# Patient Record
Sex: Female | Born: 1970
Health system: Southern US, Community
[De-identification: ages and names within clinical notes are randomized; demographics above are authoritative.]

## PROBLEM LIST (undated history)

## (undated) DIAGNOSIS — D649 Anemia, unspecified: Secondary | ICD-10-CM

## (undated) DIAGNOSIS — IMO0001 Reserved for inherently not codable concepts without codable children: Secondary | ICD-10-CM

## (undated) DIAGNOSIS — F419 Anxiety disorder, unspecified: Secondary | ICD-10-CM

## (undated) DIAGNOSIS — I10 Essential (primary) hypertension: Secondary | ICD-10-CM

## (undated) DIAGNOSIS — R011 Cardiac murmur, unspecified: Secondary | ICD-10-CM

## (undated) DIAGNOSIS — T7840XA Allergy, unspecified, initial encounter: Secondary | ICD-10-CM

## (undated) DIAGNOSIS — N809 Endometriosis, unspecified: Secondary | ICD-10-CM

## (undated) DIAGNOSIS — G709 Myoneural disorder, unspecified: Secondary | ICD-10-CM

## (undated) DIAGNOSIS — H9190 Unspecified hearing loss, unspecified ear: Secondary | ICD-10-CM

## (undated) DIAGNOSIS — R609 Edema, unspecified: Secondary | ICD-10-CM

## (undated) DIAGNOSIS — G4730 Sleep apnea, unspecified: Secondary | ICD-10-CM

## (undated) DIAGNOSIS — E785 Hyperlipidemia, unspecified: Secondary | ICD-10-CM

## (undated) DIAGNOSIS — E139 Other specified diabetes mellitus without complications: Secondary | ICD-10-CM

## (undated) DIAGNOSIS — G809 Cerebral palsy, unspecified: Secondary | ICD-10-CM

## (undated) DIAGNOSIS — F329 Major depressive disorder, single episode, unspecified: Secondary | ICD-10-CM

## (undated) DIAGNOSIS — C183 Malignant neoplasm of hepatic flexure: Secondary | ICD-10-CM

## (undated) DIAGNOSIS — F32A Depression, unspecified: Secondary | ICD-10-CM

## (undated) DIAGNOSIS — F259 Schizoaffective disorder, unspecified: Secondary | ICD-10-CM

## (undated) DIAGNOSIS — R0600 Dyspnea, unspecified: Secondary | ICD-10-CM

## (undated) DIAGNOSIS — E041 Nontoxic single thyroid nodule: Secondary | ICD-10-CM

## (undated) HISTORY — DX: Cerebral palsy, unspecified: G80.9

## (undated) HISTORY — DX: Allergy, unspecified, initial encounter: T78.40XA

## (undated) HISTORY — DX: Other specified diabetes mellitus without complications: E13.9

## (undated) HISTORY — DX: Unspecified hearing loss, unspecified ear: H91.90

## (undated) HISTORY — DX: Hyperlipidemia, unspecified: E78.5

## (undated) HISTORY — DX: Reserved for inherently not codable concepts without codable children: IMO0001

## (undated) HISTORY — DX: Endometriosis, unspecified: N80.9

## (undated) HISTORY — PX: EXPLORATORY LAPAROTOMY WITH ABDOMINAL MASS EXCISION: SHX5169

## (undated) HISTORY — DX: Essential (primary) hypertension: I10

---
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
## 1998-01-14 ENCOUNTER — Inpatient Hospital Stay (HOSPITAL_COMMUNITY): Admission: AD | Admit: 1998-01-14 | Discharge: 1998-01-14 | Payer: Self-pay | Admitting: *Deleted

## 1998-02-19 ENCOUNTER — Ambulatory Visit (HOSPITAL_COMMUNITY): Admission: RE | Admit: 1998-02-19 | Discharge: 1998-02-19 | Payer: Self-pay | Admitting: *Deleted

## 1998-03-19 ENCOUNTER — Ambulatory Visit (HOSPITAL_COMMUNITY): Admission: RE | Admit: 1998-03-19 | Discharge: 1998-03-19 | Payer: Self-pay | Admitting: *Deleted

## 1998-04-08 ENCOUNTER — Inpatient Hospital Stay (HOSPITAL_COMMUNITY): Admission: AD | Admit: 1998-04-08 | Discharge: 1998-04-08 | Payer: Self-pay | Admitting: *Deleted

## 1998-04-16 ENCOUNTER — Inpatient Hospital Stay (HOSPITAL_COMMUNITY): Admission: AD | Admit: 1998-04-16 | Discharge: 1998-04-16 | Payer: Self-pay | Admitting: *Deleted

## 1998-04-20 ENCOUNTER — Observation Stay (HOSPITAL_COMMUNITY): Admission: AD | Admit: 1998-04-20 | Discharge: 1998-04-20 | Payer: Self-pay | Admitting: *Deleted

## 1998-05-31 ENCOUNTER — Inpatient Hospital Stay (HOSPITAL_COMMUNITY): Admission: AD | Admit: 1998-05-31 | Discharge: 1998-05-31 | Payer: Self-pay | Admitting: *Deleted

## 1998-06-11 ENCOUNTER — Ambulatory Visit (HOSPITAL_COMMUNITY): Admission: RE | Admit: 1998-06-11 | Discharge: 1998-06-11 | Payer: Self-pay | Admitting: *Deleted

## 1998-08-01 ENCOUNTER — Inpatient Hospital Stay (HOSPITAL_COMMUNITY): Admission: AD | Admit: 1998-08-01 | Discharge: 1998-08-01 | Payer: Self-pay | Admitting: *Deleted

## 1998-09-15 ENCOUNTER — Inpatient Hospital Stay (HOSPITAL_COMMUNITY): Admission: AD | Admit: 1998-09-15 | Discharge: 1998-09-19 | Payer: Self-pay | Admitting: *Deleted

## 1998-10-27 ENCOUNTER — Inpatient Hospital Stay (HOSPITAL_COMMUNITY): Admission: AD | Admit: 1998-10-27 | Discharge: 1998-10-27 | Payer: Self-pay | Admitting: *Deleted

## 1998-12-16 ENCOUNTER — Emergency Department (HOSPITAL_COMMUNITY): Admission: EM | Admit: 1998-12-16 | Discharge: 1998-12-16 | Payer: Self-pay | Admitting: Emergency Medicine

## 1999-01-02 ENCOUNTER — Encounter: Admission: RE | Admit: 1999-01-02 | Discharge: 1999-02-04 | Payer: Self-pay | Admitting: Orthopedic Surgery

## 2001-02-07 ENCOUNTER — Ambulatory Visit (HOSPITAL_COMMUNITY): Admission: RE | Admit: 2001-02-07 | Discharge: 2001-02-07 | Payer: Self-pay | Admitting: Cardiology

## 2001-02-07 ENCOUNTER — Encounter: Payer: Self-pay | Admitting: Cardiology

## 2001-02-09 ENCOUNTER — Encounter: Payer: Self-pay | Admitting: Cardiology

## 2001-02-09 ENCOUNTER — Ambulatory Visit (HOSPITAL_COMMUNITY): Admission: RE | Admit: 2001-02-09 | Discharge: 2001-02-09 | Payer: Self-pay | Admitting: Cardiology

## 2001-11-29 ENCOUNTER — Ambulatory Visit (HOSPITAL_COMMUNITY): Admission: RE | Admit: 2001-11-29 | Discharge: 2001-11-29 | Payer: Self-pay | Admitting: Cardiology

## 2001-11-29 ENCOUNTER — Encounter: Payer: Self-pay | Admitting: Cardiology

## 2002-10-23 ENCOUNTER — Inpatient Hospital Stay (HOSPITAL_COMMUNITY): Admission: AD | Admit: 2002-10-23 | Discharge: 2002-10-23 | Payer: Self-pay | Admitting: *Deleted

## 2003-09-23 ENCOUNTER — Emergency Department (HOSPITAL_COMMUNITY): Admission: EM | Admit: 2003-09-23 | Discharge: 2003-09-23 | Payer: Self-pay | Admitting: *Deleted

## 2003-10-28 ENCOUNTER — Emergency Department (HOSPITAL_COMMUNITY): Admission: AD | Admit: 2003-10-28 | Discharge: 2003-10-28 | Payer: Self-pay | Admitting: Family Medicine

## 2004-11-17 ENCOUNTER — Ambulatory Visit: Payer: Self-pay | Admitting: Family Medicine

## 2004-11-24 ENCOUNTER — Ambulatory Visit: Payer: Self-pay | Admitting: Family Medicine

## 2004-12-01 ENCOUNTER — Ambulatory Visit: Payer: Self-pay | Admitting: Family Medicine

## 2005-01-12 ENCOUNTER — Encounter: Admission: RE | Admit: 2005-01-12 | Discharge: 2005-01-12 | Payer: Self-pay | Admitting: Otolaryngology

## 2005-02-04 ENCOUNTER — Ambulatory Visit: Payer: Self-pay | Admitting: Family Medicine

## 2005-02-08 ENCOUNTER — Ambulatory Visit: Payer: Self-pay | Admitting: Family Medicine

## 2005-12-07 ENCOUNTER — Ambulatory Visit: Payer: Self-pay | Admitting: Family Medicine

## 2006-01-17 ENCOUNTER — Ambulatory Visit: Payer: Self-pay | Admitting: Family Medicine

## 2006-01-19 ENCOUNTER — Ambulatory Visit (HOSPITAL_COMMUNITY): Admission: RE | Admit: 2006-01-19 | Discharge: 2006-01-19 | Payer: Self-pay | Admitting: Internal Medicine

## 2007-04-06 ENCOUNTER — Ambulatory Visit: Payer: Self-pay | Admitting: Internal Medicine

## 2007-04-18 ENCOUNTER — Encounter (INDEPENDENT_AMBULATORY_CARE_PROVIDER_SITE_OTHER): Payer: Self-pay | Admitting: Family Medicine

## 2007-04-18 ENCOUNTER — Ambulatory Visit: Payer: Self-pay | Admitting: Internal Medicine

## 2007-09-26 ENCOUNTER — Encounter (INDEPENDENT_AMBULATORY_CARE_PROVIDER_SITE_OTHER): Payer: Self-pay | Admitting: Family Medicine

## 2007-09-26 DIAGNOSIS — N809 Endometriosis, unspecified: Secondary | ICD-10-CM | POA: Insufficient documentation

## 2007-09-26 DIAGNOSIS — R011 Cardiac murmur, unspecified: Secondary | ICD-10-CM

## 2007-09-26 DIAGNOSIS — F8189 Other developmental disorders of scholastic skills: Secondary | ICD-10-CM

## 2007-09-26 DIAGNOSIS — N6019 Diffuse cystic mastopathy of unspecified breast: Secondary | ICD-10-CM

## 2008-07-15 ENCOUNTER — Other Ambulatory Visit: Admission: RE | Admit: 2008-07-15 | Discharge: 2008-07-15 | Payer: Self-pay | Admitting: Family Medicine

## 2008-07-15 ENCOUNTER — Encounter (INDEPENDENT_AMBULATORY_CARE_PROVIDER_SITE_OTHER): Payer: Self-pay | Admitting: Family Medicine

## 2008-07-15 ENCOUNTER — Ambulatory Visit: Payer: Self-pay | Admitting: Internal Medicine

## 2008-07-16 ENCOUNTER — Encounter (INDEPENDENT_AMBULATORY_CARE_PROVIDER_SITE_OTHER): Payer: Self-pay | Admitting: Internal Medicine

## 2009-09-02 ENCOUNTER — Telehealth (INDEPENDENT_AMBULATORY_CARE_PROVIDER_SITE_OTHER): Payer: Self-pay | Admitting: *Deleted

## 2009-09-16 ENCOUNTER — Encounter (INDEPENDENT_AMBULATORY_CARE_PROVIDER_SITE_OTHER): Payer: Self-pay | Admitting: Adult Health

## 2009-09-16 ENCOUNTER — Ambulatory Visit: Payer: Self-pay | Admitting: Internal Medicine

## 2009-09-16 LAB — CONVERTED CEMR LAB
ALT: 12 units/L (ref 0–35)
CO2: 25 meq/L (ref 19–32)
Calcium: 9.2 mg/dL (ref 8.4–10.5)
Chloride: 103 meq/L (ref 96–112)
Creatinine, Ser: 0.71 mg/dL (ref 0.40–1.20)
Eosinophils Absolute: 0.1 10*3/uL (ref 0.0–0.7)
Eosinophils Relative: 2 % (ref 0–5)
Glucose, Bld: 94 mg/dL (ref 70–99)
HCT: 41.7 % (ref 36.0–46.0)
Hemoglobin: 13.5 g/dL (ref 12.0–15.0)
Lymphs Abs: 2.1 10*3/uL (ref 0.7–4.0)
MCHC: 32.4 g/dL (ref 30.0–36.0)
MCV: 85.6 fL (ref 78.0–100.0)
Monocytes Absolute: 0.6 10*3/uL (ref 0.1–1.0)
Monocytes Relative: 10 % (ref 3–12)
Neutrophils Relative %: 55 % (ref 43–77)
RBC: 4.87 M/uL (ref 3.87–5.11)
TSH: 0.464 microintl units/mL (ref 0.350–4.500)
Total Protein: 7 g/dL (ref 6.0–8.3)
WBC: 6.3 10*3/uL (ref 4.0–10.5)

## 2009-10-14 ENCOUNTER — Encounter (INDEPENDENT_AMBULATORY_CARE_PROVIDER_SITE_OTHER): Payer: Self-pay | Admitting: Adult Health

## 2009-10-14 ENCOUNTER — Ambulatory Visit: Payer: Self-pay | Admitting: Internal Medicine

## 2009-10-14 ENCOUNTER — Other Ambulatory Visit: Admission: RE | Admit: 2009-10-14 | Discharge: 2009-10-14 | Payer: Self-pay | Admitting: Internal Medicine

## 2009-10-14 LAB — CONVERTED CEMR LAB
Albumin: 4.3 g/dL (ref 3.5–5.2)
BUN: 7 mg/dL (ref 6–23)
Calcium: 9.3 mg/dL (ref 8.4–10.5)
Chlamydia, DNA Probe: NEGATIVE
Chloride: 101 meq/L (ref 96–112)
Creatinine, Ser: 0.91 mg/dL (ref 0.40–1.20)
Glucose, Bld: 104 mg/dL — ABNORMAL HIGH (ref 70–99)
Potassium: 4.4 meq/L (ref 3.5–5.3)

## 2009-10-21 ENCOUNTER — Ambulatory Visit: Payer: Self-pay | Admitting: Family Medicine

## 2009-11-05 ENCOUNTER — Encounter (INDEPENDENT_AMBULATORY_CARE_PROVIDER_SITE_OTHER): Payer: Self-pay | Admitting: *Deleted

## 2009-11-21 ENCOUNTER — Encounter (INDEPENDENT_AMBULATORY_CARE_PROVIDER_SITE_OTHER): Payer: Self-pay | Admitting: Adult Health

## 2009-11-21 ENCOUNTER — Ambulatory Visit: Payer: Self-pay | Admitting: Internal Medicine

## 2009-11-21 LAB — CONVERTED CEMR LAB
ALT: 8 units/L (ref 0–35)
AST: 11 units/L (ref 0–37)
Albumin: 4 g/dL (ref 3.5–5.2)
Alkaline Phosphatase: 54 units/L (ref 39–117)
Hgb A1c MFr Bld: 6.3 % — ABNORMAL HIGH (ref 4.6–6.1)
LDL Cholesterol: 88 mg/dL (ref 0–99)
Potassium: 3.9 meq/L (ref 3.5–5.3)
Sodium: 139 meq/L (ref 135–145)
Total Protein: 6.6 g/dL (ref 6.0–8.3)

## 2009-12-03 ENCOUNTER — Ambulatory Visit (HOSPITAL_COMMUNITY): Admission: RE | Admit: 2009-12-03 | Discharge: 2009-12-03 | Payer: Self-pay | Admitting: Internal Medicine

## 2010-05-22 ENCOUNTER — Ambulatory Visit: Payer: Self-pay | Admitting: Internal Medicine

## 2010-05-22 ENCOUNTER — Encounter (INDEPENDENT_AMBULATORY_CARE_PROVIDER_SITE_OTHER): Payer: Self-pay | Admitting: Adult Health

## 2010-05-22 LAB — CONVERTED CEMR LAB
AST: 15 units/L (ref 0–37)
Albumin: 4 g/dL (ref 3.5–5.2)
Alkaline Phosphatase: 56 units/L (ref 39–117)
BUN: 8 mg/dL (ref 6–23)
HDL: 56 mg/dL (ref >39)
LDL Cholesterol: 95 mg/dL (ref 0–99)
Potassium: 4.3 meq/L (ref 3.5–5.3)
Sodium: 137 meq/L (ref 135–145)
Total Protein: 6.9 g/dL (ref 6.0–8.3)
Vit D, 25-Hydroxy: 31 ng/mL (ref 30–89)

## 2010-08-27 ENCOUNTER — Emergency Department (HOSPITAL_COMMUNITY): Admission: EM | Admit: 2010-08-27 | Discharge: 2010-08-27 | Payer: Self-pay | Admitting: Emergency Medicine

## 2010-08-29 ENCOUNTER — Other Ambulatory Visit: Payer: Self-pay | Admitting: Emergency Medicine

## 2010-08-30 ENCOUNTER — Other Ambulatory Visit: Payer: Self-pay | Admitting: Emergency Medicine

## 2010-08-30 ENCOUNTER — Inpatient Hospital Stay (HOSPITAL_COMMUNITY): Admission: RE | Admit: 2010-08-30 | Discharge: 2010-09-02 | Payer: Self-pay | Admitting: Psychiatry

## 2010-08-30 ENCOUNTER — Ambulatory Visit: Payer: Self-pay | Admitting: Psychiatry

## 2010-09-08 ENCOUNTER — Encounter (INDEPENDENT_AMBULATORY_CARE_PROVIDER_SITE_OTHER): Payer: Self-pay | Admitting: Internal Medicine

## 2010-09-08 LAB — CONVERTED CEMR LAB
Albumin: 4.3 g/dL (ref 3.5–5.2)
CO2: 24 meq/L (ref 19–32)
Calcium: 9.3 mg/dL (ref 8.4–10.5)
Chloride: 101 meq/L (ref 96–112)
Eosinophils Relative: 1 % (ref 0–5)
Glucose, Bld: 111 mg/dL — ABNORMAL HIGH (ref 70–99)
HCT: 43.1 % (ref 36.0–46.0)
Helicobacter Pylori Antibody-IgG: 0.5
Lipase: 10 units/L (ref 0–75)
Lymphocytes Relative: 37 % (ref 12–46)
Lymphs Abs: 0.9 10*3/uL (ref 0.7–4.0)
Platelets: 232 10*3/uL (ref 150–400)
Potassium: 4.3 meq/L (ref 3.5–5.3)
Sodium: 139 meq/L (ref 135–145)
Total Protein: 7.2 g/dL (ref 6.0–8.3)
WBC: 2.5 10*3/uL — ABNORMAL LOW (ref 4.0–10.5)

## 2010-09-09 ENCOUNTER — Encounter (INDEPENDENT_AMBULATORY_CARE_PROVIDER_SITE_OTHER): Payer: Self-pay | Admitting: Internal Medicine

## 2010-09-14 ENCOUNTER — Encounter (INDEPENDENT_AMBULATORY_CARE_PROVIDER_SITE_OTHER): Payer: Self-pay | Admitting: *Deleted

## 2010-09-14 LAB — CONVERTED CEMR LAB
Basophils Absolute: 0 10*3/uL (ref 0.0–0.1)
Basophils Relative: 1 % (ref 0–1)
Lymphocytes Relative: 38 % (ref 12–46)
Neutro Abs: 2.1 10*3/uL (ref 1.7–7.7)
Platelets: 191 10*3/uL (ref 150–400)
RDW: 13.2 % (ref 11.5–15.5)
Valproic Acid Lvl: 5.9 ug/mL — ABNORMAL LOW (ref 50.0–100.0)

## 2010-09-15 ENCOUNTER — Ambulatory Visit (HOSPITAL_COMMUNITY): Payer: Self-pay | Admitting: Psychiatry

## 2010-09-22 ENCOUNTER — Emergency Department (HOSPITAL_COMMUNITY): Admission: EM | Admit: 2010-09-22 | Discharge: 2010-09-22 | Payer: Self-pay | Admitting: Emergency Medicine

## 2010-09-23 ENCOUNTER — Other Ambulatory Visit: Payer: Self-pay | Admitting: Emergency Medicine

## 2010-09-23 ENCOUNTER — Inpatient Hospital Stay (HOSPITAL_COMMUNITY): Admission: EM | Admit: 2010-09-23 | Discharge: 2010-09-27 | Payer: Self-pay | Admitting: Psychiatry

## 2010-10-27 ENCOUNTER — Emergency Department (HOSPITAL_COMMUNITY)
Admission: EM | Admit: 2010-10-27 | Discharge: 2010-10-28 | Disposition: A | Discharge status: Other Acute Inpatient Hospital | Payer: Self-pay | Source: Home / Self Care | Admitting: Emergency Medicine

## 2010-10-28 ENCOUNTER — Inpatient Hospital Stay (HOSPITAL_COMMUNITY)
Admission: EM | Admit: 2010-10-28 | Discharge: 2010-11-02 | Payer: Self-pay | Source: Home / Self Care | Attending: Psychiatry | Admitting: Psychiatry

## 2010-11-02 ENCOUNTER — Ambulatory Visit (HOSPITAL_COMMUNITY): Payer: Self-pay | Admitting: Psychiatry

## 2010-11-19 ENCOUNTER — Ambulatory Visit (HOSPITAL_COMMUNITY)
Admission: RE | Admit: 2010-11-19 | Discharge: 2010-11-19 | Payer: Self-pay | Source: Home / Self Care | Attending: Psychiatry | Admitting: Psychiatry

## 2010-12-03 ENCOUNTER — Ambulatory Visit (HOSPITAL_COMMUNITY): Admit: 2010-12-03 | Payer: Self-pay | Admitting: Psychiatry

## 2010-12-04 ENCOUNTER — Emergency Department (HOSPITAL_COMMUNITY)
Admission: EM | Admit: 2010-12-04 | Discharge: 2010-12-07 | Disposition: A | Discharge status: Other Acute Inpatient Hospital | Payer: Self-pay | Source: Home / Self Care | Admitting: Emergency Medicine

## 2010-12-05 DIAGNOSIS — F259 Schizoaffective disorder, unspecified: Secondary | ICD-10-CM

## 2010-12-06 ENCOUNTER — Encounter: Payer: Self-pay | Admitting: Internal Medicine

## 2010-12-06 DIAGNOSIS — F259 Schizoaffective disorder, unspecified: Secondary | ICD-10-CM

## 2010-12-07 ENCOUNTER — Inpatient Hospital Stay (HOSPITAL_COMMUNITY)
Admission: AD | Admit: 2010-12-07 | Discharge: 2010-12-14 | Payer: Self-pay | Source: Home / Self Care | Attending: Psychiatry | Admitting: Psychiatry

## 2010-12-07 LAB — BASIC METABOLIC PANEL
CO2: 28 mEq/L (ref 19–32)
GFR calc Af Amer: >60 mL/min (ref >60)
GFR calc non Af Amer: >60 mL/min (ref >60)
Glucose, Bld: 111 mg/dL — ABNORMAL HIGH (ref 70–99)
Potassium: 3.3 mEq/L — ABNORMAL LOW (ref 3.5–5.1)
Sodium: 140 mEq/L (ref 135–145)

## 2010-12-07 LAB — CBC
MCHC: 35.8 g/dL (ref 30.0–36.0)
Platelets: 188 10*3/uL (ref 150–400)
RDW: 13.5 % (ref 11.5–15.5)
WBC: 4.1 10*3/uL (ref 4.0–10.5)

## 2010-12-07 LAB — HEPATIC FUNCTION PANEL
ALT: 10 U/L (ref 0–35)
Bilirubin, Direct: 0.1 mg/dL (ref 0.0–0.3)
Indirect Bilirubin: 0.5 mg/dL (ref 0.3–0.9)
Total Protein: 6.9 g/dL (ref 6.0–8.3)

## 2010-12-07 LAB — RAPID URINE DRUG SCREEN, HOSP PERFORMED
Cocaine: NOT DETECTED
Opiates: NOT DETECTED

## 2010-12-07 LAB — PREGNANCY, URINE: Preg Test, Ur: NEGATIVE

## 2010-12-07 LAB — CK TOTAL AND CKMB (NOT AT ARMC)
CK, MB: 1.1 ng/mL (ref 0.3–4.0)
Total CK: 86 U/L (ref 7–177)

## 2010-12-07 LAB — TROPONIN I: Troponin I: 0.01 ng/mL (ref 0.00–0.06)

## 2010-12-08 LAB — GLUCOSE, CAPILLARY
Glucose-Capillary: 144 mg/dL — ABNORMAL HIGH (ref 70–99)
Glucose-Capillary: 104 mg/dL — ABNORMAL HIGH (ref 70–99)
Glucose-Capillary: 139 mg/dL — ABNORMAL HIGH (ref 70–99)
Glucose-Capillary: 96 mg/dL (ref 70–99)
Glucose-Capillary: 101 mg/dL — ABNORMAL HIGH (ref 70–99)

## 2010-12-09 LAB — GLUCOSE, CAPILLARY
Glucose-Capillary: 113 mg/dL — ABNORMAL HIGH (ref 70–99)
Glucose-Capillary: 107 mg/dL — ABNORMAL HIGH (ref 70–99)
Glucose-Capillary: 123 mg/dL — ABNORMAL HIGH (ref 70–99)
Glucose-Capillary: 101 mg/dL — ABNORMAL HIGH (ref 70–99)
Glucose-Capillary: 119 mg/dL — ABNORMAL HIGH (ref 70–99)

## 2010-12-09 LAB — TSH: TSH: 0.763 u[IU]/mL (ref 0.350–4.500)
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
## 2010-12-10 ENCOUNTER — Ambulatory Visit (HOSPITAL_COMMUNITY): Admit: 2010-12-10 | Payer: Self-pay | Admitting: Psychiatry

## 2010-12-11 NOTE — H&P (Signed)
NAMEMarland Kitchen  [name], [name] NO.:  [id number]  MEDICAL RECORD NO.:  [id number]          PATIENT TYPE:  IPS  LOCATION:  0400                          [facility]ACILITY:  [facility]  PHYSICIAN:  Anselm Jungling, MD  DATE OF BIRTH:  13-Nov-1971  DATE OF ADMISSION:  12/07/2010 DATE OF DISCHARGE:                      PSYCHIATRIC ADMISSION ASSESSMENT   This is a 40 year old female voluntarily admitted on December 07, 2010.  HISTORY OF PRESENT ILLNESS:  The patient presents to the emergency department with auditory hallucinations telling her to hurt herself, having suicidal thoughts.  She states that her family is tormenting her. She mentions her uncle.  She states that they want to take her child away from her, that she is not fit.  The patient reports scratching herself in the emergency department using a fingernail.  She has not been sleeping well, has been off her medications.  States that she was unable to follow through the appointment and did not have the money to buy her medications.  PAST PSYCHIATRIC HISTORY:  The patient was here in December of 2011, discharged on December 19 with a diagnosis of schizoaffective disorder. She was to see Carollee Herter and go to the [facility] but did not realize she was to follow up for further medications.  SOCIAL HISTORY:  The patient is separated.  She has an 79 year old child and the child is currently residing with the patient's mother.  FAMILY HISTORY:  None.  ALCOHOL AND DRUG HISTORY:  Denies any alcohol or substance use.  PRIMARY CARE PROVIDER:  Dr. Clelia Croft at [facility].  MEDICAL PROBLEMS:  Listed are hypertension and diabetes.  MEDICATIONS: 1. The patient lists Zyrtec 10 mg daily. 2. Ambien 10 mg q.h.s. 3. Risperdal 2 mg q.h.s. 4. Metformin 500 mg b.i.d. 5. Lisinopril 5 mg daily. 6. Aspirin 81 mg daily. 7. Tylenol as needed. 8. Multivitamins daily. 9. Omeprazole 20 mg daily. 10.Vitamin B complex. 11.Vitamin B6 daily.  DRUG  ALLERGIES:  No known drug allergies.  Physical examination was done in the emergency department.  The physical exam was reviewed.  Of note, the patient presented agitated, anxious, hostile, psychotic and tearful.  Her physical exam, however, was within normal limits.  She does have a Telfa to her left arm from where she states she scratched herself.  No obvious bleeding or any erythema noted around the wound.  Laboratory data shows a CBC within normal limits.  Urine drug screen is negative.  Potassium 3.3.  Alcohol level less than 5.  Urine pregnancy test is negative.  MENTAL STATUS EXAM:  The patient is resting in bed.  She is disheveled. She is currently wearing hospital scrubs.  Her speech is clear.  She has good eye contact.  Her mood is anxious and depressed.  The patient does appear to be having some mild anxiety.  Does not appear to be overtly guarded or suspicious.  Thought processes endorsing auditory hallucinations.  Does not appear to be actively responding.  She does seem well aware of her self, place and situation.  DIAGNOSES:  AXIS I:  Schizoaffective disorder. AXIS II:  Deferred. AXIS III:  History of hypertension, diabetes. AXIS IV:  Other  psychosocial problems related to burden of illness, possible separation from her child. AXIS V:  Current is 30.  PLAN:  Our plan is to continue with her Risperdal.  We will initially order 1 mg q.h.s. and have 0.5 available on a p.r.n. basis as well as Ambien for sleep.  Will continue with her antihypertensive, check her blood sugar twice a day, continue to assess her stressors and her support and returning to prior living situation.  Her tentative length of stay at this time is 3 to 5 days.     Landry Corporal, N.P.   ______________________________ Anselm Jungling, MD    JO/MEDQ  D:  12/07/2010  T:  12/07/2010  Job:  [id number]  Electronically Signed by Limmie PatriciaP. on 12/08/2010 02:12:32 PM Electronically Signed  by Geralyn Flash MD on 12/09/2010 08:46:31 AM

## 2010-12-13 LAB — GLUCOSE, CAPILLARY: Glucose-Capillary: 111 mg/dL — ABNORMAL HIGH (ref 70–99)

## 2010-12-14 ENCOUNTER — Emergency Department (HOSPITAL_COMMUNITY)
Admission: EM | Admit: 2010-12-14 | Discharge: 2010-12-14 | Disposition: A | Discharge status: Other Acute Inpatient Hospital | Payer: Self-pay | Source: Home / Self Care | Admitting: Emergency Medicine

## 2010-12-14 ENCOUNTER — Inpatient Hospital Stay (HOSPITAL_COMMUNITY)
Admission: EM | Admit: 2010-12-14 | Discharge: 2010-12-18 | DRG: 885 | Disposition: A | Discharge status: Home / Self Care | Payer: Medicare Other | Attending: Psychiatry | Admitting: Psychiatry

## 2010-12-14 DIAGNOSIS — I10 Essential (primary) hypertension: Secondary | ICD-10-CM

## 2010-12-14 DIAGNOSIS — K219 Gastro-esophageal reflux disease without esophagitis: Secondary | ICD-10-CM

## 2010-12-14 DIAGNOSIS — Z598 Other problems related to housing and economic circumstances: Secondary | ICD-10-CM

## 2010-12-14 DIAGNOSIS — R7309 Other abnormal glucose: Secondary | ICD-10-CM

## 2010-12-14 DIAGNOSIS — Z638 Other specified problems related to primary support group: Secondary | ICD-10-CM

## 2010-12-14 DIAGNOSIS — F259 Schizoaffective disorder, unspecified: Principal | ICD-10-CM

## 2010-12-14 DIAGNOSIS — E669 Obesity, unspecified: Secondary | ICD-10-CM

## 2010-12-14 LAB — RAPID URINE DRUG SCREEN, HOSP PERFORMED
Amphetamines: NOT DETECTED
Barbiturates: NOT DETECTED
Benzodiazepines: NOT DETECTED
Cocaine: NOT DETECTED

## 2010-12-15 LAB — GLUCOSE, CAPILLARY
Glucose-Capillary: 105 mg/dL — ABNORMAL HIGH (ref 70–99)
Glucose-Capillary: 96 mg/dL (ref 70–99)
Glucose-Capillary: 100 mg/dL — ABNORMAL HIGH (ref 70–99)
Glucose-Capillary: 135 mg/dL — ABNORMAL HIGH (ref 70–99)
Glucose-Capillary: 125 mg/dL — ABNORMAL HIGH (ref 70–99)

## 2010-12-16 LAB — GLUCOSE, CAPILLARY: Glucose-Capillary: 102 mg/dL — ABNORMAL HIGH (ref 70–99)

## 2010-12-17 DIAGNOSIS — F29 Unspecified psychosis not due to a substance or known physiological condition: Secondary | ICD-10-CM

## 2010-12-21 NOTE — H&P (Addendum)
NAMEMarland Kitchen  SECRET, KRISTENSEN NO.:  [id number]  MEDICAL RECORD NO.:  [id number]          PATIENT TYPE:  IPS  LOCATION:  0403                          [facility]ACILITY:  [facility]  PHYSICIAN:  Anselm Jungling, MD  DATE OF BIRTH:  10-24-1971  DATE OF ADMISSION:  12/14/2010 DATE OF DISCHARGE:                      PSYCHIATRIC ADMISSION ASSESSMENT   IDENTIFYING INFORMATION:  The patient is a 40 year old African American female, readmitted to the hospital for onset of psychosis with hallucinations.  The patient was discharged on the morning December 14, 2010, left the hospital with her mother, returned to her apartment, and then voiced having auditory hallucinations telling her to harm herself. On the way to the hospital, Khristin attempted to jump out of the car and while in the emergency room [facility] also attempted to leave, but was easily redirected.  PAST HISTORY:  The patient has had a number of admissions.  This is her fourth in the last 6 months.  SOCIAL HISTORY:  She lives in [address] housing, unemployed, has a 50- year-old son.  Nonsmoker, nondrinker.  FAMILY HISTORY:  Significant for no substance abuse.  She is supported by her mother and her aunt.  No history of substance abuse, physical, or emotional abuse.  ALCOHOL AND DRUG HISTORY:  Negative.  MEDICAL HISTORY:  The patient has a history of obesity, type 2 diabetes, gastroesophageal reflux disease, and insomnia.  PRIMARY CARE PHYSICIAN:  [facility].  CURRENT PSYCHIATRIST:  [facility].  CURRENT MEDICATIONS: 1. Lisinopril 5 mg p.o. q.a.m. 2. Iron 27 mg multivitamin p.o. daily. 3. Loratadine 10 mg p.o. daily. 4. Lorazepam 1 mg p.o. daily. 5. Metformin 500 mg p.o. daily--that has been held. 6. Omeprazole 20 mg p.o. daily. 7. Risperidone 2 mg. 8. Zolpidem 10 mg for sleep.  ALLERGIES:  None.  POSITIVE PHYSICAL FINDINGS:  Please refer to the physical exam provided in the emergency  room.  SIGNIFICANT LABORATORIES:  Include a negative pregnancy test.  Negative urine drug screen done in the emergency room.  No other labs.  MENTAL STATUS EXAMINATION:  The patient is alert, oriented, and tearful, clearly responding to internal stimuli.  She is disheveled, wearing hospital scrubs.  She is slightly disheveled.  Speech is normal rate and rhythm, normal volume.  Again, the patient is disorganized in her thought process.  Mood is depressed and tearful.  Cognition:  The patient denies visual hallucinations, but does hear voices, somewhat agitated and easily labile.  ASSESSMENT:  Axis I:  Psychosis, not otherwise specified. Axis II:  Deferred. Axis III:  Hypertension, obesity, gastroesophageal reflux disease, and insomnia. Axis IV:  Burden of chronic mental illness, housing problems, and relational problems with psychosocial family issues. Axis V:  Current global assessment of functioning 40, last year difficult to assess.  PLAN:  Admit for stabilization and treatment.  Estimated length of stay 3 to 5 days.    ______________________________ Verne Spurr, PA   ______________________________ Anselm Jungling, MD    NM/MEDQ  D:  12/15/2010  T:  12/15/2010  Job:  [id number]  Electronically Signed by Geralyn Flash MD on 12/21/2010 10:51:06 AM Electronically Signed by Verne Spurr  on 12/24/2010  10:33:44 AM
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
## 2010-12-25 ENCOUNTER — Emergency Department (HOSPITAL_COMMUNITY)
Admission: EM | Admit: 2010-12-25 | Discharge: 2010-12-28 | Disposition: A | Discharge status: Other Acute Inpatient Hospital | Payer: Medicare Other | Attending: Emergency Medicine | Admitting: Emergency Medicine

## 2010-12-25 ENCOUNTER — Ambulatory Visit (HOSPITAL_COMMUNITY)
Admission: RE | Admit: 2010-12-25 | Discharge: 2010-12-25 | Disposition: A | Discharge status: Home / Self Care | Payer: Medicare Other | Source: Other Acute Inpatient Hospital | Attending: Psychiatry | Admitting: Psychiatry

## 2010-12-25 DIAGNOSIS — R45851 Suicidal ideations: Secondary | ICD-10-CM | POA: Insufficient documentation

## 2010-12-25 DIAGNOSIS — R079 Chest pain, unspecified: Secondary | ICD-10-CM | POA: Insufficient documentation

## 2010-12-25 DIAGNOSIS — F259 Schizoaffective disorder, unspecified: Secondary | ICD-10-CM | POA: Insufficient documentation

## 2010-12-25 DIAGNOSIS — E119 Type 2 diabetes mellitus without complications: Secondary | ICD-10-CM | POA: Insufficient documentation

## 2010-12-25 DIAGNOSIS — J9819 Other pulmonary collapse: Secondary | ICD-10-CM | POA: Insufficient documentation

## 2010-12-25 DIAGNOSIS — I10 Essential (primary) hypertension: Secondary | ICD-10-CM | POA: Insufficient documentation

## 2010-12-25 DIAGNOSIS — R51 Headache: Secondary | ICD-10-CM | POA: Insufficient documentation

## 2010-12-25 LAB — COMPREHENSIVE METABOLIC PANEL
Albumin: 3.8 g/dL (ref 3.5–5.2)
Alkaline Phosphatase: 63 U/L (ref 39–117)
BUN: 5 mg/dL — ABNORMAL LOW (ref 6–23)
Creatinine, Ser: 0.69 mg/dL (ref 0.4–1.2)
Potassium: 3.5 mEq/L (ref 3.5–5.1)
Total Protein: 7.2 g/dL (ref 6.0–8.3)

## 2010-12-25 LAB — DIFFERENTIAL
Basophils Absolute: 0 10*3/uL (ref 0.0–0.1)
Eosinophils Absolute: 0.1 10*3/uL (ref 0.0–0.7)
Eosinophils Relative: 1 % (ref 0–5)
Lymphs Abs: 1.5 10*3/uL (ref 0.7–4.0)

## 2010-12-25 LAB — CBC
MCV: 84.2 fL (ref 78.0–100.0)
Platelets: 226 10*3/uL (ref 150–400)
RDW: 13.2 % (ref 11.5–15.5)
WBC: 5.5 10*3/uL (ref 4.0–10.5)

## 2010-12-25 LAB — PREGNANCY, URINE: Preg Test, Ur: NEGATIVE

## 2010-12-25 LAB — RAPID URINE DRUG SCREEN, HOSP PERFORMED
Amphetamines: NOT DETECTED
Benzodiazepines: NOT DETECTED
Cocaine: NOT DETECTED
Opiates: NOT DETECTED
Tetrahydrocannabinol: NOT DETECTED

## 2010-12-25 LAB — ACETAMINOPHEN LEVEL: Acetaminophen (Tylenol), Serum: <10 ug/mL — ABNORMAL LOW (ref 10–30)

## 2010-12-25 LAB — SALICYLATE LEVEL: Salicylate Lvl: <4 mg/dL (ref 2.8–20.0)

## 2010-12-25 NOTE — Discharge Summary (Signed)
NAMEMarland Kitchen  [name], [name] NO.:  [id number]  MEDICAL RECORD NO.:  [id number]          PATIENT TYPE:  IPS  LOCATION:  0404                          FACILITY:  [facility]  PHYSICIAN:  Anselm Jungling, MD  DATE OF BIRTH:  1971-08-10  DATE OF ADMISSION:  12/07/2010 DATE OF DISCHARGE:  12/14/2010                              DISCHARGE SUMMARY   IDENTIFYING DATA AND REASON FOR ADMISSION:  This was one of many [facility] admissions for [name], a 40 year old, unmarried, African American female, who was admitted again because of increasing depression and suicide risk.  Please refer to the admission note for further details pertaining to the symptoms, circumstances, and history that led to her hospitalization.  She was given an initial Axis I diagnosis of major depressive disorder, recurrent, rule out schizoaffective disorder.  MEDICAL AND LABORATORY:  The patient was medically and physically assessed by the psychiatric nurse practitioner.  She came to [place] with a history of seasonal allergies, GERD, and hypertension.  She was continued on her usual Prilosec, Zyrtec, lisinopril, and vitamins, as well as aspirin 81 mg daily.  There were no significant medical issues during her stay.  HOSPITAL COURSE:  The patient was admitted to the adult inpatient service.  She presented as a mildly obese but normally-developed African American female who initially was quite sad, depressed, indicating that she had not been feeling safe at home.  She talked of being tired of being harassed by her family.  Although she admitted to having had suicidal thoughts, she expressed a strong desire for help.  The patient was continued on a regimen of risperidone which was titrated upward to a final dose of 0.5 mg b.i.d. and 2 mg q.h.s.  Ambien was utilized on a p.r.n. basis for sleep.  The patient's suicide risk factors were considered carefully by the treatment team and the team formulated a plan for the  patient to be required to be in the public areas of the inpatient unit at all times other than for sleeping, toileting, and bathing.  The patient was cooperative with this.  The patient's mood brightened rather quickly.  By the 3rd hospital day, she was indicating that she was feeling significantly better and she in fact did appear much less anxious and in much better spirits.  She indicated that she felt very confident about her ability to remain stable with respect to mood if discharged home.  Patient worked with case management and the undersigned towards an aftercare plan that was shared with the patient's family, specifically her mother.  Although the patient initially appeared appropriate for discharge on December 11, 2010, her mother, unfortunately, was unable to come retrieve the patient and take her home due to her own illness.  This led to a delay in the patient's discharge until December 14, 2010, which was unavoidable because of the importance of mother as the primary support for this individual.  The patient was discharged on December 14, 2010.  Prior to her discharge, case management spoke with the patient's mother on the phone and discussed the fact of the patient's ongoing potential level for repeat suicide gestures given her  history of having done so, so frequently in the past.  Warning signs of increased suicide risk were discussed with the mother.  Mother reported that there were no guns or firearms in the home.  Mother was apprised as to steps to take should the patient again appear to be in crisis.  Patient and her mother agreed to the following aftercare plan.  AFTERCARE:  The patient was to follow up at the [facility] with an appointment on December 15, 2010, at 10:30 a.m.  Also, she was referred to the [facility] Team owing to the frequency of her hospital stays.  DISCHARGE MEDICATIONS: 1.  Risperdal 0.5 mg b.i.d. and 2 mg q.h.s. 2. Ambien 10 mg h.s. p.r.n. insomnia 3. Aspirin 81 mg daily. 4. Multivitamin daily. 5. Prilosec 20 mg daily. 6. Vitamin B plus C complex daily. 7. Vitamin B6 daily. 8. Zyrtec 10 mg daily. 9. Lisinopril 5 mg daily.  DISCHARGE DIAGNOSES:  AXIS I:  Major depressive disorder, recurrent, possible schizoaffective disorder. AXIS II:  Deferred. AXIS III:  History of hypertension, gastroesophageal reflux disease, seasonal allergies. AXIS IV:  Stressors, severe. AXIS V:  Global Assessment of Functioning on discharge 50.     Anselm Jungling, MD     SPB/MEDQ  D:  12/22/2010  T:  12/22/2010  Job:  [id number]  Electronically Signed by Geralyn Flash MD on 12/25/2010 08:53:20 AM

## 2010-12-26 ENCOUNTER — Emergency Department (HOSPITAL_COMMUNITY): Payer: Medicare Other

## 2010-12-26 DIAGNOSIS — F259 Schizoaffective disorder, unspecified: Secondary | ICD-10-CM

## 2010-12-26 LAB — GLUCOSE, CAPILLARY: Glucose-Capillary: 97 mg/dL (ref 70–99)

## 2010-12-27 DIAGNOSIS — F259 Schizoaffective disorder, unspecified: Secondary | ICD-10-CM

## 2010-12-27 LAB — GLUCOSE, CAPILLARY: Glucose-Capillary: 98 mg/dL (ref 70–99)

## 2010-12-28 DIAGNOSIS — F259 Schizoaffective disorder, unspecified: Secondary | ICD-10-CM

## 2011-01-25 LAB — GLUCOSE, CAPILLARY
Glucose-Capillary: 115 mg/dL — ABNORMAL HIGH (ref 70–99)
Glucose-Capillary: 138 mg/dL — ABNORMAL HIGH (ref 70–99)
Glucose-Capillary: 85 mg/dL (ref 70–99)
Glucose-Capillary: 94 mg/dL (ref 70–99)
Glucose-Capillary: 98 mg/dL (ref 70–99)

## 2011-01-26 LAB — RAPID URINE DRUG SCREEN, HOSP PERFORMED
Amphetamines: NOT DETECTED
Barbiturates: NOT DETECTED
Barbiturates: NOT DETECTED
Benzodiazepines: POSITIVE — AB
Benzodiazepines: NOT DETECTED
Cocaine: NOT DETECTED
Cocaine: NOT DETECTED
Opiates: NOT DETECTED

## 2011-01-26 LAB — DIFFERENTIAL
Basophils Absolute: 0 10*3/uL (ref 0.0–0.1)
Basophils Relative: 0 % (ref 0–1)
Basophils Relative: 1 % (ref 0–1)
Eosinophils Absolute: 0 10*3/uL (ref 0.0–0.7)
Eosinophils Absolute: 0 10*3/uL (ref 0.0–0.7)
Eosinophils Relative: 0 % (ref 0–5)
Eosinophils Relative: 0 % (ref 0–5)
Lymphs Abs: 1.3 10*3/uL (ref 0.7–4.0)
Lymphs Abs: 1.5 10*3/uL (ref 0.7–4.0)
Neutrophils Relative %: 67 % (ref 43–77)
Neutrophils Relative %: 64 % (ref 43–77)

## 2011-01-26 LAB — POCT CARDIAC MARKERS: Troponin i, poc: <0.05 ng/mL (ref 0.00–0.09)

## 2011-01-26 LAB — POCT PREGNANCY, URINE: Preg Test, Ur: NEGATIVE

## 2011-01-26 LAB — GLUCOSE, CAPILLARY
Glucose-Capillary: 104 mg/dL — ABNORMAL HIGH (ref 70–99)
Glucose-Capillary: 101 mg/dL — ABNORMAL HIGH (ref 70–99)
Glucose-Capillary: 104 mg/dL — ABNORMAL HIGH (ref 70–99)
Glucose-Capillary: 108 mg/dL — ABNORMAL HIGH (ref 70–99)
Glucose-Capillary: 108 mg/dL — ABNORMAL HIGH (ref 70–99)
Glucose-Capillary: 98 mg/dL (ref 70–99)
Glucose-Capillary: 98 mg/dL (ref 70–99)

## 2011-01-26 LAB — COMPREHENSIVE METABOLIC PANEL
ALT: 16 U/L (ref 0–35)
CO2: 27 mEq/L (ref 19–32)
Calcium: 9.6 mg/dL (ref 8.4–10.5)
Chloride: 103 mEq/L (ref 96–112)
Creatinine, Ser: 0.71 mg/dL (ref 0.4–1.2)
GFR calc non Af Amer: >60 mL/min (ref >60)
Glucose, Bld: 96 mg/dL (ref 70–99)
Sodium: 140 mEq/L (ref 135–145)
Total Bilirubin: 0.5 mg/dL (ref 0.3–1.2)

## 2011-01-26 LAB — CBC
HCT: 37 % (ref 36.0–46.0)
Hemoglobin: 13 g/dL (ref 12.0–15.0)
MCH: 29.6 pg (ref 26.0–34.0)
MCHC: 35.1 g/dL (ref 30.0–36.0)
MCHC: 33.8 g/dL (ref 30.0–36.0)
MCV: 83.7 fL (ref 78.0–100.0)
MCV: 87.6 fL (ref 78.0–100.0)
Platelets: 240 10*3/uL (ref 150–400)
RBC: 4.41 MIL/uL (ref 3.87–5.11)
RDW: 13.8 % (ref 11.5–15.5)

## 2011-01-26 LAB — BASIC METABOLIC PANEL
BUN: 3 mg/dL — ABNORMAL LOW (ref 6–23)
CO2: 28 mEq/L (ref 19–32)
Calcium: 9.2 mg/dL (ref 8.4–10.5)
Chloride: 104 mEq/L (ref 96–112)
Creatinine, Ser: 0.72 mg/dL (ref 0.4–1.2)
Glucose, Bld: 115 mg/dL — ABNORMAL HIGH (ref 70–99)

## 2011-01-27 LAB — CBC
HCT: 39.3 % (ref 36.0–46.0)
Hemoglobin: 13.2 g/dL (ref 12.0–15.0)
MCH: 29.6 pg (ref 26.0–34.0)
MCV: 88.4 fL (ref 78.0–100.0)
Platelets: 208 10*3/uL (ref 150–400)
RBC: 4.45 MIL/uL (ref 3.87–5.11)
WBC: 6.5 10*3/uL (ref 4.0–10.5)

## 2011-01-27 LAB — DIFFERENTIAL
Basophils Absolute: 0.1 10*3/uL (ref 0.0–0.1)
Eosinophils Absolute: 0 10*3/uL (ref 0.0–0.7)
Lymphocytes Relative: 10 % — ABNORMAL LOW (ref 12–46)
Neutro Abs: 5.2 10*3/uL (ref 1.7–7.7)

## 2011-01-27 LAB — GLUCOSE, CAPILLARY
Glucose-Capillary: 103 mg/dL — ABNORMAL HIGH (ref 70–99)
Glucose-Capillary: 103 mg/dL — ABNORMAL HIGH (ref 70–99)
Glucose-Capillary: 103 mg/dL — ABNORMAL HIGH (ref 70–99)
Glucose-Capillary: 95 mg/dL (ref 70–99)

## 2011-01-27 LAB — URINE CULTURE: Colony Count: >=100000

## 2011-01-27 LAB — COMPREHENSIVE METABOLIC PANEL
Albumin: 3.4 g/dL — ABNORMAL LOW (ref 3.5–5.2)
Alkaline Phosphatase: 55 U/L (ref 39–117)
BUN: 5 mg/dL — ABNORMAL LOW (ref 6–23)
CO2: 28 mEq/L (ref 19–32)
Chloride: 107 mEq/L (ref 96–112)
GFR calc non Af Amer: >60 mL/min (ref >60)
Glucose, Bld: 127 mg/dL — ABNORMAL HIGH (ref 70–99)
Potassium: 2.7 mEq/L — CL (ref 3.5–5.1)
Total Bilirubin: 0.3 mg/dL (ref 0.3–1.2)

## 2011-01-27 LAB — URINE MICROSCOPIC-ADD ON

## 2011-01-27 LAB — RAPID URINE DRUG SCREEN, HOSP PERFORMED
Barbiturates: NOT DETECTED
Opiates: NOT DETECTED

## 2011-01-27 LAB — URINALYSIS, ROUTINE W REFLEX MICROSCOPIC
Bilirubin Urine: NEGATIVE
Ketones, ur: 15 mg/dL — AB
Specific Gravity, Urine: 1.013 (ref 1.005–1.030)
pH: 7 (ref 5.0–8.0)

## 2011-01-27 LAB — POCT PREGNANCY, URINE: Preg Test, Ur: NEGATIVE

## 2011-01-28 LAB — POCT I-STAT, CHEM 8
BUN: <3 mg/dL — ABNORMAL LOW (ref 6–23)
Chloride: 101 mEq/L (ref 96–112)
Potassium: 2.8 mEq/L — ABNORMAL LOW (ref 3.5–5.1)
Sodium: 140 mEq/L (ref 135–145)
TCO2: 25 mmol/L (ref 0–100)

## 2011-01-28 LAB — POCT PREGNANCY, URINE: Preg Test, Ur: NEGATIVE

## 2011-02-25 ENCOUNTER — Emergency Department (HOSPITAL_COMMUNITY)
Admission: EM | Admit: 2011-02-25 | Discharge: 2011-02-26 | Disposition: A | Discharge status: Home / Self Care | Payer: Medicare Other | Attending: Emergency Medicine | Admitting: Emergency Medicine

## 2011-02-25 DIAGNOSIS — F329 Major depressive disorder, single episode, unspecified: Secondary | ICD-10-CM | POA: Insufficient documentation

## 2011-02-25 DIAGNOSIS — F3289 Other specified depressive episodes: Secondary | ICD-10-CM | POA: Insufficient documentation

## 2011-02-25 DIAGNOSIS — E119 Type 2 diabetes mellitus without complications: Secondary | ICD-10-CM | POA: Insufficient documentation

## 2011-02-25 DIAGNOSIS — I10 Essential (primary) hypertension: Secondary | ICD-10-CM | POA: Insufficient documentation

## 2011-02-25 DIAGNOSIS — F209 Schizophrenia, unspecified: Secondary | ICD-10-CM | POA: Insufficient documentation

## 2011-02-25 LAB — POCT PREGNANCY, URINE: Preg Test, Ur: NEGATIVE

## 2011-02-25 LAB — CBC
MCV: 84.6 fL (ref 78.0–100.0)
Platelets: 195 10*3/uL (ref 150–400)
RBC: 4.36 MIL/uL (ref 3.87–5.11)
WBC: 4.6 10*3/uL (ref 4.0–10.5)

## 2011-02-25 LAB — COMPREHENSIVE METABOLIC PANEL
BUN: 7 mg/dL (ref 6–23)
CO2: 30 mEq/L (ref 19–32)
Chloride: 104 mEq/L (ref 96–112)
Creatinine, Ser: 0.69 mg/dL (ref 0.4–1.2)
GFR calc non Af Amer: >60 mL/min (ref >60)
Glucose, Bld: 112 mg/dL — ABNORMAL HIGH (ref 70–99)
Total Bilirubin: 0.4 mg/dL (ref 0.3–1.2)

## 2011-02-25 LAB — GLUCOSE, CAPILLARY: Glucose-Capillary: 115 mg/dL — ABNORMAL HIGH (ref 70–99)

## 2011-02-25 LAB — URINALYSIS, ROUTINE W REFLEX MICROSCOPIC
Bilirubin Urine: NEGATIVE
Glucose, UA: NEGATIVE mg/dL
Hgb urine dipstick: NEGATIVE
Ketones, ur: NEGATIVE mg/dL
Protein, ur: NEGATIVE mg/dL

## 2011-02-25 LAB — DIFFERENTIAL
Eosinophils Absolute: 0.1 10*3/uL (ref 0.0–0.7)
Lymphocytes Relative: 38 % (ref 12–46)
Lymphs Abs: 1.8 10*3/uL (ref 0.7–4.0)
Neutrophils Relative %: 48 % (ref 43–77)

## 2011-02-25 LAB — RAPID URINE DRUG SCREEN, HOSP PERFORMED
Benzodiazepines: NOT DETECTED
Cocaine: NOT DETECTED

## 2011-02-25 LAB — VALPROIC ACID LEVEL: Valproic Acid Lvl: 39.7 ug/mL — ABNORMAL LOW (ref 50.0–100.0)

## 2011-03-17 ENCOUNTER — Ambulatory Visit (HOSPITAL_COMMUNITY): Payer: Medicare Other | Admitting: Physician Assistant

## 2011-03-17 ENCOUNTER — Encounter (HOSPITAL_COMMUNITY): Payer: Medicare Other | Admitting: Physician Assistant

## 2011-04-14 ENCOUNTER — Encounter (HOSPITAL_COMMUNITY): Payer: Medicare Other | Admitting: Physician Assistant

## 2011-04-14 DIAGNOSIS — F259 Schizoaffective disorder, unspecified: Secondary | ICD-10-CM

## 2011-06-09 ENCOUNTER — Encounter (INDEPENDENT_AMBULATORY_CARE_PROVIDER_SITE_OTHER): Payer: Medicare Other | Admitting: Psychiatry

## 2011-06-09 DIAGNOSIS — F259 Schizoaffective disorder, unspecified: Secondary | ICD-10-CM

## 2011-06-10 NOTE — Progress Notes (Signed)
[name], [name]            ACCOUNT NO.:  [id number]  MEDICAL RECORD NO.:  [id number]  LOCATION:  [facility]                           FACILITY[facility]  [facility]  PHYSICIAN:  Jaysin Gayler T. Keaghan Staton, M.D.   DATE OF BIRTH:  1971-09-16                                PROGRESS NOTE  Date; 06/09/11 The patient came in today for her followup appointment.  She was last seen by our PA on May 31.  She has been compliant with her medications which are venlafaxine, Depakote and Risperdal.  The patient has a history of schizoaffective disorder.  She has at least four psychiatric admissions due to decompensation of her illness.  Her chronic stressor is financial reasons, limited support from her ex-husband since the marriage ended last October and living situation as the patient's mother is also living with her.  The patient reported that she has been taking her medication on a regular basis and reported no side effects. Recently she is complaining of some foot pain and leg swelling which she is unsure what is causing it.  Patient told since she was released from the hospital she has been not hearing any voices or feeling paranoid, feels her medicine is making her calm, relaxed and she had been sleeping good.  SOCIAL HISTORY: The patient lives with her 56 year old son and her mother also lives with them too.  MEDICAL HISTORY: The patient has history of the diabetes mellitus, cerebral palsy by history causing hearing loss, dyslipidemia.  The patient sees Dr. Daphine Deutscher at [facility].  CURRENT MEDICATIONS: 1. Metformin 500 mg twice a day. 2. Lisinopril 5 mg in the morning. 3. Risperdal 3 mg at bedtime. 4. Loratadine 10 mg at bedtime. 5. Depakote ER 500 mg the morning and 1000 at bedtime. 6. Effexor extended release 75 mg one in the morning and two at     bedtime.  VITALS: Blood pressure 112/68, heart rate 89, weight 205.6 pounds, height 5 feet 2 inches.  MENTAL STATUS EXAM: The  patient is a moderately obese [place] female who is somewhat anxious and at times difficult to hear but overall engaged in conversation.  She maintained a good eye contact.  His speech is slow but soft, clear and coherent.  Her thought process was logical, linear, goal-directed.  There were no paranoia delusions or obsessions noted. Her attention and concentration were distracted at times but overall she was alert and oriented x3.  She denies any auditory hallucinations, suicidal thoughts or homicidal thoughts.  Her insight, judgment, and impulse control were okay.  There were no extrapyramidal side-effects noted and her fund of knowledge was okay.  DIAGNOSIS: Axis I:  Schizoaffective disorder. Axis II:  Deferred. Axis III:  See medical history. Axis IV:  Moderate. Axis V:  60-65  PLAN: We will continue her current medications which is Risperdal 3 mg at bedtime, Depakote 1500 mg daily and venlafaxine 225 mg daily.  I have explained the risks and benefits of medication in detail.  She is scheduled to see her primary care doctor for her bilateral leg swelling and pain.  I have recommended to have lab tests including Depakote level, hemoglobin A1c, CBC and CMP.  I explained the risks and benefits of medication in detail.  The patient is excited as she is getting a hearing aid in the next few weeks.  She is also seeing and attending the program at [facility] on a regular basis.  Time spent 30 minutes.  I will see her again in 4-6 weeks.     [name] T. Lolly Mustache, M.D.     STA/MEDQ  D:  06/09/2011  T:  06/10/2011  Job:  [id number]  Electronically Signed by Kathryne Sharper M.D. on 06/10/2011 04:08:50 PM

## 2011-06-28 ENCOUNTER — Emergency Department (HOSPITAL_COMMUNITY)
Admission: EM | Admit: 2011-06-28 | Discharge: 2011-06-28 | Disposition: A | Discharge status: Home / Self Care | Payer: Medicare Other | Attending: Emergency Medicine | Admitting: Emergency Medicine

## 2011-06-28 ENCOUNTER — Emergency Department (HOSPITAL_COMMUNITY): Payer: Medicare Other

## 2011-06-28 DIAGNOSIS — E119 Type 2 diabetes mellitus without complications: Secondary | ICD-10-CM | POA: Insufficient documentation

## 2011-06-28 DIAGNOSIS — I517 Cardiomegaly: Secondary | ICD-10-CM | POA: Insufficient documentation

## 2011-06-28 DIAGNOSIS — R0602 Shortness of breath: Secondary | ICD-10-CM | POA: Insufficient documentation

## 2011-06-28 DIAGNOSIS — I10 Essential (primary) hypertension: Secondary | ICD-10-CM | POA: Insufficient documentation

## 2011-06-28 DIAGNOSIS — M7989 Other specified soft tissue disorders: Secondary | ICD-10-CM | POA: Insufficient documentation

## 2011-06-28 DIAGNOSIS — F319 Bipolar disorder, unspecified: Secondary | ICD-10-CM | POA: Insufficient documentation

## 2011-06-28 LAB — URINALYSIS, ROUTINE W REFLEX MICROSCOPIC
Glucose, UA: NEGATIVE mg/dL
Leukocytes, UA: NEGATIVE
Protein, ur: NEGATIVE mg/dL
Specific Gravity, Urine: 1.014 (ref 1.005–1.030)
Urobilinogen, UA: 0.2 mg/dL (ref 0.0–1.0)

## 2011-06-28 LAB — CBC
HCT: 35.5 % — ABNORMAL LOW (ref 36.0–46.0)
MCH: 30.4 pg (ref 26.0–34.0)
MCV: 86.4 fL (ref 78.0–100.0)
Platelets: 168 10*3/uL (ref 150–400)
RDW: 14.5 % (ref 11.5–15.5)

## 2011-06-28 LAB — BASIC METABOLIC PANEL
GFR calc non Af Amer: >60 mL/min (ref >60)
Glucose, Bld: 95 mg/dL (ref 70–99)

## 2011-06-29 ENCOUNTER — Ambulatory Visit (HOSPITAL_COMMUNITY)
Admission: RE | Admit: 2011-06-29 | Discharge: 2011-06-29 | Disposition: A | Discharge status: Home / Self Care | Payer: Medicare Other | Source: Ambulatory Visit | Attending: Emergency Medicine | Admitting: Emergency Medicine

## 2011-06-29 DIAGNOSIS — F3289 Other specified depressive episodes: Secondary | ICD-10-CM | POA: Insufficient documentation

## 2011-06-29 DIAGNOSIS — M7989 Other specified soft tissue disorders: Secondary | ICD-10-CM

## 2011-06-29 DIAGNOSIS — E119 Type 2 diabetes mellitus without complications: Secondary | ICD-10-CM | POA: Insufficient documentation

## 2011-06-29 DIAGNOSIS — I10 Essential (primary) hypertension: Secondary | ICD-10-CM | POA: Insufficient documentation

## 2011-06-29 DIAGNOSIS — F329 Major depressive disorder, single episode, unspecified: Secondary | ICD-10-CM | POA: Insufficient documentation

## 2011-07-20 ENCOUNTER — Other Ambulatory Visit (HOSPITAL_COMMUNITY): Payer: Self-pay | Admitting: Family Medicine

## 2011-07-20 DIAGNOSIS — Z1231 Encounter for screening mammogram for malignant neoplasm of breast: Secondary | ICD-10-CM

## 2011-07-21 ENCOUNTER — Encounter (INDEPENDENT_AMBULATORY_CARE_PROVIDER_SITE_OTHER): Payer: Medicare Other | Admitting: Psychiatry

## 2011-07-21 DIAGNOSIS — F259 Schizoaffective disorder, unspecified: Secondary | ICD-10-CM

## 2011-07-23 NOTE — Progress Notes (Signed)
  [name], [name]            ACCOUNT NO.:  [id number]  MEDICAL RECORD NO.:  [id number]  LOCATION:  [facility]                           FACILITY[facility]  [facility]  PHYSICIAN:  Malic Rosten T. Abhiraj Dozal, M.D.   DATE OF BIRTH:  [birth year]                                PROGRESS NOTE   The patient came in today for her followup appointment.  She came today with her mother for her appointment.  She was last seen on June 10, 2011.  She had drawn blood work, but the results are still pending. Recently she is saw the medical doctor, Dr. Daphine Deutscher, for her leg swelling and foot pain.  She was prescribed Lasix.  However, she does not feel her swelling is reduced.  Mother is concerned about her weight and leg swelling, though she is also scheduled to see her medical doctor for an appointment.  She also had an ultrasound of her leg to rule out any blood clot.  The patient also endorsed sleep issue, and Mother admitted that sometimes she snores in the night and has difficulty breathing. However, her psychiatric illness is under control.  There have been no recent episodes of anxiety, paranoia, delusions or voices.  CURRENT MEDICATIONS: 1. Metformin 500 mg twice a day. 2. Lisinopril 5 mg in the morning. 3. Risperdal 3 mg at bedtime. 4. Loratadine 10 mg at bedtime. 5. Depakote ER 500 mg in the morning and 1000 at bedtime. 6. Effexor 75 mg in the morning and 150 at bedtime.  VITALS: Blood pressure 115/70, heart rate 90, weight 210 pounds.  MENTAL STATUS EXAMINATION: The patient is a moderately obese [place] female who is somewhat anxious but overall engaged in conversation.  She denies any auditory hallucinations, suicidal thoughts or homicidal thoughts.  Her speech is soft but clear and coherent.  Her thought process is also logical and linear.  There were no delusions or paranoia noticed.  There were no extrapyramidal side effects, though patient mentioned that sometimes, she does have tremors.  Her  insight, judgment and impulse control were okay.  DIAGNOSES: Axis I:  Schizoaffective disorder. Axis II:  Deferred. Axis III:  See medical history. Axis IV:  Moderate.  PLAN: I talked to the patient and her mother to have a sleep study done, as it appears that she has snore in the nighttime and is having breathing issues.  We will also follow up on the Depakote level and all the blood work which was done on her last visit, though I have recommended to reduce the Depakote to 1000 mg if that helps her leg swelling and weight reduction.  We will also start low-dose Cogentin 0.5 mg at bedtime to target those tremors which the patient complained about.  I explained the risks and benefits of the medication in detail.  We will follow up on all the labs.  Patient is scheduled to see Verne Spurr in 3-4 weeks.     Marlise Fahr T. Lolly Mustache, M.D.     STA/MEDQ  D:  07/21/2011  T:  07/21/2011  Job:  [id number]  Electronically Signed by Kathryne Sharper M.D. on 07/23/2011 09:11:18 AM

## 2011-07-26 ENCOUNTER — Ambulatory Visit (HOSPITAL_COMMUNITY): Payer: Medicare Other

## 2011-07-28 ENCOUNTER — Ambulatory Visit (HOSPITAL_COMMUNITY): Admission: RE | Admit: 2011-07-28 | Payer: Medicare Other | Source: Ambulatory Visit

## 2011-08-19 ENCOUNTER — Ambulatory Visit (HOSPITAL_COMMUNITY)
Admission: RE | Admit: 2011-08-19 | Discharge: 2011-08-19 | Disposition: A | Discharge status: Home / Self Care | Payer: Medicare Other | Source: Ambulatory Visit | Attending: Family Medicine | Admitting: Family Medicine

## 2011-08-19 ENCOUNTER — Encounter (INDEPENDENT_AMBULATORY_CARE_PROVIDER_SITE_OTHER): Payer: Medicare Other | Admitting: Physician Assistant

## 2011-08-19 DIAGNOSIS — F329 Major depressive disorder, single episode, unspecified: Secondary | ICD-10-CM

## 2011-08-19 DIAGNOSIS — Z803 Family history of malignant neoplasm of breast: Secondary | ICD-10-CM

## 2011-08-19 DIAGNOSIS — Z1231 Encounter for screening mammogram for malignant neoplasm of breast: Secondary | ICD-10-CM | POA: Insufficient documentation

## 2011-08-25 ENCOUNTER — Other Ambulatory Visit: Payer: Self-pay | Admitting: Family Medicine

## 2011-08-25 DIAGNOSIS — R928 Other abnormal and inconclusive findings on diagnostic imaging of breast: Secondary | ICD-10-CM

## 2011-09-16 ENCOUNTER — Encounter (HOSPITAL_COMMUNITY): Payer: Self-pay | Admitting: Physician Assistant

## 2011-09-16 ENCOUNTER — Ambulatory Visit (INDEPENDENT_AMBULATORY_CARE_PROVIDER_SITE_OTHER): Payer: Medicare Other | Admitting: Physician Assistant

## 2011-09-16 DIAGNOSIS — F259 Schizoaffective disorder, unspecified: Secondary | ICD-10-CM

## 2011-09-16 NOTE — Progress Notes (Signed)
[name] comes in today to follow up on her medication management for schizoaffective disorder.  She is accompanied by her mother.  Chayce states that she is doing a little better since the Depakote was recently reduced and her mother states that she would like for [name] to be completely off due to the increase in her weight.  The patient has recently seen her PCP Dr. Viann Shove who feels that the Depakote is the reason for her peripheral edema as well as the cause of the proptosis seen on the last office visit.  She has also seen her eye doctor and was told she did need glasses.   She is still attending [facility] each day and now has a personal therapist who comes to the home for individual sessions quite often.  Berenize notes that this has helped a great deal. She and her mother both state that there are a few problems at home with her 38 year old son who will be 13 tomorrow.  He is acting out in what sounds like very typical adolescent behaviors for his age.  The mental status exam is normal today.  [name] is alert and oriented x3.  She is casually dressed and very cooperative.  She makes good eye contact and her speech is clear. She denies SI/HI. There is no evidence of psychosis, no AH/VH.  She is neither delusional or paranoid.  Her thought process is normal and linear.  Her thought content is normal.  Her judgement and insight are intact.

## 2011-09-16 NOTE — Patient Instructions (Signed)
[name] will continue the depakote ER 500mg  two tablets at HS for the next month.  Then I will reduce it to 1 tablet at hs. If she is stable. Rx is written for Depakote ER 500mg  2 tabs at hs. #60/1RF. She will also continue the Effexor XR 75mg . 3 caps in the morning. #90/1RF. She will also continue the Risperdal 3mg , 1 cap at hs. #30/1RF. The patient is also encouraged to walk daily and get some aerobic exercise as often as possible. She will follow up in 6-8 weeks.

## 2011-09-20 ENCOUNTER — Ambulatory Visit
Admission: RE | Admit: 2011-09-20 | Discharge: 2011-09-20 | Disposition: A | Discharge status: Home / Self Care | Payer: Medicare Other | Source: Ambulatory Visit | Attending: Family Medicine | Admitting: Family Medicine

## 2011-09-20 DIAGNOSIS — R928 Other abnormal and inconclusive findings on diagnostic imaging of breast: Secondary | ICD-10-CM

## 2011-10-18 ENCOUNTER — Ambulatory Visit (INDEPENDENT_AMBULATORY_CARE_PROVIDER_SITE_OTHER): Payer: Medicare Other | Admitting: Physician Assistant

## 2011-10-18 DIAGNOSIS — F259 Schizoaffective disorder, unspecified: Secondary | ICD-10-CM

## 2011-10-18 MED ORDER — VENLAFAXINE HCL ER 75 MG PO CP24: ORAL_CAPSULE | ORAL | Status: DC

## 2011-10-18 MED ORDER — DIVALPROEX SODIUM ER 500 MG PO TB24: 500.0000 mg | ORAL_TABLET | Freq: Every day | ORAL | Status: DC

## 2011-10-18 MED ORDER — VENLAFAXINE HCL 75 MG PO TABS: 75.0000 mg | ORAL_TABLET | Freq: Two times a day (BID) | ORAL | Status: DC

## 2011-10-18 MED ORDER — RISPERIDONE 2 MG PO TABS: 2.0000 mg | ORAL_TABLET | Freq: Two times a day (BID) | ORAL | Status: DC

## 2011-10-18 MED ORDER — BENZTROPINE MESYLATE 0.5 MG PO TABS: 0.5000 mg | ORAL_TABLET | Freq: Two times a day (BID) | ORAL | Status: DC

## 2011-10-18 NOTE — Progress Notes (Signed)
   [facility] Follow-up Outpatient Visit  [name] 06-30-1971  Date: 10/18/11   Subjective: [name] is complaining that her ankles and legs are swollen. She states that the Depakote and the Risperdal are responsible for this. She reports that her Depakote has been decreased from 1500 mg daily to 1000 mg daily, and she would like it further decreased. She reports that she is having no psychotic symptoms, and denies any suicidal or homicidal ideation. Her sleep and appetite are good.  There were no vitals filed for this visit.  Mental Status Examination  Appearance: Well groomed and casually dressed. She has 2+ pitting edema to mid shin. Alert: Yes Attention: good  Cooperative: Yes Eye Contact: Good Speech: Clear and even Psychomotor Activity: Normal Memory/Concentration: Memory is mildly impaired. Her concentration is intact. Oriented: person, place, time/date and situation Mood: Euthymic Affect: Congruent Thought Processes and Associations: Logical Fund of Knowledge: Poor Thought Content:  Insight: Poor Judgement: Fair  Diagnosis: Schizoaffective disorder  Treatment Plan: We will decrease both her Depakote and Risperdal and followup in approximately one month.  Braelynn Lupton, PA

## 2011-11-11 ENCOUNTER — Encounter (HOSPITAL_COMMUNITY): Payer: Medicare Other | Admitting: Physician Assistant

## 2011-11-22 ENCOUNTER — Ambulatory Visit (HOSPITAL_COMMUNITY): Payer: Medicare Other | Admitting: Physician Assistant

## 2011-11-24 ENCOUNTER — Other Ambulatory Visit: Payer: Self-pay | Admitting: Family Medicine

## 2011-11-24 DIAGNOSIS — N912 Amenorrhea, unspecified: Secondary | ICD-10-CM

## 2011-11-26 ENCOUNTER — Ambulatory Visit
Admission: RE | Admit: 2011-11-26 | Discharge: 2011-11-26 | Disposition: A | Discharge status: Home / Self Care | Payer: Medicare Other | Source: Ambulatory Visit | Attending: Family Medicine | Admitting: Family Medicine

## 2011-11-26 DIAGNOSIS — N912 Amenorrhea, unspecified: Secondary | ICD-10-CM

## 2011-12-23 ENCOUNTER — Ambulatory Visit (HOSPITAL_COMMUNITY): Payer: Medicare Other | Admitting: Physician Assistant

## 2012-01-03 ENCOUNTER — Other Ambulatory Visit (HOSPITAL_COMMUNITY): Payer: Self-pay | Admitting: Psychiatry

## 2012-01-03 ENCOUNTER — Other Ambulatory Visit (HOSPITAL_COMMUNITY): Payer: Self-pay | Admitting: *Deleted

## 2012-01-03 MED ORDER — BENZTROPINE MESYLATE 0.5 MG PO TABS: 0.5000 mg | ORAL_TABLET | Freq: Two times a day (BID) | ORAL | Status: DC

## 2012-01-03 MED ORDER — DIVALPROEX SODIUM ER 500 MG PO TB24: 500.0000 mg | ORAL_TABLET | Freq: Every day | ORAL | Status: DC

## 2012-01-06 ENCOUNTER — Ambulatory Visit (INDEPENDENT_AMBULATORY_CARE_PROVIDER_SITE_OTHER): Payer: Medicare Other | Admitting: Physician Assistant

## 2012-01-06 DIAGNOSIS — F259 Schizoaffective disorder, unspecified: Secondary | ICD-10-CM

## 2012-01-06 MED ORDER — RISPERIDONE 2 MG PO TABS: 2.0000 mg | ORAL_TABLET | Freq: Every day | ORAL | Status: DC

## 2012-01-06 NOTE — Progress Notes (Signed)
   [facility] Follow-up Outpatient Visit  [name] [birth year]  Date: 01/06/12   Subjective: [name] presents today to followup on her medications for her schizoaffective disorder. At her last appointment we decreased her Depakote to 500 mg at bedtime and Risperdal to 2 mg at bedtime. She reports that that has gone well, and that her mood has been stable. She denies any depression, suicidal or homicidal ideation, or auditory or visual hallucinations. She reports that at times she does not sleep as well as at other times, but typically it only takes her about 15 minutes to fall asleep and she sleeps about 8-9 hours per night. She reports that her blood pressure has been elevated and her primary care doctor has started her on HCTZ.  There were no vitals filed for this visit.  Mental Status Examination  Appearance: Well groomed and dressed Alert: Yes Attention: good  Cooperative: Yes Eye Contact: Good Speech: Clear and even except for a mild speech impediment Psychomotor Activity: Normal Memory/Concentration: Intact Oriented: person, place, time/date and situation Mood: Anxious and Euthymic Affect: Appropriate Thought Processes and Associations: Coherent and Logical Fund of Knowledge: Good Thought Content:  Insight: Fair Judgement: Fair  Diagnosis: Schizoaffective disorder  Treatment Plan: We will continue her medications as follows: Risperdal 2 mg at bedtime, Effexor XR 75 mg daily, Depakote ER 500 mg at bedtime, and Cogentin 0.5 mg at bedtime. She will followup in one month at which time we will reassess for hypertension and consider changing the Effexor to Cymbalta or another antidepressant medication.  Daysean Tinkham, PA

## 2012-02-03 ENCOUNTER — Ambulatory Visit (INDEPENDENT_AMBULATORY_CARE_PROVIDER_SITE_OTHER): Payer: Medicare Other | Admitting: Physician Assistant

## 2012-02-03 DIAGNOSIS — F259 Schizoaffective disorder, unspecified: Secondary | ICD-10-CM

## 2012-02-03 MED ORDER — VENLAFAXINE HCL ER 75 MG PO CP24: ORAL_CAPSULE | ORAL | Status: DC

## 2012-02-03 MED ORDER — DIVALPROEX SODIUM ER 500 MG PO TB24: 500.0000 mg | ORAL_TABLET | Freq: Every day | ORAL | Status: DC

## 2012-02-03 MED ORDER — RISPERIDONE 2 MG PO TABS: 2.0000 mg | ORAL_TABLET | Freq: Every day | ORAL | Status: DC

## 2012-02-04 NOTE — Progress Notes (Signed)
   [facility] Behavioral Health Follow-up Outpatient Visit  [name] 02-Dec-1970  Date: 02/03/12   Subjective: Number leak presents today to follow up on her medications prescribed for schizoaffective disorder.at her last visit we decreased her dose of Depakote and Risperdal do to her developing lower extremity edema. She reports that her mood has been stable, and she has had no psychotic symptoms such as auditory or visual hallucinations. She reports that her edema has resolved. She does express some difficulty in losing weight, and reports that she is walking approximately one hour per day. She brought in to the office with her a 20 ounce nondiet soda, which this Clinical research associate confronted her about. She denies any suicidal or homicidal ideation, nor any auditory or visual hallucinations.  There were no vitals filed for this visit.  Mental Status Examination  Appearance: Well groomed and casually dressed Alert: Yes Attention: good  Cooperative: Yes Eye Contact: Good Speech: Clear and even Psychomotor Activity: Normal Memory/Concentration: Intact Oriented: person, place, time/date and situation Mood: Euthymic Affect: Congruent Thought Processes and Associations: Goal Directed and Linear Fund of Knowledge: Good Thought Content: Normal Insight: Fair Judgement: Fair  Diagnosis: Schizoaffective disorder  Treatment Plan: We will continue her Risperdal at 2 mg at bedtime, as well as Depakote ER 500 mg at bedtime.she has been counseled on reducing her calorie intake by choosing lower calorie foods such as fish and chicken rather than beef and pork, as well as making complex carbohydrates such as fruits and vegetables the mainstay of her diet. She has been counseled to avoid simple carbohydrates such as breads pastas and sweets. She will followup in one month.  Tykeem Lanzer, PA-C

## 2012-03-24 ENCOUNTER — Emergency Department (HOSPITAL_COMMUNITY)
Admission: EM | Admit: 2012-03-24 | Discharge: 2012-03-24 | Disposition: A | Discharge status: Home / Self Care | Payer: Medicare Other | Attending: Emergency Medicine | Admitting: Emergency Medicine

## 2012-03-24 ENCOUNTER — Encounter (HOSPITAL_COMMUNITY): Payer: Self-pay

## 2012-03-24 DIAGNOSIS — E119 Type 2 diabetes mellitus without complications: Secondary | ICD-10-CM | POA: Insufficient documentation

## 2012-03-24 DIAGNOSIS — Z79899 Other long term (current) drug therapy: Secondary | ICD-10-CM | POA: Insufficient documentation

## 2012-03-24 DIAGNOSIS — I10 Essential (primary) hypertension: Secondary | ICD-10-CM | POA: Insufficient documentation

## 2012-03-24 DIAGNOSIS — G809 Cerebral palsy, unspecified: Secondary | ICD-10-CM | POA: Insufficient documentation

## 2012-03-24 DIAGNOSIS — F259 Schizoaffective disorder, unspecified: Secondary | ICD-10-CM | POA: Insufficient documentation

## 2012-03-24 HISTORY — DX: Depression, unspecified: F32.A

## 2012-03-24 HISTORY — DX: Major depressive disorder, single episode, unspecified: F32.9

## 2012-03-24 LAB — CBC
MCH: 28.9 pg (ref 26.0–34.0)
MCHC: 34.2 g/dL (ref 30.0–36.0)
MCV: 84.6 fL (ref 78.0–100.0)
Platelets: 211 10*3/uL (ref 150–400)

## 2012-03-24 LAB — COMPREHENSIVE METABOLIC PANEL
ALT: 48 U/L — ABNORMAL HIGH (ref 0–35)
AST: 45 U/L — ABNORMAL HIGH (ref 0–37)
Calcium: 9.7 mg/dL (ref 8.4–10.5)
Creatinine, Ser: 0.65 mg/dL (ref 0.50–1.10)
GFR calc Af Amer: >90 mL/min (ref >90)
GFR calc non Af Amer: >90 mL/min (ref >90)
Glucose, Bld: 158 mg/dL — ABNORMAL HIGH (ref 70–99)
Sodium: 136 mEq/L (ref 135–145)
Total Protein: 7.2 g/dL (ref 6.0–8.3)

## 2012-03-24 LAB — RAPID URINE DRUG SCREEN, HOSP PERFORMED
Barbiturates: NOT DETECTED
Benzodiazepines: NOT DETECTED
Cocaine: NOT DETECTED
Opiates: NOT DETECTED

## 2012-03-24 LAB — GLUCOSE, CAPILLARY: Glucose-Capillary: 168 mg/dL — ABNORMAL HIGH (ref 70–99)

## 2012-03-24 MED ORDER — DIVALPROEX SODIUM ER 500 MG PO TB24
500.0000 mg | ORAL_TABLET | Freq: Every day | ORAL | Status: DC
  Filled 2012-03-24: qty 1

## 2012-03-24 MED ORDER — ACETAMINOPHEN 325 MG PO TABS: 650.0000 mg | ORAL_TABLET | ORAL | Status: DC | PRN

## 2012-03-24 MED ORDER — BENZTROPINE MESYLATE 1 MG PO TABS: 0.5000 mg | ORAL_TABLET | Freq: Two times a day (BID) | ORAL | Status: DC

## 2012-03-24 MED ORDER — ONDANSETRON HCL 4 MG PO TABS: 4.0000 mg | ORAL_TABLET | Freq: Three times a day (TID) | ORAL | Status: DC | PRN

## 2012-03-24 MED ORDER — METFORMIN HCL 500 MG PO TABS
500.0000 mg | ORAL_TABLET | Freq: Two times a day (BID) | ORAL | Status: DC
  Administered 2012-03-24: 500 mg via ORAL
  Filled 2012-03-24 (×3): qty 1

## 2012-03-24 MED ORDER — LISINOPRIL 10 MG PO TABS
10.0000 mg | ORAL_TABLET | Freq: Every day | ORAL | Status: DC
  Filled 2012-03-24: qty 1

## 2012-03-24 MED ORDER — RISPERIDONE 2 MG PO TABS: 2.0000 mg | ORAL_TABLET | Freq: Every day | ORAL | Status: DC

## 2012-03-24 MED ORDER — VENLAFAXINE HCL ER 75 MG PO CP24
75.0000 mg | ORAL_CAPSULE | Freq: Every day | ORAL | Status: DC
  Administered 2012-03-24: 75 mg via ORAL
  Filled 2012-03-24 (×2): qty 1

## 2012-03-24 MED ORDER — IBUPROFEN 600 MG PO TABS: 600.0000 mg | ORAL_TABLET | Freq: Three times a day (TID) | ORAL | Status: DC | PRN

## 2012-03-24 NOTE — ED Notes (Signed)
Pt here with [employer] IVC, family states that she has threatened to kill her mother and her son, pt is crying in triage and states that these accusations aren't true.

## 2012-03-24 NOTE — Discharge Planning (Signed)
Telepsych physician called back advising that patient does not appear to be in a psychotic state and he is recommending discharge.  Advised telepsych physican to fax report so it can be presented to the Emergency Department Physician.  Pending recpt of consult.  Marlaine Hind ANN S , MSW, LCSWA 03/24/2012 8:04 AM [phone]

## 2012-03-24 NOTE — ED Notes (Signed)
Pt talking to psychiatrist via telepsych.  Lillia Abed NT at bedside.

## 2012-03-24 NOTE — Discharge Instructions (Signed)
Follow up with your doctor/therapist in the next few days. Return to ER if worse, new symptoms, severe anger or anxiety, depression, thoughts of harm to self or others, other concern. Your blood pressure is high today - follow up with primary care doctor in coming week.      Schizoaffective Disorder Schizoaffective disorder is a condition with a combination of features of schizophrenia and a mood disorder. There are symptoms of a psychotic disorder such as distorted thinking, hallucinations or delusions (schizophrenia feature) and depression or mania (mood disorder feature). There are 2 subtypes based on the mood component:   Bipolar Type.   Depressive Type.  CAUSES  Schizoaffective disorder, like schizophrenia, appears to have distinct genetic links. It is unknown as to what exactly causes the disorder. Some experts believe it involves brain chemistry such as an imbalance of serotonin and dopamine in the brain. These naturally occurring chemicals help relay electronic signals in the brain and help regulate mood. Other experts have speculated whether fetal exposure to toxins or viral illness, or even birth complications may play a role. However, there is no proof of this relationship. SYMPTOMS  Symptoms are those of both schizophrenia and mood disorders. These may include:  Hearing, seeing, or feeling things that are not there (hallucinations).   False beliefs (delusions).   Not taking care of oneself (for example, not bathing or grooming).   Speaking in a way that makes no sense to others.   Withdrawing or feeling isolated from other people.   Thoughts that race from one idea to the next.   Feelings of sadness, guilt, hopelessness, and anxiety.   Feelings of being excessively happy, powerful, or energetic.   Feeling drained of energy.   Losing or gaining weight.   Being unable to concentrate.   Sleeping more or less than normal.  DIAGNOSIS  The diagnosis is made when the  patient has features of both illnesses. The patient does not strictly have schizophrenia or a mood disorder alone. Unfortunately, determining if a patient has 2 separate illnesses (schizophrenia or a mood disorder), a combination of illnesses (schizophrenia and a mood disorder), or perhaps even a separate illness apart from schizophrenia or a mood disorder is difficult. An accurate diagnosis has 3 key parts:  The patient clearly has a major depressive disorder or mania.   They meet the criteria for schizophrenia (distorted thinking, hallucinations or delusions).   The patient must have psychosis for at least 2 weeks without a mood disorder.  The diagnosis of this disorder frequently requires several evaluations. Caregivers will evaluate the patient's symptoms over a length of time. The diagnosis can be made when the following are observed:  An uninterrupted period of mental illness (possibly lasting weeks or months) occurs during which a major depressive episode, a manic episode (both are mood disorder components), or a mixed episode occurs with symptoms of schizophrenia. A major depressive episode must include a depressed mood. Also:   This uninterrupted period of illness includes a minimum 2 week episode of active distorted thinking, hallucinations or delusions (schizophrenia feature).   During this same 2 week (or greater) period, the patient has no mood changes of depression or mania.   Outside of the specific 2 week period noted above, mood episodes are present for a big part of the total active and lingering periods of illness.   The disturbance is not due to the direct effects of a substance (such as street drugs or prescription medications) or a general medical condition.  The bipolar type is diagnosed if the disturbance includes a period of mania, a major depressive episode or a mixed episode.   The depressive type is diagnosed if the problem includes only major depressive episodes.    TREATMENT  Treatment usually includes a combination of medications and counseling. The exact regimen varies depending on the type and severity of symptoms. It also depends on whether the disorder is a depressive-type or bipolar-type. Medications are prescribed to help with psychotic symptoms, stabilize mood and treat depression. Psychotherapy can help curb distorted thoughts, teach appropriate social skills and decrease social isolation. Medications may include:  Antipsychotics. These are also called neuroleptics. These are prescribed to help with psychotic symptoms such as delusions, paranoia and hallucinations. Common medications in this category include clozapine (Clozaril), risperidone (Risperdal) and olanzapine (Zyprexa).   Mood-stabilizing medications. When the schizoaffective disorder is bipolar-type, mood stabilizers can level out the highs and lows of bipolar disorder, also known as manic depression. People with bipolar disorder have episodes of mania and depressed mood. Examples of mood stabilizers include lithium (Eskalith, Lithobid) and divalproex (Depakote).   Antidepressants. When depression is the underlying mood disorder, antidepressants can help with feelings of sadness, hopelessness, or difficulty with sleep and concentration. Common medications include citalopram (Celexa), fluoxetine and escitalopram (Lexapro).  Non-medication treatment may include:  Psychotherapy and counseling. Building a trusting relationship in therapy can help people with schizoaffective disorder better understand their condition and feel hopeful about their future. Effective sessions focus on real-life plans, problems, and relationships. Skills and behaviors specific to the home or workplace may be discussed.   Family or group therapy. Treatment can be more effective when people with schizoaffective disorder can discuss their problems with others. Supportive group settings can help decrease social isolation  and provide a reality check during periods of psychosis.  In general, people with schizoaffective disorder have a better prognosis than people with schizophrenia, but not as good as people with mood disorders only. Long-term treatment is necessary The prognosis varies from person to person. HOME CARE INSTRUCTIONS   Take your medicine as prescribed, even when you are feeling and thinking well.   Participate in individual and group counseling as recommended by your caregiver.  SEEK MEDICAL CARE IF:   You feel that you are experiencing side effects from prescription medications.   Contact your caregiver if you feel that symptoms are worsening despite using medication as prescribed and participating in counseling/group therapy sessions.  SEEK IMMEDIATE MEDICAL CARE IF:  You feel an urge to hurt yourself or are thinking about committing suicide. This is an emergency and you should be evaluated immediately. MAKE SURE YOU:   Understand these instructions.   Will watch your condition.   Will get help right away if you are not doing well or get worse.  Document Released: 03/14/2007 Document Revised: 10/21/2011 Document Reviewed: 05/15/2007 [facility] Patient Information 2012 [place], [place].      Hypertension As your heart beats, it forces blood through your arteries. This force is your blood pressure. If the pressure is too high, it is called hypertension (HTN) or high blood pressure. HTN is dangerous because you may have it and not know it. High blood pressure may mean that your heart has to work harder to pump blood. Your arteries may be narrow or stiff. The extra work puts you at risk for heart disease, stroke, and other problems.  Blood pressure consists of two numbers, a higher number over a lower, 110/72, for example. It is stated  as "110 over 72." The ideal is below 120 for the top number (systolic) and under 80 for the bottom (diastolic). Write down your blood pressure today. You should  pay close attention to your blood pressure if you have certain conditions such as:  Heart failure.   Prior heart attack.   Diabetes   Chronic kidney disease.   Prior stroke.   Multiple risk factors for heart disease.  To see if you have HTN, your blood pressure should be measured while you are seated with your arm held at the level of the heart. It should be measured at least twice. A one-time elevated blood pressure reading (especially in the Emergency Department) does not mean that you need treatment. There may be conditions in which the blood pressure is different between your right and left arms. It is important to see your caregiver soon for a recheck. Most people have essential hypertension which means that there is not a specific cause. This type of high blood pressure may be lowered by changing lifestyle factors such as:  Stress.   Smoking.   Lack of exercise.   Excessive weight.   Drug/tobacco/alcohol use.   Eating less salt.  Most people do not have symptoms from high blood pressure until it has caused damage to the body. Effective treatment can often prevent, delay or reduce that damage. TREATMENT  When a cause has been identified, treatment for high blood pressure is directed at the cause. There are a large number of medications to treat HTN. These fall into several categories, and your caregiver will help you select the medicines that are best for you. Medications may have side effects. You should review side effects with your caregiver. If your blood pressure stays high after you have made lifestyle changes or started on medicines,   Your medication(s) may need to be changed.   Other problems may need to be addressed.   Be certain you understand your prescriptions, and know how and when to take your medicine.   Be sure to follow up with your caregiver within the time frame advised (usually within two weeks) to have your blood pressure rechecked and to review your  medications.   If you are taking more than one medicine to lower your blood pressure, make sure you know how and at what times they should be taken. Taking two medicines at the same time can result in blood pressure that is too low.  SEEK IMMEDIATE MEDICAL CARE IF:  You develop a severe headache, blurred or changing vision, or confusion.   You have unusual weakness or numbness, or a faint feeling.   You have severe chest or abdominal pain, vomiting, or breathing problems.  MAKE SURE YOU:   Understand these instructions.   Will watch your condition.   Will get help right away if you are not doing well or get worse.  Document Released: 11/01/2005 Document Revised: 10/21/2011 Document Reviewed: 06/21/2008 [facility] Patient Information 2012 [place], [place].

## 2012-03-24 NOTE — ED Provider Notes (Signed)
Psychiatrist/telepsych md has completed evaluation. States pt not acutely psychotic or delusional, has rescinded ivc, and states pt has no thoughts of harm to self or others, and recommends d/c home. On recheck pt, pt alert, content, eating breakfast. Pt cooperative, responds appropriately. Is oriented. Has no delusional thoughts processes, hallucinations or acute psychosis. States did get angry/frustrated last, expresses regret for becoming angry, but states has no thoughts of harming others, family, or self. Pt appears happy, content, and optimistic about d/c to home this morning. States does have doctor/therapist with whom she will follow up closely.   Suzi Roots, MD 03/24/12 [id number]
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
## 2012-03-24 NOTE — ED Notes (Signed)
Terri ACT team at bedside.

## 2012-03-24 NOTE — ED Notes (Signed)
MD at bedside. Steinl EDP 

## 2012-04-04 ENCOUNTER — Ambulatory Visit (HOSPITAL_COMMUNITY): Payer: Self-pay | Admitting: Physician Assistant

## 2012-08-25 ENCOUNTER — Emergency Department (HOSPITAL_COMMUNITY)
Admission: EM | Admit: 2012-08-25 | Discharge: 2012-08-25 | Disposition: A | Discharge status: Home / Self Care | Payer: No Typology Code available for payment source | Attending: Emergency Medicine | Admitting: Emergency Medicine

## 2012-08-25 ENCOUNTER — Emergency Department (HOSPITAL_COMMUNITY): Payer: No Typology Code available for payment source

## 2012-08-25 ENCOUNTER — Encounter (HOSPITAL_COMMUNITY): Payer: Self-pay | Admitting: *Deleted

## 2012-08-25 DIAGNOSIS — M25519 Pain in unspecified shoulder: Secondary | ICD-10-CM | POA: Diagnosis not present

## 2012-08-25 DIAGNOSIS — F329 Major depressive disorder, single episode, unspecified: Secondary | ICD-10-CM | POA: Diagnosis not present

## 2012-08-25 DIAGNOSIS — M545 Low back pain, unspecified: Secondary | ICD-10-CM | POA: Insufficient documentation

## 2012-08-25 DIAGNOSIS — Z79899 Other long term (current) drug therapy: Secondary | ICD-10-CM | POA: Insufficient documentation

## 2012-08-25 DIAGNOSIS — M79609 Pain in unspecified limb: Secondary | ICD-10-CM | POA: Diagnosis not present

## 2012-08-25 DIAGNOSIS — G809 Cerebral palsy, unspecified: Secondary | ICD-10-CM | POA: Insufficient documentation

## 2012-08-25 DIAGNOSIS — F259 Schizoaffective disorder, unspecified: Secondary | ICD-10-CM | POA: Diagnosis not present

## 2012-08-25 DIAGNOSIS — F3289 Other specified depressive episodes: Secondary | ICD-10-CM | POA: Insufficient documentation

## 2012-08-25 DIAGNOSIS — I10 Essential (primary) hypertension: Secondary | ICD-10-CM | POA: Insufficient documentation

## 2012-08-25 DIAGNOSIS — M542 Cervicalgia: Secondary | ICD-10-CM | POA: Insufficient documentation

## 2012-08-25 DIAGNOSIS — E119 Type 2 diabetes mellitus without complications: Secondary | ICD-10-CM | POA: Diagnosis not present

## 2012-08-25 DIAGNOSIS — T148XXA Other injury of unspecified body region, initial encounter: Secondary | ICD-10-CM | POA: Insufficient documentation

## 2012-08-25 MED ORDER — HYDROCODONE-ACETAMINOPHEN 5-325 MG PO TABS: 1.0000 | ORAL_TABLET | ORAL | Status: DC | PRN

## 2012-08-25 MED ORDER — OXYCODONE-ACETAMINOPHEN 5-325 MG PO TABS
1.0000 | ORAL_TABLET | Freq: Once | ORAL | Status: AC
  Administered 2012-08-25: 1 via ORAL
  Filled 2012-08-25: qty 1

## 2012-08-25 MED ORDER — CYCLOBENZAPRINE HCL 10 MG PO TABS: 10.0000 mg | ORAL_TABLET | Freq: Three times a day (TID) | ORAL | Status: DC | PRN
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
## 2012-08-25 NOTE — ED Notes (Signed)
Rx given x2 D/c instructions reviewed w/ pt and family - pt and family deny any further questions or concerns at present.  
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
## 2012-08-26 NOTE — ED Provider Notes (Signed)
Medical screening examination/treatment/procedure(s) were performed by non-physician practitioner and as supervising physician I was immediately available for consultation/collaboration.   Gwyneth Sprout, MD 08/26/12 0001

## 2012-09-29 ENCOUNTER — Institutional Professional Consult (permissible substitution): Payer: Medicare Other | Admitting: Internal Medicine

## 2012-11-07 ENCOUNTER — Emergency Department (HOSPITAL_COMMUNITY): Payer: Medicare Other

## 2012-11-07 ENCOUNTER — Encounter (HOSPITAL_COMMUNITY): Payer: Self-pay | Admitting: Internal Medicine

## 2012-11-07 ENCOUNTER — Observation Stay (HOSPITAL_COMMUNITY)
Admission: EM | Admit: 2012-11-07 | Discharge: 2012-11-08 | Disposition: A | Discharge status: Home / Self Care | Payer: Medicare Other | Attending: Family Medicine | Admitting: Family Medicine

## 2012-11-07 DIAGNOSIS — K219 Gastro-esophageal reflux disease without esophagitis: Secondary | ICD-10-CM | POA: Insufficient documentation

## 2012-11-07 DIAGNOSIS — R000 Tachycardia, unspecified: Secondary | ICD-10-CM

## 2012-11-07 DIAGNOSIS — R011 Cardiac murmur, unspecified: Secondary | ICD-10-CM

## 2012-11-07 DIAGNOSIS — R079 Chest pain, unspecified: Secondary | ICD-10-CM

## 2012-11-07 DIAGNOSIS — N6019 Diffuse cystic mastopathy of unspecified breast: Secondary | ICD-10-CM

## 2012-11-07 DIAGNOSIS — Z8679 Personal history of other diseases of the circulatory system: Secondary | ICD-10-CM | POA: Diagnosis present

## 2012-11-07 DIAGNOSIS — N809 Endometriosis, unspecified: Secondary | ICD-10-CM

## 2012-11-07 DIAGNOSIS — F329 Major depressive disorder, single episode, unspecified: Secondary | ICD-10-CM | POA: Insufficient documentation

## 2012-11-07 DIAGNOSIS — F8189 Other developmental disorders of scholastic skills: Secondary | ICD-10-CM

## 2012-11-07 DIAGNOSIS — R0789 Other chest pain: Secondary | ICD-10-CM | POA: Diagnosis present

## 2012-11-07 DIAGNOSIS — E119 Type 2 diabetes mellitus without complications: Secondary | ICD-10-CM | POA: Diagnosis present

## 2012-11-07 DIAGNOSIS — R509 Fever, unspecified: Principal | ICD-10-CM | POA: Diagnosis present

## 2012-11-07 DIAGNOSIS — I10 Essential (primary) hypertension: Secondary | ICD-10-CM

## 2012-11-07 DIAGNOSIS — G809 Cerebral palsy, unspecified: Secondary | ICD-10-CM | POA: Insufficient documentation

## 2012-11-07 DIAGNOSIS — R5381 Other malaise: Secondary | ICD-10-CM | POA: Diagnosis present

## 2012-11-07 DIAGNOSIS — Z79899 Other long term (current) drug therapy: Secondary | ICD-10-CM | POA: Insufficient documentation

## 2012-11-07 DIAGNOSIS — F3289 Other specified depressive episodes: Secondary | ICD-10-CM | POA: Insufficient documentation

## 2012-11-07 DIAGNOSIS — M25519 Pain in unspecified shoulder: Secondary | ICD-10-CM | POA: Insufficient documentation

## 2012-11-07 DIAGNOSIS — I959 Hypotension, unspecified: Secondary | ICD-10-CM

## 2012-11-07 DIAGNOSIS — H9190 Unspecified hearing loss, unspecified ear: Secondary | ICD-10-CM | POA: Insufficient documentation

## 2012-11-07 DIAGNOSIS — F259 Schizoaffective disorder, unspecified: Secondary | ICD-10-CM | POA: Insufficient documentation

## 2012-11-07 LAB — COMPREHENSIVE METABOLIC PANEL
ALT: 14 U/L (ref 0–35)
AST: 14 U/L (ref 0–37)
Alkaline Phosphatase: 69 U/L (ref 39–117)
CO2: 26 mEq/L (ref 19–32)
GFR calc Af Amer: 85 mL/min — ABNORMAL LOW (ref >90)
GFR calc non Af Amer: 74 mL/min — ABNORMAL LOW (ref >90)
Glucose, Bld: 157 mg/dL — ABNORMAL HIGH (ref 70–99)
Potassium: 3.1 mEq/L — ABNORMAL LOW (ref 3.5–5.1)
Sodium: 140 mEq/L (ref 135–145)

## 2012-11-07 LAB — CBC
Hemoglobin: 11 g/dL — ABNORMAL LOW (ref 12.0–15.0)
Platelets: 221 10*3/uL (ref 150–400)
Platelets: 240 10*3/uL (ref 150–400)
RBC: 3.86 MIL/uL — ABNORMAL LOW (ref 3.87–5.11)
RBC: 3.68 MIL/uL — ABNORMAL LOW (ref 3.87–5.11)
RDW: 14.1 % (ref 11.5–15.5)
WBC: 5.3 10*3/uL (ref 4.0–10.5)
WBC: 11.4 10*3/uL — ABNORMAL HIGH (ref 4.0–10.5)

## 2012-11-07 LAB — CREATININE, SERUM
Creatinine, Ser: 0.91 mg/dL (ref 0.50–1.10)
GFR calc Af Amer: >90 mL/min (ref >90)
GFR calc non Af Amer: 78 mL/min — ABNORMAL LOW (ref >90)

## 2012-11-07 LAB — TROPONIN I
Troponin I: <0.3 ng/mL (ref <0.30)
Troponin I: <0.3 ng/mL (ref <0.30)
Troponin I: <0.3 ng/mL (ref <0.30)

## 2012-11-07 LAB — URINALYSIS, ROUTINE W REFLEX MICROSCOPIC
Bilirubin Urine: NEGATIVE
Hgb urine dipstick: NEGATIVE
Nitrite: NEGATIVE
Specific Gravity, Urine: 1.015 (ref 1.005–1.030)
Urobilinogen, UA: 0.2 mg/dL (ref 0.0–1.0)
pH: 5.5 (ref 5.0–8.0)

## 2012-11-07 LAB — POCT I-STAT TROPONIN I

## 2012-11-07 LAB — INFLUENZA PANEL BY PCR (TYPE A & B): Influenza B By PCR: NEGATIVE

## 2012-11-07 LAB — TSH: TSH: 2.788 u[IU]/mL (ref 0.350–4.500)

## 2012-11-07 LAB — GLUCOSE, CAPILLARY
Glucose-Capillary: 144 mg/dL — ABNORMAL HIGH (ref 70–99)
Glucose-Capillary: 118 mg/dL — ABNORMAL HIGH (ref 70–99)

## 2012-11-07 LAB — CREATININE, URINE, RANDOM: Creatinine, Urine: 63.68 mg/dL

## 2012-11-07 MED ORDER — ONDANSETRON HCL 4 MG/2ML IJ SOLN
4.0000 mg | Freq: Once | INTRAMUSCULAR | Status: AC
  Administered 2012-11-07: 4 mg via INTRAVENOUS
  Filled 2012-11-07: qty 2

## 2012-11-07 MED ORDER — POTASSIUM CHLORIDE CRYS ER 20 MEQ PO TBCR
40.0000 meq | EXTENDED_RELEASE_TABLET | Freq: Once | ORAL | Status: AC
Start: 2012-11-07 — End: 2012-11-07
  Administered 2012-11-07: 40 meq via ORAL
  Filled 2012-11-07: qty 2

## 2012-11-07 MED ORDER — LORAZEPAM 2 MG/ML IJ SOLN
1.0000 mg | Freq: Once | INTRAMUSCULAR | Status: AC
  Administered 2012-11-07: 1 mg via INTRAVENOUS
  Filled 2012-11-07: qty 1

## 2012-11-07 MED ORDER — ONDANSETRON HCL 4 MG PO TABS: 4.0000 mg | ORAL_TABLET | Freq: Four times a day (QID) | ORAL | Status: DC | PRN

## 2012-11-07 MED ORDER — ACETAMINOPHEN 650 MG RE SUPP: 650.0000 mg | Freq: Four times a day (QID) | RECTAL | Status: DC | PRN

## 2012-11-07 MED ORDER — SODIUM CHLORIDE 0.9 % IJ SOLN
3.0000 mL | Freq: Two times a day (BID) | INTRAMUSCULAR | Status: DC
  Administered 2012-11-08: 3 mL via INTRAVENOUS

## 2012-11-07 MED ORDER — TECHNETIUM TO 99M ALBUMIN AGGREGATED
6.0000 | Freq: Once | INTRAVENOUS | Status: AC | PRN
  Administered 2012-11-07: 6 via INTRAVENOUS

## 2012-11-07 MED ORDER — TECHNETIUM TC 99M DIETHYLENETRIAME-PENTAACETIC ACID: 40.0000 | Freq: Once | INTRAVENOUS | Status: AC | PRN

## 2012-11-07 MED ORDER — VENLAFAXINE HCL ER 150 MG PO CP24
150.0000 mg | ORAL_CAPSULE | Freq: Every day | ORAL | Status: DC
  Administered 2012-11-07: 150 mg via ORAL
  Filled 2012-11-07 (×2): qty 1

## 2012-11-07 MED ORDER — ASPIRIN EC 325 MG PO TBEC
325.0000 mg | DELAYED_RELEASE_TABLET | Freq: Every day | ORAL | Status: DC
  Administered 2012-11-07 – 2012-11-08 (×2): 325 mg via ORAL
  Filled 2012-11-07 (×2): qty 1

## 2012-11-07 MED ORDER — INSULIN GLARGINE 100 UNIT/ML ~~LOC~~ SOLN
10.0000 [IU] | Freq: Every day | SUBCUTANEOUS | Status: DC
  Administered 2012-11-07: 10 [IU] via SUBCUTANEOUS

## 2012-11-07 MED ORDER — ASPIRIN 81 MG PO CHEW
81.0000 mg | CHEWABLE_TABLET | Freq: Once | ORAL | Status: AC
  Administered 2012-11-07: 81 mg via ORAL
  Filled 2012-11-07: qty 1

## 2012-11-07 MED ORDER — SODIUM CHLORIDE 0.9 % IV BOLUS (SEPSIS): 500.0000 mL | Freq: Once | INTRAVENOUS | Status: DC

## 2012-11-07 MED ORDER — VENLAFAXINE HCL ER 75 MG PO CP24
75.0000 mg | ORAL_CAPSULE | Freq: Two times a day (BID) | ORAL | Status: DC
  Filled 2012-11-07: qty 2

## 2012-11-07 MED ORDER — HEPARIN SODIUM (PORCINE) 5000 UNIT/ML IJ SOLN
5000.0000 [IU] | Freq: Three times a day (TID) | INTRAMUSCULAR | Status: DC
  Administered 2012-11-07 (×2): 5000 [IU] via SUBCUTANEOUS
  Filled 2012-11-07 (×6): qty 1

## 2012-11-07 MED ORDER — ONDANSETRON HCL 4 MG/2ML IJ SOLN: 4.0000 mg | Freq: Four times a day (QID) | INTRAMUSCULAR | Status: DC | PRN

## 2012-11-07 MED ORDER — SODIUM CHLORIDE 0.9 % IV BOLUS (SEPSIS)
250.0000 mL | Freq: Once | INTRAVENOUS | Status: AC
  Administered 2012-11-07: 250 mL via INTRAVENOUS

## 2012-11-07 MED ORDER — MORPHINE SULFATE 4 MG/ML IJ SOLN
4.0000 mg | Freq: Once | INTRAMUSCULAR | Status: AC
  Administered 2012-11-07: 4 mg via INTRAVENOUS
  Filled 2012-11-07: qty 1

## 2012-11-07 MED ORDER — VENLAFAXINE HCL ER 75 MG PO CP24
75.0000 mg | ORAL_CAPSULE | Freq: Every day | ORAL | Status: DC
  Administered 2012-11-08: 75 mg via ORAL
  Filled 2012-11-07 (×2): qty 1

## 2012-11-07 MED ORDER — ACETAMINOPHEN 325 MG PO TABS
650.0000 mg | ORAL_TABLET | Freq: Once | ORAL | Status: AC
  Administered 2012-11-07: 650 mg via ORAL
  Filled 2012-11-07: qty 2

## 2012-11-07 MED ORDER — ACETAMINOPHEN 325 MG PO TABS
650.0000 mg | ORAL_TABLET | Freq: Four times a day (QID) | ORAL | Status: DC | PRN
  Administered 2012-11-07: 650 mg via ORAL
  Filled 2012-11-07: qty 2

## 2012-11-07 MED ORDER — INSULIN ASPART 100 UNIT/ML ~~LOC~~ SOLN: 0.0000 [IU] | Freq: Every day | SUBCUTANEOUS | Status: DC

## 2012-11-07 MED ORDER — IBUPROFEN 400 MG PO TABS
400.0000 mg | ORAL_TABLET | Freq: Four times a day (QID) | ORAL | Status: DC | PRN
  Administered 2012-11-08: 400 mg via ORAL
  Filled 2012-11-07: qty 1

## 2012-11-07 MED ORDER — SODIUM CHLORIDE 0.9 % IV SOLN
INTRAVENOUS | Status: AC
  Administered 2012-11-07: 14:00:00 via INTRAVENOUS

## 2012-11-07 MED ORDER — INSULIN ASPART 100 UNIT/ML ~~LOC~~ SOLN
0.0000 [IU] | Freq: Three times a day (TID) | SUBCUTANEOUS | Status: DC
  Administered 2012-11-07 – 2012-11-08 (×2): 2 [IU] via SUBCUTANEOUS

## 2012-11-07 MED ORDER — SODIUM CHLORIDE 0.9 % IV BOLUS (SEPSIS)
1000.0000 mL | Freq: Once | INTRAVENOUS | Status: AC
  Administered 2012-11-07: 1000 mL via INTRAVENOUS

## 2012-11-07 MED ORDER — POLYETHYLENE GLYCOL 3350 17 G PO PACK
17.0000 g | PACK | Freq: Every day | ORAL | Status: DC | PRN
  Filled 2012-11-07: qty 1

## 2012-11-07 MED ORDER — RISPERIDONE 1 MG PO TABS
1.0000 mg | ORAL_TABLET | Freq: Every day | ORAL | Status: DC
  Administered 2012-11-07 – 2012-11-08 (×2): 1 mg via ORAL
  Filled 2012-11-07 (×2): qty 1

## 2012-11-07 NOTE — ED Notes (Signed)
Patient transported to X-ray 

## 2012-11-07 NOTE — Evaluation (Signed)
Physical Therapy Evaluation Patient Details Name: Kevona Lupinacci MRN: [id number] DOB: 29-Aug-1971 Today's Date: 11/07/2012 Time: 4098-1191 PT Time Calculation (min): 14 min  PT Assessment / Plan / Recommendation Clinical Impression  Pt admitted with back and chest pain and reports vague diffuse pain of bil UE and bil LE with mobility and ROM. Pt reports pain grossly 5/10 keeps her from mobilizing further. Pt with HR 112-115 throughout activity and pt encouraged to mobilize throughout the day to maintain strength and function and prevent stiffness from impacting function. Pt will benefit from acute therapy to maximize mobility and gait for return  home with family. Anticipate quick progression.     PT Assessment  Patient needs continued PT services    Follow Up Recommendations  Supervision for mobility/OOB;No PT follow up    Does the patient have the potential to tolerate intense rehabilitation      Barriers to Discharge None      Equipment Recommendations  None recommended by PT    Recommendations for Other Services     Frequency Min 3X/week    Precautions / Restrictions Precautions Precautions: Fall   Pertinent Vitals/Pain Grossly 5/10 diffuse pain, upper and lower body      Mobility  Bed Mobility Bed Mobility: Supine to Sit;Sitting - Scoot to Edge of Bed Supine to Sit: 5: Supervision;HOB flat Sitting - Scoot to Edge of Bed: 6: Modified independent (Device/Increase time) Details for Bed Mobility Assistance: increased time with cueing to initiate Transfers Transfers: Sit to Stand;Stand to Sit Sit to Stand: 4: Min guard;From bed Stand to Sit: 5: Supervision;To chair/3-in-1 Details for Transfer Assistance: cueing for sequence with increased time to complete Ambulation/Gait Ambulation/Gait Assistance: 4: Min guard Ambulation Distance (Feet): 15 Feet Assistive device: 1 person hand held assist Ambulation/Gait Assistance Details: increased BOS with short shuffling  steps which pt and family report as deviation from baseline, pt denied further mobility Gait Pattern: Shuffle;Wide base of support Gait velocity: decreased Stairs: No    Shoulder Instructions     Exercises     PT Diagnosis: Difficulty walking;Acute pain  PT Problem List: Decreased range of motion;Decreased strength;Decreased activity tolerance;Decreased mobility;Pain PT Treatment Interventions: Gait training;Stair training;Therapeutic activities;Patient/family education   PT Goals Acute Rehab PT Goals PT Goal Formulation: With patient/family Time For Goal Achievement: 11/14/12 Potential to Achieve Goals: Good Pt will go Sit to Stand: with modified independence PT Goal: Sit to Stand - Progress: Goal set today Pt will go Stand to Sit: with modified independence PT Goal: Stand to Sit - Progress: Goal set today Pt will Ambulate: >150 feet;with modified independence PT Goal: Ambulate - Progress: Goal set today Pt will Go Up / Down Stairs: Flight;with rail(s);with supervision PT Goal: Up/Down Stairs - Progress: Goal set today  Visit Information  Last PT Received On: 11/07/12 Assistance Needed: +1    Subjective Data  Subjective: I just can't do much Patient Stated Goal: know why my arm feels broken   Prior Functioning  Home Living Lives With: Family (mother) Available Help at Discharge: Family;Available 24 hours/day Type of Home: Apartment Home Access: Stairs to enter Entrance Stairs-Number of Steps: flight Home Layout: One level Bathroom Shower/Tub: Forensic psychologist: None Prior Function Level of Independence: Independent Able to Take Stairs?: Yes Driving: Yes Vocation: On disability Comments: pt goes to workshops during the day. Pt normally performs own ADLs, gait and drives  Communication Communication: No difficulties    Cognition  Overall Cognitive Status: Impaired Arousal/Alertness:  Awake/alert Orientation Level: Appears intact for tasks assessed Behavior During Session: [facility] for tasks performed Cognition - Other Comments: slow to process, mother tends to answer for pt    Extremity/Trunk Assessment Right Upper Extremity Assessment RUE ROM/Strength/Tone: Deficits;Due to pain RUE ROM/Strength/Tone Deficits: pt able to lift bil UE to grossly 110 shoulder flexion but would not go beyond or perform further ROM due to pain, did not attempt resistance Left Upper Extremity Assessment LUE ROM/Strength/Tone: Deficits;Due to pain LUE ROM/Strength/Tone Deficits: pt able to lift bil UE to grossly 110 shoulder flexion but would not go beyond or perform further ROM due to pain, did not attempt resistance Right Lower Extremity Assessment RLE ROM/Strength/Tone: Deficits;Due to pain;WFL for tasks assessed RLE ROM/Strength/Tone Deficits: Pt would not demonstrate full ROM of hips or knees when asked with myotome testing, barely lifting foot from floor and would only extend knee grossly 10degrees. However with mobility demonstrates at least 2+/5 strength and functional ROM Left Lower Extremity Assessment LLE ROM/Strength/Tone: Deficits;Due to pain;WFL for tasks assessed LLE ROM/Strength/Tone Deficits: Pt would not demonstrate full ROM of hips or knees when asked with myotome testing, barely lifting foot from floor and would only extend knee grossly 10degrees. However with mobility demonstrates at least 2+/5 strength and functional ROM Trunk Assessment Trunk Assessment: Normal   Balance    End of Session PT - End of Session Activity Tolerance: Patient limited by fatigue;Patient limited by pain Patient left: in chair;with call bell/phone within reach;with family/visitor present  GP     Toney Sang Beth 11/07/2012, 4:01 PM  Delaney Meigs, PT [phone]

## 2012-11-07 NOTE — ED Provider Notes (Addendum)
History     CSN: [id number]  Arrival date & time 11/07/12  4696   First MD Initiated Contact with Patient 11/07/12 0309      Chief Complaint  Patient presents with  . Chest Pain  . Back Pain    (Consider location/radiation/quality/duration/timing/severity/associated sxs/prior treatment) Patient is a 41 y.o. female presenting with chest pain and back pain. The history is provided by the patient.  Chest Pain The chest pain began yesterday. Chest pain occurs intermittently. The chest pain is unchanged. At its most intense, the pain is at 4/10. The pain is currently at 4/10. The severity of the pain is moderate. The quality of the pain is described as aching. The pain radiates to the left arm. She tried NSAIDs for the symptoms.  Her past medical history is significant for diabetes.    Back Pain  Associated symptoms include chest pain.    Past Medical History  Diagnosis Date  . Diabetes mellitus due to abnormal insulin   . Hypertension   . Congenital cerebral palsy   . Hearing impairment   . Schizoaffective disorder   . Depression     Past Surgical History  Procedure Date  . Cesarean section   . Exploratory laparotomy with abdominal mass excision     No family history on file.  History  Substance Use Topics  . Smoking status: Never Smoker   . Smokeless tobacco: Not on file  . Alcohol Use: No    OB History    Grav Para Term Preterm Abortions TAB SAB Ect Mult Living                  Review of Systems  Cardiovascular: Positive for chest pain.  Musculoskeletal: Positive for back pain.  All other systems reviewed and are negative.    Allergies  Review of patient's allergies indicates no known allergies.  Home Medications   Current Outpatient Rx  Name  Route  Sig  Dispense  Refill  . CYCLOBENZAPRINE HCL 10 MG PO TABS   Oral   Take 1 tablet (10 mg total) by mouth 3 (three) times daily as needed for muscle spasms.   30 tablet   0   .  HYDROCODONE-ACETAMINOPHEN 5-325 MG PO TABS   Oral   Take 1-2 tablets by mouth every 4 (four) hours as needed for pain.   10 tablet   0   . LISINOPRIL-HYDROCHLOROTHIAZIDE 20-25 MG PO TABS   Oral   Take 1 tablet by mouth daily.         [name] METFORMIN HCL 500 MG PO TABS   Oral   Take 500 mg by mouth 2 (two) times daily with a meal.         . RISPERIDONE 1 MG PO TABS   Oral   Take 2 mg by mouth at bedtime.         . VENLAFAXINE HCL ER 75 MG PO CP24   Oral   Take 75 mg by mouth 3 (three) times daily. TAKE ONE CAPSULE BY MOUTH IN THE MORNING AND TWO AT BEDTIME per patient           BP 95/53  Temp 98.8 F (37.1 C) (Oral)  Resp 24  SpO2 98%  LMP 10/30/2012  Physical Exam  Constitutional: She is oriented to person, place, and time. She appears well-developed and well-nourished.  HENT:  Head: Normocephalic and atraumatic.  Eyes: Conjunctivae normal and EOM are normal. Pupils are equal, round, and reactive to light.  Neck: Normal range of motion.  Cardiovascular: Normal rate and normal heart sounds.        tachycardia  Pulmonary/Chest: Effort normal and breath sounds normal.  Abdominal: Soft. Bowel sounds are normal.  Musculoskeletal: Normal range of motion.  Neurological: She is alert and oriented to person, place, and time.  Skin: Skin is warm and dry.  Psychiatric: She has a normal mood and affect. Her behavior is normal.    ED Course  Procedures (including critical care time)  Labs Reviewed  CBC - Abnormal; Notable for the following:    RBC 3.86 (*)     Hemoglobin 11.0 (*)     HCT 33.1 (*)     All other components within normal limits  COMPREHENSIVE METABOLIC PANEL  D-DIMER, QUANTITATIVE   No results found.   No diagnosis found.    Date: 11/07/2012  Rate: 121  Rhythm: sinus tachycardia  QRS Axis: left  Intervals: normal  ST/T Wave abnormalities: nonspecific ST changes  Conduction Disutrbances:none  Narrative Interpretation:   Old EKG Reviewed:  unchanged and changes noted   MDM  + chest pain,  Will labs,  Analgesia,  reassess  + cont tachycardia and pain.  No previous r.o.  Discussed with hospitalist,  Will admit.  Hypotension improved.        Shamon Lobo Lytle Michaels, MD 11/07/12 2130  Rosanne Ashing, MD 11/07/12 8657  Rosanne Ashing, MD 11/07/12 [phone]

## 2012-11-07 NOTE — ED Notes (Signed)
Pt returned from Nuc Med.  IV R ac noted to be swollen and painful. IV restarted.  No distress noted.

## 2012-11-07 NOTE — H&P (Addendum)
[facility] History and Physical  [name] ZOX:[id number] DOB: 05-Dec-1970 DOA: 11/07/2012  Referring physician:  Dr. PCP: Burtis Junes, MD  Specialists: none  Chief Complaint: chest pain  HPI: [name] is a 41 y.o. female  With past medical history of diabetes, unknown hemoglobin A1c also hypertension that comes in for chest pain and shoulder pain. Patient is a poor historian as she cannot give a clear history of what is going on, she just relates that she is hurting in her shoulders and her chest and probably her back. She relates it has been going on 2 days prior to admission nothing makes it better or worse is unchanged. Is not made worse by exertion or better with rest. Is not positional. Not associated with foods. She relates only mild nausea no vomiting. She doesn't relate any shortness of breath with these episodes. It is reproducible by palpation. During our conversation her mother seems very pushy, and saying that the patient does have to be admitted. And she cannot bring the patient back to the emergency room if she is discharged. Here in the ED she was found to have a temperature of 102.7. She relates no dysuria some body aches. No sore throat.  Review of Systems: The patient denies anorexia, fever, weight loss,, vision loss,  hoarseness,  syncope, dyspnea on exertion, peripheral edema, balance deficits, hemoptysis, abdominal pain, melena, hematochezia, severe indigestion/heartburn, hematuria, incontinence, genital sores, muscle weakness, suspicious skin lesions, transient blindness, difficulty walking, depression, unusual weight change, abnormal bleeding, enlarged lymph nodes, angioedema, and breast masses.    Past Medical History  Diagnosis Date  . Diabetes mellitus due to abnormal insulin   . Hypertension   . Congenital cerebral palsy   . Hearing impairment   . Schizoaffective disorder   . Depression    Past Surgical History  Procedure  Date  . Cesarean section   . Exploratory laparotomy with abdominal mass excision    Social History:  reports that she has never smoked. She does not have any smokeless tobacco history on file. She reports that she does not drink alcohol or use illicit drugs. Is at home with mother can perform all her ADLs  Allergies  Allergen Reactions  . Ivp Dye (Iodinated Diagnostic Agents)     Family History  Problem Relation Age of Onset  . Heart failure Mother   . Hypertension Mother   . Diabetes Mellitus II Mother   . Cancer Mother   . Diabetes Mellitus II Father   . Hypertension Father     Prior to Admission medications   Medication Sig Start Date End Date Taking? Authorizing Provider  lisinopril-hydrochlorothiazide (PRINZIDE,ZESTORETIC) 20-25 MG per tablet Take 1 tablet by mouth daily.   Yes Historical Provider, MD  metFORMIN (GLUCOPHAGE) 500 MG tablet Take 500 mg by mouth 2 (two) times daily with a meal.   Yes Historical Provider, MD  risperiDONE (RISPERDAL) 1 MG tablet Take 1 mg by mouth daily.    Yes Historical Provider, MD  venlafaxine XR (EFFEXOR-XR) 75 MG 24 hr capsule Take 75-150 mg by mouth 2 (two) times daily. TAKE ONE CAPSULE BY MOUTH IN THE MORNING AND TWO AT BEDTIME per patient 02/03/12  Yes Jorje Guild, PA-C   Physical Exam: Filed Vitals:   11/07/12 1115 11/07/12 1200 11/07/12 1300 11/07/12 1312  BP: 96/58 105/59 103/57   Pulse: 123 127 115 117  Temp:    102.7 F (39.3 C)  TempSrc:    Oral  Resp: 23 27  22   SpO2: 100% 100% 99%      General:  Obese female awake alert and oriented x3  Eyes: Bilateral red eyes  ENT:  Dry mucous membrane  Neck: No JVD  Cardiovascular: Tachycardic and regular rhythm with positive S1 and S2  Respiratory: Good air movement clear to auscultation chest pain reproducible by palpation  Abdomen: Positive bowel sounds nontender nondistended and soft  Skin: No rashes or ulcerations  Musculoskeletal: Intact  Psychiatric: Depressed  mood  Neurologic: Awake alert and oriented x4 coherent for language  Labs on Admission:  Basic Metabolic Panel:  Lab 11/07/12 0454  NA 140  K 3.1*  CL 100  CO2 26  GLUCOSE 157*  BUN 10  CREATININE 0.96  CALCIUM 9.1  MG --  PHOS --   Liver Function Tests:  Lab 11/07/12 0329  AST 14  ALT 14  ALKPHOS 69  BILITOT 0.2*  PROT 6.5  ALBUMIN 3.4*   No results found for this basename: LIPASE:5,AMYLASE:5 in the last 168 hours No results found for this basename: AMMONIA:5 in the last 168 hours CBC:  Lab 11/07/12 0329  WBC 5.3  NEUTROABS --  HGB 11.0*  HCT 33.1*  MCV 85.8  PLT 221   Cardiac Enzymes: No results found for this basename: CKTOTAL:5,CKMB:5,CKMBINDEX:5,TROPONINI:5 in the last 168 hours  BNP (last 3 results) No results found for this basename: PROBNP:3 in the last 8760 hours CBG: No results found for this basename: GLUCAP:5 in the last 168 hours  Radiological Exams on Admission: Dg Chest 2 View  11/07/2012  *RADIOLOGY REPORT*  Clinical Data: Chest pain and back pain; shortness of breath.  CHEST - 2 VIEW  Comparison: Chest radiograph performed 06/28/2011  Findings: The lungs are well-aerated.  Pulmonary vascularity is at the upper limits of normal.  There is no evidence of focal opacification, pleural effusion or pneumothorax.  The heart is normal in size; the mediastinal contour is within normal limits.  No acute osseous abnormalities are seen.  IMPRESSION: No acute cardiopulmonary process seen.   Original Report Authenticated By: Tonia Ghent, M.D.    Nm Pulmonary Perf And Vent  11/07/2012  *RADIOLOGY REPORT*  Clinical Data: Chest pain and shortness of breath.  Diabetic hypertensive.  Dye allergy.  NM PULMONARY VENTILATION AND PERFUSION SCAN  Radiopharmaceutical: CURIE MAA TECHNETIUM TO 21M ALBUMIN AGGREGATED 40 mCi technetium 30m DTPA.  Comparison: 11/07/2012 chest x-ray.  Findings: Elevated right hemidiaphragm.  Corresponding ventilation and perfusion  imaging. Minimal heterogeneous distribution and anterior inferior aspect right lateral view.  IMPRESSION: Very low probability for pulmonary embolus.   Original Report Authenticated By: Lacy Duverney, M.D.     EKG: Sinus tachycardia with a normal axis nonspecific T-wave changes  Assessment/Plan: Fever: - Unclear etiology of the source of her fever, her chest x-ray does not show any infiltrates, she is not complaining of any shortness of breath or cough. Her UA does appear to be clean. She has no dysuria and on physical exam she has no tenderness in the suprapubic area or CVA tenderness. Go ahead and repeat her chest x-ray after hydration to make sure she doesn't have a new infiltrate. Which seems unlikely.  -Her Eulah Pont sign is negative her LFTs are within normal limits, her bilirubin is low so I doubt this is a gallbladder disease. She is not complaining of abdominal pain in her abdomen radiating to the back which makes pain but that is unlikely. She doesn't have an infiltrate on chest x-ray which ruled out  pneumonia. Her EKG does not show diffuse ST segment elevation which makes pericarditis unlikely it is unlikely. She doesn't have a white count and her McBurney sign is negative which makes appendicitis less likely.D-dimer positive,  V/q scan negative for PE as patient relates she get's nauseated with IV contrast.  - This most likely a viral infection. - We will go ahead and admit her to the hospital cycle her cardiac enzymes x3, check a 2-D echo in the morning, give her aggressive IV fluid hydration and check an influenza PCR. I will not start her on an. Tamiflu at this time as the patient is a poor historian and on not sure how reliable her history is.  - Check orthostatic and a urinary sodium.  Atypical chest pain: - Admit to the hospital cycle cardiac enzymes x3 get, get a 2-D echo. Aspirin 325. - V/q scan negative.  Malaise: - Ibuprofen.  Hypertension/hypotension: - Check a lactic acid.  Emergency room this or any given her 500 cc of normal saline. There was one blood pressure measurment of a systolic BP of 80's whic repeated was >90's. I will go ahead and give her an additional 3 L of normal saline and continue her 125 cc an hour.  - Blood pressure has improved with IV hydration. Her heart rate has improved. I will continue to hold her blood pressure medication and continue aggressive hydration. She could be over medicated from a HTN standpoint. Which can lower her Blood pressure. But no changes were made to her medication recently. Also she could of taken  accidentaly extra doses of her medication.  Diabetes mellitus: - We'll go ahead and hold her metformin start on Lantus plus sliding scale insulin, check a TSH check a hemoglobin A1c.   Code Status: full Family Communication: mother Disposition Plan: home  Time spent: 60 min  Marinda Elk [facility] Pager [phone]  If 7PM-7AM, please contact night-coverage [email or web address] Password [email or web address] 11/07/2012, 1:25 PM

## 2012-11-07 NOTE — ED Notes (Signed)
Pt reports pain in right chest going around back to left chest that is worse with breathing and movement.  Pt anxious on arrival to ED

## 2012-11-08 ENCOUNTER — Observation Stay (HOSPITAL_COMMUNITY): Payer: Medicare Other

## 2012-11-08 DIAGNOSIS — R079 Chest pain, unspecified: Secondary | ICD-10-CM

## 2012-11-08 DIAGNOSIS — R072 Precordial pain: Secondary | ICD-10-CM

## 2012-11-08 LAB — COMPREHENSIVE METABOLIC PANEL
Albumin: 2.7 g/dL — ABNORMAL LOW (ref 3.5–5.2)
BUN: 8 mg/dL (ref 6–23)
CO2: 25 mEq/L (ref 19–32)
Calcium: 8.2 mg/dL — ABNORMAL LOW (ref 8.4–10.5)
Chloride: 105 mEq/L (ref 96–112)
Creatinine, Ser: 0.83 mg/dL (ref 0.50–1.10)
GFR calc non Af Amer: 88 mL/min — ABNORMAL LOW (ref >90)
Total Bilirubin: 0.2 mg/dL — ABNORMAL LOW (ref 0.3–1.2)

## 2012-11-08 LAB — CBC
Hemoglobin: 9.8 g/dL — ABNORMAL LOW (ref 12.0–15.0)
Platelets: 221 10*3/uL (ref 150–400)
RBC: 3.37 MIL/uL — ABNORMAL LOW (ref 3.87–5.11)
WBC: 7.8 10*3/uL (ref 4.0–10.5)

## 2012-11-08 LAB — GLUCOSE, CAPILLARY: Glucose-Capillary: 129 mg/dL — ABNORMAL HIGH (ref 70–99)

## 2012-11-08 MED ORDER — OXYCODONE-ACETAMINOPHEN 5-325 MG PO TABS: 1.0000 | ORAL_TABLET | Freq: Once | ORAL | Status: DC

## 2012-11-08 MED ORDER — POTASSIUM CHLORIDE CRYS ER 20 MEQ PO TBCR
40.0000 meq | EXTENDED_RELEASE_TABLET | Freq: Once | ORAL | Status: AC
  Administered 2012-11-08: 40 meq via ORAL
  Filled 2012-11-08: qty 2

## 2012-11-08 MED ORDER — PANTOPRAZOLE SODIUM 20 MG PO TBEC: 20.0000 mg | DELAYED_RELEASE_TABLET | Freq: Every day | ORAL | Status: DC

## 2012-11-08 NOTE — Discharge Summary (Signed)
Physician Discharge Summary  [name] ZOX:[id number] DOB: Nov 25, 1970 DOA: 11/07/2012  PCP: Burtis Junes, MD  Admit date: 11/07/2012 Discharge date: 11/08/2012  Time spent: > 35 minutes  Recommendations for Outpatient Follow-up:  1. Please be sure to follow up with patient's potassium levels 2. Also follow up with further reflux evaluation and recommendations. I have started patient on trial of PPI.  Discharge Diagnoses:  Principal Problem:  *Fever Active Problems:  Atypical chest pain  Malaise  Hypertension  Diabetes mellitus   Discharge Condition: Stable  Diet recommendation:   Filed Weights   11/07/12 1453  Weight: 107.5 kg (236 lb 15.9 oz)    History of present illness:  From original HPI: [name] is a 41 y.o. female  With past medical history of diabetes, unknown hemoglobin A1c also hypertension that comes in for chest pain and shoulder pain. Patient is a poor historian as she cannot give a clear history of what is going on, she just relates that she is hurting in her shoulders and her chest and probably her back. She relates it has been going on 2 days prior to admission nothing makes it better or worse is unchanged. Is not made worse by exertion or better with rest. Is not positional. Not associated with foods. She relates only mild nausea no vomiting. She doesn't relate any shortness of breath with these episodes. It is reproducible by palpation. During our conversation her mother seems very pushy, and saying that the patient does have to be admitted. And she cannot bring the patient back to the emergency room if she is discharged.  Here in the ED she was found to have a temperature of 102.7. She relates no dysuria some body aches. No sore throat.  Hospital Course:  1) Atypical chest pain - In context of diabetic patient with history of Reflux and chest pain reproducible on palpation.  At this juncture completely resolved as patient is  not complaining of chest pain.  Has been reproducible on palpation and work up has been negative for cardiac in origin given 3 sets of cardiac enzymes being negative as well as a V/Q scan with low probability for PE.  This has been discussed with patient and mother at bed side who verbalize their understanding. EKG reviewed and most recent on 12/25 shows much artifact with no ST elevation or depressions.  12/24 EKG also reviewed which did not show any ST elevation or depressions. - Will prescribe protonix and try trial of PPI given her history of reflux of which patient reports not taking any medication for at home. - Otherwise patient may try tylenol or NSAIDs for discomfort.  2) DM - Patient's last creatinine 0.83 and at this juncture given that she has not had IV contrast will go ahead and plan on discharging back on her home regimen.  Patient is to continue monitoring her blood sugars regularly.  3) Fever - Most likely viral etiology given that patient's WBC has improved off of antibiotics.  No source identified and chest x ray negative and U/A within norma limits.  4) HTN - At this point suspect patient's lower blood pressures were 2ary to dehydration 2ary to poor oral intake from presumed viral infection.  As such will recommend that patient discontinue her antihypertensive medication until cleared by her PCP.  Procedures:  V/Q scan  Consultations:  none  Discharge Exam: Filed Vitals:   11/07/12 2006 11/07/12 2247 11/08/12 0034 11/08/12 0452  BP: 95/48  100/51 106/63  Pulse: 107  112 94  Temp: 98.3 F (36.8 C) 99.6 F (37.6 C) 99.3 F (37.4 C) 98.2 F (36.8 C)  TempSrc: Oral Oral Oral Oral  Resp: 20  18 16   Height:      Weight:      SpO2: 99%  100% 99%    General: Pt in NAD, looks comfortable, Alert and Awake Cardiovascular: RRR, No MRG Respiratory: CTA BL, no wheezes Abdomen: obese, NT  Discharge Instructions  Discharge Orders    Future Orders Please Complete By  Expires   Diet - low sodium heart healthy      Increase activity slowly      Discharge instructions      Comments:   Please be sure to follow up with your primary care physician in 1-2 weeks or sooner should any new concerns arise.    Also be sure to stop taking your antihypertensive medication until cleared by your pcp.   Call MD for:  temperature >100.4      Call MD for:  severe uncontrolled pain      Call MD for:  extreme fatigue          Medication List     As of 11/08/2012 10:24 AM    STOP taking these medications         lisinopril-hydrochlorothiazide 20-25 MG per tablet   Commonly known as: PRINZIDE,ZESTORETIC      TAKE these medications         metFORMIN 500 MG tablet   Commonly known as: GLUCOPHAGE   Take 500 mg by mouth 2 (two) times daily with a meal.      pantoprazole 20 MG tablet   Commonly known as: PROTONIX   Take 1 tablet (20 mg total) by mouth daily.      risperiDONE 1 MG tablet   Commonly known as: RISPERDAL   Take 1 mg by mouth daily.      venlafaxine XR 75 MG 24 hr capsule   Commonly known as: EFFEXOR-XR   Take 75-150 mg by mouth 2 (two) times daily. TAKE ONE CAPSULE BY MOUTH IN THE MORNING AND TWO AT BEDTIME per patient          The results of significant diagnostics from this hospitalization (including imaging, microbiology, ancillary and laboratory) are listed below for reference.    Significant Diagnostic Studies: X-ray Chest Pa And Lateral   11/08/2012  *RADIOLOGY REPORT*  Clinical Data: Chest pain, shortness of breath  CHEST - 2 VIEW  Comparison: 11/07/2012  Findings: Cardiac leads overlie the chest.  Heart size is normal. Lung volumes are low but clear.  No pleural effusion.  No acute osseous finding.  IMPRESSION: Low volumes without focal acute abnormality.   Original Report Authenticated By: Christiana Pellant, M.D.    Dg Chest 2 View  11/07/2012  *RADIOLOGY REPORT*  Clinical Data: Chest pain and back pain; shortness of breath.  CHEST -  2 VIEW  Comparison: Chest radiograph performed 06/28/2011  Findings: The lungs are well-aerated.  Pulmonary vascularity is at the upper limits of normal.  There is no evidence of focal opacification, pleural effusion or pneumothorax.  The heart is normal in size; the mediastinal contour is within normal limits.  No acute osseous abnormalities are seen.  IMPRESSION: No acute cardiopulmonary process seen.   Original Report Authenticated By: Tonia Ghent, M.D.    Nm Pulmonary Perf And Vent  11/07/2012  *RADIOLOGY REPORT*  Clinical Data: Chest pain and shortness of breath.  Diabetic  hypertensive.  Dye allergy.  NM PULMONARY VENTILATION AND PERFUSION SCAN  Radiopharmaceutical: CURIE MAA TECHNETIUM TO 4M ALBUMIN AGGREGATED 40 mCi technetium 66m DTPA.  Comparison: 11/07/2012 chest x-ray.  Findings: Elevated right hemidiaphragm.  Corresponding ventilation and perfusion imaging. Minimal heterogeneous distribution and anterior inferior aspect right lateral view.  IMPRESSION: Very low probability for pulmonary embolus.   Original Report Authenticated By: Lacy Duverney, M.D.     Microbiology: Recent Results (from the past 240 hour(s))  CULTURE, BLOOD (ROUTINE X 2)     Status: Normal (Preliminary result)   Collection Time   11/07/12  3:56 PM      Component Value Range Status Comment   Specimen Description BLOOD LEFT ARM   Final    Special Requests BOTTLES DRAWN AEROBIC AND ANAEROBIC 10CC   Final    Culture  Setup Time 11/07/2012 23:29   Final    Culture     Final    Value:        BLOOD CULTURE RECEIVED NO GROWTH TO DATE CULTURE WILL BE HELD FOR 5 DAYS BEFORE ISSUING A FINAL NEGATIVE REPORT   Report Status PENDING   Incomplete   CULTURE, BLOOD (ROUTINE X 2)     Status: Normal (Preliminary result)   Collection Time   11/07/12  4:15 PM      Component Value Range Status Comment   Specimen Description BLOOD LEFT HAND   Final    Special Requests BOTTLES DRAWN AEROBIC AND ANAEROBIC 10CC   Final     Culture  Setup Time 11/07/2012 23:29   Final    Culture     Final    Value:        BLOOD CULTURE RECEIVED NO GROWTH TO DATE CULTURE WILL BE HELD FOR 5 DAYS BEFORE ISSUING A FINAL NEGATIVE REPORT   Report Status PENDING   Incomplete      Labs: Basic Metabolic Panel:  Lab 11/08/12 1478 11/07/12 1637 11/07/12 0329  NA 139 -- 140  K 3.2* -- 3.1*  CL 105 -- 100  CO2 25 -- 26  GLUCOSE 140* -- 157*  BUN 8 -- 10  CREATININE 0.83 0.91 0.96  CALCIUM 8.2* -- 9.1  MG -- -- --  PHOS -- -- --   Liver Function Tests:  Lab 11/08/12 0233 11/07/12 0329  AST 16 14  ALT 15 14  ALKPHOS 51 69  BILITOT 0.2* 0.2*  PROT 5.4* 6.5  ALBUMIN 2.7* 3.4*   No results found for this basename: LIPASE:5,AMYLASE:5 in the last 168 hours No results found for this basename: AMMONIA:5 in the last 168 hours CBC:  Lab 11/08/12 0233 11/07/12 1637 11/07/12 0329  WBC 7.8 11.4* 5.3  NEUTROABS -- -- --  HGB 9.8* 10.8* 11.0*  HCT 28.3* 30.9* 33.1*  MCV 84.0 84.0 85.8  PLT 221 240 221   Cardiac Enzymes:  Lab 11/08/12 0233 11/07/12 2026 11/07/12 1637 11/07/12 1225  CKTOTAL -- -- -- --  CKMB -- -- -- --  CKMBINDEX -- -- -- --  TROPONINI <0.30 <0.30 <0.30 <0.30   BNP: BNP (last 3 results) No results found for this basename: PROBNP:3 in the last 8760 hours CBG:  Lab 11/08/12 0624 11/07/12 2216 11/07/12 1627  GLUCAP 128* 118* 144*       Signed:  Penny Pia  [facility] 11/08/2012, 10:24 AM

## 2012-11-08 NOTE — Progress Notes (Signed)
  Echocardiogram 2D Echocardiogram has been performed.  [name] 11/08/2012, 12:22 PM

## 2012-11-08 NOTE — Progress Notes (Addendum)
Patient had complaints of chest pain and also pain in her right arm (stated it felt like it was broken).  Vitals obtained, BP improving at 100/51, HR still tachy at 112.  EKG showed Sinus Tach with PVCs.  Upon further assessment, the patient stated that it is sore when touching her chest; thus, this seems to be musculoskeletal.  Administered an ibuprofen and notified MD on-call.  Received order for one Percocet.  Will continue to monitor.  Arva Chafe

## 2012-11-09 NOTE — Progress Notes (Signed)
Physical Therapy Note Late note for 11-08-2012  Addendum to note by Prudencio Pair, PT on 09-Nov-2023 to add G-codes.  November 08, 2012 1601  PT G-Codes **NOT FOR INPATIENT CLASS**  Functional Assessment Tool Used clinical judgement  Functional Limitation Mobility: Walking and moving around  Mobility: Walking and Moving Around Current Status (Z6109) CI  Mobility: Walking and Moving Around Goal Status (U0454) CI  PT General Charges  $$ ACUTE PT VISIT 1 Procedure  PT Evaluation  $Initial PT Evaluation Tier I 1 Procedure    11/09/2012 Corlis Hove, PT [phone]

## 2012-11-13 LAB — CULTURE, BLOOD (ROUTINE X 2)
Culture: NO GROWTH
Culture: NO GROWTH

## 2013-03-20 ENCOUNTER — Emergency Department (HOSPITAL_COMMUNITY)
Admission: EM | Admit: 2013-03-20 | Discharge: 2013-03-22 | Disposition: A | Discharge status: Other Acute Inpatient Hospital | Payer: Medicare Other | Attending: Emergency Medicine | Admitting: Emergency Medicine

## 2013-03-20 ENCOUNTER — Encounter (HOSPITAL_COMMUNITY): Payer: Self-pay

## 2013-03-20 DIAGNOSIS — F3289 Other specified depressive episodes: Secondary | ICD-10-CM | POA: Insufficient documentation

## 2013-03-20 DIAGNOSIS — Z8669 Personal history of other diseases of the nervous system and sense organs: Secondary | ICD-10-CM | POA: Insufficient documentation

## 2013-03-20 DIAGNOSIS — F329 Major depressive disorder, single episode, unspecified: Secondary | ICD-10-CM | POA: Insufficient documentation

## 2013-03-20 DIAGNOSIS — E119 Type 2 diabetes mellitus without complications: Secondary | ICD-10-CM | POA: Insufficient documentation

## 2013-03-20 DIAGNOSIS — Z3202 Encounter for pregnancy test, result negative: Secondary | ICD-10-CM | POA: Insufficient documentation

## 2013-03-20 DIAGNOSIS — Z79899 Other long term (current) drug therapy: Secondary | ICD-10-CM | POA: Insufficient documentation

## 2013-03-20 DIAGNOSIS — I10 Essential (primary) hypertension: Secondary | ICD-10-CM | POA: Insufficient documentation

## 2013-03-20 DIAGNOSIS — F259 Schizoaffective disorder, unspecified: Secondary | ICD-10-CM

## 2013-03-20 LAB — GLUCOSE, CAPILLARY: Glucose-Capillary: 161 mg/dL — ABNORMAL HIGH (ref 70–99)

## 2013-03-20 MED ORDER — ZIPRASIDONE MESYLATE 20 MG IM SOLR
20.0000 mg | Freq: Once | INTRAMUSCULAR | Status: AC
  Administered 2013-03-20: 20 mg via INTRAMUSCULAR

## 2013-03-20 MED ORDER — LORAZEPAM 2 MG/ML IJ SOLN: 2.0000 mg | Freq: Once | INTRAMUSCULAR | Status: DC

## 2013-03-20 NOTE — ED Notes (Signed)
ZOX:[id number]<AV> Expected date:<BR> Expected time:<BR> Means of arrival:<BR> Comments:<BR> Hold for [employer] with psych pt
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
## 2013-03-21 LAB — RAPID URINE DRUG SCREEN, HOSP PERFORMED
Barbiturates: NOT DETECTED
Benzodiazepines: NOT DETECTED
Cocaine: NOT DETECTED
Tetrahydrocannabinol: NOT DETECTED

## 2013-03-21 LAB — GLUCOSE, CAPILLARY: Glucose-Capillary: 126 mg/dL — ABNORMAL HIGH (ref 70–99)

## 2013-03-21 LAB — CBC
Hemoglobin: 11.9 g/dL — ABNORMAL LOW (ref 12.0–15.0)
MCHC: 33.3 g/dL (ref 30.0–36.0)
WBC: 5.7 10*3/uL (ref 4.0–10.5)

## 2013-03-21 LAB — COMPREHENSIVE METABOLIC PANEL
ALT: 11 U/L (ref 0–35)
Albumin: 3.5 g/dL (ref 3.5–5.2)
Alkaline Phosphatase: 70 U/L (ref 39–117)
GFR calc Af Amer: >90 mL/min (ref >90)
Glucose, Bld: 150 mg/dL — ABNORMAL HIGH (ref 70–99)
Potassium: 3 mEq/L — ABNORMAL LOW (ref 3.5–5.1)
Sodium: 137 mEq/L (ref 135–145)
Total Protein: 7 g/dL (ref 6.0–8.3)

## 2013-03-21 LAB — ACETAMINOPHEN LEVEL: Acetaminophen (Tylenol), Serum: <15 ug/mL (ref 10–30)

## 2013-03-21 MED ORDER — ALUM & MAG HYDROXIDE-SIMETH 200-200-20 MG/5ML PO SUSP: 30.0000 mL | ORAL | Status: DC | PRN

## 2013-03-21 MED ORDER — IBUPROFEN 600 MG PO TABS: 600.0000 mg | ORAL_TABLET | Freq: Three times a day (TID) | ORAL | Status: DC | PRN

## 2013-03-21 MED ORDER — RISPERIDONE 1 MG PO TABS
1.0000 mg | ORAL_TABLET | Freq: Every day | ORAL | Status: DC
  Administered 2013-03-21: 1 mg via ORAL
  Filled 2013-03-21: qty 1

## 2013-03-21 MED ORDER — ONDANSETRON HCL 4 MG PO TABS: 4.0000 mg | ORAL_TABLET | Freq: Three times a day (TID) | ORAL | Status: DC | PRN

## 2013-03-21 MED ORDER — VENLAFAXINE HCL ER 150 MG PO CP24
150.0000 mg | ORAL_CAPSULE | Freq: Every day | ORAL | Status: DC
  Administered 2013-03-21: 150 mg via ORAL
  Filled 2013-03-21 (×3): qty 1

## 2013-03-21 MED ORDER — LORAZEPAM 1 MG PO TABS: 1.0000 mg | ORAL_TABLET | Freq: Three times a day (TID) | ORAL | Status: DC | PRN

## 2013-03-21 MED ORDER — METFORMIN HCL 500 MG PO TABS
500.0000 mg | ORAL_TABLET | Freq: Two times a day (BID) | ORAL | Status: DC
  Administered 2013-03-21 (×2): 500 mg via ORAL
  Filled 2013-03-21 (×5): qty 1

## 2013-03-21 MED ORDER — ACETAMINOPHEN 325 MG PO TABS: 650.0000 mg | ORAL_TABLET | ORAL | Status: DC | PRN

## 2013-03-21 MED ORDER — INSULIN ASPART 100 UNIT/ML ~~LOC~~ SOLN
0.0000 [IU] | Freq: Three times a day (TID) | SUBCUTANEOUS | Status: DC
  Administered 2013-03-21 (×2): 2 [IU] via SUBCUTANEOUS
  Filled 2013-03-21: qty 1
  Filled 2013-03-21: qty 2

## 2013-03-21 MED ORDER — NICOTINE 21 MG/24HR TD PT24: 21.0000 mg | MEDICATED_PATCH | Freq: Every day | TRANSDERMAL | Status: DC

## 2013-03-21 MED ORDER — POTASSIUM CHLORIDE CRYS ER 20 MEQ PO TBCR
40.0000 meq | EXTENDED_RELEASE_TABLET | Freq: Once | ORAL | Status: AC
  Administered 2013-03-21: 40 meq via ORAL
  Filled 2013-03-21: qty 2

## 2013-03-21 MED ORDER — VENLAFAXINE HCL ER 75 MG PO CP24
75.0000 mg | ORAL_CAPSULE | Freq: Every day | ORAL | Status: DC
  Administered 2013-03-21: 75 mg via ORAL
  Filled 2013-03-21 (×2): qty 1

## 2013-03-21 MED ORDER — PANTOPRAZOLE SODIUM 20 MG PO TBEC
20.0000 mg | DELAYED_RELEASE_TABLET | Freq: Every day | ORAL | Status: DC
  Administered 2013-03-21: 20 mg via ORAL
  Filled 2013-03-21 (×2): qty 1

## 2013-03-21 MED ORDER — ZOLPIDEM TARTRATE 5 MG PO TABS
5.0000 mg | ORAL_TABLET | Freq: Every evening | ORAL | Status: DC | PRN
  Administered 2013-03-21: 5 mg via ORAL
  Filled 2013-03-21: qty 1

## 2013-03-21 NOTE — ED Provider Notes (Signed)
Patient IVC. Telemetry psych is recommended inpatient treatment. She was sleeping this morning.  [name]. Rubin Payor, MD 03/21/13 [phone]

## 2013-03-21 NOTE — ED Notes (Signed)
D: patient in bed during this assessment, Her mood and affect flat and depressed. Patient reported that she would like to talk to Child psychotherapist and that she planned to follow up with [facility]. She denied SI/HI and denied Hallucinations. Mood and affect flat and sad. She requested for something to drink. A: Writer encouraged patient to drink diet coke as oppose to the regular coke which she requested because she is diabetic. R: Patient received the diet coke and appreciative of care. Q 15 minute check continues as ordered to maintain safety.

## 2013-03-21 NOTE — ED Notes (Signed)
Patient denies pain and is resting comfortably.  
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
## 2013-03-21 NOTE — ED Notes (Signed)
Telepsych faxed and called in.

## 2013-03-21 NOTE — ED Notes (Signed)
Pt. Belongings were take home by her mother. Pt. Went over with her hearing aids in ears.

## 2013-03-21 NOTE — ED Notes (Signed)
Patient is resting comfortably. 

## 2013-03-22 NOTE — ED Notes (Signed)
Patient resting quietly with eyes closed. Respirations even and unlabored. No distress noted . Q 15 minute check continues as ordered to maintain safety. 

## 2013-03-22 NOTE — ED Notes (Signed)
Writer called in report to Cyndi,RN at [facility] and also called Sheriff for transportation.

## 2013-03-22 NOTE — ED Notes (Signed)
Patient denies pain and is resting comfortably. No distress noted 
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
## 2013-04-25 ENCOUNTER — Other Ambulatory Visit (HOSPITAL_COMMUNITY): Payer: Self-pay | Admitting: Family Medicine

## 2013-04-25 DIAGNOSIS — Z1231 Encounter for screening mammogram for malignant neoplasm of breast: Secondary | ICD-10-CM

## 2013-04-30 ENCOUNTER — Ambulatory Visit (HOSPITAL_COMMUNITY): Payer: Medicare Other | Attending: Family Medicine

## 2013-08-09 ENCOUNTER — Other Ambulatory Visit (HOSPITAL_COMMUNITY): Payer: Self-pay | Admitting: Internal Medicine

## 2013-08-09 DIAGNOSIS — Z1231 Encounter for screening mammogram for malignant neoplasm of breast: Secondary | ICD-10-CM

## 2013-08-14 ENCOUNTER — Ambulatory Visit (HOSPITAL_COMMUNITY)
Admission: RE | Admit: 2013-08-14 | Discharge: 2013-08-14 | Disposition: A | Discharge status: Home / Self Care | Payer: Medicare Other | Source: Ambulatory Visit | Attending: Internal Medicine | Admitting: Internal Medicine

## 2013-08-14 DIAGNOSIS — Z1231 Encounter for screening mammogram for malignant neoplasm of breast: Secondary | ICD-10-CM | POA: Insufficient documentation

## 2013-08-20 ENCOUNTER — Other Ambulatory Visit: Payer: Self-pay | Admitting: Internal Medicine

## 2013-08-20 DIAGNOSIS — R928 Other abnormal and inconclusive findings on diagnostic imaging of breast: Secondary | ICD-10-CM

## 2013-09-07 ENCOUNTER — Other Ambulatory Visit: Payer: Self-pay | Admitting: Internal Medicine

## 2013-09-07 ENCOUNTER — Ambulatory Visit
Admission: RE | Admit: 2013-09-07 | Discharge: 2013-09-07 | Disposition: A | Discharge status: Home / Self Care | Payer: Medicare Other | Source: Ambulatory Visit | Attending: Internal Medicine | Admitting: Internal Medicine

## 2013-09-07 DIAGNOSIS — R928 Other abnormal and inconclusive findings on diagnostic imaging of breast: Secondary | ICD-10-CM

## 2013-09-07 DIAGNOSIS — R921 Mammographic calcification found on diagnostic imaging of breast: Secondary | ICD-10-CM

## 2013-09-13 ENCOUNTER — Ambulatory Visit
Admission: RE | Admit: 2013-09-13 | Discharge: 2013-09-13 | Disposition: A | Discharge status: Home / Self Care | Payer: Medicare Other | Source: Ambulatory Visit | Attending: Internal Medicine | Admitting: Internal Medicine

## 2013-09-13 DIAGNOSIS — R921 Mammographic calcification found on diagnostic imaging of breast: Secondary | ICD-10-CM

## 2013-10-26 ENCOUNTER — Encounter (HOSPITAL_COMMUNITY): Payer: Self-pay | Admitting: Emergency Medicine

## 2013-10-26 ENCOUNTER — Emergency Department (INDEPENDENT_AMBULATORY_CARE_PROVIDER_SITE_OTHER): Payer: Self-pay

## 2013-10-26 ENCOUNTER — Emergency Department (INDEPENDENT_AMBULATORY_CARE_PROVIDER_SITE_OTHER)
Admission: EM | Admit: 2013-10-26 | Discharge: 2013-10-26 | Disposition: A | Discharge status: Home / Self Care | Payer: Medicare Other | Source: Home / Self Care | Attending: Family Medicine | Admitting: Family Medicine

## 2013-10-26 DIAGNOSIS — B349 Viral infection, unspecified: Secondary | ICD-10-CM

## 2013-10-26 DIAGNOSIS — B9789 Other viral agents as the cause of diseases classified elsewhere: Secondary | ICD-10-CM

## 2013-10-26 MED ORDER — ONDANSETRON 4 MG PO TBDP
ORAL_TABLET | ORAL | Status: AC
  Filled 2013-10-26: qty 1

## 2013-10-26 MED ORDER — ONDANSETRON 4 MG PO TBDP: 4.0000 mg | ORAL_TABLET | Freq: Three times a day (TID) | ORAL | Status: DC | PRN

## 2013-10-26 MED ORDER — BENZONATATE 100 MG PO CAPS: 100.0000 mg | ORAL_CAPSULE | Freq: Three times a day (TID) | ORAL | Status: DC

## 2013-10-26 MED ORDER — ONDANSETRON 4 MG PO TBDP
4.0000 mg | ORAL_TABLET | Freq: Once | ORAL | Status: AC
  Administered 2013-10-26: 4 mg via ORAL

## 2013-10-26 NOTE — ED Notes (Signed)
C/o vomiting, cant keep food or liquids down. Hot/cold chills.  Denies fever and diarrhea.  No meds taken for symptoms.   On set yesterday.

## 2013-10-26 NOTE — ED Provider Notes (Signed)
Medical screening examination/treatment/procedure(s) were performed by resident physician or non-physician practitioner and as supervising physician I was immediately available for consultation/collaboration.   Senon Nixon DOUGLAS MD.   Doyl Bitting D Wilkins Elpers, MD 10/26/13 2037 

## 2013-10-26 NOTE — ED Provider Notes (Signed)
CSN: [id number]     Arrival date & time 10/26/13  1521 History   First MD Initiated Contact with Patient 10/26/13 1632     Chief Complaint  Patient presents with  . Influenza   (Consider location/radiation/quality/duration/timing/severity/associated sxs/prior Treatment) Patient is a 42 y.o. female presenting with flu symptoms. The history is provided by the patient. No language interpreter was used.  Influenza Presenting symptoms: cough, nausea and vomiting   Severity:  Moderate Onset quality:  Gradual Progression:  Worsening Chronicity:  New Relieved by:  Nothing Worsened by:  Nothing tried Ineffective treatments:  None tried Risk factors: no sick contacts     Past Medical History  Diagnosis Date  . Diabetes mellitus due to abnormal insulin   . Hypertension   . Congenital cerebral palsy   . Hearing impairment   . Schizoaffective disorder   . Depression    Past Surgical History  Procedure Laterality Date  . Cesarean section    . Exploratory laparotomy with abdominal mass excision     Family History  Problem Relation Age of Onset  . Heart failure Mother   . Hypertension Mother   . Diabetes Mellitus II Mother   . Cancer Mother   . Diabetes Mellitus II Father   . Hypertension Father    History  Substance Use Topics  . Smoking status: Never Smoker   . Smokeless tobacco: Not on file  . Alcohol Use: No   OB History   Grav Para Term Preterm Abortions TAB SAB Ect Mult Living                 Review of Systems  Respiratory: Positive for cough.   Gastrointestinal: Positive for nausea and vomiting.  All other systems reviewed and are negative.    Allergies  Ivp dye  Home Medications   Current Outpatient Rx  Name  Route  Sig  Dispense  Refill  . lisinopril-hydrochlorothiazide (PRINZIDE,ZESTORETIC) 10-12.5 MG per tablet   Oral   Take 1 tablet by mouth daily.         . metFORMIN (GLUCOPHAGE) 500 MG tablet   Oral   Take 500 mg by mouth 2 (two) times daily  with a meal.         . omeprazole (PRILOSEC) 20 MG capsule   Oral   Take 20 mg by mouth daily.         . pantoprazole (PROTONIX) 20 MG tablet   Oral   Take 1 tablet (20 mg total) by mouth daily.   30 tablet   0   . risperiDONE (RISPERDAL) 1 MG tablet   Oral   Take 1 mg by mouth daily.          [name] venlafaxine XR (EFFEXOR-XR) 75 MG 24 hr capsule   Oral   Take 75-150 mg by mouth 2 (two) times daily. TAKE ONE CAPSULE BY MOUTH IN THE MORNING AND TWO AT BEDTIME per patient          BP 111/71  Pulse 91  Temp(Src) 98.1 F (36.7 C) (Oral)  Resp 16  SpO2 100%  LMP 10/25/2013 Physical Exam  Constitutional: She appears well-developed and well-nourished.  HENT:  Head: Normocephalic.  Right Ear: External ear normal.  Left Ear: External ear normal.  Eyes: Pupils are equal, round, and reactive to light.  Neck: Normal range of motion. Neck supple.  Cardiovascular: Normal rate and normal heart sounds.   Pulmonary/Chest: Effort normal and breath sounds normal.  Abdominal: Soft.  Musculoskeletal: Normal  range of motion.  Neurological: She is alert.  Skin: Skin is warm.  Psychiatric: She has a normal mood and affect.    ED Course  Procedures (including critical care time) Labs Review Labs Reviewed - No data to display Imaging Review Dg Chest 2 View  10/26/2013   CLINICAL DATA:  Influenza. Headache, vomiting, cough, and congestion.  EXAM: CHEST  2 VIEW  COMPARISON:  11/08/2012  FINDINGS: The patient has taken a shallow inspiration. Cardiac silhouette appears mildly enlarged, accentuated by low lung volumes on the frontal projection. There is no evidence of focal airspace consolidation, edema, pleural effusion, or pneumothorax. No acute osseous abnormality is identified.  IMPRESSION: Low lung volumes.  No evidence of acute airspace disease.   Electronically Signed   By: Sebastian Ache   On: 10/26/2013 17:17    EKG Interpretation    Date/Time:    Ventricular Rate:    PR  Interval:    QRS Duration:   QT Interval:    QTC Calculation:   R Axis:     Text Interpretation:              MDM Chest xray no pneumonia  Pt given zofran odt and fluids orally.   Pt given rx for zofran and tessalon perles   1. Viral illness        Elson Areas, [place] 10/26/13 1610

## 2013-11-28 ENCOUNTER — Encounter (HOSPITAL_COMMUNITY): Payer: Self-pay | Admitting: Emergency Medicine

## 2013-11-28 ENCOUNTER — Emergency Department (INDEPENDENT_AMBULATORY_CARE_PROVIDER_SITE_OTHER)
Admission: EM | Admit: 2013-11-28 | Discharge: 2013-11-28 | Disposition: A | Discharge status: Home / Self Care | Payer: Medicare Other | Source: Home / Self Care | Attending: Emergency Medicine | Admitting: Emergency Medicine

## 2013-11-28 ENCOUNTER — Emergency Department (INDEPENDENT_AMBULATORY_CARE_PROVIDER_SITE_OTHER): Payer: Self-pay

## 2013-11-28 DIAGNOSIS — M719 Bursopathy, unspecified: Secondary | ICD-10-CM

## 2013-11-28 DIAGNOSIS — M67919 Unspecified disorder of synovium and tendon, unspecified shoulder: Secondary | ICD-10-CM

## 2013-11-28 DIAGNOSIS — M7580 Other shoulder lesions, unspecified shoulder: Secondary | ICD-10-CM

## 2013-11-28 MED ORDER — HYDROCODONE-ACETAMINOPHEN 5-325 MG PO TABS
ORAL_TABLET | ORAL | Status: AC
  Filled 2013-11-28: qty 2

## 2013-11-28 MED ORDER — METHYLPREDNISOLONE ACETATE 40 MG/ML IJ SUSP
INTRAMUSCULAR | Status: AC
  Filled 2013-11-28: qty 5

## 2013-11-28 MED ORDER — HYDROCODONE-ACETAMINOPHEN 5-325 MG PO TABS
2.0000 | ORAL_TABLET | Freq: Once | ORAL | Status: AC
  Administered 2013-11-28: 2 via ORAL

## 2013-11-28 MED ORDER — MELOXICAM 15 MG PO TABS: 15.0000 mg | ORAL_TABLET | Freq: Every day | ORAL | Status: DC

## 2013-11-28 MED ORDER — OXYCODONE-ACETAMINOPHEN 5-325 MG PO TABS: ORAL_TABLET | ORAL | Status: DC

## 2013-11-28 NOTE — ED Notes (Signed)
Patient transported to X-ray 
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
## 2013-11-28 NOTE — ED Notes (Signed)
Reports left shoulder pain and pain into left shoulder blade.  Reports this pain as "tight".  Denies any injury.  Reports this pain started Sunday and she did start a new exercise routine one week ago.

## 2013-11-28 NOTE — Discharge Instructions (Signed)

## 2014-01-01 ENCOUNTER — Ambulatory Visit: Payer: Self-pay | Admitting: *Deleted

## 2014-01-04 ENCOUNTER — Encounter: Payer: Medicare Other | Attending: Family Medicine | Admitting: *Deleted

## 2014-01-04 VITALS — Ht 64.0 in | Wt 221.5 lb

## 2014-01-04 DIAGNOSIS — E119 Type 2 diabetes mellitus without complications: Secondary | ICD-10-CM | POA: Insufficient documentation

## 2014-01-04 NOTE — Progress Notes (Signed)
Appt start time: 1300 end time:  1430.  Assessment:  Patient was seen on  01/04/14 for individual diabetes education. Lives with Mom, son and nephew. They share in the food shopping and preparation. She is not SMBG yet. She does not work, she is on computer, likes to read, goes to [employer] program once a week on various days. She walks when the weather is nicer.  Current HbA1c: 7.3%  Preferred Learning Style:   Auditory  Learning Readiness:  Contemplating  MEDICATIONS: see list, diabetes medication is Metformin  DIETARY INTAKE: Avoided foods include pork, fatty meats.    24-hr recall:  B ( AM): skips if not hungry, sausage biscuit with egg, tator tots @ fast food OR grits, eggs, sausage and bacon, water or Kool-aid with sugar added  Snk ( AM): occasionally graham crackers OR unsalted peanuts  L ( PM): may skip if not hungry OR salad with cheese, sometimes lean meat, lettuce croutons, raisins or other fruit, [place] or [employer],  Snk ( PM): same as AM OR fresh fruit D ( PM): lean meat, occasionally a starch, vegetables Snk ( PM): graham crackers OR popcorn Beverages: water or Kool-aid with sugar added, herbal tea   Usual physical activity: not unless weather is warmer, then she will walk  Estimated energy needs: 1500 calories 170 g carbohydrates 112 g protein 42 g fat    Intervention:  Nutrition counseling provided.  Discussed physiology of diabetes and role of obesity on insulin resistance.  Encouraged moderate weight reduction to improve glucose levels.  Discussed role of medications and diet in glucose control   Provided education on carb counting, importance of regularly scheduled meals/snacks, and meal planning using the Plate Method  Discussed effects of physical activity on glucose levels and long-term glucose control.  Encouraged her to walk daily as able.  Reviewed patient medications.  Discussed role of medication on blood glucose and possible side  effects  Discussed blood glucose monitoring and interpretation.  Discussed recommended target ranges and individual ranges.    Plan to discuss at next visit:  Described short-term complications: hyper- and hypo-glycemia.  Discussed causes,symptoms, and treatment options.  Discussed prevention, detection, and treatment of long-term complications.  Discussed the role of prolonged elevated glucose levels on body systems.  Discussed role of stress on blood glucose levels and discussed strategies to manage psychosocial issues.  Discussed recommendations for long-term diabetes self-care.  Established checklist for medical, dental, and emotional self-care.  Plan:  Aim for 3 Carb Choices per meal (45 grams) +/- 1 either way  Aim for 0-1 Carbs per snack if hungry  Consider increasing your activity level by taking the stairs, dancing or walking (when weather is safe) to help with weight loss and BG control Follow up with [employer] about the Accu Chek Aviva Meter that Medicaid will pay for   Teaching Method Utilized: Visual, Auditory and Hands on  Handouts given during visit include: Living Well with Diabetes Carb Counting and Food Label handouts Meal Plan Card  Barriers to learning/adherence to lifestyle change: history of depression  Diabetes self-care support plan:   [facility] support group flyer provided  Demonstrated degree of understanding via:  Teach Back   Monitoring/Evaluation:  Dietary intake, exercise, and body weight in 6 week(s).

## 2014-01-04 NOTE — Patient Instructions (Signed)
Plan:  Aim for 3 Carb Choices per meal (45 grams) +/- 1 either way  Aim for 0-1 Carbs per snack if hungry  Consider increasing your activity level by taking the stairs, dancing or walking (when weather is safe) to help with weight loss and BG control Follow up with [employer] about the Accu Chek Aviva Meter that Medicaid will pay for

## 2014-02-21 ENCOUNTER — Ambulatory Visit: Payer: Self-pay | Admitting: *Deleted

## 2014-07-15 ENCOUNTER — Ambulatory Visit (INDEPENDENT_AMBULATORY_CARE_PROVIDER_SITE_OTHER): Payer: Medicare Other | Admitting: Family Medicine

## 2014-07-15 ENCOUNTER — Ambulatory Visit (INDEPENDENT_AMBULATORY_CARE_PROVIDER_SITE_OTHER): Payer: Medicare Other

## 2014-07-15 VITALS — BP 110/70 | HR 100 | Temp 98.5°F | Resp 16 | Ht 64.0 in | Wt 216.0 lb

## 2014-07-15 DIAGNOSIS — M79609 Pain in unspecified limb: Secondary | ICD-10-CM

## 2014-07-15 DIAGNOSIS — M25579 Pain in unspecified ankle and joints of unspecified foot: Secondary | ICD-10-CM

## 2014-07-15 DIAGNOSIS — R55 Syncope and collapse: Secondary | ICD-10-CM

## 2014-07-15 DIAGNOSIS — M79672 Pain in left foot: Secondary | ICD-10-CM

## 2014-07-15 DIAGNOSIS — M25572 Pain in left ankle and joints of left foot: Secondary | ICD-10-CM

## 2014-07-15 NOTE — Progress Notes (Signed)
[name] is a 43 y.o. female who presents to Urgent Care today with complaints of Left foot, toe, and ankle pain:  1.  LLE pain:  Patient was at the [facility] when she had what sounds like a syncopal episode.  States she was talking with her family and then had LOC.  Fell to ground.  No prodromal symptoms.  No palpitations, chest pain, tunnel vision.  Denies any history of chest pain.  No orthopnea.    States was only "out" for about 30 seconds based on what her family said.    While walking to her car, she had foot pain in Left foot, ankle, and great toe.  States pain in these areas has been increasing since that time.  Has been wearing sandals since then.  Walks with limp.  She is unsure mechanism of injury  PMH reviewed.  Past Medical History  Diagnosis Date  . Diabetes mellitus due to abnormal insulin   . Hypertension   . Congenital cerebral palsy   . Hearing impairment   . Schizoaffective disorder   . Depression    Past Surgical History  Procedure Laterality Date  . Cesarean section    . Exploratory laparotomy with abdominal mass excision      Medications reviewed. Current Outpatient Prescriptions  Medication Sig Dispense Refill  . benzonatate (TESSALON) 100 MG capsule Take 1 capsule (100 mg total) by mouth every 8 (eight) hours.  21 capsule  0  . lisinopril-hydrochlorothiazide (PRINZIDE,ZESTORETIC) 10-12.5 MG per tablet Take 1 tablet by mouth daily.      . metFORMIN (GLUCOPHAGE) 500 MG tablet Take 500 mg by mouth 2 (two) times daily with a meal.      . omeprazole (PRILOSEC) 20 MG capsule Take 20 mg by mouth daily.      [name] venlafaxine XR (EFFEXOR-XR) 75 MG 24 hr capsule Take 75-150 mg by mouth 2 (two) times daily. TAKE ONE CAPSULE BY MOUTH IN THE MORNING AND TWO AT BEDTIME per patient       No current facility-administered medications for this visit.    ROS as above otherwise neg.  No chest pain, palpitations, SOB, Fever, Chills, Abd pain, N/V/D.   Physical  Exam:  BP 110/70  Pulse 100  Temp(Src) 98.5 F (36.9 C) (Oral)  Resp 16  Ht 5\' 4"  (1.626 m)  Wt 216 lb (97.977 kg)  BMI 37.06 kg/m2  SpO2 99%  LMP 06/24/2014 Gen:  Alert, cooperative patient who appears stated age in no acute distress.  Vital signs reviewed. MSK:  Left ankle with some edema noted, no gross deformity.  Also with some swelling without redness or skin changes 1st MTP on Left.  TTP along 1st MTP joint, across dorsum of foot, and with inversion of Left ankle.  No pain with eversion.  No syndesmotic pain.  Able to bear weight walking up and down hallway.    [facility] reading (PRIMARY) by  Dr. Mingo Amber:  No fractures noted Left MTP joint, bones of Left foot, or distal tibia/fibula.     Assessment and Plan:  1.  Left foot and toe pain: - Unclear how this occurred.  Secondary to fall per patient - No fractures noted - I have put her in a post-op shoe for relief for 2 weeks.  I have discussed she needs to wear tennis shoes for support for roughly 2 weeks after that.    2.  Left ankle pain: - Likely sprain. - Again, no fracture noted.   - Post-op  shoe should help with this.  She has ankle brace at home already -- recommended to wear this along with shoe.  Given home exercises for ankle strengthening.  3.  Syncope: - first time, no prodrome.   - does have slight developmental delay as noticed in her chart.  Will refer to cardiology for possible Holter monitor.  No other evidence of cardiogenic or other cause of syncope    Alveda Reasons, MD

## 2014-07-30 ENCOUNTER — Ambulatory Visit (INDEPENDENT_AMBULATORY_CARE_PROVIDER_SITE_OTHER): Payer: Medicare Other | Admitting: Internal Medicine

## 2014-07-30 ENCOUNTER — Encounter: Payer: Self-pay | Admitting: Internal Medicine

## 2014-07-30 VITALS — BP 125/76 | HR 92 | Temp 97.6°F | Wt 213.8 lb

## 2014-07-30 DIAGNOSIS — Z124 Encounter for screening for malignant neoplasm of cervix: Secondary | ICD-10-CM | POA: Insufficient documentation

## 2014-07-30 DIAGNOSIS — S93402A Sprain of unspecified ligament of left ankle, initial encounter: Secondary | ICD-10-CM | POA: Insufficient documentation

## 2014-07-30 DIAGNOSIS — Z9189 Other specified personal risk factors, not elsewhere classified: Secondary | ICD-10-CM

## 2014-07-30 DIAGNOSIS — F3289 Other specified depressive episodes: Secondary | ICD-10-CM

## 2014-07-30 DIAGNOSIS — S93402D Sprain of unspecified ligament of left ankle, subsequent encounter: Secondary | ICD-10-CM

## 2014-07-30 DIAGNOSIS — F329 Major depressive disorder, single episode, unspecified: Secondary | ICD-10-CM

## 2014-07-30 DIAGNOSIS — E041 Nontoxic single thyroid nodule: Secondary | ICD-10-CM

## 2014-07-30 DIAGNOSIS — I10 Essential (primary) hypertension: Secondary | ICD-10-CM

## 2014-07-30 DIAGNOSIS — E119 Type 2 diabetes mellitus without complications: Secondary | ICD-10-CM

## 2014-07-30 DIAGNOSIS — Z5189 Encounter for other specified aftercare: Secondary | ICD-10-CM

## 2014-07-30 DIAGNOSIS — S93409A Sprain of unspecified ligament of unspecified ankle, initial encounter: Secondary | ICD-10-CM

## 2014-07-30 DIAGNOSIS — Z87898 Personal history of other specified conditions: Secondary | ICD-10-CM | POA: Insufficient documentation

## 2014-07-30 DIAGNOSIS — E669 Obesity, unspecified: Secondary | ICD-10-CM | POA: Insufficient documentation

## 2014-07-30 DIAGNOSIS — Z23 Encounter for immunization: Secondary | ICD-10-CM

## 2014-07-30 DIAGNOSIS — F32A Depression, unspecified: Secondary | ICD-10-CM

## 2014-07-30 DIAGNOSIS — Z0000 Encounter for general adult medical examination without abnormal findings: Secondary | ICD-10-CM | POA: Insufficient documentation

## 2014-07-30 HISTORY — DX: Nontoxic single thyroid nodule: E04.1

## 2014-07-30 LAB — MICROALBUMIN / CREATININE URINE RATIO
Creatinine, Urine: 112.8 mg/dL
MICROALB UR: 0.67 mg/dL (ref 0.00–1.89)
MICROALB/CREAT RATIO: 5.9 mg/g (ref 0.0–30.0)

## 2014-07-30 LAB — LIPID PANEL
Cholesterol: 165 mg/dL (ref 0–200)
HDL: 47 mg/dL (ref >39)
LDL Cholesterol: 101 mg/dL — ABNORMAL HIGH (ref 0–99)
Total CHOL/HDL Ratio: 3.5 Ratio
Triglycerides: 84 mg/dL (ref <150)
VLDL: 17 mg/dL (ref 0–40)

## 2014-07-30 LAB — BASIC METABOLIC PANEL WITH GFR
BUN: 6 mg/dL (ref 6–23)
CO2: 30 mEq/L (ref 19–32)
CREATININE: 0.67 mg/dL (ref 0.50–1.10)
Calcium: 9.3 mg/dL (ref 8.4–10.5)
Chloride: 97 mEq/L (ref 96–112)
Glucose, Bld: 91 mg/dL (ref 70–99)
POTASSIUM: 3.5 meq/L (ref 3.5–5.3)
Sodium: 135 mEq/L (ref 135–145)

## 2014-07-30 LAB — GLUCOSE, CAPILLARY: Glucose-Capillary: 114 mg/dL — ABNORMAL HIGH (ref 70–99)

## 2014-07-30 NOTE — Patient Instructions (Signed)
General Instructions: -It was a pleasure meeting you today.  -Please make a follow up appointment in 1 month with Dr. Arcelia Jew, your new primary care provider.   Thank you for bringing your medicines today. This helps [place] keep you safe from mistakes.  Treatment Goals:  Goals (1 Years of Data) as of 07/30/14         As of Today 07/15/14 11/28/13 10/26/13 03/22/13     Blood Pressure    . Blood Pressure < 140/90  125/76 110/70 121/64 111/71 121/81     Result Component    . HEMOGLOBIN A1C < 7.0            Progress Toward Treatment Goals:  Treatment Goal 07/30/2014  Hemoglobin A1C at goal  Blood pressure at goal    Self Care Goals & Plans:  Self Care Goal 07/30/2014  Manage my medications take my medicines as prescribed; refill my medications on time; bring my medications to every visit  Meeting treatment goals maintain the current self-care plan    Home Blood Glucose Monitoring 07/30/2014  Check my blood sugar no home glucose monitoring     Care Management & Community Referrals:  Referral 07/30/2014  Referrals made for care management support none needed     Ankle Sprain An ankle sprain is an injury to the strong, fibrous tissues (ligaments) that hold the bones of your ankle joint together.  CAUSES An ankle sprain is usually caused by a fall or by twisting your ankle. Ankle sprains most commonly occur when you step on the outer edge of your foot, and your ankle turns inward. People who participate in sports are more prone to these types of injuries.  SYMPTOMS   Pain in your ankle. The pain may be present at rest or only when you are trying to stand or walk.  Swelling.  Bruising. Bruising may develop immediately or within 1 to 2 days after your injury.  Difficulty standing or walking, particularly when turning corners or changing directions. DIAGNOSIS  Your caregiver will ask you details about your injury and perform a physical exam of your ankle to determine if you have an  ankle sprain. During the physical exam, your caregiver will press on and apply pressure to specific areas of your foot and ankle. Your caregiver will try to move your ankle in certain ways. An X-ray exam may be done to be sure a bone was not broken or a ligament did not separate from one of the bones in your ankle (avulsion fracture).  TREATMENT  Certain types of braces can help stabilize your ankle. Your caregiver can make a recommendation for this. Your caregiver may recommend the use of medicine for pain. If your sprain is severe, your caregiver may refer you to a surgeon who helps to restore function to parts of your skeletal system (orthopedist) or a physical therapist. [place]ino ice to your injury for 1-2 days or as directed by your caregiver. Applying ice helps to reduce inflammation and pain.  Put ice in a plastic bag.  Place a towel between your skin and the bag.  Leave the ice on for 15-20 minutes at a time, every 2 hours while you are awake.  Only take over-the-counter or prescription medicines for pain, discomfort, or fever as directed by your caregiver.  Elevate your injured ankle above the level of your heart as much as possible for 2-3 days.  If your caregiver recommends crutches, use them as instructed. Gradually  put weight on the affected ankle. Continue to use crutches or a cane until you can walk without feeling pain in your ankle.  If you have a plaster splint, wear the splint as directed by your caregiver. Do not rest it on anything harder than a pillow for the first 24 hours. Do not put weight on it. Do not get it wet. You may take it off to take a shower or bath.  You may have been given an elastic bandage to wear around your ankle to provide support. If the elastic bandage is too tight (you have numbness or tingling in your foot or your foot becomes cold and blue), adjust the bandage to make it comfortable.  If you have an air splint, you may blow  more air into it or let air out to make it more comfortable. You may take your splint off at night and before taking a shower or bath. Wiggle your toes in the splint several times per day to decrease swelling. SEEK MEDICAL CARE IF:   You have rapidly increasing bruising or swelling.  Your toes feel extremely cold or you lose feeling in your foot.  Your pain is not relieved with medicine. SEEK IMMEDIATE MEDICAL CARE IF:  Your toes are numb or blue.  You have severe pain that is increasing. MAKE SURE YOU:   Understand these instructions.  Will watch your condition.  Will get help right away if you are not doing well or get worse. Document Released: 11/01/2005 Document Revised: 07/26/2012 Document Reviewed: 11/13/2011 [facility] Patient Information 2015 [place], [place]. This information is not intended to replace advice given to you by your health care provider. Make sure you discuss any questions you have with your health care provider.

## 2014-07-30 NOTE — Assessment & Plan Note (Addendum)
Lab Results  Component Value Date   HGBA1C 7.2* 11/07/2012   HGBA1C 6.3* 11/21/2009   HGBA1C 6.7* 10/14/2009     Assessment: Diabetes control: good control (HgbA1C at goal) Progress toward A1C goal:  at goal Comments: She is on metformin 500mg  BID ac.   Plan: Medications:  continue current medications Home glucose monitoring: Frequency: no home glucose monitoring Timing:   Instruction/counseling given: reminded to get eye exam, reminded to bring medications to each visit and discussed foot care Educational resources provided:   Self management tools provided:   Other plans: Checked lipid panel today. Will need foot exam during next visit. Needs diabetic eye exam but retina scanner not available today and could not refer to Ophthalmology today 2/2 Medicaid card not reflecting new PCP. Will need HgA1C during next visit. Checked urine micro/Cr today.

## 2014-07-30 NOTE — Progress Notes (Signed)
   Subjective:    Patient ID: [name], female    DOB: 11-May-1971, 43 y.o.   MRN: [id number]  HPI Ms. Strahm is a 43 year old woman with PMH of HTN, DM2, hearing impairment, presenting to establish care that the [facility].  She states that she had a syncope about 2 weeks ago and sprained her left ankle. She at first thought they had told her she had a "mass" or a "clot" in her foot but she is not sure this is what they told her. She was given orthopedic shoe which she has been wearing and has had improvement of her left ankle swelling and left dorsal foot pain and discoloration.  She saw Dr. Einar Gip in Cardiology yesterday and had the 30-day Holter monitor placement as part of the work up for her syncope.  She denies dizziness, lightheadedness, or recurrent syncope since that event but has had similar episodes in the past.    Review of Systems  Constitutional: Negative for fever, chills, diaphoresis, activity change, appetite change, fatigue and unexpected weight change.  Respiratory: Negative for cough, shortness of breath and wheezing.   Cardiovascular: Negative for chest pain, palpitations and leg swelling.  Gastrointestinal: Negative for abdominal pain.  Musculoskeletal: Positive for arthralgias.  Skin: Negative for color change and rash.  Neurological: Negative for dizziness, syncope, weakness and light-headedness.  Psychiatric/Behavioral: Negative for agitation.       Objective:   Physical Exam  Nursing note and vitals reviewed. Constitutional: She is oriented to person, place, and time. She appears well-developed and well-nourished. No distress.  Morbidly obese  HENT:  Head: Atraumatic.  Wearing hearing aid   Eyes: Conjunctivae are normal. No scleral icterus.  Cardiovascular: Normal rate.   Murmur heard. 2/6 holosystolic murmur heard best at the LUSB  Pulmonary/Chest: Effort normal. No respiratory distress. She has no wheezes. She has no rales.  Abdominal: Soft. There is no  tenderness.  Musculoskeletal: She exhibits tenderness. She exhibits no edema.  Left foot: with orthotic shoe.  Mild TTP of dorsal foot near 1st toe.  Nl ROM, no discoloration, no ankle edema. Joint is stable  Neurological: She is alert and oriented to person, place, and time. Coordination normal.  Skin: Skin is warm and dry. No rash noted. She is not diaphoretic. No erythema.  Psychiatric: She has a normal mood and affect. Her behavior is normal.          Assessment & Plan:

## 2014-07-30 NOTE — Assessment & Plan Note (Signed)
She is trying to lose weight, doing protein shakes

## 2014-07-30 NOTE — Assessment & Plan Note (Signed)
She received the flu vaccine during this visit.  She wants Tdap during her next visit.  She is due for a Pap smear, to be scheduled with her new PCP.

## 2014-07-30 NOTE — Assessment & Plan Note (Addendum)
Checking TSH.  Will need thyroid US ordered during her next visit.

## 2014-07-30 NOTE — Assessment & Plan Note (Signed)
Her pain is improving, ankle swelling and toe discoloration have resolved. Xray of foot from 8/31 negative for fracture/dislocation.  RICE tx, provided pt wit leaflet for home care.

## 2014-07-30 NOTE — Assessment & Plan Note (Addendum)
Was seen on 8/31 at [facility] (FM note in [place]). Though to be 2/2 to prolonged sitting v cardiac arrhythmia. Was referred to Cardiology for Holter monitor, has seen Dr. Einar Gip and has 30day-Holter monitor in place.  Wills follow up with Cardiology on 9/21, 10/6, 10/26

## 2014-07-30 NOTE — Assessment & Plan Note (Signed)
On Effexor. Followed at [facility]. Not taking Latuda. Stopped Trazadone (for insomnia) due to concerns for dizziness/syncope

## 2014-07-30 NOTE — Assessment & Plan Note (Signed)
BP Readings from Last 3 Encounters:  07/30/14 125/76  07/15/14 110/70  11/28/13 121/64    Lab Results  Component Value Date   NA 137 03/21/2013   K 3.0* 03/21/2013   CREATININE 0.69 03/21/2013    Assessment: Blood pressure control:  Controlled Progress toward BP goal:   at goal Comments: She is on lisinopril-HCTZ 10-12.5mg  daily.   Plan: Medications:  continue current medications Educational resources provided:   Self management tools provided:   Other plans: Follow up in 1 month.

## 2014-07-31 LAB — TSH: TSH: 1.401 u[IU]/mL (ref 0.350–4.500)

## 2014-08-05 NOTE — Progress Notes (Signed)
Internal Medicine Clinic Attending  Case discussed with Dr. Kennerly soon after the resident saw the patient.  We reviewed the resident's history and exam and pertinent patient test results.  I agree with the assessment, diagnosis, and plan of care documented in the resident's note.  

## 2014-09-10 ENCOUNTER — Ambulatory Visit (INDEPENDENT_AMBULATORY_CARE_PROVIDER_SITE_OTHER): Payer: Medicare Other | Admitting: Internal Medicine

## 2014-09-10 ENCOUNTER — Encounter: Payer: Self-pay | Admitting: Internal Medicine

## 2014-09-10 VITALS — BP 131/79 | HR 92 | Temp 98.4°F | Ht 64.0 in | Wt 210.0 lb

## 2014-09-10 DIAGNOSIS — E119 Type 2 diabetes mellitus without complications: Secondary | ICD-10-CM | POA: Diagnosis present

## 2014-09-10 DIAGNOSIS — R011 Cardiac murmur, unspecified: Secondary | ICD-10-CM | POA: Diagnosis not present

## 2014-09-10 DIAGNOSIS — J309 Allergic rhinitis, unspecified: Secondary | ICD-10-CM

## 2014-09-10 DIAGNOSIS — E785 Hyperlipidemia, unspecified: Secondary | ICD-10-CM

## 2014-09-10 DIAGNOSIS — R5383 Other fatigue: Secondary | ICD-10-CM

## 2014-09-10 DIAGNOSIS — E041 Nontoxic single thyroid nodule: Secondary | ICD-10-CM

## 2014-09-10 DIAGNOSIS — Z0000 Encounter for general adult medical examination without abnormal findings: Secondary | ICD-10-CM

## 2014-09-10 DIAGNOSIS — I10 Essential (primary) hypertension: Secondary | ICD-10-CM

## 2014-09-10 DIAGNOSIS — R5381 Other malaise: Secondary | ICD-10-CM

## 2014-09-10 DIAGNOSIS — Z23 Encounter for immunization: Secondary | ICD-10-CM

## 2014-09-10 LAB — POCT GLYCOSYLATED HEMOGLOBIN (HGB A1C): HEMOGLOBIN A1C: 6.9

## 2014-09-10 LAB — GLUCOSE, CAPILLARY: Glucose-Capillary: 84 mg/dL (ref 70–99)

## 2014-09-10 LAB — HM DIABETES EYE EXAM

## 2014-09-10 MED ORDER — PRAVASTATIN SODIUM 40 MG PO TABS: 40.0000 mg | ORAL_TABLET | Freq: Every day | ORAL | Status: DC

## 2014-09-10 NOTE — Assessment & Plan Note (Signed)
No thyroid nodule palpated on exam. Found on CT in 2011 but was not followed up. Will order thyroid U/S today.

## 2014-09-10 NOTE — Assessment & Plan Note (Signed)
BP Readings from Last 3 Encounters:  09/10/14 131/79  07/30/14 125/76  07/15/14 110/70    Lab Results  Component Value Date   NA 135 07/30/2014   K 3.5 07/30/2014   CREATININE 0.67 07/30/2014    Assessment: Blood pressure control: controlled Progress toward BP goal:  at goal Comments:   Plan: Medications:  continue current medications Educational resources provided:   Self management tools provided:   Other plans: BP well controlled. Continue current regimen of Lisinopril-HCTZ 10-12.5 mg daily.

## 2014-09-10 NOTE — Assessment & Plan Note (Signed)
Patient reports history of a heart murmur as a child. Murmur auscultated on exam. Likely a flow murmur. Patient states she has an appointment with her cardiologist, Dr. Einar Gip, tomorrow. Will follow up visit notes.

## 2014-09-10 NOTE — Progress Notes (Signed)
   Subjective:    Patient ID: [name], female    DOB: Mar 25, 1971, 43 y.o.   MRN: [id number]  HPI Ms. Metzner is a 43yo woman w/ PMHx of HTN and Type 2 DM who presents today for the following:  1. HTN: BP today 131/79. She takes Lisinopril-HCTZ 10-12.5 mg daily.   2. Type 2 DM: Last HbA1c in records was 7.2 in 2013. Patient states she was diagnosed with diabetes 1 year ago. She was previously seen in another outpatient clinic in [place]. She states she tests her blood sugar once per day. She takes Metformin 500 mg BID. She denies polyuria, polydipsia, blurry vision, and tingling/numbness in her extremities.   3. Thyroid Nodule: CT scan from 08/27/2010 showed a 1.9 cm calcified nodule involving the lower pole of the right lobe of the thyroid gland. No follow up U/S performed. TSH was ordered at 07/30/14 visit and was normal at 1.401. Patient denies palpitations, diaphoresis, changes in weight, changes in skin/hair/nails, weakness, and fatigue.  4. Preventative Health: Patient expressed that she has a new sexual partner and would like to have a pap smear as well as STI testing. She states she has been sexually active with her female partner only in the last month. She reports condom use every time. She denies rashes, vaginal discharge, dysuria, genital sores/ulcers, and pruritis.    Review of Systems General: Denies fever, chills, night sweats, changes in appetite HEENT: Denies headaches, ear pain, changes in vision, rhinorrhea, sore throat CV: Denies CP, SOB, orthopnea Pulm: Denies SOB, cough, wheezing GI: Denies abdominal pain, nausea, vomiting, diarrhea, constipation, melena, hematochezia GU: Denies hematuria, frequency Msk: Denies muscle cramps, joint pains Neuro: Denies weakness Skin: Denies bruising    Objective:   Physical Exam General: alert, sitting up in chair, NAD HEENT: [place]/AT, EOMI, PERRL, sclera anicteric, pharynx non-erythematous, mucus membranes moist Neck: supple, no  JVD, no lymphadenopathy, no thyroid nodules palpated CV: RRR, murmur heard best at left sternal border Pulm: CTA bilaterally, breaths non-labored, no wheezing Abd: BS+, soft, non-distended, non-tender Ext: warm, no edema, moves all Neuro: alert and oriented x 3, CNs II-XII intact, strength 5/5 in upper and lower extremities bilaterally      Assessment & Plan:

## 2014-09-10 NOTE — Assessment & Plan Note (Signed)
Discussed pap smear, STI testing, and safe sex methods. Will reschedule pap smear for another visit given time contraint today.  - Appointment for pap smear, will do STI testing (gonorrhea, chlamydia, trichomonas) then - HIV today

## 2014-09-10 NOTE — Assessment & Plan Note (Signed)
Lipid profile on 07/30/14 shows Chol 165, Trigly 84, HDL 47, LDL 101. Since patient has Type 2 DM, LDL goal is < 70. Her 10 year cardiovascular risk is 5.2%.  - Start Pravastatin 40 mg daily - Benefits and side effects discussed

## 2014-09-10 NOTE — Patient Instructions (Signed)
It was a pleasure meeting you today, Ms. Owens Shark.  1. Diabetes - HbA1c today - Foot exam  - Eye exam will be scheduled - Continue Metformin 500 mg twice a day  2. Thyroid nodule - Thyroid ultrasound will be scheduled  3. Pap smear - Will schedule appointment solely for pap smear and testing  4. High LDL Cholesterol - Start Pravastatin 40mg  daily - If you develop muscle cramps or gastrointestinal symptoms please call  General Instructions:   Thank you for bringing your medicines today. This helps [place] keep you safe from mistakes.   Progress Toward Treatment Goals:  Treatment Goal 07/30/2014  Hemoglobin A1C at goal  Blood pressure at goal    Self Care Goals & Plans:  Self Care Goal 07/30/2014  Manage my medications take my medicines as prescribed; refill my medications on time; bring my medications to every visit  Meeting treatment goals maintain the current self-care plan    Home Blood Glucose Monitoring 07/30/2014  Check my blood sugar no home glucose monitoring     Care Management & Community Referrals:  Referral 07/30/2014  Referrals made for care management support none needed

## 2014-09-10 NOTE — Assessment & Plan Note (Signed)
Lab Results  Component Value Date   HGBA1C 6.9 09/10/2014   HGBA1C 7.2* 11/07/2012   HGBA1C 6.3* 11/21/2009     Assessment: Diabetes control:   Progress toward A1C goal:  unable to assess Comments:   Plan: Medications:  continue current medications Home glucose monitoring: Frequency: once a day Timing: before breakfast Instruction/counseling given: reminded to get eye exam, reminded to bring blood glucose meter & log to each visit, reminded to bring medications to each visit, discussed foot care and discussed diet Educational resources provided:   Self management tools provided:   Other plans:  - HbA1c - Continue Metformin 500 mg BID, will adjust based on A1c - Set up retina eye exam - Foot exam today - Patient has elevated LDL of 101. Goal is < 70 for DM. Patient's 10 year risk of a cardiovascular event is 5.2%. Will start Pravastatin 40 mg daily.

## 2014-09-11 LAB — HIV ANTIBODY (ROUTINE TESTING W REFLEX): HIV 1&2 Ab, 4th Generation: NONREACTIVE

## 2014-09-11 NOTE — Addendum Note (Signed)
Addended by: Oval Linsey D on: 09/11/2014 03:19 PM   Modules accepted: Level of Service

## 2014-09-11 NOTE — Progress Notes (Signed)
I saw and evaluated the patient. I personally confirmed the key portions of Dr. Shela Commons history and exam and reviewed pertinent patient test results. The assessment, diagnosis, and plan were formulated together and I agree with the documentation in the resident's note.

## 2014-09-12 ENCOUNTER — Telehealth: Payer: Self-pay | Admitting: Internal Medicine

## 2014-09-12 NOTE — Telephone Encounter (Signed)
Spoke with her mother. Will try to call later today or tomorrow to go over test results.

## 2014-09-13 NOTE — Telephone Encounter (Signed)
Pt came in, paged dr rivet, they spoke about lab results, pt is pleased

## 2014-09-16 ENCOUNTER — Encounter: Payer: Self-pay | Admitting: *Deleted

## 2014-10-02 NOTE — Addendum Note (Signed)
Addended by: Truddie Crumble on: 10/02/2014 02:07 PM   Modules accepted: Orders

## 2014-10-02 NOTE — Addendum Note (Signed)
Addended by: Truddie Crumble on: 10/02/2014 02:08 PM   Modules accepted: Orders

## 2014-10-23 ENCOUNTER — Other Ambulatory Visit: Payer: Self-pay | Admitting: *Deleted

## 2014-10-23 MED ORDER — METFORMIN HCL 500 MG PO TABS: 500.0000 mg | ORAL_TABLET | Freq: Two times a day (BID) | ORAL | Status: DC

## 2014-10-23 MED ORDER — LISINOPRIL-HYDROCHLOROTHIAZIDE 10-12.5 MG PO TABS: 1.0000 | ORAL_TABLET | Freq: Every day | ORAL | Status: DC

## 2014-10-24 ENCOUNTER — Telehealth: Payer: Self-pay | Admitting: *Deleted

## 2014-10-24 NOTE — Telephone Encounter (Addendum)
Call from pharmacy- they received an electronic refill on pt's metformin 500 mg tabs take two tabs daily.  Per pharmacy-pt has been getting the extended release since June 2015.  Will send info to pcp to confirm which one patient is supposed to be taking.  Please advise.[name], Sandy Musto Cassady12/10/20159:41 AM

## 2014-10-24 NOTE — Telephone Encounter (Signed)
Per my visit with her on 09/10/14 and previous clinic visit on 07/30/14 she was on Metformin 500 mg BID. I do not see any documentation noting that she was switched to extended release. Will keep on Metformin 500 mg BID and readdress at next visit.

## 2014-10-28 NOTE — Telephone Encounter (Signed)
Pharmacy aware.Despina Hidden Cassady12/14/201512:00 PM

## 2014-11-06 ENCOUNTER — Ambulatory Visit (HOSPITAL_COMMUNITY)
Admission: RE | Admit: 2014-11-06 | Discharge: 2014-11-06 | Disposition: A | Discharge status: Home / Self Care | Payer: Medicare Other | Source: Ambulatory Visit | Attending: Internal Medicine | Admitting: Internal Medicine

## 2014-11-06 DIAGNOSIS — E042 Nontoxic multinodular goiter: Secondary | ICD-10-CM | POA: Insufficient documentation

## 2014-11-06 DIAGNOSIS — E041 Nontoxic single thyroid nodule: Secondary | ICD-10-CM | POA: Diagnosis present

## 2014-11-11 ENCOUNTER — Telehealth: Payer: Self-pay | Admitting: Internal Medicine

## 2014-11-11 DIAGNOSIS — E041 Nontoxic single thyroid nodule: Secondary | ICD-10-CM

## 2014-11-11 NOTE — Telephone Encounter (Signed)
Spoke with patient and her mother about thyroid ultrasound results and need for biopsy of nodule on right side. They both understand. Will make referral to endocrinology for thyroid nodule biopsy.   Patient's mother also concerned about "chip" in patient's right breast that was supposed to be removed, but this was never done. Will look further into this issue and readdress at next visit.

## 2015-01-01 DIAGNOSIS — I4892 Unspecified atrial flutter: Secondary | ICD-10-CM | POA: Diagnosis not present

## 2015-01-01 DIAGNOSIS — R55 Syncope and collapse: Secondary | ICD-10-CM | POA: Diagnosis not present

## 2015-01-01 DIAGNOSIS — R42 Dizziness and giddiness: Secondary | ICD-10-CM | POA: Diagnosis not present

## 2015-01-01 DIAGNOSIS — G809 Cerebral palsy, unspecified: Secondary | ICD-10-CM | POA: Diagnosis not present

## 2015-01-01 DIAGNOSIS — R0789 Other chest pain: Secondary | ICD-10-CM | POA: Diagnosis not present

## 2015-01-23 ENCOUNTER — Other Ambulatory Visit: Payer: Self-pay | Admitting: Internal Medicine

## 2015-01-29 DIAGNOSIS — F319 Bipolar disorder, unspecified: Secondary | ICD-10-CM | POA: Diagnosis not present

## 2015-01-30 DIAGNOSIS — R42 Dizziness and giddiness: Secondary | ICD-10-CM | POA: Diagnosis not present

## 2015-02-25 DIAGNOSIS — I4892 Unspecified atrial flutter: Secondary | ICD-10-CM | POA: Diagnosis not present

## 2015-02-25 DIAGNOSIS — E780 Pure hypercholesterolemia: Secondary | ICD-10-CM | POA: Diagnosis not present

## 2015-02-25 DIAGNOSIS — I10 Essential (primary) hypertension: Secondary | ICD-10-CM | POA: Diagnosis not present

## 2015-02-25 DIAGNOSIS — R55 Syncope and collapse: Secondary | ICD-10-CM | POA: Diagnosis not present

## 2015-02-26 DIAGNOSIS — F319 Bipolar disorder, unspecified: Secondary | ICD-10-CM | POA: Diagnosis not present

## 2015-03-03 ENCOUNTER — Telehealth: Payer: Self-pay | Admitting: Internal Medicine

## 2015-03-03 NOTE — Telephone Encounter (Signed)
Call to patient to confirm appointment for 03/04/15 at 3:15 home is disconnected cell vm is not set up

## 2015-03-04 ENCOUNTER — Ambulatory Visit (INDEPENDENT_AMBULATORY_CARE_PROVIDER_SITE_OTHER): Payer: Medicare Other | Admitting: Internal Medicine

## 2015-03-04 ENCOUNTER — Encounter: Payer: Self-pay | Admitting: Internal Medicine

## 2015-03-04 ENCOUNTER — Observation Stay (HOSPITAL_COMMUNITY): Payer: Medicare Other

## 2015-03-04 ENCOUNTER — Other Ambulatory Visit (HOSPITAL_COMMUNITY)
Admission: RE | Admit: 2015-03-04 | Discharge: 2015-03-04 | Disposition: A | Discharge status: Home / Self Care | Payer: Medicare Other | Source: Ambulatory Visit | Attending: Internal Medicine | Admitting: Internal Medicine

## 2015-03-04 ENCOUNTER — Observation Stay (HOSPITAL_COMMUNITY)
Admission: AD | Admit: 2015-03-04 | Discharge: 2015-03-06 | Disposition: A | Discharge status: Home / Self Care | Payer: Medicare Other | Source: Ambulatory Visit | Attending: Oncology | Admitting: Oncology

## 2015-03-04 VITALS — BP 130/74 | HR 90 | Temp 98.0°F | Wt 192.2 lb

## 2015-03-04 DIAGNOSIS — F329 Major depressive disorder, single episode, unspecified: Secondary | ICD-10-CM | POA: Insufficient documentation

## 2015-03-04 DIAGNOSIS — G800 Spastic quadriplegic cerebral palsy: Secondary | ICD-10-CM | POA: Insufficient documentation

## 2015-03-04 DIAGNOSIS — D539 Nutritional anemia, unspecified: Secondary | ICD-10-CM

## 2015-03-04 DIAGNOSIS — Z79899 Other long term (current) drug therapy: Secondary | ICD-10-CM | POA: Diagnosis not present

## 2015-03-04 DIAGNOSIS — N920 Excessive and frequent menstruation with regular cycle: Secondary | ICD-10-CM | POA: Diagnosis not present

## 2015-03-04 DIAGNOSIS — Z8679 Personal history of other diseases of the circulatory system: Secondary | ICD-10-CM | POA: Diagnosis not present

## 2015-03-04 DIAGNOSIS — E042 Nontoxic multinodular goiter: Secondary | ICD-10-CM | POA: Diagnosis not present

## 2015-03-04 DIAGNOSIS — E119 Type 2 diabetes mellitus without complications: Secondary | ICD-10-CM

## 2015-03-04 DIAGNOSIS — R079 Chest pain, unspecified: Secondary | ICD-10-CM | POA: Insufficient documentation

## 2015-03-04 DIAGNOSIS — D252 Subserosal leiomyoma of uterus: Secondary | ICD-10-CM | POA: Diagnosis not present

## 2015-03-04 DIAGNOSIS — D259 Leiomyoma of uterus, unspecified: Principal | ICD-10-CM | POA: Diagnosis present

## 2015-03-04 DIAGNOSIS — E785 Hyperlipidemia, unspecified: Secondary | ICD-10-CM | POA: Diagnosis not present

## 2015-03-04 DIAGNOSIS — R010 Benign and innocent cardiac murmurs: Secondary | ICD-10-CM | POA: Diagnosis not present

## 2015-03-04 DIAGNOSIS — D509 Iron deficiency anemia, unspecified: Secondary | ICD-10-CM | POA: Diagnosis present

## 2015-03-04 DIAGNOSIS — R6883 Chills (without fever): Secondary | ICD-10-CM | POA: Insufficient documentation

## 2015-03-04 DIAGNOSIS — N760 Acute vaginitis: Secondary | ICD-10-CM | POA: Diagnosis not present

## 2015-03-04 DIAGNOSIS — D649 Anemia, unspecified: Secondary | ICD-10-CM | POA: Diagnosis present

## 2015-03-04 DIAGNOSIS — Z9189 Other specified personal risk factors, not elsewhere classified: Secondary | ICD-10-CM | POA: Diagnosis not present

## 2015-03-04 DIAGNOSIS — N838 Other noninflammatory disorders of ovary, fallopian tube and broad ligament: Secondary | ICD-10-CM | POA: Diagnosis not present

## 2015-03-04 DIAGNOSIS — Z0000 Encounter for general adult medical examination without abnormal findings: Secondary | ICD-10-CM

## 2015-03-04 DIAGNOSIS — R0600 Dyspnea, unspecified: Secondary | ICD-10-CM | POA: Insufficient documentation

## 2015-03-04 DIAGNOSIS — I10 Essential (primary) hypertension: Secondary | ICD-10-CM | POA: Insufficient documentation

## 2015-03-04 DIAGNOSIS — F419 Anxiety disorder, unspecified: Secondary | ICD-10-CM | POA: Insufficient documentation

## 2015-03-04 DIAGNOSIS — D251 Intramural leiomyoma of uterus: Secondary | ICD-10-CM | POA: Diagnosis not present

## 2015-03-04 DIAGNOSIS — D219 Benign neoplasm of connective and other soft tissue, unspecified: Secondary | ICD-10-CM | POA: Insufficient documentation

## 2015-03-04 DIAGNOSIS — I4892 Unspecified atrial flutter: Secondary | ICD-10-CM | POA: Insufficient documentation

## 2015-03-04 DIAGNOSIS — Z1151 Encounter for screening for human papillomavirus (HPV): Secondary | ICD-10-CM | POA: Insufficient documentation

## 2015-03-04 DIAGNOSIS — R011 Cardiac murmur, unspecified: Secondary | ICD-10-CM | POA: Diagnosis not present

## 2015-03-04 DIAGNOSIS — F32A Depression, unspecified: Secondary | ICD-10-CM | POA: Diagnosis present

## 2015-03-04 DIAGNOSIS — Z7901 Long term (current) use of anticoagulants: Secondary | ICD-10-CM | POA: Diagnosis not present

## 2015-03-04 DIAGNOSIS — Z124 Encounter for screening for malignant neoplasm of cervix: Secondary | ICD-10-CM | POA: Insufficient documentation

## 2015-03-04 DIAGNOSIS — Z3202 Encounter for pregnancy test, result negative: Secondary | ICD-10-CM | POA: Diagnosis not present

## 2015-03-04 DIAGNOSIS — Z87898 Personal history of other specified conditions: Secondary | ICD-10-CM

## 2015-03-04 DIAGNOSIS — F259 Schizoaffective disorder, unspecified: Secondary | ICD-10-CM | POA: Diagnosis not present

## 2015-03-04 DIAGNOSIS — E041 Nontoxic single thyroid nodule: Secondary | ICD-10-CM | POA: Diagnosis not present

## 2015-03-04 DIAGNOSIS — D500 Iron deficiency anemia secondary to blood loss (chronic): Secondary | ICD-10-CM | POA: Diagnosis present

## 2015-03-04 DIAGNOSIS — Z113 Encounter for screening for infections with a predominantly sexual mode of transmission: Secondary | ICD-10-CM | POA: Diagnosis not present

## 2015-03-04 HISTORY — DX: Anemia, unspecified: D64.9

## 2015-03-04 LAB — FERRITIN: FERRITIN: 4 ng/mL — AB (ref 10–291)

## 2015-03-04 LAB — CBC WITH DIFFERENTIAL/PLATELET
Basophils Absolute: 0 10*3/uL (ref 0.0–0.1)
Basophils Relative: 0 % (ref 0–1)
EOS PCT: 2 % (ref 0–5)
Eosinophils Absolute: 0.1 10*3/uL (ref 0.0–0.7)
HCT: 18.2 % — ABNORMAL LOW (ref 36.0–46.0)
Hemoglobin: 5.1 g/dL — CL (ref 12.0–15.0)
LYMPHS ABS: 1.8 10*3/uL (ref 0.7–4.0)
Lymphocytes Relative: 37 % (ref 12–46)
MCH: 16.9 pg — AB (ref 26.0–34.0)
MCHC: 28 g/dL — ABNORMAL LOW (ref 30.0–36.0)
MCV: 60.3 fL — AB (ref 78.0–100.0)
MONO ABS: 0.5 10*3/uL (ref 0.1–1.0)
Monocytes Relative: 10 % (ref 3–12)
NEUTROS ABS: 2.4 10*3/uL (ref 1.7–7.7)
Neutrophils Relative %: 51 % (ref 43–77)
PLATELETS: 164 10*3/uL (ref 150–400)
RBC: 3.02 MIL/uL — ABNORMAL LOW (ref 3.87–5.11)
RDW: 20.1 % — ABNORMAL HIGH (ref 11.5–15.5)
WBC: 4.8 10*3/uL (ref 4.0–10.5)

## 2015-03-04 LAB — SAVE SMEAR

## 2015-03-04 LAB — GLUCOSE, CAPILLARY
GLUCOSE-CAPILLARY: 94 mg/dL (ref 70–99)
GLUCOSE-CAPILLARY: 89 mg/dL (ref 70–99)
Glucose-Capillary: 136 mg/dL — ABNORMAL HIGH (ref 70–99)

## 2015-03-04 LAB — IRON AND TIBC
Iron: <10 ug/dL — ABNORMAL LOW (ref 42–145)
UIBC: 431 ug/dL — ABNORMAL HIGH (ref 125–400)

## 2015-03-04 LAB — VITAMIN B12: Vitamin B-12: 211 pg/mL (ref 211–911)

## 2015-03-04 LAB — MRSA PCR SCREENING: MRSA by PCR: NEGATIVE

## 2015-03-04 MED ORDER — VENLAFAXINE HCL ER 150 MG PO CP24
150.0000 mg | ORAL_CAPSULE | Freq: Every day | ORAL | Status: DC
  Administered 2015-03-04 – 2015-03-05 (×2): 150 mg via ORAL
  Filled 2015-03-04 (×3): qty 1

## 2015-03-04 MED ORDER — INSULIN ASPART 100 UNIT/ML ~~LOC~~ SOLN: 0.0000 [IU] | Freq: Three times a day (TID) | SUBCUTANEOUS | Status: DC

## 2015-03-04 MED ORDER — VENLAFAXINE HCL ER 75 MG PO CP24
75.0000 mg | ORAL_CAPSULE | Freq: Every day | ORAL | Status: DC
  Administered 2015-03-05 – 2015-03-06 (×2): 75 mg via ORAL
  Filled 2015-03-04 (×3): qty 1

## 2015-03-04 MED ORDER — VITAMIN B-12 1000 MCG PO TABS
1000.0000 ug | ORAL_TABLET | Freq: Every day | ORAL | Status: DC
  Administered 2015-03-05 – 2015-03-06 (×2): 1000 ug via ORAL
  Filled 2015-03-04 (×2): qty 1

## 2015-03-04 MED ORDER — SODIUM CHLORIDE 0.9 % IJ SOLN
3.0000 mL | Freq: Two times a day (BID) | INTRAMUSCULAR | Status: DC
  Administered 2015-03-04 – 2015-03-06 (×4): 3 mL via INTRAVENOUS

## 2015-03-04 MED ORDER — LORATADINE 10 MG PO TABS
10.0000 mg | ORAL_TABLET | Freq: Every day | ORAL | Status: DC
  Administered 2015-03-04 – 2015-03-06 (×3): 10 mg via ORAL
  Filled 2015-03-04 (×3): qty 1

## 2015-03-04 MED ORDER — PRAVASTATIN SODIUM 40 MG PO TABS
40.0000 mg | ORAL_TABLET | Freq: Every day | ORAL | Status: DC
  Administered 2015-03-04 – 2015-03-06 (×3): 40 mg via ORAL
  Filled 2015-03-04 (×3): qty 1

## 2015-03-04 MED ORDER — SODIUM CHLORIDE 0.9 % IV SOLN
510.0000 mg | Freq: Once | INTRAVENOUS | Status: AC
  Administered 2015-03-04: 510 mg via INTRAVENOUS
  Filled 2015-03-04: qty 17

## 2015-03-04 MED ORDER — VENLAFAXINE HCL ER 75 MG PO CP24: 75.0000 mg | ORAL_CAPSULE | Freq: Two times a day (BID) | ORAL | Status: DC

## 2015-03-04 NOTE — Assessment & Plan Note (Addendum)
BP Readings from Last 3 Encounters:  03/04/15 114/58  03/04/15 130/74  09/10/14 131/79    Lab Results  Component Value Date   NA 135 07/30/2014   K 3.5 07/30/2014   CREATININE 0.67 07/30/2014    Assessment: Blood pressure control: controlled Progress toward BP goal:  at goal Comments: BP well controlled.   Plan: Medications:  continue current medications Other plans:  - Lisinopril-HCTZ discontinued by Dr. Woody Seller (cardiology) on 02/25/15 due to hypotension - Metoprolol succinate ER 25 mg daily was started, will continue this

## 2015-03-04 NOTE — Assessment & Plan Note (Signed)
Lab Results  Component Value Date   HGBA1C 6.9 09/10/2014   HGBA1C 7.2* 11/07/2012   HGBA1C 6.3* 11/21/2009     Assessment: Diabetes control: good control (HgbA1C at goal) Progress toward A1C goal:  at goal Comments: Unable to check HbA1c today due to anemia.   Plan: Medications:  continue current medications Instruction/counseling given: reminded to bring blood glucose meter & log to each visit and reminded to bring medications to each visit Other plans:  - Check HbA1c at next visit - Continue Metformin 500 mg BID

## 2015-03-04 NOTE — Assessment & Plan Note (Signed)
Patient saw Dr. Woody Seller on 02/25/15. She had cardiac event monitoring from 01/01/15-01/30/15 which did not reveal any arrhythmias. She only had a few periods of sinus tachycardia. She only had 1 episode of syncope and has been asymptomatic since that event. She does have a remote history of atrial flutter and was recommended to start low dose Metoprolol succinate 25 mg daily. Amiodarone and Xarelto were discontinued at this visit.  - Continue Metoprolol succinate 25 mg daily

## 2015-03-04 NOTE — Assessment & Plan Note (Signed)
Patient saw Dr. Woody Seller with cardiology on 02/25/15. She is noted to have tricuspid regurgitation in the past, but no murmurs were noted on exam. No further follow up recommended for her murmur.

## 2015-03-04 NOTE — H&P (Signed)
Date: 03/04/2015               Patient Name:  [name] MRN: [id number]  DOB: [birth year] Age / Sex: 44 y.o., female   PCP: Juliet Rude, MD         Medical Service: Internal Medicine Teaching Service         Attending Physician: Dr. Annia Belt, MD    First Contact: Dr. Randell Patient Pager: [phone]  Second Contact: Dr. Naaman Plummer Pager: [phone]       After Hours (After 5p/  First Contact Pager: [phone]  weekends / holidays): Second Contact Pager: [phone]   Chief Complaint: Symptomatic anemia  History of Present Illness: [name] is a 44 year old woman with history of HTN, T2DM, HLD, thyroid nodules, endometriosis admitted from clinic with symptomatic anemia. She reports she has been having dyspnea on exertion for the past week. She has also been fatigued. Denies previous episodes. She presented to clinic today and was found to have hgb of 5.1. She reports intermittent chest pain that is sharp and midsternal. She attributes it to anxiety. She denies syncope, lightheadedness, palpitations, melena, hematochezia, hematuria. She has been having heavy mentrual periods for the past few months. Her LMP ended 02/16/2015 and lasted 2 weeks. She had heavy bleeding the first week, having to change pads every 5 minutes for a few days. She was recently on xarelto for paroxysmal atrial flutter and this was stopped by her cardiologist on 02/25/2015.   Meds: Prescriptions prior to admission  Medication Sig Dispense Refill Last Dose  . amiodarone (PACERONE) 200 MG tablet Take 200 mg by mouth daily.   Not Taking  . cetirizine (ZYRTEC) 10 MG tablet Take 30 mg by mouth daily.   Taking  . lisinopril-hydrochlorothiazide (PRINZIDE,ZESTORETIC) 10-12.5 MG per tablet Take 1 tablet by mouth daily. (Patient not taking: Reported on 03/04/2015) 30 tablet 5 Not Taking  . metFORMIN (GLUCOPHAGE) 500 MG tablet Take 1 tablet (500 mg total) by mouth 2 (two) times daily with a meal. 60 tablet 5 Taking  .  metFORMIN (GLUCOPHAGE-XR) 500 MG 24 hr tablet Take 500 mg by mouth 2 (two) times daily.     . metoprolol succinate (TOPROL-XL) 25 MG 24 hr tablet Take 25 mg by mouth daily.   Taking  . omeprazole (PRILOSEC) 20 MG capsule Take 20 mg by mouth daily.   Taking  . pravastatin (PRAVACHOL) 40 MG tablet TAKE ONE (1) TABLET BY MOUTH EVERY DAY 30 tablet 5 Taking  . QUEtiapine (SEROQUEL) 50 MG tablet Take 50 mg by mouth daily.     [name] venlafaxine XR (EFFEXOR-XR) 75 MG 24 hr capsule Take 75-150 mg by mouth 2 (two) times daily. TAKE ONE CAPSULE BY MOUTH IN THE MORNING AND TWO AT BEDTIME per patient   Taking  . XARELTO 20 MG TABS tablet Take 20 mg by mouth daily.   Not Taking    Allergies: Allergies as of 03/04/2015 - Review Complete 03/04/2015  Allergen Reaction Noted  . Ivp dye [iodinated diagnostic agents]  11/07/2012   Past Medical History  Diagnosis Date  . Diabetes mellitus due to abnormal insulin   . Hypertension   . Congenital cerebral palsy   . Hearing impairment   . Schizoaffective disorder   . Depression    Past Surgical History  Procedure Laterality Date  . Cesarean section    . Exploratory laparotomy with abdominal mass excision     Family History  Problem Relation Age of Onset  .  Heart failure Mother   . Hypertension Mother   . Diabetes Mellitus II Mother   . Cancer Mother   . Diabetes Mellitus II Father   . Hypertension Father    History   Social History  . Marital Status: Married    Spouse Name: N/A  . Number of Children: N/A  . Years of Education: N/A   Occupational History  . Not on file.   Social History Main Topics  . Smoking status: Never Smoker   . Smokeless tobacco: Not on file  . Alcohol Use: No  . Drug Use: No  . Sexual Activity: Not Currently   Other Topics Concern  . Not on file   Social History Narrative  Jehovah's witness  Review of Systems: Constitutional: no fevers, +chills Eyes: no vision changes Ears, nose, mouth, throat, and face: no  cough Respiratory: no shortness of breath Cardiovascular: +intermittent chest pain Gastrointestinal: no nausea/vomiting, +intermittent abdominal pain, no constipation, no diarrhea Genitourinary: no dysuria, no hematuria Integument: no rash Hematologic/lymphatic: no bleeding/bruising, no edema Musculoskeletal: no arthralgias, no myalgias Neurological: no paresthesias, no weakness   Physical Exam: Blood pressure 114/58, pulse 70, temperature 98.2 F (36.8 C), temperature source Oral, resp. rate 18, SpO2 100 %. General Apperance: NAD Head: Normocephalic, atraumatic Eyes: PERRL, EOMI, anicteric sclera Ears: Normal external ear canal Nose: Nares normal, septum midline, mucosa normal Throat: Lips, mucosa and tongue normal  Neck: Supple, trachea midline Back: No tenderness or bony abnormality  Lungs: Clear to auscultation bilaterally. No wheezes, rhonchi or rales. Breathing comfortably Chest Wall: Nontender, no deformity Heart: Regular rate and rhythm, no murmur/rub/gallop Abdomen: Soft, nontender, nondistended, no rebound/guarding Extremities: Normal, atraumatic, warm and well perfused, no edema Pulses: 2+ throughout Skin: No rashes or lesions Neurologic: Alert and oriented x 3. CNII-XII intact. Normal strength and sensation  Lab results: CBC:  Recent Labs  03/04/15 1434  WBC 4.8  NEUTROABS 2.4  HGB 5.1*  HCT 18.2*  MCV 60.3*  PLT 164   CBG:  Recent Labs  03/04/15 1404 03/04/15 1618  GLUCAP 94 89   Coagulation: No results for input(s): LABPROT, INR in the last 72 hours.   FOBT 03/04/2015: Negative  Imaging results:  No results found.  Other results: EKG: none  Assessment & Plan by Problem: Active Problems:   Symptomatic anemia  Symtomatic anemia: Hgb 5.1 on admission. Likely 2/2 abnormal uterine bleeding. She reports she does not presently have bleeding. She is a Sales promotion account executive witness. She has agreed to erythropoiesis-stimulating agents and intravenous  iron. -12 lead EKG -PT/INR, PTT -IV ferumoxytol 510 x 1 -Aranesp -Repeat CBC in AM  Abnormal uterine bleeding: US pelvis 11/26/2011 with multifibroid uterus. -US pelvis and transvaginal -UPT -Will need outpatient gynecology follow up -Consider estrogen-progestin contraceptives  HTN: lisinopril-HCTZ 10/12.5mg  daily discontinued by cardiologist -home medication held  T2DM: on metformin 500mg  BID at home -CBG QAC/HS  HLD: continue home pravachol 40mg  QHS  FEN: carb modified  VTE ppx: SCDs  Dispo: Disposition is deferred at this time, awaiting improvement of current medical problems. Anticipated discharge in approximately 1-2 day(s).   The patient does have a current PCP (Carly Montey Hora, MD) and does need an [facility] hospital follow-up appointment after discharge.  The patient does not have transportation limitations that hinder transportation to clinic appointments.  Signed: Milagros Loll, MD 03/04/2015, 5:45 PM

## 2015-03-04 NOTE — Assessment & Plan Note (Addendum)
Pap smear, GC/Chlamydia performed today - f/u cytology and results

## 2015-03-04 NOTE — Assessment & Plan Note (Signed)
Patient presented for routine follow up visit found to have a Hbg of 5.1. She noted dyspnea and tiredness for the last 1 week. Denies chest pain, syncope, dizziness, melena, hematochezia, hematuria. Patient reported having a recent heavy menstrual period that lasted 2 weeks (ended on April 3rd) and having to change her pad frequently for 1 week. Pelvic ultrasound from Jan 2013 shows a multifibroid uterus which is likely the source of her bleeding. No active bleeding was noted on exam. FOBT negative.  - Admit to inpatient service - Patient is a Sales promotion account executive witness and does not wish to receive any blood products - She states she can receive EPO and IV Iron - Anemia labs drawn- Fe, TIBC, ferritin, vitamin B12, folate, retic count, peripheral smear - Monitor Hbg Q4H  - Start IVFs

## 2015-03-04 NOTE — Progress Notes (Signed)
New Admission Note:  Mental Orientation: Alert and Oriented  Telemetry: No orders  Assessment: Completed Pain: No complaints of pain  Safety Measures: Safety Fall Prevention Plan has been given, discussed and signed Admission: Completed [address] Orientation: Patient has been orientated to the room, unit and staff.  Family: None at beside, pt requests to be XXX  Orders have been reviewed and implemented. Will continue to monitor the patient. Call light has been placed within reach and bed alarm has been activated.   Alcide Evener BSN, RN Phone number: [phone]

## 2015-03-04 NOTE — Assessment & Plan Note (Signed)
Patient scheduled to have fine needle aspiration of her thyroid nodule on April 30th with Endo. Will f/u results.

## 2015-03-04 NOTE — Progress Notes (Signed)
   Subjective:    Patient ID: [name], female    DOB: 1970-12-15, 44 y.o.   MRN: [id number]  HPI [name] is a 44yo woman with PMHx of HTN, Type 2 DM, HLD, thyroid nodules, and depression who presents today for a follow up visit:  Symptomatic Anemia: While checking for a routine HbA1c today a lab error reported that her Hbg was too low. A CBC was done which revealed a Hbg of 5.1. Patient reports dyspnea for the last 1 week, especially upon exertion. She also notes feeling tired for past week as well. She denies chest pain, syncope, palpitations, melena, hematochezia, hematuria, and dizziness. She notes she has been having heavy periods for the past few months. Her last period, which ended on April 3rd, lasted 2 weeks. Her bleeding was so heavy the first week that she had to change pads every 5 minutes for the first few days. She states her periods used to only last 3-4 days. She does not have a OBGYN that she follows with. She is noted to have a history of endometriosis but has not received follow up for this.   HTN: BP 120/66 today. She takes Metoprolol succinate ER 25 mg daily. Her Lisinopril-HCTZ 10-12.5 mg daily was discontinued by Dr. Woody Seller on 02/25/15.   Type 2 DM: Last HbA1c 6.9 on 09/10/14. A repeat HbA1c was attempted today but her Hbg level was too low for a reading (see above). She takes Metformin 500 mg BID at home. She denies polyuria, polydipsia, blurry vision, abdominal pain, nausea, and vomiting.   Thyroid Nodules: Patient noted to have thyroid nodules on exam back in Sept 2015. These were first noted on CT in 2011 but never followed up. She had a soft tissue head/neck ultrasound on 11/06/14 which showed bilateral nodules with the dominant right lower pole nodule measuring 2.3 cm which meets criteria for biopsy. Patient was referred to Endocrinology and has an appointment on April 30th to have a fine needle aspiration biopsy.   Review of Systems General: Denies fever, chills,  night sweats, changes in weight, changes in appetite HEENT: Denies headaches, ear pain, changes in vision, rhinorrhea, sore throat CV: Denies orthopnea Pulm: Denies cough, wheezing GI: Denies abdominal pain, nausea, vomiting, diarrhea, constipation GU: Denies dysuria, frequency Msk: Denies muscle cramps, joint pains Neuro: Denies weakness, numbness, tingling Skin: Denies rashes, bruising    Objective:   Physical Exam  Orthostatics:  Laying- 120/64, HR 78 Sitting- 120/66, HR 86 Standing- 121/67, HR 94  General: sitting up in chair, NAD HEENT: [place]/AT, EOMI, mucus membranes moist CV: RRR, no m/g/r Pulm: CTA bilaterally, breaths non-labored Abd: BS+, soft, non-tender, non-distended. FOBT negative.  Pelvic: external genitalia appear normal. Cervix appears normal. Minimal amount of white discharge noted in vaginal vault. No active bleeding.  Ext: warm, no edema, moves all Neuro: alert and oriented x 3, no focal deficits     Assessment & Plan:  Please refer to individual A&P documentation.

## 2015-03-05 ENCOUNTER — Encounter (HOSPITAL_COMMUNITY): Payer: Self-pay | Admitting: General Practice

## 2015-03-05 DIAGNOSIS — I10 Essential (primary) hypertension: Secondary | ICD-10-CM | POA: Diagnosis not present

## 2015-03-05 DIAGNOSIS — I4892 Unspecified atrial flutter: Secondary | ICD-10-CM | POA: Diagnosis not present

## 2015-03-05 DIAGNOSIS — N920 Excessive and frequent menstruation with regular cycle: Secondary | ICD-10-CM | POA: Diagnosis not present

## 2015-03-05 DIAGNOSIS — E119 Type 2 diabetes mellitus without complications: Secondary | ICD-10-CM | POA: Diagnosis not present

## 2015-03-05 DIAGNOSIS — D62 Acute posthemorrhagic anemia: Secondary | ICD-10-CM

## 2015-03-05 DIAGNOSIS — E785 Hyperlipidemia, unspecified: Secondary | ICD-10-CM

## 2015-03-05 DIAGNOSIS — D259 Leiomyoma of uterus, unspecified: Principal | ICD-10-CM

## 2015-03-05 LAB — COMPREHENSIVE METABOLIC PANEL
ALT: 10 U/L (ref 0–35)
ANION GAP: 9 (ref 5–15)
AST: 13 U/L (ref 0–37)
Albumin: 3.1 g/dL — ABNORMAL LOW (ref 3.5–5.2)
Alkaline Phosphatase: 52 U/L (ref 39–117)
CO2: 25 mmol/L (ref 19–32)
CREATININE: 0.8 mg/dL (ref 0.50–1.10)
Calcium: 8.7 mg/dL (ref 8.4–10.5)
Chloride: 105 mmol/L (ref 96–112)
GFR calc Af Amer: >90 mL/min (ref >90)
GFR calc non Af Amer: >90 mL/min (ref >90)
Glucose, Bld: 102 mg/dL — ABNORMAL HIGH (ref 70–99)
POTASSIUM: 3.4 mmol/L — AB (ref 3.5–5.1)
Sodium: 139 mmol/L (ref 135–145)
TOTAL PROTEIN: 6 g/dL (ref 6.0–8.3)
Total Bilirubin: 0.4 mg/dL (ref 0.3–1.2)

## 2015-03-05 LAB — APTT: aPTT: 28 seconds (ref 24–37)

## 2015-03-05 LAB — GLUCOSE, CAPILLARY
GLUCOSE-CAPILLARY: 98 mg/dL (ref 70–99)
GLUCOSE-CAPILLARY: 89 mg/dL (ref 70–99)
Glucose-Capillary: 100 mg/dL — ABNORMAL HIGH (ref 70–99)
Glucose-Capillary: 104 mg/dL — ABNORMAL HIGH (ref 70–99)
Glucose-Capillary: 99 mg/dL (ref 70–99)

## 2015-03-05 LAB — PROTIME-INR
INR: 1.16 (ref 0.00–1.49)
Prothrombin Time: 14.9 seconds (ref 11.6–15.2)

## 2015-03-05 LAB — CBC
HCT: 17.8 % — ABNORMAL LOW (ref 36.0–46.0)
Hemoglobin: 5 g/dL — CL (ref 12.0–15.0)
MCH: 16.9 pg — AB (ref 26.0–34.0)
MCHC: 28.1 g/dL — AB (ref 30.0–36.0)
MCV: 60.3 fL — AB (ref 78.0–100.0)
PLATELETS: 161 10*3/uL (ref 150–400)
RBC: 2.95 MIL/uL — ABNORMAL LOW (ref 3.87–5.11)
RDW: 20.2 % — AB (ref 11.5–15.5)
WBC: 4.5 10*3/uL (ref 4.0–10.5)

## 2015-03-05 LAB — CERVICOVAGINAL ANCILLARY ONLY
CHLAMYDIA, DNA PROBE: NEGATIVE
NEISSERIA GONORRHEA: NEGATIVE
WET PREP (BD AFFIRM): NEGATIVE

## 2015-03-05 LAB — FOLATE RBC
FOLATE, RBC: 1655 ng/mL (ref >498)
Folate, Hemolysate: 319.4 ng/mL
HEMATOCRIT: 19.3 % — AB (ref 34.0–46.6)

## 2015-03-05 LAB — CYTOLOGY - PAP

## 2015-03-05 LAB — RETICULOCYTES
ABS Retic: 36.4 10*3/uL (ref 19.0–186.0)
RBC.: 3.03 MIL/uL — AB (ref 3.87–5.11)
Retic Ct Pct: 1.2 % (ref 0.4–2.3)

## 2015-03-05 LAB — PREGNANCY, URINE: Preg Test, Ur: NEGATIVE

## 2015-03-05 LAB — HEMOGLOBIN A1C
Hgb A1c MFr Bld: 6.1 % — ABNORMAL HIGH (ref <5.7)
Mean Plasma Glucose: 128 mg/dL — ABNORMAL HIGH (ref <117)

## 2015-03-05 LAB — PREPARE RBC (CROSSMATCH)

## 2015-03-05 LAB — ABO/RH: ABO/RH(D): O NEG

## 2015-03-05 MED ORDER — SENNOSIDES-DOCUSATE SODIUM 8.6-50 MG PO TABS: 1.0000 | ORAL_TABLET | Freq: Every evening | ORAL | Status: DC | PRN

## 2015-03-05 MED ORDER — DARBEPOETIN ALFA 40 MCG/0.4ML IJ SOSY
40.0000 ug | PREFILLED_SYRINGE | INTRAMUSCULAR | Status: DC
Start: 2015-03-05 — End: 2015-03-05
  Filled 2015-03-05: qty 0.4

## 2015-03-05 MED ORDER — FERROUS SULFATE 325 (65 FE) MG PO TABS
325.0000 mg | ORAL_TABLET | Freq: Three times a day (TID) | ORAL | Status: DC
  Administered 2015-03-05 – 2015-03-06 (×4): 325 mg via ORAL
  Filled 2015-03-05 (×7): qty 1

## 2015-03-05 MED ORDER — SODIUM CHLORIDE 0.9 % IV SOLN
Freq: Once | INTRAVENOUS | Status: AC
  Administered 2015-03-05: 14:00:00 via INTRAVENOUS

## 2015-03-05 MED ORDER — ACETAMINOPHEN 325 MG PO TABS
650.0000 mg | ORAL_TABLET | Freq: Four times a day (QID) | ORAL | Status: DC | PRN
  Administered 2015-03-05: 650 mg via ORAL
  Filled 2015-03-05: qty 2

## 2015-03-05 MED ORDER — ESTRADIOL VALERATE-DIENOGEST 3/2-2/2-3/1 MG PO TABS: 1.0000 | ORAL_TABLET | Freq: Every day | ORAL | Status: DC

## 2015-03-05 NOTE — Progress Notes (Signed)
UR completed 

## 2015-03-05 NOTE — Progress Notes (Addendum)
Subjective: No acute events overnight. She reports she feels dizzy today. She has some nausea but tolerated PO intake. She still feels fatigued. Denies CP. She is agreeable to blood transfusion. She declined counsultation with anyone else before making this decision.  Objective: Vital signs in last 24 hours: Filed Vitals:   03/04/15 1708 03/04/15 2027 03/05/15 0427 03/05/15 0958  BP: 114/58 118/65 124/74 108/63  Pulse: 70 82 80 77  Temp: 98.2 F (36.8 C) 98.4 F (36.9 C) 97.4 F (36.3 C) 98.2 F (36.8 C)  TempSrc: Oral Oral Oral Oral  Resp: 18 20 18 18   Weight:  199 lb 11.8 oz (90.6 kg)    SpO2: 100% 100% 100% 100%   Weight change:   Intake/Output Summary (Last 24 hours) at 03/05/15 1302 Last data filed at 03/05/15 0913  Gross per 24 hour  Intake    240 ml  Output    200 ml  Net     40 ml   General Apperance: NAD HEENT: Normocephalic, atraumatic, PERRL, EOMI, anicteric sclera Neck: Supple, trachea midline Lungs: Clear to auscultation bilaterally. No wheezes, rhonchi or rales. Breathing comfortably Heart: Regular rate and rhythm, no murmur/rub/gallop Abdomen: Soft, nontender, nondistended, no rebound/guarding Extremities: Normal, atraumatic, warm and well perfused, no edema Pulses: 2+ throughout Skin: No rashes or lesions Neurologic: Alert and oriented x 3. CNII-XII intact. Normal strength and sensation  Lab Results: Basic Metabolic Panel:  Recent Labs Lab 03/05/15 0502  NA 139  K 3.4*  CL 105  CO2 25  GLUCOSE 102*  BUN <5*  CREATININE 0.80  CALCIUM 8.7   Liver Function Tests:  Recent Labs Lab 03/05/15 0502  AST 13  ALT 10  ALKPHOS 52  BILITOT 0.4  PROT 6.0  ALBUMIN 3.1*   CBC:  Recent Labs Lab 03/04/15 1434 03/05/15 0502  WBC 4.8 4.5  NEUTROABS 2.4  --   HGB 5.1* 5.0*  HCT 18.2* 17.8*  MCV 60.3* 60.3*  PLT 164 161   CBG:  Recent Labs Lab 03/04/15 1404 03/04/15 1618 03/04/15 2230 03/05/15 0745 03/05/15 1003 03/05/15 1154    GLUCAP 94 89 136* 100* 104* 98    Recent Labs Lab 03/05/15 0502  LABPROT 14.9  INR 1.16   Anemia Panel:  Recent Labs Lab 03/04/15 1436  VITAMINB12 211  FERRITIN 4*  TIBC SEE NOTE  IRON <10*  RETICCTPCT 1.2   Misc. Labs: Urine pregnancy test 03/04/2015: negative  Studies/Results: US Transvaginal Non-ob  03/04/2015   CLINICAL DATA:  Fibroids.  EXAM: TRANSABDOMINAL AND TRANSVAGINAL ULTRASOUND OF PELVIS  TECHNIQUE: Both transabdominal and transvaginal ultrasound examinations of the pelvis were performed. Transabdominal technique was performed for global imaging of the pelvis including uterus, ovaries, adnexal regions, and pelvic cul-de-sac. It was necessary to proceed with endovaginal exam following the transabdominal exam to visualize the endometrium.  COMPARISON:  11/26/2011  FINDINGS: Uterus  Measurements: 17 x 10 x 13 cm. There are numerous myometrial masses with heterogeneous, predominant hypoechoic appearance. The largest and measured are located in the sub serosal anterior body at 44 mm, intramural fundus measuring up to 52 mm, sub serosal posterior uterine body at the 53 mm, and posterior lower uterine segment at up to 66 mm.  Endometrium  Thickness: Where visualized 15 mm. Note that the majority the endometrium is obscured by the fibroid shadowing and distortion  Right ovary  Measurements: 5.7 x 4.6 x 4.9 cm. 2 cm peripherally echogenic cystic structure in the right ovary is compatible with a corpus  luteum.  Left ovary  Measurements: 4.1 x 2.7 x 2.9 cm. Normal appearance/no adnexal mass.  Other findings  No free fluid.  IMPRESSION: 1. No acute findings. 2. Multifibroid uterus with extensive distortion and obscuration of the endometrium.   Electronically Signed   By: Monte Fantasia M.D.   On: 03/04/2015 22:26   US Pelvis Complete  03/04/2015   CLINICAL DATA:  Fibroids.  EXAM: TRANSABDOMINAL AND TRANSVAGINAL ULTRASOUND OF PELVIS  TECHNIQUE: Both transabdominal and transvaginal  ultrasound examinations of the pelvis were performed. Transabdominal technique was performed for global imaging of the pelvis including uterus, ovaries, adnexal regions, and pelvic cul-de-sac. It was necessary to proceed with endovaginal exam following the transabdominal exam to visualize the endometrium.  COMPARISON:  11/26/2011  FINDINGS: Uterus  Measurements: 17 x 10 x 13 cm. There are numerous myometrial masses with heterogeneous, predominant hypoechoic appearance. The largest and measured are located in the sub serosal anterior body at 44 mm, intramural fundus measuring up to 52 mm, sub serosal posterior uterine body at the 53 mm, and posterior lower uterine segment at up to 66 mm.  Endometrium  Thickness: Where visualized 15 mm. Note that the majority the endometrium is obscured by the fibroid shadowing and distortion  Right ovary  Measurements: 5.7 x 4.6 x 4.9 cm. 2 cm peripherally echogenic cystic structure in the right ovary is compatible with a corpus luteum.  Left ovary  Measurements: 4.1 x 2.7 x 2.9 cm. Normal appearance/no adnexal mass.  Other findings  No free fluid.  IMPRESSION: 1. No acute findings. 2. Multifibroid uterus with extensive distortion and obscuration of the endometrium.   Electronically Signed   By: Monte Fantasia M.D.   On: 03/04/2015 22:26   Medications: I have reviewed the patient's current medications. Scheduled Meds: . sodium chloride   Intravenous Once  . darbepoetin (ARANESP) injection - NON-DIALYSIS  40 mcg Subcutaneous Q Wed-1800  . ferrous sulfate  325 mg Oral TID WC  . loratadine  10 mg Oral Daily  . pravastatin  40 mg Oral Daily  . sodium chloride  3 mL Intravenous Q12H  . venlafaxine XR  150 mg Oral QHS  . venlafaxine XR  75 mg Oral Q breakfast  . vitamin B-12  1,000 mcg Oral Daily   Continuous Infusions:  PRN Meds:.senna-docusate Assessment/Plan: Principal Problem:   Symptomatic anemia Active Problems:   Essential hypertension   Type 2 diabetes  mellitus   Depression, controlled   Hyperlipidemia LDL goal <70   Iron deficiency anemia due to chronic blood loss   Uterine fibroid  Symtomatic anemia: Hgb 5.1 on admission. Likely 2/2 abnormal uterine bleeding. No continued bleeding. Hgb stable this morning at 5. She continues to be symptomatic. She is a Sales promotion account executive witness. She is agreeable to blood transfusion. She declined counsultation with anyone else before making this decision. EKG was normal sinus rhythm without any acute ischemic changes. Coags wnl. Vitamin B12 wnl. Iron <10 and ferritin 4 consistent with iron deficiency. S/p IV Fereheme 510 x 1. -Folate pending -Type and screen -Transfuse 2u pRBC -Recheck CBC post transfusion -ferrous sulfate 325mg  TID -Senokot-S 1 tab daily prn constipation  Abnormal uterine bleeding: US pelvis 11/26/2011 with multifibroid uterus. Repeat US with multifibroid uterus with extensive distortion and obscuration of the endometrium. Negative UPT. -Will need outpatient gynecology follow up -Will start estrogen-progestin contraceptive on discharge  HTN: lisinopril-HCTZ 10/12.5mg  daily discontinued by cardiologist. Presently normotensive. -home medication held -may consider restarting home Metoprolol succinate 25 mg daily if  she becomes hypertensive  T2DM: on metformin 500mg  BID at home -Metformin held -CBG QAC/HS  HLD: continue home pravachol 40mg  QHS  FEN: carb modified  VTE ppx: SCDs  Dispo: Disposition is deferred at this time, awaiting improvement of current medical problems.  Anticipated discharge in approximately 0-1 day(s).   The patient does have a current PCP (Carly Montey Hora, MD) and does need an [facility] hospital follow-up appointment after discharge.  The patient does not have transportation limitations that hinder transportation to clinic appointments.  .Services Needed at time of discharge: Y = Yes, Blank = No PT:   OT:   RN:   Equipment:   Other:     LOS: 1 day   Sandy Loll, MD 03/05/2015, 1:02 PM

## 2015-03-05 NOTE — Progress Notes (Signed)
Pt agreed to receiving blood without any coercion. She stated she spoke with her cousin and he said it was okay to receive blood (despite their religion, Jehovah's Witness) pt is receiving 2 units of PRBC's second unit started at 1810.

## 2015-03-05 NOTE — Progress Notes (Signed)
INTERNAL MEDICINE TEACHING ATTENDING ADDENDUM - Maxamilian Amadon, MD: I reviewed and discussed at the time of visit with the resident Dr. Rivet, the patient's medical history, physical examination, diagnosis and results of pertinent tests and treatment and I agree with the patient's care as documented.  

## 2015-03-05 NOTE — Progress Notes (Signed)
[place] for Aranesp Indication: anemia  Allergies  Allergen Reactions  . Ivp Dye [Iodinated Diagnostic Agents] Nausea And Vomiting  . Pollen Extract     Seasonal allergies    Patient Measurements: Weight: 199 lb 11.8 oz (90.6 kg)  Vital Signs: Temp: 98.4 F (36.9 C) (04/19 2027) Temp Source: Oral (04/19 2027) BP: 118/65 mmHg (04/19 2027) Pulse Rate: 82 (04/19 2027)  Labs:  Recent Labs  03/04/15 1434  HGB 5.1*  HCT 18.2*  PLT 164    CrCl cannot be calculated (Patient has no serum creatinine result on file.).   Medical History: Past Medical History  Diagnosis Date  . Diabetes mellitus due to abnormal insulin   . Hypertension   . Congenital cerebral palsy   . Hearing impairment   . Schizoaffective disorder   . Depression     Medications:  Scheduled:  . loratadine  10 mg Oral Daily  . pravastatin  40 mg Oral Daily  . sodium chloride  3 mL Intravenous Q12H  . venlafaxine XR  150 mg Oral QHS  . venlafaxine XR  75 mg Oral Q breakfast  . vitamin B-12  1,000 mcg Oral Daily    Assessment: 44 yo female with iron deficiency anemia, Jehovah's witness refusing blood products, for Aranesp.  Fereheme 510 mg IV given 4/19     Plan:  Aranesp 40 mcg SQ q7days.   Ferrous sulfate 325 mg po TID Senokot-S daily PRN constipation  F/U CBC  Staci Dack, Bronson Curb 03/05/2015,12:08 AM

## 2015-03-05 NOTE — Progress Notes (Signed)
Spiritual Care received consult for AD and other concerns.   Chaplain attempted visit with [name]. Pt was sleeping.   Will inform Spiritual Care team for follow-up vit.   Delford Field, Chaplain 03/05/2015

## 2015-03-06 DIAGNOSIS — E785 Hyperlipidemia, unspecified: Secondary | ICD-10-CM | POA: Diagnosis not present

## 2015-03-06 DIAGNOSIS — D219 Benign neoplasm of connective and other soft tissue, unspecified: Secondary | ICD-10-CM | POA: Insufficient documentation

## 2015-03-06 DIAGNOSIS — D591 Other autoimmune hemolytic anemias: Secondary | ICD-10-CM

## 2015-03-06 DIAGNOSIS — D259 Leiomyoma of uterus, unspecified: Secondary | ICD-10-CM | POA: Diagnosis not present

## 2015-03-06 DIAGNOSIS — E119 Type 2 diabetes mellitus without complications: Secondary | ICD-10-CM | POA: Diagnosis not present

## 2015-03-06 DIAGNOSIS — I10 Essential (primary) hypertension: Secondary | ICD-10-CM | POA: Diagnosis not present

## 2015-03-06 DIAGNOSIS — N920 Excessive and frequent menstruation with regular cycle: Secondary | ICD-10-CM | POA: Diagnosis not present

## 2015-03-06 DIAGNOSIS — I4892 Unspecified atrial flutter: Secondary | ICD-10-CM | POA: Diagnosis not present

## 2015-03-06 LAB — CBC
HCT: 22.4 % — ABNORMAL LOW (ref 36.0–46.0)
HCT: 23.4 % — ABNORMAL LOW (ref 36.0–46.0)
Hemoglobin: 6.8 g/dL — CL (ref 12.0–15.0)
Hemoglobin: 7.3 g/dL — ABNORMAL LOW (ref 12.0–15.0)
MCH: 20.4 pg — ABNORMAL LOW (ref 26.0–34.0)
MCH: 20.7 pg — AB (ref 26.0–34.0)
MCHC: 30.4 g/dL (ref 30.0–36.0)
MCHC: 31.2 g/dL (ref 30.0–36.0)
MCV: 67.1 fL — ABNORMAL LOW (ref 78.0–100.0)
MCV: 66.5 fL — AB (ref 78.0–100.0)
PLATELETS: 132 10*3/uL — AB (ref 150–400)
Platelets: 148 10*3/uL — ABNORMAL LOW (ref 150–400)
RBC: 3.34 MIL/uL — AB (ref 3.87–5.11)
RBC: 3.52 MIL/uL — ABNORMAL LOW (ref 3.87–5.11)
RDW: 26.6 % — ABNORMAL HIGH (ref 11.5–15.5)
RDW: 26.6 % — ABNORMAL HIGH (ref 11.5–15.5)
WBC: 6 10*3/uL (ref 4.0–10.5)
WBC: 5.7 10*3/uL (ref 4.0–10.5)

## 2015-03-06 LAB — TYPE AND SCREEN
ABO/RH(D): O NEG
Antibody Screen: NEGATIVE
UNIT DIVISION: 0
Unit division: 0

## 2015-03-06 LAB — BASIC METABOLIC PANEL
Anion gap: 7 (ref 5–15)
CO2: 27 mmol/L (ref 19–32)
CREATININE: 0.67 mg/dL (ref 0.50–1.10)
Calcium: 8.6 mg/dL (ref 8.4–10.5)
Chloride: 108 mmol/L (ref 96–112)
GFR calc Af Amer: >90 mL/min (ref >90)
GFR calc non Af Amer: >90 mL/min (ref >90)
Glucose, Bld: 98 mg/dL (ref 70–99)
Potassium: 3.9 mmol/L (ref 3.5–5.1)
Sodium: 142 mmol/L (ref 135–145)

## 2015-03-06 LAB — GLUCOSE, CAPILLARY
GLUCOSE-CAPILLARY: 84 mg/dL (ref 70–99)
Glucose-Capillary: 109 mg/dL — ABNORMAL HIGH (ref 70–99)

## 2015-03-06 LAB — MAGNESIUM: MAGNESIUM: 2.3 mg/dL (ref 1.5–2.5)

## 2015-03-06 LAB — PHOSPHORUS: Phosphorus: 4 mg/dL (ref 2.3–4.6)

## 2015-03-06 MED ORDER — FERROUS SULFATE 325 (65 FE) MG PO TABS: 325.0000 mg | ORAL_TABLET | Freq: Three times a day (TID) | ORAL | Status: DC

## 2015-03-06 MED ORDER — ESTRADIOL VALERATE-DIENOGEST 3/2-2/2-3/1 MG PO TABS: 1.0000 | ORAL_TABLET | Freq: Every day | ORAL | Status: DC

## 2015-03-06 MED ORDER — SENNOSIDES-DOCUSATE SODIUM 8.6-50 MG PO TABS: 1.0000 | ORAL_TABLET | Freq: Every evening | ORAL | Status: DC | PRN

## 2015-03-06 MED ORDER — CYANOCOBALAMIN 1000 MCG PO TABS: 1000.0000 ug | ORAL_TABLET | Freq: Every day | ORAL | Status: DC

## 2015-03-06 NOTE — Discharge Instructions (Signed)
Fibroids Fibroids are lumps that can occur any place in a woman's body. These lumps are not cancerous. Fibroids vary in size, weight, and where they grow. HOME CARE  Do not take aspirin.  Write down the number of pads or tampons you use during your period. Tell your doctor. This can help determine the best treatment for you. GET HELP RIGHT AWAY IF:  You have pain in your lower belly (abdomen) that is not helped with medicine.  You have cramps that are not helped with medicine.  You have more bleeding between or during your period.  You feel lightheaded or pass out (faint).  Your lower belly pain gets worse. MAKE SURE YOU:  Understand these instructions.  Will watch your condition.  Will get help right away if you are not doing well or get worse. Document Released: 12/04/2010 Document Revised: 01/24/2012 Document Reviewed: 12/04/2010 [facility] Patient Information 2015 [place], [place]. This information is not intended to replace advice given to you by your health care provider. Make sure you discuss any questions you have with your health care provider.

## 2015-03-06 NOTE — Discharge Summary (Signed)
Name: [name] MRN: [id number] DOB: 07/28/1971 44 y.o. PCP: Juliet Rude, MD  Date of Admission: 03/04/2015  5:07 PM Date of Discharge: 03/06/2015 Attending Physician: Annia Belt, MD  Discharge Diagnosis: Principal Problem:   Symptomatic anemia Active Problems:   Essential hypertension   Type 2 diabetes mellitus   Depression, controlled   Hyperlipidemia LDL goal <70   Iron deficiency anemia due to chronic blood loss   Uterine fibroid  Discharge Medications:   Medication List    STOP taking these medications        lisinopril-hydrochlorothiazide 10-12.5 MG per tablet  Commonly known as:  PRINZIDE,ZESTORETIC      TAKE these medications        cetirizine 10 MG tablet  Commonly known as:  ZYRTEC  Take 30 mg by mouth daily.     cyanocobalamin 1000 MCG tablet  Take 1 tablet (1,000 mcg total) by mouth daily.     Estradiol Valerate-Dienogest 3/2-2/2-3/1 MG tablet  Commonly known as:  NATAZIA  Take 1 tablet by mouth daily. Start on first day of menstrual period     ferrous sulfate 325 (65 FE) MG tablet  Take 1 tablet (325 mg total) by mouth 3 (three) times daily with meals.     metFORMIN 500 MG tablet  Commonly known as:  GLUCOPHAGE  Take 1 tablet (500 mg total) by mouth 2 (two) times daily with a meal.     pravastatin 40 MG tablet  Commonly known as:  PRAVACHOL  TAKE ONE (1) TABLET BY MOUTH EVERY DAY     senna-docusate 8.6-50 MG per tablet  Commonly known as:  Senokot-S  Take 1 tablet by mouth at bedtime as needed for mild constipation.     venlafaxine XR 75 MG 24 hr capsule  Commonly known as:  EFFEXOR-XR  Take 75-150 mg by mouth 2 (two) times daily. TAKE ONE CAPSULE BY MOUTH IN THE MORNING AND TWO AT BEDTIME per patient        Disposition and follow-up:   [name] was discharged from [facility] in Stable condition.  At the hospital follow up visit please address:  1.  Symtomatic anemia: Hgb 5.1 on admission. She  was continued on ferrous sulfate 325mg  TID and vitamin B12 1022mcg daily. She was transfused with 2u pRBC on 03/05/2015. Hgb at discharge was stable at 7.3. Uterine fibroid: [place] with multifibroid uterus with extensive distortion and obscuration of the endometrium. She was started on estrogen-progestin contraceptive after discharge.  2.  Labs / imaging needed at time of follow-up: CBC  3.  Pending labs/ test needing follow-up: none  Follow-up Appointments:     Follow-up Information    Follow up with Albin Felling, MD On 03/13/2015.   Specialty:  Internal Medicine   Why:  3:45PM for hospital follow up.    Contact information:   [place] [address] [phone]       Follow up with Lavonia Drafts, MD On 04/07/2015.   Specialty:  Obstetrics and Gynecology   Why:  3:15PM for follow up of uterine fibroids. You may be called by the clinic for an earlier appointment if one becomes available.   Contact information:   [place] [address] [phone]       Discharge Instructions: Discharge Instructions    Call MD for:  difficulty breathing, headache or visual disturbances    Complete by:  As directed      Call MD  for:  persistant dizziness or light-headedness    Complete by:  As directed      Call MD for:  persistant nausea and vomiting    Complete by:  As directed      Call MD for:  severe uncontrolled pain    Complete by:  As directed      Call MD for:  temperature >100.4    Complete by:  As directed      Diet - low sodium heart healthy    Complete by:  As directed      Increase activity slowly    Complete by:  As directed            Consultations:    Procedures Performed:  US Transvaginal Non-ob  03/04/2015   CLINICAL DATA:  Fibroids.  EXAM: TRANSABDOMINAL AND TRANSVAGINAL ULTRASOUND OF PELVIS  TECHNIQUE: Both transabdominal and transvaginal ultrasound examinations of the pelvis were performed. Transabdominal technique was  performed for global imaging of the pelvis including uterus, ovaries, adnexal regions, and pelvic cul-de-sac. It was necessary to proceed with endovaginal exam following the transabdominal exam to visualize the endometrium.  COMPARISON:  11/26/2011  FINDINGS: Uterus  Measurements: 17 x 10 x 13 cm. There are numerous myometrial masses with heterogeneous, predominant hypoechoic appearance. The largest and measured are located in the sub serosal anterior body at 44 mm, intramural fundus measuring up to 52 mm, sub serosal posterior uterine body at the 53 mm, and posterior lower uterine segment at up to 66 mm.  Endometrium  Thickness: Where visualized 15 mm. Note that the majority the endometrium is obscured by the fibroid shadowing and distortion  Right ovary  Measurements: 5.7 x 4.6 x 4.9 cm. 2 cm peripherally echogenic cystic structure in the right ovary is compatible with a corpus luteum.  Left ovary  Measurements: 4.1 x 2.7 x 2.9 cm. Normal appearance/no adnexal mass.  Other findings  No free fluid.  IMPRESSION: 1. No acute findings. 2. Multifibroid uterus with extensive distortion and obscuration of the endometrium.   Electronically Signed   By: Monte Fantasia M.D.   On: 03/04/2015 22:26   US Pelvis Complete  03/04/2015   CLINICAL DATA:  Fibroids.  EXAM: TRANSABDOMINAL AND TRANSVAGINAL ULTRASOUND OF PELVIS  TECHNIQUE: Both transabdominal and transvaginal ultrasound examinations of the pelvis were performed. Transabdominal technique was performed for global imaging of the pelvis including uterus, ovaries, adnexal regions, and pelvic cul-de-sac. It was necessary to proceed with endovaginal exam following the transabdominal exam to visualize the endometrium.  COMPARISON:  11/26/2011  FINDINGS: Uterus  Measurements: 17 x 10 x 13 cm. There are numerous myometrial masses with heterogeneous, predominant hypoechoic appearance. The largest and measured are located in the sub serosal anterior body at 44 mm, intramural  fundus measuring up to 52 mm, sub serosal posterior uterine body at the 53 mm, and posterior lower uterine segment at up to 66 mm.  Endometrium  Thickness: Where visualized 15 mm. Note that the majority the endometrium is obscured by the fibroid shadowing and distortion  Right ovary  Measurements: 5.7 x 4.6 x 4.9 cm. 2 cm peripherally echogenic cystic structure in the right ovary is compatible with a corpus luteum.  Left ovary  Measurements: 4.1 x 2.7 x 2.9 cm. Normal appearance/no adnexal mass.  Other findings  No free fluid.  IMPRESSION: 1. No acute findings. 2. Multifibroid uterus with extensive distortion and obscuration of the endometrium.   Electronically Signed   By: Neva Seat.D.  On: 03/04/2015 22:26    Admission HPI: [name] is a 44 year old woman with history of HTN, T2DM, HLD, thyroid nodules, endometriosis admitted from clinic with symptomatic anemia. She reports she has been having dyspnea on exertion for the past week. She has also been fatigued. Denies previous episodes. She presented to clinic today and was found to have hgb of 5.1. She reports intermittent chest pain that is sharp and midsternal. She attributes it to anxiety. She denies syncope, lightheadedness, palpitations, melena, hematochezia, hematuria. She has been having heavy mentrual periods for the past few months. Her LMP ended 02/16/2015 and lasted 2 weeks. She had heavy bleeding the first week, having to change pads every 5 minutes for a few days. She was recently on xarelto for paroxysmal atrial flutter and this was stopped by her cardiologist.   Hospital Course by problem list: Principal Problem:   Symptomatic anemia Active Problems:   Essential hypertension   Type 2 diabetes mellitus   Depression, controlled   Hyperlipidemia LDL goal <70   Iron deficiency anemia due to chronic blood loss   Uterine fibroid   1. Symtomatic anemia: Hgb 5.1 on admission. Likely 2/2 abnormal uterine bleeding. Because of  her religious beliefs, she agreed to erythropoiesis-stimulating agents and intravenous iron. No continued bleeding. Coags wnl. Vitamin B12 low end of normal and folate wnl. Iron <10 and ferritin 4 consistent with iron deficiency. S/p IV Fereheme 510 x 1 on 03/04/2015. She was continued on ferrous sulfate 325mg  TID and vitamin B12 1045mcg daily. She decided the following day to accept blood transfusion. She was transfused with 2u pRBC on 03/05/2015. Hgb at discharge was stable at 7.3  2. Uterine fibroid: US pelvis 11/26/2011 with multifibroid uterus. Repeat US with multifibroid uterus with extensive distortion and obscuration of the endometrium. Negative UPT. She has outpatient gynecology follow up scheduled. She was started on estrogen-progestin contraceptive after discharge.  3. Essential hypertension: lisinopril-HCTZ 10/12.5mg  daily discontinued by cardiologist. She remained normotensive.   4. Type 2 diabetes mellitus: on metformin 500mg  BID at home. This was continued upon discharge.  5. Hyperlipidemia LDL goal <70: She was continued on home pravachol 40mg  QHS  Discharge Vitals:   BP 120/77 mmHg  Pulse 85  Temp(Src) 99.2 F (37.3 C) (Oral)  Resp 18  Wt 199 lb 15.3 oz (90.7 kg)  SpO2 100%  LMP 02/14/2015  Discharge Labs:  Results for orders placed or performed during the hospital encounter of 03/04/15 (from the past 24 hour(s))  Glucose, capillary     Status: None   Collection Time: 03/05/15 11:54 AM  Result Value Ref Range   Glucose-Capillary 98 70 - 99 mg/dL  Prepare RBC     Status: None   Collection Time: 03/05/15 12:11 PM  Result Value Ref Range   Order Confirmation ORDER PROCESSED BY BLOOD BANK   Type and screen     Status: None   Collection Time: 03/05/15 12:11 PM  Result Value Ref Range   ABO/RH(D) O NEG    Antibody Screen NEG    Sample Expiration 03/08/2015    Unit Number [id number]    Blood Component Type RBC LR PHER1    Unit division 00    Status of Unit  ISSUED,FINAL    Transfusion Status OK TO TRANSFUSE    Crossmatch Result Compatible    Unit Number [id number]    Blood Component Type RED CELLS,LR    Unit division 00    Status of Unit ISSUED,FINAL    Transfusion  Status OK TO TRANSFUSE    Crossmatch Result Compatible   ABO/Rh     Status: None   Collection Time: 03/05/15 12:11 PM  Result Value Ref Range   ABO/RH(D) O NEG   Glucose, capillary     Status: None   Collection Time: 03/05/15  4:47 PM  Result Value Ref Range   Glucose-Capillary 89 70 - 99 mg/dL  Glucose, capillary     Status: None   Collection Time: 03/05/15 10:45 PM  Result Value Ref Range   Glucose-Capillary 99 70 - 99 mg/dL  CBC     Status: Abnormal   Collection Time: 03/05/15 10:53 PM  Result Value Ref Range   WBC 6.0 4.0 - 10.5 K/uL   RBC 3.34 (L) 3.87 - 5.11 MIL/uL   Hemoglobin 6.8 (LL) 12.0 - 15.0 g/dL   HCT 22.4 (L) 36.0 - 46.0 %   MCV 67.1 (L) 78.0 - 100.0 fL   MCH 20.4 (L) 26.0 - 34.0 pg   MCHC 30.4 30.0 - 36.0 g/dL   RDW 26.6 (H) 11.5 - 15.5 %   Platelets 132 (L) 150 - 400 K/uL  Basic metabolic panel     Status: Abnormal   Collection Time: 03/06/15  6:31 AM  Result Value Ref Range   Sodium 142 135 - 145 mmol/L   Potassium 3.9 3.5 - 5.1 mmol/L   Chloride 108 96 - 112 mmol/L   CO2 27 19 - 32 mmol/L   Glucose, Bld 98 70 - 99 mg/dL   BUN <5 (L) 6 - 23 mg/dL   Creatinine, Ser 0.67 0.50 - 1.10 mg/dL   Calcium 8.6 8.4 - 10.5 mg/dL   GFR calc non Af Amer >90 >90 mL/min   GFR calc Af Amer >90 >90 mL/min   Anion gap 7 5 - 15  CBC     Status: Abnormal   Collection Time: 03/06/15  6:31 AM  Result Value Ref Range   WBC 5.7 4.0 - 10.5 K/uL   RBC 3.52 (L) 3.87 - 5.11 MIL/uL   Hemoglobin 7.3 (L) 12.0 - 15.0 g/dL   HCT 23.4 (L) 36.0 - 46.0 %   MCV 66.5 (L) 78.0 - 100.0 fL   MCH 20.7 (L) 26.0 - 34.0 pg   MCHC 31.2 30.0 - 36.0 g/dL   RDW 26.6 (H) 11.5 - 15.5 %   Platelets 148 (L) 150 - 400 K/uL  Magnesium     Status: None   Collection Time: 03/06/15   6:31 AM  Result Value Ref Range   Magnesium 2.3 1.5 - 2.5 mg/dL  Phosphorus     Status: None   Collection Time: 03/06/15  6:31 AM  Result Value Ref Range   Phosphorus 4.0 2.3 - 4.6 mg/dL  Glucose, capillary     Status: Abnormal   Collection Time: 03/06/15  8:32 AM  Result Value Ref Range   Glucose-Capillary 109 (H) 70 - 99 mg/dL    Signed: Milagros Loll, MD 03/06/2015, 11:09 AM    Services Ordered on Discharge: none Equipment Ordered on Discharge: none

## 2015-03-06 NOTE — Progress Notes (Signed)
Subjective: No acute events overnight. S/p 2u pRBC. She reports she is feeling good. Her dizziness has improved. Tolerating PO without nausea or vomiting. Denies chest pain or SOB.  Objective: Vital signs in last 24 hours: Filed Vitals:   03/05/15 1829 03/05/15 2032 03/05/15 2112 03/06/15 0455  BP: 119/67 100/54 123/76 114/70  Pulse: 79 82 80 79  Temp: 98.5 F (36.9 C) 97.9 F (36.6 C) 98 F (36.7 C) 98.1 F (36.7 C)  TempSrc: Oral Oral Oral Oral  Resp: 18 18 20 18   Weight:  199 lb 15.3 oz (90.7 kg)    SpO2: 100% 100% 100% 100%   Weight change: 3.5 oz (0.1 kg)  Intake/Output Summary (Last 24 hours) at 03/06/15 0732 Last data filed at 03/06/15 0600  Gross per 24 hour  Intake   1165 ml  Output    400 ml  Net    765 ml   General Apperance: NAD HEENT: Normocephalic, atraumatic, PERRL, EOMI, anicteric sclera Neck: Supple, trachea midline Lungs: Clear to auscultation bilaterally. No wheezes, rhonchi or rales. Breathing comfortably Heart: Regular rate and rhythm, no murmur/rub/gallop Abdomen: Soft, nontender, nondistended, no rebound/guarding Extremities: Normal, atraumatic, warm and well perfused, no edema Pulses: 2+ throughout Skin: No rashes or lesions Neurologic: Alert and oriented x 3. CNII-XII intact. Normal strength and sensation  Lab Results: Basic Metabolic Panel:  Recent Labs Lab 03/05/15 0502 03/06/15 0631  NA 139 142  K 3.4* 3.9  CL 105 108  CO2 25 27  GLUCOSE 102* 98  BUN <5* <5*  CREATININE 0.80 0.67  CALCIUM 8.7 8.6  MG  --  2.3  PHOS  --  4.0   Liver Function Tests:  Recent Labs Lab 03/05/15 0502  AST 13  ALT 10  ALKPHOS 52  BILITOT 0.4  PROT 6.0  ALBUMIN 3.1*   CBC:  Recent Labs Lab 03/04/15 1434  03/05/15 2253 03/06/15 0631  WBC 4.8  < > 6.0 5.7  NEUTROABS 2.4  --   --   --   HGB 5.1*  < > 6.8* 7.3*  HCT 18.2*  < > 22.4* 23.4*  MCV 60.3*  < > 67.1* 66.5*  PLT 164  < > 132* 148*  < > = values in this interval not  displayed. CBG:  Recent Labs Lab 03/04/15 2230 03/05/15 0745 03/05/15 1003 03/05/15 1154 03/05/15 1647 03/05/15 2245  GLUCAP 136* 100* 104* 98 89 99    Recent Labs Lab 03/05/15 0502  LABPROT 14.9  INR 1.16   Anemia Panel:  Recent Labs Lab 03/04/15 1436  VITAMINB12 211  FERRITIN 4*  TIBC SEE NOTE  IRON <10*  RETICCTPCT 1.2   Misc. Labs: Urine pregnancy test 03/04/2015: negative  Studies/Results: US Transvaginal Non-ob  03/04/2015   CLINICAL DATA:  Fibroids.  EXAM: TRANSABDOMINAL AND TRANSVAGINAL ULTRASOUND OF PELVIS  TECHNIQUE: Both transabdominal and transvaginal ultrasound examinations of the pelvis were performed. Transabdominal technique was performed for global imaging of the pelvis including uterus, ovaries, adnexal regions, and pelvic cul-de-sac. It was necessary to proceed with endovaginal exam following the transabdominal exam to visualize the endometrium.  COMPARISON:  11/26/2011  FINDINGS: Uterus  Measurements: 17 x 10 x 13 cm. There are numerous myometrial masses with heterogeneous, predominant hypoechoic appearance. The largest and measured are located in the sub serosal anterior body at 44 mm, intramural fundus measuring up to 52 mm, sub serosal posterior uterine body at the 53 mm, and posterior lower uterine segment at up to 66 mm.  Endometrium  Thickness: Where visualized 15 mm. Note that the majority the endometrium is obscured by the fibroid shadowing and distortion  Right ovary  Measurements: 5.7 x 4.6 x 4.9 cm. 2 cm peripherally echogenic cystic structure in the right ovary is compatible with a corpus luteum.  Left ovary  Measurements: 4.1 x 2.7 x 2.9 cm. Normal appearance/no adnexal mass.  Other findings  No free fluid.  IMPRESSION: 1. No acute findings. 2. Multifibroid uterus with extensive distortion and obscuration of the endometrium.   Electronically Signed   By: Monte Fantasia M.D.   On: 03/04/2015 22:26   US Pelvis Complete  03/04/2015   CLINICAL DATA:   Fibroids.  EXAM: TRANSABDOMINAL AND TRANSVAGINAL ULTRASOUND OF PELVIS  TECHNIQUE: Both transabdominal and transvaginal ultrasound examinations of the pelvis were performed. Transabdominal technique was performed for global imaging of the pelvis including uterus, ovaries, adnexal regions, and pelvic cul-de-sac. It was necessary to proceed with endovaginal exam following the transabdominal exam to visualize the endometrium.  COMPARISON:  11/26/2011  FINDINGS: Uterus  Measurements: 17 x 10 x 13 cm. There are numerous myometrial masses with heterogeneous, predominant hypoechoic appearance. The largest and measured are located in the sub serosal anterior body at 44 mm, intramural fundus measuring up to 52 mm, sub serosal posterior uterine body at the 53 mm, and posterior lower uterine segment at up to 66 mm.  Endometrium  Thickness: Where visualized 15 mm. Note that the majority the endometrium is obscured by the fibroid shadowing and distortion  Right ovary  Measurements: 5.7 x 4.6 x 4.9 cm. 2 cm peripherally echogenic cystic structure in the right ovary is compatible with a corpus luteum.  Left ovary  Measurements: 4.1 x 2.7 x 2.9 cm. Normal appearance/no adnexal mass.  Other findings  No free fluid.  IMPRESSION: 1. No acute findings. 2. Multifibroid uterus with extensive distortion and obscuration of the endometrium.   Electronically Signed   By: Monte Fantasia M.D.   On: 03/04/2015 22:26   Medications: I have reviewed the patient's current medications. Scheduled Meds: . ferrous sulfate  325 mg Oral TID WC  . loratadine  10 mg Oral Daily  . pravastatin  40 mg Oral Daily  . sodium chloride  3 mL Intravenous Q12H  . venlafaxine XR  150 mg Oral QHS  . venlafaxine XR  75 mg Oral Q breakfast  . vitamin B-12  1,000 mcg Oral Daily   Continuous Infusions:  PRN Meds:.acetaminophen, senna-docusate Assessment/Plan: Principal Problem:   Symptomatic anemia Active Problems:   Essential hypertension   Type 2  diabetes mellitus   Depression, controlled   Hyperlipidemia LDL goal <70   Iron deficiency anemia due to chronic blood loss   Uterine fibroid  Symtomatic anemia: Hgb 5.1 on admission. Likely 2/2 abnormal uterine bleeding. No continued bleeding. Coags wnl. Vitamin B12 low end of normal and folate wnl. Iron <10 and ferritin 4 consistent with iron deficiency. S/p IV Fereheme 510 x 1 on 03/04/2015. Now s/p 2u pRBC on 03/05/2015. Hgb this AM 7.3 -ferrous sulfate 325mg  TID -vitamin B12 107mcg daily -Senokot-S 1 tab daily prn constipation  Abnormal uterine bleeding: US pelvis 11/26/2011 with multifibroid uterus. Repeat US with multifibroid uterus with extensive distortion and obscuration of the endometrium. Negative UPT. -Outpatient gynecology follow up -Start estrogen-progestin contraceptive after discharge  HTN: lisinopril-HCTZ 10/12.5mg  daily discontinued by cardiologist. Presently normotensive. -home medication held -may consider restarting home Metoprolol succinate 25 mg daily if she becomes hypertensive  T2DM: on metformin 500mg   BID at home -Metformin held -CBG QAC/HS  HLD: continue home pravachol 40mg  QHS  FEN: carb modified  VTE ppx: SCDs  Dispo: Likely home today  The patient does have a current PCP (Carly Montey Hora, MD) and does need an [facility] hospital follow-up appointment after discharge.  The patient does not have transportation limitations that hinder transportation to clinic appointments.  .Services Needed at time of discharge: Y = Yes, Blank = No PT:   OT:   RN:   Equipment:   Other:     LOS: 2 days   Milagros Loll, MD 03/06/2015, 7:32 AM

## 2015-03-06 NOTE — Progress Notes (Signed)
UR completed 

## 2015-03-06 NOTE — Progress Notes (Signed)
Patient discharge teaching given, including activity, diet, follow-up appoints, and medications. Patient verbalized understanding of all discharge instructions. IV access was d/c'd. Vitals are stable. Skin is intact except as charted in most recent assessments. Pt to be escorted out by NT, to be driven home by the pt's [facility] caseworker.  Jillyn Ledger, MBA, BS, RN

## 2015-03-06 NOTE — Progress Notes (Signed)
Patient ID: [name], female   DOB: 17-Oct-1971, 44 y.o.   MRN: [id number] Medicine attending discharge note: I personally interviewed and examined this patient on the day of discharge and I tested the accuracy of the evaluation and discharge plan as recorded by resident physician Dr. Jacques Earthly.  Clinical summary: 44 year old woman  followed in the internal medicine clinic for type 2 diabetes, hypertension, intermittent atrial arrhythmias and hyperlipidemia. She has developed menorrhagia related to large uterine fibroids. She was not taking iron supplements. She has not been evaluated by a gynecologist. She presented to the clinic on the day of admission with nonspecific weakness and dyspnea on exertion and was found to be severely anemic with a hemoglobin of 5.1, MCV 60. Most recent hemoglobin on record from 2 years ago on 03/21/2013 was 11.9 with MCV 83. Of note religious preferences Jehovah witness.  Hospital course: She had just finished her menstrual cycle. No active bleeding at time of presentation. On initial exam by Dr. Randell Patient, she was in no distress. Blood pressure 114/58, pulse 70 and regular, oxygen saturation 100% on room air, respirations 18/m. Suggestion of a mass in the right lower quadrant/likely underlying fibroid uterus.  We initially tried to maximize her ability to make blood by giving her a dose of parenteral iron and initiating Aranesp erythrocyte stimulating agent. The patient then made the decision that she did wish to proceed with a blood transfusion. She received 2 packed units without complication. Hemoglobin rose to a peak value of 7.3 g at time of discharge. She will continue on oral iron supplement. We will prescribe a high-dose estrogen oral contraceptive to begin at the initiation of her next menstrual cycle. We will arrange short-term follow-up with a gynecologist for further evaluation of her menorrhagia and fibroid uterus  Disposition: She is discharged  in stable condition. There were no complications. Follow-up in general internal medicine clinic. OB/GYN referral. This will be arranged prior to hospital discharge.

## 2015-03-11 ENCOUNTER — Telehealth: Payer: Self-pay | Admitting: *Deleted

## 2015-03-11 NOTE — Telephone Encounter (Signed)
Mother of pt called - has not seen daughter past week - moved out of house.  Off psy med - hearing voices in right ear. Pt is deaf right ear. Has talked with [place] and police dept in [place]. Mother states has noted changes in daughter before she went missing.[name] to keep in touch with police. Hilda Blades Abeeha Twist RN 03/11/15 3PM

## 2015-03-13 ENCOUNTER — Encounter: Payer: Self-pay | Admitting: Internal Medicine

## 2015-03-13 ENCOUNTER — Ambulatory Visit (INDEPENDENT_AMBULATORY_CARE_PROVIDER_SITE_OTHER): Payer: Medicare Other | Admitting: Internal Medicine

## 2015-03-13 VITALS — BP 141/77 | HR 84 | Temp 98.4°F | Wt 196.7 lb

## 2015-03-13 DIAGNOSIS — I10 Essential (primary) hypertension: Secondary | ICD-10-CM | POA: Diagnosis present

## 2015-03-13 DIAGNOSIS — D6489 Other specified anemias: Secondary | ICD-10-CM | POA: Diagnosis present

## 2015-03-13 DIAGNOSIS — D649 Anemia, unspecified: Secondary | ICD-10-CM | POA: Diagnosis not present

## 2015-03-13 DIAGNOSIS — N6019 Diffuse cystic mastopathy of unspecified breast: Secondary | ICD-10-CM

## 2015-03-13 DIAGNOSIS — R42 Dizziness and giddiness: Secondary | ICD-10-CM | POA: Diagnosis not present

## 2015-03-13 DIAGNOSIS — R011 Cardiac murmur, unspecified: Secondary | ICD-10-CM

## 2015-03-13 DIAGNOSIS — E119 Type 2 diabetes mellitus without complications: Secondary | ICD-10-CM | POA: Diagnosis not present

## 2015-03-13 LAB — CBC
HCT: 27.6 % — ABNORMAL LOW (ref 36.0–46.0)
Hemoglobin: 8.3 g/dL — ABNORMAL LOW (ref 12.0–15.0)
MCH: 22.3 pg — ABNORMAL LOW (ref 26.0–34.0)
MCHC: 30.1 g/dL (ref 30.0–36.0)
MCV: 74 fL — AB (ref 78.0–100.0)
Platelets: 227 10*3/uL (ref 150–400)
RBC: 3.73 MIL/uL — AB (ref 3.87–5.11)
RDW: 33.5 % — AB (ref 11.5–15.5)
WBC: 5.3 10*3/uL (ref 4.0–10.5)

## 2015-03-13 LAB — POCT GLYCOSYLATED HEMOGLOBIN (HGB A1C): HEMOGLOBIN A1C: 5.8

## 2015-03-13 LAB — GLUCOSE, CAPILLARY
GLUCOSE-CAPILLARY: 88 mg/dL (ref 70–99)
Glucose-Capillary: 88 mg/dL (ref 70–99)

## 2015-03-13 LAB — METHYLMALONIC ACID(MMA), RND URINE
Creatinine, Urine mg/dL-MMAURN: 15.18 mmol/L (ref 2.38–26.55)
METHYLMALONIC ACID UR: 0.7 mmol/mol{creat} (ref 0.3–1.9)

## 2015-03-13 NOTE — Assessment & Plan Note (Signed)
Lab Results  Component Value Date   HGBA1C 5.8 03/13/2015   HGBA1C 6.1* 03/04/2015   HGBA1C 6.9 09/10/2014     Assessment: Diabetes control: good control (HgbA1C at goal) Progress toward A1C goal:  at goal Comments: HbA1c under 6.0. Likely that patient can be titrated down from her Metformin.   Plan: Medications:  Continue Metformin 500 mg BID. Consider decreasing to 500 mg daily at next visit.  Home glucose monitoring: Frequency: once a day Timing: before breakfast Instruction/counseling given: reminded to bring blood glucose meter & log to each visit, reminded to bring medications to each visit, discussed the need for weight loss and discussed diet Other plans:  - Likely that patient can be titrated off her Metformin especially if she works on her weight loss

## 2015-03-13 NOTE — Progress Notes (Signed)
   Subjective:    Patient ID: [name], female    DOB: 1971/03/11, 44 y.o.   MRN: [id number]  HPI [name] is a 45yo woman with PMHx of HTN, Type 2 DM, uterine fibroids, and hyperlipidemia who presents today for a hospital follow up visit.   Patient was hospitalized from 4/19-4/21 for symptomatic anemia due to abnormal uterine bleeding from her multifibroid uterus. Her Hbg was 5.1 on admission. She initially refused blood products due to her religious beliefs, but then decided to accept a blood transfusion and she received 2 units PRBCs. Her Hbg improved to 7.3 at discharge. She was discharged on ferrous sulfate 325 mg TID and vitamin B12 1000 mcg daily, as well as estrogen-progestin contraceptive pills to help her abnormal uterine bleeding.   Today, patient reports she is feeling dizzy and lightheaded similar to when I admitted her from clinic with symptomatic anemia on 4/19. She states she feels that she is going to pass out. She reports occasional SOB since discharge, but denies chest pain and palpitations. She reports she is currently menstruating and is on day 5 of her period. She reports she is not bleeding as heavy as previously. She states she has been taking her iron and vitamin B12 supplementation, and her birth control pills. She has an appointment with OBGYN on May 23rd.    Review of Systems General: Denies fever, chills, night sweats, changes in weight, changes in appetite HEENT: Reports headache. Denies ear pain, changes in vision, rhinorrhea, sore throat CV: Denies orthopnea Pulm: Denies cough, wheezing GI: Denies abdominal pain, nausea, vomiting, diarrhea, constipation, melena, hematochezia GU: Denies dysuria, hematuria, frequency Msk: Denies muscle cramps, joint pains Neuro: Denies weakness, numbness, tingling Skin: Denies rashes, bruising    Objective:   Physical Exam Orthostatics: Laying 127/81, HR 63 Sitting 134/85, HR 70 Standing 124/86, HR 70  General:  sitting up in chair, NAD HEENT: [place]/AT, EOMI, mucus membranes moist CV: RRR, no m/g/r Pulm: CTA bilaterally, breaths non-labored  Abd: BS+, soft, obese, non-tender  Ext: warm, no edema, moves all Neuro: alert and oriented x 3, no focal deficits     Assessment & Plan:  Please refer to A&P documentation.

## 2015-03-13 NOTE — Assessment & Plan Note (Signed)
Patient here for hospital follow up for her anemia. Repeat Hbg today was 8.3. She reports good compliance with her iron and vitamin B12 supplementation and is taking her estrogen-progestin contraceptive. She has follow up with OBGYN on May 23rd.  - Continue iron and vitamin B12 - Continue estrogen-progestin pill

## 2015-03-13 NOTE — Patient Instructions (Signed)
-   Continue taking your iron and vitamin B12 pills - Continue taking your birth control pills - If you have significant bleeding from your period again or have symptoms of lightheadedness, dizziness, or passing out, please call the clinic or go to the emergency room  - Please make your appointment on May 23rd at 3:15 at Dr. Vladimir Creeks office (OB-GYN)  General Instructions:   Please bring your medicines with you each time you come to clinic.  Medicines may include prescription medications, over-the-counter medications, herbal remedies, eye drops, vitamins, or other pills.   Progress Toward Treatment Goals:  Treatment Goal 03/04/2015  Hemoglobin A1C at goal  Blood pressure at goal    Self Care Goals & Plans:  Self Care Goal 09/10/2014  Manage my medications take my medicines as prescribed; bring my medications to every visit; refill my medications on time  Monitor my health bring my glucose meter and log to each visit; check my feet daily  Eat healthy foods drink diet soda or water instead of juice or soda; eat more vegetables; eat baked foods instead of fried foods  Meeting treatment goals -    Home Blood Glucose Monitoring 09/10/2014  Check my blood sugar once a day  When to check my blood sugar before breakfast     Care Management & Community Referrals:  Referral 07/30/2014  Referrals made for care management support none needed

## 2015-03-13 NOTE — Assessment & Plan Note (Signed)
Patient noted dizziness and lightheadedness today. She reported she is currently menstruating and there was initial concern for symptomatic anemia again but her Hbg today was stable at 8.3. She reported headache and occasional dyspnea, but denied palpitations and vertigo. Her orthostatics were negative. Possible that she is feeling dizzy from her Hbg still being low from her baseline Hbg 11-12. Patient was recommended to come back to the clinic if her symptoms do not improve or worsen.  - Continue to monitor

## 2015-03-13 NOTE — Assessment & Plan Note (Signed)
BP Readings from Last 3 Encounters:  03/13/15 141/77  03/06/15 120/77  03/04/15 130/74    Lab Results  Component Value Date   NA 142 03/06/2015   K 3.9 03/06/2015   CREATININE 0.67 03/06/2015    Assessment: Blood pressure control: controlled Progress toward BP goal:  at goal Comments: BP well controlled.   Plan: Medications:  Continue Metoprolol 25 mg daily

## 2015-03-17 NOTE — Progress Notes (Signed)
Case discussed with Dr. Arcelia Jew soon after the resident saw the patient.  We reviewed the resident's history and exam and pertinent patient test results.  I agree with the assessment, diagnosis, and plan of care documented in the resident's note.  Agree that metformin dose can be decreased if Hgb A1C remains below 6.5.  We will reassess once hemoglobin stabilizes given impact of transfused blood and rapid marrow turnover to meet circulating Hgb needs in setting of recent symptomatic anemia.

## 2015-04-07 ENCOUNTER — Encounter: Payer: Self-pay | Admitting: Obstetrics & Gynecology

## 2015-04-07 ENCOUNTER — Ambulatory Visit (INDEPENDENT_AMBULATORY_CARE_PROVIDER_SITE_OTHER): Payer: Medicare Other | Admitting: Obstetrics & Gynecology

## 2015-04-07 VITALS — BP 140/92 | HR 71 | Ht 64.0 in | Wt 198.8 lb

## 2015-04-07 DIAGNOSIS — N939 Abnormal uterine and vaginal bleeding, unspecified: Secondary | ICD-10-CM

## 2015-04-07 LAB — POCT PREGNANCY, URINE: Preg Test, Ur: NEGATIVE

## 2015-04-07 MED ORDER — MEGESTROL ACETATE 40 MG PO TABS: 40.0000 mg | ORAL_TABLET | Freq: Three times a day (TID) | ORAL | Status: DC

## 2015-04-07 NOTE — Progress Notes (Signed)
Patient ID: [name], female   DOB: [birth year], 44 y.o.   MRN: [id number] CLINIC ENCOUNTER NOTE  History:  44 y.o. G1P1000 here today for AUB.  She reports heavy prolonged menses.  She reports h/o anemia with recent admit to hosp or sx anemia after passing out.  She received a blood transfusion for a Hgb of 6.  She does not want to be examined today because she started bleeding heavily.  She reports that she is living in a shelter but does not want to give details. She is married but the spouse is not at the shelter with her. She has been sexually active for 16+ years with no subsequent pregnancy.  She was placed on OCPs in the hosp but she started bleeding heavy again today.   Past Medical History  Diagnosis Date  . Diabetes mellitus due to abnormal insulin   . Hypertension   . Congenital cerebral palsy   . Hearing impairment   . Schizoaffective disorder   . Depression   . Anemia   . Endometriosis     Past Surgical History  Procedure Laterality Date  . Cesarean section    . Exploratory laparotomy with abdominal mass excision    myomoectomy  The following portions of the patient's history were reviewed and updated as appropriate: allergies, current medications, past family history, past medical history, past social history, past surgical history and problem list.   Health Maintenance:  Normal pap and negative HRHPV on 03/04/2015.  Abormal mammogram with biopsies revealing fibrocystic changes on 02/2015   Review of Systems:   Comprehensive review of systems was otherwise negative.  Objective:  Physical Exam BP 140/92 mmHg  Pulse 71  Ht 5\' 4"  (1.626 m)  Wt 198 lb 12.8 oz (90.175 kg)  BMI 34.11 kg/m2  LMP 04/06/2015 CONSTITUTIONAL: Well-developed, well-nourished female in no acute distress.  HENT:  Normocephalic, atraumatic, External right and left ear normal. Oropharynx is clear and moist EYES: Conjunctivae and EOM are normal. Pupils are equal, round, and reactive to light.  No scleral icterus.  NECK: Normal range of motion, supple, no masses SKIN: Skin is warm and dry. No rash noted. Not diaphoretic. No erythema. No pallor. [place]: Alert and oriented to person, place, and time. Normal reflexes, muscle tone coordination. No cranial nerve deficit noted. PSYCHIATRIC: Normal mood and affect. Normal behavior. Normal judgment and thought content. CARDIOVASCULAR: Normal heart rate noted RESPIRATORY: Effort and breath sounds normal, no problems with respiration noted ABDOMEN: Soft, no distention noted.  No tenderness, rebound or guarding.  PELVIC: bimanual only! Normal appearing external genitalia;  Normal appearing discharge.  Uterus at umbilicus, no other palpable masses, no uterine or adnexal tenderness- exam limited by pts body habitus.    Labs and Imaging 03/04/2015 CLINICAL DATA: Fibroids.  EXAM: TRANSABDOMINAL AND TRANSVAGINAL ULTRASOUND OF PELVIS  TECHNIQUE: Both transabdominal and transvaginal ultrasound examinations of the pelvis were performed. Transabdominal technique was performed for global imaging of the pelvis including uterus, ovaries, adnexal regions, and pelvic cul-de-sac. It was necessary to proceed with endovaginal exam following the transabdominal exam to visualize the endometrium.  COMPARISON: 11/26/2011  FINDINGS: Uterus  Measurements: 17 x 10 x 13 cm. There are numerous myometrial masses with heterogeneous, predominant hypoechoic appearance. The largest and measured are located in the sub serosal anterior body at 44 mm, intramural fundus measuring up to 52 mm, sub serosal posterior uterine body at the 53 mm, and posterior lower uterine segment at up to 66 mm.  Endometrium  Thickness: Where  visualized 15 mm. Note that the majority the endometrium is obscured by the fibroid shadowing and distortion  Right ovary  Measurements: 5.7 x 4.6 x 4.9 cm. 2 cm peripherally echogenic cystic structure in the right ovary is  compatible with a corpus luteum.  Left ovary  Measurements: 4.1 x 2.7 x 2.9 cm. Normal appearance/no adnexal mass.  Other findings  No free fluid.  IMPRESSION: 1. No acute findings. 2. Multifibroid uterus with extensive distortion and obscuration of the endometrium.    Assessment & Plan:  Sx uterine fibroids leading to anemia and blood transfusion.  Pt decljnes endo bx today due to bleeding.  Bimanual as above. Discussed with pt TAH vs myomectomy. Pt interested in preserving her fertility.  I did inform her that the likelihood of further conception is low given her age AND the fact that she has not been on contraception for 16 years and she has not conceive. I reviewed the risk and benefits of each.  She wants to discuss this with her husband.  I concur.    Return for endometrial biopsy 4-6 weeks Megace 40mg  tid Stop OCPs' Total face-to-face time with patient: 40 minutes. Over 50% of encounter was spent on counseling and coordination of care.   Shericka Johnstone L. Ihor Dow, MD, [place] Attending [place] for [employer], [place]

## 2015-04-07 NOTE — Patient Instructions (Addendum)
Uterine Fibroid A uterine fibroid is a growth (tumor) that occurs in your uterus. This type of tumor is not cancerous and does not spread out of the uterus. You can have one or many fibroids. Fibroids can vary in size, weight, and where they grow in the uterus. Some can become quite large. Most fibroids do not require medical treatment, but some can cause pain or heavy bleeding during and between periods. CAUSES  A fibroid is the result of a single uterine cell that keeps growing (unregulated), which is different than most cells in the human body. Most cells have a control mechanism that keeps them from reproducing without control.  SIGNS AND SYMPTOMS   Bleeding.  Pelvic pain and pressure.  Bladder problems due to the size of the fibroid.  Infertility and miscarriages depending on the size and location of the fibroid. DIAGNOSIS  Uterine fibroids are diagnosed through a physical exam. Your health care provider may feel the lumpy tumors during a pelvic exam. Ultrasonography may be done to get information regarding size, location, and number of tumors.  TREATMENT   Your health care provider may recommend watchful waiting. This involves getting the fibroid checked by your health care provider to see if it grows or shrinks.   Hormone treatment or an intrauterine device (IUD) may be prescribed.   Surgery may be needed to remove the fibroids (myomectomy) or the uterus (hysterectomy). This depends on your situation. When fibroids interfere with fertility and a woman wants to become pregnant, a health care provider may recommend having the fibroids removed.  HOME CARE INSTRUCTIONS  Home care depends on how you were treated. In general:   Keep all follow-up appointments with your health care provider.   Only take over-the-counter or prescription medicines as directed by your health care provider. If you were prescribed a hormone treatment, take the hormone medicines exactly as directed. Do not  take aspirin. It can cause bleeding.   Talk to your health care provider about taking iron pills.  If your periods are troublesome but not so heavy, lie down with your feet raised slightly above your heart. Place cold packs on your lower abdomen.   If your periods are heavy, write down the number of pads or tampons you use per month. Bring this information to your health care provider.   Include green vegetables in your diet.  SEEK IMMEDIATE MEDICAL CARE IF:  You have pelvic pain or cramps not controlled with medicines.   You have a sudden increase in pelvic pain.   You have an increase in bleeding between and during periods.   You have excessive periods and soak tampons or pads in a half hour or less.  You feel lightheaded or have fainting episodes. Document Released: 10/29/2000 Document Revised: 08/22/2013 Document Reviewed: 05/31/2013 ExitCare Patient Information 2015 [employer]. This information is not intended to replace advice given to you by your health care provider. Make sure you discuss any questions you have with your health care provider. Abdominal Hysterectomy Abdominal hysterectomy is a surgical procedure to remove your womb (uterus). Your uterus is the muscular organ that contains a developing baby. This surgery is done for many reasons. You may need an abdominal hysterectomy if you have cancer, growths (tumors), long-term pain, or bleeding. You may also have this procedure if your uterus has slipped down into your vagina (uterine prolapse). Depending on why you need an abdominal hysterectomy, you may also have other reproductive organs removed. These could include the part of   your vagina that connects with your uterus (cervix), the organs that make eggs (ovaries), and the tubes that connect the ovaries to the uterus (fallopian tubes). LET YOUR HEALTH CARE PROVIDER KNOW ABOUT:   Any allergies you have.  All medicines you are taking, including vitamins, herbs,  eye drops, creams, and over-the-counter medicines.  Previous problems you or members of your family have had with the use of anesthetics.  Any blood disorders you have.  Previous surgeries you have had.  Medical conditions you have. RISKS AND COMPLICATIONS Generally, this is a safe procedure. However, as with any procedure, problems can occur. Infection is the most common problem after an abdominal hysterectomy. Other possible problems include:  Bleeding.  Formation of blood clots that may break free and travel to your lungs.  Injury to other organs near your uterus.  Nerve injury causing nerve pain.  Decreased interest in sex or pain during sexual intercourse. BEFORE THE PROCEDURE  Abdominal hysterectomy is a major surgical procedure. It can affect the way you feel about yourself. Talk to your health care provider about the physical and emotional changes hysterectomy may cause.  You may need to have blood work and X-rays done before surgery.  Quit smoking if you smoke. Ask your health care provider for help if you are struggling to quit.  Stop taking medicines that thin your blood as directed by your health care provider.  You may be instructed to take antibiotic medicines or laxatives before surgery.  Do not eat or drink anything for 6-8 hours before surgery.  Take your regular medicines with a small sip of water.  Bathe or shower the night or morning before surgery. PROCEDURE  Abdominal hysterectomy is done in the operating room at the hospital.  In most cases, you will be given a medicine that makes you go to sleep (general anesthetic).  The surgeon will make a cut (incision) through the skin in your lower belly.  The incision may be about 5-7 inches long. It may go side-to-side or up-and-down.  The surgeon will move aside the body tissue that covers your uterus. The surgeon will then carefully take out your uterus along with any of your other reproductive organs  that need to be removed.  Bleeding will be controlled with clamps or sutures.  The surgeon will close your incision with sutures or metal clips. AFTER THE PROCEDURE  You will have some pain immediately after the procedure.  You will be given pain medicine in the recovery room.  You will be taken to your hospital room when you have recovered from the anesthesia.  You may need to stay in the hospital for 2-5 days.  You will be given instructions for recovery at home. Document Released: 11/06/2013 Document Reviewed: 11/06/2013 ExitCare Patient Information 2015 [employer]. This information is not intended to replace advice given to you by your health care provider. Make sure you discuss any questions you have with your health care provider.  

## 2015-04-08 NOTE — Addendum Note (Signed)
Addended by: Hulan Fray on: 04/08/2015 06:39 AM   Modules accepted: Orders

## 2015-04-17 DIAGNOSIS — F319 Bipolar disorder, unspecified: Secondary | ICD-10-CM | POA: Diagnosis not present

## 2015-05-05 ENCOUNTER — Other Ambulatory Visit: Payer: Self-pay | Admitting: Obstetrics & Gynecology

## 2015-05-15 ENCOUNTER — Telehealth: Payer: Self-pay | Admitting: *Deleted

## 2015-05-15 NOTE — Telephone Encounter (Signed)
Pt called - past 2 weeks problem with sinus. Wants an antibiotic. Talked with pt - needs to be seen - C. Cyndi Bender will call pt regarding an appt. If unable to see - suggest Urgent Care. Hilda Blades Lakeya Mulka RN 05/15/15 11:20AM

## 2015-05-20 ENCOUNTER — Other Ambulatory Visit: Payer: Self-pay | Admitting: Pulmonary Disease

## 2015-05-21 ENCOUNTER — Encounter: Payer: Self-pay | Admitting: Internal Medicine

## 2015-05-21 ENCOUNTER — Ambulatory Visit (INDEPENDENT_AMBULATORY_CARE_PROVIDER_SITE_OTHER): Payer: Medicare Other | Admitting: Internal Medicine

## 2015-05-21 VITALS — BP 134/80 | HR 76 | Temp 97.7°F | Ht 64.0 in | Wt 199.4 lb

## 2015-05-21 DIAGNOSIS — R05 Cough: Secondary | ICD-10-CM | POA: Diagnosis not present

## 2015-05-21 DIAGNOSIS — D259 Leiomyoma of uterus, unspecified: Secondary | ICD-10-CM

## 2015-05-21 DIAGNOSIS — I10 Essential (primary) hypertension: Secondary | ICD-10-CM | POA: Diagnosis not present

## 2015-05-21 DIAGNOSIS — N939 Abnormal uterine and vaginal bleeding, unspecified: Secondary | ICD-10-CM

## 2015-05-21 DIAGNOSIS — D500 Iron deficiency anemia secondary to blood loss (chronic): Secondary | ICD-10-CM

## 2015-05-21 DIAGNOSIS — R059 Cough, unspecified: Secondary | ICD-10-CM | POA: Insufficient documentation

## 2015-05-21 LAB — CBC
HEMATOCRIT: 38.6 % (ref 36.0–46.0)
Hemoglobin: 12.7 g/dL (ref 12.0–15.0)
MCH: 27.3 pg (ref 26.0–34.0)
MCHC: 32.9 g/dL (ref 30.0–36.0)
MCV: 82.8 fL (ref 78.0–100.0)
PLATELETS: 267 10*3/uL (ref 150–400)
RBC: 4.66 MIL/uL (ref 3.87–5.11)
RDW: 21.6 % — AB (ref 11.5–15.5)
WBC: 5.5 10*3/uL (ref 4.0–10.5)

## 2015-05-21 MED ORDER — LISINOPRIL-HYDROCHLOROTHIAZIDE 10-12.5 MG PO TABS: 1.0000 | ORAL_TABLET | Freq: Every day | ORAL | Status: DC

## 2015-05-21 NOTE — Progress Notes (Signed)
Subjective:    Patient ID: [name], female    DOB: Aug 22, 1971, 44 y.o.   MRN: [id number] CC: cough, htn medicine question, and obgyn question HPI [name] is a 44 yo woman with notable PMH of HTN, T2DM, uterine fibroids, anemia with recent blood transfusion, HLD, and cerebral palsy, who is here to talk about cough she has been having or 2 weeks, HTN medications, and concern about OBGYN.  Cough: She mentions that she has been having cough for 2 weeks and she has been having green spuum for last 2 weeks intermittently. She denies fevers, chills, sore throat, but mentions some headache, runny nose, and has allergies for which she takes cetirizine.  HTN medication: she has a confusion about which HTN medication she should be taking. Per the cardiologist's note on April 2016, She was started on metoprolol succinate, and aspirin. Both Prinzide and amiodarone were stopped. However, patient is still taking prinzide and also amiodarone and wonders if she should be taking it. However, her blood pressure today in the office is 134/80, which is well controlled.  OBGYN: Patients reports 3 weeks of heavy menses currently. She mentions that she was not sure what the recommendations of her obgyn were and she did not understand it when she went there. Per the obgyn note, she was put on Megace and OCPs were stopped. She was recommended to have endometrial biopsy. She is very hesitant to do any kind of surgery. So I explained that she should follow up with her and have the biopsy done, and after that she can decide about the surgery. I mentioned to her that without any kind of intervention, she will continue to have heavy periods as her u/s showed large uterine fibroids.  I also explained to her that she will need to continue her iron and b12 supplementation as she has h/o anemia.  [name] does not need any refills and is compliant with all her medicines.   Past Medical History  Diagnosis Date  . Diabetes  mellitus due to abnormal insulin   . Hypertension   . Congenital cerebral palsy   . Hearing impairment   . Schizoaffective disorder   . Depression   . Anemia   . Endometriosis    Current Outpatient Prescriptions on File Prior to Visit  Medication Sig Dispense Refill  . cetirizine (ZYRTEC) 10 MG tablet Take 30 mg by mouth daily.    . cyanocobalamin 1000 MCG tablet TAKE ONE (1) TABLET BY MOUTH EVERY DAY 90 tablet 1  . Estradiol Valerate-Dienogest (NATAZIA) 3/2-2/2-3/1 MG tablet Take 1 tablet by mouth daily. Start on first day of menstrual period 1 Package 1  . ferrous sulfate 325 (65 FE) MG tablet Take 1 tablet (325 mg total) by mouth 3 (three) times daily with meals. 90 tablet 3  . megestrol (MEGACE) 40 MG tablet Take 1 tablet (40 mg total) by mouth 3 (three) times daily. 90 tablet 3  . metFORMIN (GLUCOPHAGE) 500 MG tablet Take 1 tablet (500 mg total) by mouth 2 (two) times daily with a meal. 60 tablet 5  . metoprolol succinate (TOPROL-XL) 25 MG 24 hr tablet Take 25 mg by mouth daily.    . pravastatin (PRAVACHOL) 40 MG tablet TAKE ONE (1) TABLET BY MOUTH EVERY DAY 30 tablet 5  . senna-docusate (SENOKOT-S) 8.6-50 MG per tablet Take 1 tablet by mouth at bedtime as needed for mild constipation. 30 tablet 0  . venlafaxine XR (EFFEXOR-XR) 75 MG 24 hr capsule Take 75-150  mg by mouth 2 (two) times daily. TAKE ONE CAPSULE BY MOUTH IN THE MORNING AND TWO AT BEDTIME per patient     No current facility-administered medications on file prior to visit.   Family History  Problem Relation Age of Onset  . Heart failure Mother   . Hypertension Mother   . Diabetes Mellitus II Mother   . Cancer Mother   . Diabetes Mellitus II Father   . Hypertension Father    History   Social History  . Marital Status: Married    Spouse Name: N/A  . Number of Children: N/A  . Years of Education: N/A   Occupational History  . Not on file.   Social History Main Topics  . Smoking status: Never Smoker   .  Smokeless tobacco: Never Used  . Alcohol Use: No  . Drug Use: No  . Sexual Activity: Yes    Birth Control/ Protection: Pill   Other Topics Concern  . Not on file   Social History Narrative     Review of Systems  Constitutional: Positive for fatigue. Negative for fever, chills, appetite change and unexpected weight change.  HENT: Positive for congestion and sinus pressure. Negative for facial swelling, postnasal drip, sneezing and sore throat.   Respiratory: Positive for cough. Negative for chest tightness, shortness of breath and wheezing.   Cardiovascular: Negative.   Genitourinary: Positive for vaginal bleeding and menstrual problem.  OBGYN: uterine fibroids, and pt reports 3 weeks of heavy menses     Objective: BP 134/80 mmHg  Pulse 76  Temp(Src) 97.7 F (36.5 C) (Oral)  Ht 5\' 4"  (1.626 m)  Wt 199 lb 6.4 oz (90.447 kg)  BMI 34.21 kg/m2  SpO2 100% Body mass index is 34.21 kg/(m^2).    Physical Exam  Constitutional: She is oriented to person, place, and time. She appears well-developed and well-nourished.  HENT:  Head: Normocephalic and atraumatic.  Mouth/Throat: No oropharyngeal exudate.  Neck: Normal range of motion. Neck supple.  Cardiovascular: Normal rate and regular rhythm.   No murmur heard. Pulmonary/Chest: Effort normal and breath sounds normal. No respiratory distress. She has no wheezes.  Neurological: She is alert and oriented to person, place, and time.  Psychiatric: She has a normal mood and affect.      Assessment & Plan:   Please see problem based assessment and plan I spent approximately 25 minutes with this patient.

## 2015-05-21 NOTE — Assessment & Plan Note (Signed)
Patients reports 3 weeks of heavy menses currently. She mentions that she was not sure what the recommendations of her obgyn were and she did not understand it when she went there. Per the obgyn note, she was put on Megace and OCPs were stopped. She was recommended to have endometrial biopsy. She is very hesitant to do any kind of surgery. So I explained that she should follow up with her and have the biopsy done, and after that she can decide about the surgery. I mentioned to her that without any kind of intervention, she will continue to have heavy periods as her u/s showed large uterine fibroids about 44 mm in size. I also explained to her that she will need to continue her iron and b12 supplementation as she has h/o anemia.  Plan is to do CBC to check her hgb.

## 2015-05-21 NOTE — Patient Instructions (Signed)
Thank you for your visit today.  Blood pressure: Please continue the lisinopril-hydrochlorothiazide medicine  Please continue metoprolol Please STOP Amiodarone   Cough: Please use zyrtec. Please call and return to the clinic if you are running any fevers, of if your symptoms worsen or if you have productive cough in about 2 weeks or so.  OBGYN: We encourage you to follow up with your OB/gyn doctor for the endometrial biopsy We have ordered the CBC to check your blood count    General Instructions:   Please bring your medicines with you each time you come to clinic.  Medicines may include prescription medications, over-the-counter medications, herbal remedies, eye drops, vitamins, or other pills.   Progress Toward Treatment Goals:  Treatment Goal 03/13/2015  Hemoglobin A1C at goal  Blood pressure at goal    Self Care Goals & Plans:  Self Care Goal 03/13/2015  Manage my medications take my medicines as prescribed; bring my medications to every visit; refill my medications on time  Monitor my health keep track of my blood glucose; bring my glucose meter and log to each visit; check my feet daily  Eat healthy foods drink diet soda or water instead of juice or soda; eat more vegetables; eat foods that are low in salt; eat baked foods instead of fried foods; eat smaller portions  Be physically active find an activity I enjoy; take a walk every day  Meeting treatment goals -    Home Blood Glucose Monitoring 03/13/2015  Check my blood sugar once a day  When to check my blood sugar before breakfast     Care Management & Community Referrals:  Referral 07/30/2014  Referrals made for care management support none needed

## 2015-05-21 NOTE — Assessment & Plan Note (Signed)
BP Readings from Last 3 Encounters:  05/21/15 134/80  04/07/15 140/92  03/13/15 141/77    Lab Results  Component Value Date   NA 142 03/06/2015   K 3.9 03/06/2015   CREATININE 0.67 03/06/2015    Assessment &Plan: Blood pressure is well controlled. I reiterated her to stop the amiodarone per the cardiologist's note. I recommended her to continue lisinopril-hctz 10-12.5 as her BP is 134/80 despite being on it. Continue metoprolol succinate er  Continue aspirin

## 2015-05-21 NOTE — Assessment & Plan Note (Signed)
Cough: for last 2 weeks with intermittent sputum production  Most likely viral or allergic in etiology. She does not have any fevers, pharyngeal erythema, sore throat, or SOB. Lungs are CTAB.  I explained to her that right now, there is no need for antibiotics. - Symptomatic management- including flonase spray, zyrtec, steam inhalation, and lozenges.  RTC if symptoms worsen

## 2015-05-22 NOTE — Progress Notes (Signed)
Internal Medicine Clinic Attending  I saw and evaluated the patient.  I personally confirmed the key portions of the history and exam documented by Dr. Saraiya and I reviewed pertinent patient test results.  The assessment, diagnosis, and plan were formulated together and I agree with the documentation in the resident's note.  

## 2015-06-09 DIAGNOSIS — Z79899 Other long term (current) drug therapy: Secondary | ICD-10-CM | POA: Diagnosis not present

## 2015-06-09 DIAGNOSIS — I10 Essential (primary) hypertension: Secondary | ICD-10-CM | POA: Diagnosis not present

## 2015-06-09 DIAGNOSIS — E780 Pure hypercholesterolemia: Secondary | ICD-10-CM | POA: Diagnosis not present

## 2015-06-09 DIAGNOSIS — R112 Nausea with vomiting, unspecified: Secondary | ICD-10-CM | POA: Diagnosis not present

## 2015-06-09 DIAGNOSIS — R079 Chest pain, unspecified: Secondary | ICD-10-CM | POA: Diagnosis not present

## 2015-06-09 DIAGNOSIS — R51 Headache: Secondary | ICD-10-CM | POA: Diagnosis not present

## 2015-06-18 ENCOUNTER — Other Ambulatory Visit: Payer: Self-pay | Admitting: Obstetrics & Gynecology

## 2015-06-21 ENCOUNTER — Other Ambulatory Visit: Payer: Self-pay | Admitting: Internal Medicine

## 2015-06-23 MED ORDER — METFORMIN HCL 500 MG PO TABS: 500.0000 mg | ORAL_TABLET | Freq: Two times a day (BID) | ORAL | Status: DC

## 2015-06-23 NOTE — Telephone Encounter (Signed)
Pls sch Oct, Nov, Dec appt with PCP

## 2015-07-03 ENCOUNTER — Ambulatory Visit (INDEPENDENT_AMBULATORY_CARE_PROVIDER_SITE_OTHER): Payer: Medicare Other | Admitting: Obstetrics & Gynecology

## 2015-07-03 ENCOUNTER — Encounter: Payer: Self-pay | Admitting: Obstetrics & Gynecology

## 2015-07-03 ENCOUNTER — Other Ambulatory Visit (HOSPITAL_COMMUNITY)
Admission: RE | Admit: 2015-07-03 | Discharge: 2015-07-03 | Disposition: A | Discharge status: Home / Self Care | Payer: Medicare Other | Source: Ambulatory Visit | Attending: Obstetrics & Gynecology | Admitting: Obstetrics & Gynecology

## 2015-07-03 VITALS — BP 126/88 | HR 84 | Temp 98.0°F | Ht 64.0 in | Wt 196.9 lb

## 2015-07-03 DIAGNOSIS — Z3202 Encounter for pregnancy test, result negative: Secondary | ICD-10-CM | POA: Diagnosis not present

## 2015-07-03 DIAGNOSIS — N939 Abnormal uterine and vaginal bleeding, unspecified: Secondary | ICD-10-CM | POA: Diagnosis not present

## 2015-07-03 DIAGNOSIS — Z01818 Encounter for other preprocedural examination: Secondary | ICD-10-CM | POA: Diagnosis not present

## 2015-07-03 DIAGNOSIS — D259 Leiomyoma of uterus, unspecified: Secondary | ICD-10-CM | POA: Diagnosis present

## 2015-07-03 LAB — POCT PREGNANCY, URINE: PREG TEST UR: NEGATIVE

## 2015-07-03 NOTE — Patient Instructions (Signed)
Total Laparoscopic Hysterectomy A total laparoscopic hysterectomy is a minimally invasive surgery to remove your uterus and cervix. This surgery is performed by making several small cuts (incisions) in your abdomen. It can also be done with a thin, lighted tube (laparoscope) inserted into two small incisions in your lower abdomen. Your fallopian tubes and ovaries can be removed (bilateral salpingo-oophorectomy) during this surgery as well.Benefits of minimally invasive surgery include:  Less pain.  Less risk of blood loss.  Less risk of infection.  Quicker return to normal activities. LET [facility] CARE PROVIDER KNOW ABOUT:  Any allergies you have.  All medicines you are taking, including vitamins, herbs, eye drops, creams, and over-the-counter medicines.  Previous problems you or members of your family have had with the use of anesthetics.  Any blood disorders you have.  Previous surgeries you have had.  Medical conditions you have. RISKS AND COMPLICATIONS  Generally, this is a safe procedure. However, as with any procedure, complications can occur. Possible complications include:  Bleeding.  Blood clots in the legs or lung.  Infection.  Injury to surrounding organs.  Problems with anesthesia.  Early menopause symptoms (hot flashes, night sweats, insomnia).  Risk of conversion to an open abdominal incision. BEFORE THE PROCEDURE  Ask your health care provider about changing or stopping your regular medicines.  Do not take aspirin or blood thinners (anticoagulants) for 1 week before the surgery or as told by your health care provider.  Do not eat or drink anything for 8 hours before the surgery or as told by your health care provider.  Quit smoking if you smoke.  Arrange for a ride home after surgery and for someone to help you at home during recovery. PROCEDURE   You will be given antibiotic medicine.  An IV tube will be placed in your arm. You will be given  medicine to make you sleep (general anesthetic).  A gas (carbon dioxide) will be used to inflate your abdomen. This will allow your surgeon to look inside your abdomen, perform your surgery, and treat any other problems found if necessary.  Three or four small incisions (often less than 1/2 inch) will be made in your abdomen. One of these incisions will be made in the area of your belly button (navel). The laparoscope will be inserted into the incision. Your surgeon will look through the laparoscope while doing your procedure.  Other surgical instruments will be inserted through the other incisions.  Your uterus may be removed through your vagina or cut into small pieces and removed through the small incisions.  Your incisions will be closed. AFTER THE PROCEDURE  The gas will be released from inside your abdomen.  You will be taken to the recovery area where a nurse will watch and check your progress. Once you are awake, stable, and taking fluids well, without other problems, you will return to your room or be allowed to go home.  There is usually minimal discomfort following the surgery because the incisions are so small.  You will be given pain medicine while you are in the hospital and for when you go home. Document Released: 08/29/2007 Document Revised: 07/04/2013 Document Reviewed: 05/22/2013 [facility] Patient Information 2015 [place], [place]. This information is not intended to replace advice given to you by your health care provider. Make sure you discuss any questions you have with your health care provider. Uterine Artery Embolization for Fibroids Uterine artery embolization is a nonsurgical treatment to shrink fibroids. A thin plastic tube (catheter)  is used to inject material that blocks off the blood supply to the fibroid, which causes the fibroid to shrink. LET [facility] CARE PROVIDER KNOW ABOUT:  Any allergies you have.  All medicines you are taking, including vitamins,  herbs, eye drops, creams, and over-the-counter medicines.  Previous problems you or members of your family have had with the use of anesthetics.  Any blood disorders you have.  Previous surgeries you have had.  Medical conditions you have. RISKS AND COMPLICATIONS  Injury to the uterus from decreased blood supply  Infection.  Blood infection (septicemia).  Lack of menstrual periods (amenorrhea).  Death of tissue cells (necrosis) around your bladder or vulva.  Development of a hole between organs or from an organ to the surface of your skin (fistula).  Blood clot in the legs (deep vein thrombosis) or lung (pulmonary embolus). BEFORE THE PROCEDURE  Ask your health care provider about changing or stopping your regular medicines.   Do not take aspirin or blood thinners (anticoagulants) for 1 week before the surgery or as directed by your health care provider.  Do not eat or drink anything for 8 hours before the surgery or as directed by your health care provider.   Empty your bladder before the procedure begins. PROCEDURE   An IV tube will be placed into one of your veins. This will be used to give you a sedative and pain medication (conscious sedation).  You will be given a medicine that numbs the area (local anesthetic).  A small cut will be made in your groin. A catheter is then inserted into the main artery of your leg.  The catheter will be guided through the artery to your uterus. A series of images will be taken while dye is injected through the catheter in your groin. X-rays are taken at the same time. This is done to provide a road map of the blood supply to your uterus and fibroids.  Tiny plastic spheres, about the size of sand grains, will be injected through the catheter. Metal coils may be used to help block the artery. The particles will lodge in tiny branches of the uterine artery that supplies blood to the fibroids.  The procedure is repeated on the artery  that supplies the other side of the uterus.  The catheter is then removed and pressure is held to stop any bleeding. No stitches are needed.  A dressing is then placed over the cut (incision). AFTER THE PROCEDURE  You will be taken to a recovery area where your progress will be monitored until you are awake, stable, and taking fluids well. If there are no other problems, you will then be moved to a regular hospital room.  You will be observed overnight in the hospital.  You will have cramping that should be controlled with pain medication. Document Released: 01/17/2006 Document Revised: 08/22/2013 Document Reviewed: 05/17/2013 [facility] Patient Information 2015 [place], [place]. This information is not intended to replace advice given to you by your health care provider. Make sure you discuss any questions you have with your health care provider. Myomectomy Myomectomy is surgery to remove a noncancerous tumor (myoma) from the uterus. Myomas are tumors made up of fibrous tissue. They are often called fibroid tumors. Fibroid tumors can range from the size of a pea to the size of a grapefruit. In a myomectomy, the fibroid tumor is removed without removing the uterus. Because these tumors are rarely cancerous, this surgery is usually done only if the tumor is  growing or causing symptoms such as pain, pressure, bleeding, or pain with intercourse. LET [facility] CARE PROVIDER KNOW ABOUT:  Any allergies you have.  All medicines you are taking, including vitamins, herbs, eye drops, creams, and over-the-counter medicines.  Previous problems you or members of your family have had with the use of anesthetics.  Any blood disorders you have.  Previous surgeries you have had.  Medical conditions you have. RISKS AND COMPLICATIONS  Generally, this is a safe procedure. However, as with any procedure, complications can occur. Possible complications include:  Excessive bleeding.  Infection.  Injury  to nearby organs.  Blood clots in the legs, chest, or brain.  Scar tissue on other organs and in the pelvis. This may require another surgery to remove the scar tissue. BEFORE THE PROCEDURE  Ask your health care provider about changing or stopping your regular medicines. Avoid taking aspirin or blood thinners as directed by your health care provider.  Do not  eat or drink anything after midnight on the night before surgery.  If you smoke, do not  smoke for 2 weeks before the surgery.  Do not  drink alcohol the day before the surgery.  Arrange for someone to drive you home after the procedure or after your hospital stay. Also arrange for someone to help you with activities during your recovery. PROCEDURE You will be given medicine to make you sleep through the procedure (general anesthetic). Any of the following methods may be used to perform a myomectomy:  Small monitors will be put on your body. They are used to check your heart, blood pressure, and oxygen level.  An IV access tube will be put into one of your veins. Medicine will be able to flow directly into your body through this IV tube.  You might be given a medicine to help you relax (sedative).  You will be given a medicine to make you sleep (general anesthetic). A breathing tube will be placed into your lungs during the procedure.  A thin, flexible tube (catheter) will be inserted into your bladder to collect urine.  Any of the following methods may be used to perform a myomectomy:  Hysteroscopic myomectomy--This method may be used when the fibroid tumor is inside the cavity of the uterus. A long, thin tube that is like a telescope (hysteroscope) is inserted inside the uterus. A saline solution is put into your uterus. This expands the uterus and allows the surgeon to see the fibroids. Tools are passed through the hysteroscope to remove the fibroid tumor in pieces.  Laparoscopic myomectomy--A few small cuts (incisions) are  made in the lower abdomen. A thin, lighted tube with a tiny camera on the end (laparoscope) is inserted through one of the incisions. This gives the surgeon a good view of the area. The fibroid tumor is removed through the other incisions. The incisions are then closed with stitches (sutures) or staples.  Abdominal myomectomy--This method is used when the fibroid tumor cannot be removed with a hysteroscope or laparoscope. The surgery is performed through a larger surgical incision in the abdomen. The fibroid tumor is removed through this incision. The incision is closed with sutures or staples. AFTER THE PROCEDURE  If you had a laparoscopic or hysteroscopic myomectomy, you may be able to go home the same day, or you may need to stay in the hospital overnight.  If you had an abdominal myomectomy, you may need to stay in the hospital for a few days.  Your IV access  tube and catheter will be removed in 1-2 days.  You may be given medicine for pain or to help you sleep.  You may be given an antibiotic medicine, if needed. Document Released: 08/29/2007 Document Revised: 08/22/2013 Document Reviewed: 06/13/2013 [facility] Patient Information 2015 [place], [place]. This information is not intended to replace advice given to you by your health care provider. Make sure you discuss any questions you have with your health care provider.

## 2015-07-03 NOTE — Progress Notes (Signed)
Patient ID: [name], female   DOB: 1971/06/30, 44 y.o.   MRN: [id number] The indications for endometrial biopsy were reviewed.   Risks of the biopsy including cramping, bleeding, infection, uterine perforation, inadequate specimen and need for additional procedures  were discussed. The patient states she understands and agrees to undergo procedure today. Consent was signed. Time out was performed. Urine HCG was negative. A sterile speculum was placed in the patient's vagina and the cervix was prepped with Betadine. A single-toothed tenaculum was placed on the anterior lip of the cervix to stabilize it. The 3 mm pipelle was introduced into the endometrial cavity without difficulty to a depth of 9cm, there were fibroids palpated due  and a moderate amount of tissue was obtained and sent to pathology. The instruments were removed from the patient's vagina. Minimal bleeding from the cervix was noted. The patient tolerated the procedure well. Routine post-procedure instructions were given to the patient.   Pt request surgical treatment of fibroids and bleeding.  I discussed with her myomectomy vs RATH vs [place].  Pt waivered several times in the room between procedures but, wanted to have a procedure scheduled today.  I told her that I would not schedule her because she needs more time to think about it and discuss this with her family.  She was also assigned EMMI videos to watch of all of the above procedures.   She was told that she could call back after reviewing the videos to inform us of her decision. The pts mother was present for the entire discussion.   Layson Bertsch L. Harraway-Smith, M.D., Cherlynn June

## 2015-07-07 ENCOUNTER — Encounter: Payer: Self-pay | Admitting: Obstetrics & Gynecology

## 2015-07-09 ENCOUNTER — Telehealth: Payer: Self-pay

## 2015-07-09 NOTE — Telephone Encounter (Deleted)
  Pt called and stated that Dr. Ihor Dow told her to call when she has decided on what surgery she wants; can you please have her call me.

## 2015-07-09 NOTE — Telephone Encounter (Addendum)
Called pt and clarified with pt that she left message stating that she chose a myomectomy.  Pt stated "yes, that is the surgery that I have choosen".  I informed pt that I have let Dr. Ihor Dow know and that she should expecting a call from the surgery scheduler concerning surgery date.  Pt stated understanding with no further questions.

## 2015-07-09 NOTE — Progress Notes (Signed)
Patient ID: [name], female   DOB: 07/01/1971, 44 y.o.   MRN: [id number] Patient desires surgical management with myomectomy. The risks of surgery were discussed in detail with the patient at her last visit including but not limited to: bleeding which may require transfusion or reoperation; infection which may require prolonged hospitalization or re-hospitalization and antibiotic therapy; injury to bowel, bladder, ureters and major vessels or other surrounding organs; need for additional procedures including laparotomy; thromboembolic phenomenon, incisional problems and other postoperative or anesthesia complications. Patient was told that the likelihood that her condition and symptoms will be treated effectively with this surgical management was very high; the postoperative expectations were also discussed in detail. The patient also understands the alternative treatment options which were discussed in full. All questions were answered. She was told that she will be contacted by our surgical scheduler regarding the time and date of her surgery; routine preoperative instructions of having nothing to eat or drink after midnight on the day prior to surgery and also coming to the hospital 1 1/2 hours prior to her time of surgery were also emphasized. She was told she may be called for a preoperative appointment about a week prior to surgery and will be given further preoperative instructions at that visit. Printed patient education handouts about the procedure were given to the patient to review at home.  [name] L. Harraway-Smith, M.D., Cherlynn June

## 2015-07-09 NOTE — Telephone Encounter (Signed)
Pt called and stated that Dr. Ihor Dow told her to call when she has decided on what surgery she wants; can you please have her call me.   Message forwarded to Dr. Ihor Dow.

## 2015-07-23 DIAGNOSIS — F319 Bipolar disorder, unspecified: Secondary | ICD-10-CM | POA: Diagnosis not present

## 2015-07-30 ENCOUNTER — Encounter (HOSPITAL_COMMUNITY): Payer: Self-pay

## 2015-07-30 ENCOUNTER — Encounter (HOSPITAL_COMMUNITY)
Admission: RE | Admit: 2015-07-30 | Discharge: 2015-07-30 | Disposition: A | Discharge status: Home / Self Care | Payer: Medicare Other | Source: Ambulatory Visit | Attending: Obstetrics & Gynecology | Admitting: Obstetrics & Gynecology

## 2015-07-30 DIAGNOSIS — D259 Leiomyoma of uterus, unspecified: Secondary | ICD-10-CM | POA: Insufficient documentation

## 2015-07-30 DIAGNOSIS — Z01818 Encounter for other preprocedural examination: Secondary | ICD-10-CM | POA: Insufficient documentation

## 2015-07-30 DIAGNOSIS — N939 Abnormal uterine and vaginal bleeding, unspecified: Secondary | ICD-10-CM | POA: Insufficient documentation

## 2015-07-30 HISTORY — DX: Myoneural disorder, unspecified: G70.9

## 2015-07-30 HISTORY — DX: Cardiac murmur, unspecified: R01.1

## 2015-07-30 HISTORY — DX: Schizoaffective disorder, unspecified: F25.9

## 2015-07-30 LAB — CBC
HEMATOCRIT: 34.5 % — AB (ref 36.0–46.0)
HEMOGLOBIN: 11.5 g/dL — AB (ref 12.0–15.0)
MCH: 29.3 pg (ref 26.0–34.0)
MCHC: 33.3 g/dL (ref 30.0–36.0)
MCV: 88 fL (ref 78.0–100.0)
Platelets: 266 10*3/uL (ref 150–400)
RBC: 3.92 MIL/uL (ref 3.87–5.11)
RDW: 15.6 % — ABNORMAL HIGH (ref 11.5–15.5)
WBC: 5.2 10*3/uL (ref 4.0–10.5)

## 2015-07-30 LAB — TYPE AND SCREEN
ABO/RH(D): O NEG
ANTIBODY SCREEN: NEGATIVE

## 2015-07-30 LAB — BASIC METABOLIC PANEL
Anion gap: 8 (ref 5–15)
BUN: 8 mg/dL (ref 6–20)
CALCIUM: 9.5 mg/dL (ref 8.9–10.3)
CO2: 31 mmol/L (ref 22–32)
CREATININE: 0.7 mg/dL (ref 0.44–1.00)
Chloride: 100 mmol/L — ABNORMAL LOW (ref 101–111)
Glucose, Bld: 107 mg/dL — ABNORMAL HIGH (ref 65–99)
Potassium: 3.6 mmol/L (ref 3.5–5.1)
SODIUM: 139 mmol/L (ref 135–145)

## 2015-07-30 LAB — ABO/RH: ABO/RH(D): O NEG

## 2015-07-30 NOTE — Pre-Procedure Instructions (Signed)
Patient's chart is flagged for " Blood products refusal" but she stated that she will accept blood if her life is endangered but doesn't want her mother to know if she has to receive blood.

## 2015-07-30 NOTE — Patient Instructions (Addendum)
Your procedure is scheduled on:08/05/15  Enter through the Main Entrance at :12:15 pm Pick up desk phone and dial [phone] and inform us of your arrival.  Please call [phone] if you have any problems the morning of surgery.  Remember: Do not eat food after midnight: Monday Clear liquids are ok until: 9am Tuesday   You may brush your teeth the morning of surgery.  Take these meds the morning of surgery with a sip of water: BP meds, Effexor and Pravastatin HOLD METFORMIN FOR 24 HOURS PRIOR TO SURGERY.  DO NOT wear jewelry, eye make-up, lipstick,body lotion, or dark fingernail polish.  (Polished toes are ok) You may wear deodorant.  If you are to be admitted after surgery, leave suitcase in car until your room has been assigned. Patients discharged on the day of surgery will not be allowed to drive home. Wear loose fitting, comfortable clothes for your ride home.

## 2015-08-04 MED ORDER — DEXTROSE 5 % IV SOLN
2.0000 g | INTRAVENOUS | Status: AC
  Administered 2015-08-05: 2 g via INTRAVENOUS
  Filled 2015-08-04: qty 2

## 2015-08-05 ENCOUNTER — Encounter (HOSPITAL_COMMUNITY): Payer: Self-pay | Admitting: Obstetrics & Gynecology

## 2015-08-05 ENCOUNTER — Ambulatory Visit (HOSPITAL_COMMUNITY): Payer: Medicare Other | Admitting: Anesthesiology

## 2015-08-05 ENCOUNTER — Inpatient Hospital Stay (HOSPITAL_COMMUNITY)
Admission: AD | Admit: 2015-08-05 | Discharge: 2015-08-07 | DRG: 743 | Disposition: A | Discharge status: Home / Self Care | Payer: Medicare Other | Source: Ambulatory Visit | Attending: Obstetrics & Gynecology | Admitting: Obstetrics & Gynecology

## 2015-08-05 ENCOUNTER — Encounter (HOSPITAL_COMMUNITY)
Admission: AD | Disposition: A | Discharge status: Home / Self Care | Payer: Self-pay | Source: Ambulatory Visit | Attending: Obstetrics & Gynecology

## 2015-08-05 DIAGNOSIS — E785 Hyperlipidemia, unspecified: Secondary | ICD-10-CM | POA: Diagnosis present

## 2015-08-05 DIAGNOSIS — Z79899 Other long term (current) drug therapy: Secondary | ICD-10-CM

## 2015-08-05 DIAGNOSIS — F329 Major depressive disorder, single episode, unspecified: Secondary | ICD-10-CM | POA: Diagnosis present

## 2015-08-05 DIAGNOSIS — J302 Other seasonal allergic rhinitis: Secondary | ICD-10-CM | POA: Diagnosis present

## 2015-08-05 DIAGNOSIS — N6019 Diffuse cystic mastopathy of unspecified breast: Secondary | ICD-10-CM | POA: Diagnosis present

## 2015-08-05 DIAGNOSIS — Z833 Family history of diabetes mellitus: Secondary | ICD-10-CM

## 2015-08-05 DIAGNOSIS — R011 Cardiac murmur, unspecified: Secondary | ICD-10-CM | POA: Diagnosis present

## 2015-08-05 DIAGNOSIS — N800 Endometriosis of uterus: Secondary | ICD-10-CM | POA: Diagnosis present

## 2015-08-05 DIAGNOSIS — D252 Subserosal leiomyoma of uterus: Principal | ICD-10-CM | POA: Diagnosis present

## 2015-08-05 DIAGNOSIS — Z91041 Radiographic dye allergy status: Secondary | ICD-10-CM

## 2015-08-05 DIAGNOSIS — Z9079 Acquired absence of other genital organ(s): Secondary | ICD-10-CM

## 2015-08-05 DIAGNOSIS — Z90710 Acquired absence of both cervix and uterus: Secondary | ICD-10-CM | POA: Diagnosis present

## 2015-08-05 DIAGNOSIS — D649 Anemia, unspecified: Secondary | ICD-10-CM | POA: Diagnosis present

## 2015-08-05 DIAGNOSIS — I10 Essential (primary) hypertension: Secondary | ICD-10-CM | POA: Diagnosis not present

## 2015-08-05 DIAGNOSIS — H9190 Unspecified hearing loss, unspecified ear: Secondary | ICD-10-CM | POA: Diagnosis present

## 2015-08-05 DIAGNOSIS — N939 Abnormal uterine and vaginal bleeding, unspecified: Secondary | ICD-10-CM | POA: Diagnosis not present

## 2015-08-05 DIAGNOSIS — D259 Leiomyoma of uterus, unspecified: Secondary | ICD-10-CM | POA: Diagnosis present

## 2015-08-05 DIAGNOSIS — Z8249 Family history of ischemic heart disease and other diseases of the circulatory system: Secondary | ICD-10-CM

## 2015-08-05 DIAGNOSIS — Z90722 Acquired absence of ovaries, bilateral: Secondary | ICD-10-CM

## 2015-08-05 DIAGNOSIS — F259 Schizoaffective disorder, unspecified: Secondary | ICD-10-CM | POA: Diagnosis present

## 2015-08-05 DIAGNOSIS — Z6835 Body mass index (BMI) 35.0-35.9, adult: Secondary | ICD-10-CM | POA: Diagnosis not present

## 2015-08-05 DIAGNOSIS — Z91048 Other nonmedicinal substance allergy status: Secondary | ICD-10-CM

## 2015-08-05 DIAGNOSIS — E041 Nontoxic single thyroid nodule: Secondary | ICD-10-CM | POA: Diagnosis present

## 2015-08-05 DIAGNOSIS — Z23 Encounter for immunization: Secondary | ICD-10-CM

## 2015-08-05 DIAGNOSIS — F819 Developmental disorder of scholastic skills, unspecified: Secondary | ICD-10-CM | POA: Diagnosis present

## 2015-08-05 DIAGNOSIS — D500 Iron deficiency anemia secondary to blood loss (chronic): Secondary | ICD-10-CM | POA: Diagnosis present

## 2015-08-05 DIAGNOSIS — N938 Other specified abnormal uterine and vaginal bleeding: Secondary | ICD-10-CM | POA: Diagnosis present

## 2015-08-05 DIAGNOSIS — N809 Endometriosis, unspecified: Secondary | ICD-10-CM | POA: Diagnosis not present

## 2015-08-05 DIAGNOSIS — G809 Cerebral palsy, unspecified: Secondary | ICD-10-CM | POA: Diagnosis present

## 2015-08-05 DIAGNOSIS — E119 Type 2 diabetes mellitus without complications: Secondary | ICD-10-CM | POA: Diagnosis present

## 2015-08-05 HISTORY — PX: MYOMECTOMY: SHX85

## 2015-08-05 LAB — GLUCOSE, CAPILLARY
GLUCOSE-CAPILLARY: 95 mg/dL (ref 65–99)
Glucose-Capillary: 134 mg/dL — ABNORMAL HIGH (ref 65–99)
Glucose-Capillary: 139 mg/dL — ABNORMAL HIGH (ref 65–99)

## 2015-08-05 LAB — PREGNANCY, URINE: Preg Test, Ur: NEGATIVE

## 2015-08-05 SURGERY — MYOMECTOMY, ABDOMINAL APPROACH
Anesthesia: General | Site: Abdomen

## 2015-08-05 MED ORDER — HYDROMORPHONE HCL 1 MG/ML IJ SOLN
INTRAMUSCULAR | Status: DC | PRN
  Administered 2015-08-05 (×2): 0.5 mg via INTRAVENOUS

## 2015-08-05 MED ORDER — NALOXONE HCL 0.4 MG/ML IJ SOLN: 0.4000 mg | INTRAMUSCULAR | Status: DC | PRN

## 2015-08-05 MED ORDER — ROCURONIUM BROMIDE 100 MG/10ML IV SOLN
INTRAVENOUS | Status: DC | PRN
  Administered 2015-08-05: 50 mg via INTRAVENOUS

## 2015-08-05 MED ORDER — KETOROLAC TROMETHAMINE 30 MG/ML IJ SOLN
INTRAMUSCULAR | Status: AC
Start: 2015-08-05 — End: 2015-08-05
  Filled 2015-08-05: qty 1

## 2015-08-05 MED ORDER — HYDROMORPHONE HCL 1 MG/ML IJ SOLN: 0.2000 mg | INTRAMUSCULAR | Status: DC | PRN

## 2015-08-05 MED ORDER — GLYCOPYRROLATE 0.2 MG/ML IJ SOLN
INTRAMUSCULAR | Status: AC
  Filled 2015-08-05: qty 3

## 2015-08-05 MED ORDER — ONDANSETRON HCL 4 MG/2ML IJ SOLN
INTRAMUSCULAR | Status: DC | PRN
  Administered 2015-08-05: 4 mg via INTRAVENOUS

## 2015-08-05 MED ORDER — SODIUM CHLORIDE 0.9 % IJ SOLN
INTRAMUSCULAR | Status: AC
  Filled 2015-08-05: qty 50

## 2015-08-05 MED ORDER — PROPOFOL 10 MG/ML IV BOLUS
INTRAVENOUS | Status: DC | PRN
  Administered 2015-08-05: 100 mg via INTRAVENOUS
  Administered 2015-08-05: 200 mg via INTRAVENOUS

## 2015-08-05 MED ORDER — ONDANSETRON HCL 4 MG PO TABS: 4.0000 mg | ORAL_TABLET | Freq: Four times a day (QID) | ORAL | Status: DC | PRN

## 2015-08-05 MED ORDER — FERROUS SULFATE 325 (65 FE) MG PO TABS
325.0000 mg | ORAL_TABLET | Freq: Three times a day (TID) | ORAL | Status: DC
  Administered 2015-08-06 – 2015-08-07 (×4): 325 mg via ORAL
  Filled 2015-08-05 (×5): qty 1

## 2015-08-05 MED ORDER — EPHEDRINE 5 MG/ML INJ
INTRAVENOUS | Status: AC
  Filled 2015-08-05: qty 10

## 2015-08-05 MED ORDER — VITAMIN B-12 1000 MCG PO TABS
1000.0000 ug | ORAL_TABLET | Freq: Every day | ORAL | Status: DC
  Administered 2015-08-07: 1000 ug via ORAL
  Filled 2015-08-05 (×2): qty 1

## 2015-08-05 MED ORDER — PRAVASTATIN SODIUM 40 MG PO TABS
40.0000 mg | ORAL_TABLET | Freq: Every day | ORAL | Status: DC
  Administered 2015-08-05 – 2015-08-06 (×2): 40 mg via ORAL
  Filled 2015-08-05 (×2): qty 1

## 2015-08-05 MED ORDER — BUPIVACAINE HCL (PF) 0.5 % IJ SOLN
INTRAMUSCULAR | Status: AC
  Filled 2015-08-05: qty 30

## 2015-08-05 MED ORDER — ONDANSETRON HCL 4 MG/2ML IJ SOLN: 4.0000 mg | Freq: Four times a day (QID) | INTRAMUSCULAR | Status: DC | PRN

## 2015-08-05 MED ORDER — LACTATED RINGERS IV SOLN
INTRAVENOUS | Status: DC
  Administered 2015-08-05 – 2015-08-06 (×2): via INTRAVENOUS

## 2015-08-05 MED ORDER — ONDANSETRON HCL 4 MG/2ML IJ SOLN
INTRAMUSCULAR | Status: AC
  Filled 2015-08-05: qty 2

## 2015-08-05 MED ORDER — OXYCODONE-ACETAMINOPHEN 5-325 MG PO TABS
1.0000 | ORAL_TABLET | ORAL | Status: DC | PRN
  Administered 2015-08-06 – 2015-08-07 (×3): 1 via ORAL
  Filled 2015-08-05 (×4): qty 1

## 2015-08-05 MED ORDER — PANTOPRAZOLE SODIUM 40 MG PO TBEC
40.0000 mg | DELAYED_RELEASE_TABLET | Freq: Every day | ORAL | Status: DC
  Administered 2015-08-06 – 2015-08-07 (×2): 40 mg via ORAL
  Filled 2015-08-05 (×2): qty 1

## 2015-08-05 MED ORDER — KETOROLAC TROMETHAMINE 30 MG/ML IJ SOLN
30.0000 mg | Freq: Once | INTRAMUSCULAR | Status: AC
  Administered 2015-08-05: 30 mg via INTRAVENOUS
  Filled 2015-08-05: qty 1

## 2015-08-05 MED ORDER — LIDOCAINE HCL (CARDIAC) 20 MG/ML IV SOLN
INTRAVENOUS | Status: DC | PRN
  Administered 2015-08-05: 40 mg via INTRAVENOUS

## 2015-08-05 MED ORDER — LACTATED RINGERS IV SOLN
INTRAVENOUS | Status: DC
  Administered 2015-08-05 (×3): via INTRAVENOUS

## 2015-08-05 MED ORDER — LIDOCAINE HCL (CARDIAC) 20 MG/ML IV SOLN
INTRAVENOUS | Status: AC
  Filled 2015-08-05: qty 5

## 2015-08-05 MED ORDER — GLYCOPYRROLATE 0.2 MG/ML IJ SOLN
INTRAMUSCULAR | Status: DC | PRN
  Administered 2015-08-05: .8 mg via INTRAVENOUS

## 2015-08-05 MED ORDER — MIDAZOLAM HCL 2 MG/2ML IJ SOLN
INTRAMUSCULAR | Status: DC | PRN
  Administered 2015-08-05: 2 mg via INTRAVENOUS

## 2015-08-05 MED ORDER — VENLAFAXINE HCL ER 150 MG PO CP24
150.0000 mg | ORAL_CAPSULE | Freq: Every day | ORAL | Status: DC
  Administered 2015-08-05 – 2015-08-06 (×2): 150 mg via ORAL
  Filled 2015-08-05 (×2): qty 1

## 2015-08-05 MED ORDER — DEXAMETHASONE SODIUM PHOSPHATE 10 MG/ML IJ SOLN
INTRAMUSCULAR | Status: AC
  Filled 2015-08-05: qty 1

## 2015-08-05 MED ORDER — DEXAMETHASONE SODIUM PHOSPHATE 10 MG/ML IJ SOLN
INTRAMUSCULAR | Status: DC | PRN
  Administered 2015-08-05: 4 mg via INTRAVENOUS

## 2015-08-05 MED ORDER — HYDROCHLOROTHIAZIDE 12.5 MG PO CAPS
12.5000 mg | ORAL_CAPSULE | Freq: Every day | ORAL | Status: DC
  Administered 2015-08-06: 12.5 mg via ORAL
  Filled 2015-08-05 (×2): qty 1

## 2015-08-05 MED ORDER — KETOROLAC TROMETHAMINE 30 MG/ML IJ SOLN
INTRAMUSCULAR | Status: DC | PRN
  Administered 2015-08-05: 30 mg via INTRAVENOUS

## 2015-08-05 MED ORDER — LISINOPRIL-HYDROCHLOROTHIAZIDE 10-12.5 MG PO TABS: 1.0000 | ORAL_TABLET | Freq: Every day | ORAL | Status: DC

## 2015-08-05 MED ORDER — LISINOPRIL 10 MG PO TABS
10.0000 mg | ORAL_TABLET | Freq: Every day | ORAL | Status: DC
  Administered 2015-08-06: 10 mg via ORAL
  Filled 2015-08-05 (×2): qty 1

## 2015-08-05 MED ORDER — FENTANYL CITRATE (PF) 100 MCG/2ML IJ SOLN
INTRAMUSCULAR | Status: AC
  Filled 2015-08-05: qty 2

## 2015-08-05 MED ORDER — DIPHENHYDRAMINE HCL 12.5 MG/5ML PO ELIX: 12.5000 mg | ORAL_SOLUTION | Freq: Four times a day (QID) | ORAL | Status: DC | PRN

## 2015-08-05 MED ORDER — EPHEDRINE SULFATE 50 MG/ML IJ SOLN
INTRAMUSCULAR | Status: DC | PRN
  Administered 2015-08-05: 5 mg via INTRAVENOUS
  Administered 2015-08-05: 10 mg via INTRAVENOUS

## 2015-08-05 MED ORDER — DIPHENHYDRAMINE HCL 50 MG/ML IJ SOLN: 12.5000 mg | Freq: Four times a day (QID) | INTRAMUSCULAR | Status: DC | PRN

## 2015-08-05 MED ORDER — PROPOFOL 10 MG/ML IV BOLUS
INTRAVENOUS | Status: AC
  Filled 2015-08-05: qty 20

## 2015-08-05 MED ORDER — HYDROMORPHONE HCL 1 MG/ML IJ SOLN
INTRAMUSCULAR | Status: AC
  Filled 2015-08-05: qty 1

## 2015-08-05 MED ORDER — SODIUM CHLORIDE 0.9 % IJ SOLN: 9.0000 mL | INTRAMUSCULAR | Status: DC | PRN

## 2015-08-05 MED ORDER — HYDROMORPHONE 0.3 MG/ML IV SOLN
INTRAVENOUS | Status: DC
  Administered 2015-08-05: 15:00:00 via INTRAVENOUS
  Administered 2015-08-05: 1.3 mg via INTRAVENOUS
  Administered 2015-08-06: 7 mg via INTRAVENOUS
  Administered 2015-08-06: 1.2 mg via INTRAVENOUS
  Administered 2015-08-06: 0.9 mg via INTRAVENOUS
  Filled 2015-08-05: qty 25

## 2015-08-05 MED ORDER — FENTANYL CITRATE (PF) 100 MCG/2ML IJ SOLN
INTRAMUSCULAR | Status: DC | PRN
  Administered 2015-08-05 (×2): 50 ug via INTRAVENOUS
  Administered 2015-08-05: 100 ug via INTRAVENOUS
  Administered 2015-08-05: 50 ug via INTRAVENOUS

## 2015-08-05 MED ORDER — FENTANYL CITRATE (PF) 250 MCG/5ML IJ SOLN
INTRAMUSCULAR | Status: AC
  Filled 2015-08-05: qty 25

## 2015-08-05 MED ORDER — VENLAFAXINE HCL ER 75 MG PO CP24: 75.0000 mg | ORAL_CAPSULE | Freq: Two times a day (BID) | ORAL | Status: DC

## 2015-08-05 MED ORDER — SIMETHICONE 80 MG PO CHEW: 80.0000 mg | CHEWABLE_TABLET | Freq: Four times a day (QID) | ORAL | Status: DC | PRN

## 2015-08-05 MED ORDER — ROCURONIUM BROMIDE 100 MG/10ML IV SOLN
INTRAVENOUS | Status: AC
  Filled 2015-08-05: qty 1

## 2015-08-05 MED ORDER — LACTATED RINGERS IV SOLN: INTRAVENOUS | Status: DC

## 2015-08-05 MED ORDER — NEOSTIGMINE METHYLSULFATE 10 MG/10ML IV SOLN
INTRAVENOUS | Status: DC | PRN
  Administered 2015-08-05: 4 mg via INTRAVENOUS

## 2015-08-05 MED ORDER — BUPIVACAINE HCL (PF) 0.5 % IJ SOLN
INTRAMUSCULAR | Status: DC | PRN
  Administered 2015-08-05: 30 mL

## 2015-08-05 MED ORDER — METOPROLOL SUCCINATE ER 25 MG PO TB24
25.0000 mg | ORAL_TABLET | Freq: Every day | ORAL | Status: DC
  Administered 2015-08-06: 25 mg via ORAL
  Filled 2015-08-05 (×2): qty 1

## 2015-08-05 MED ORDER — VASOPRESSIN 20 UNIT/ML IV SOLN
INTRAVENOUS | Status: AC
  Filled 2015-08-05: qty 1

## 2015-08-05 MED ORDER — ONDANSETRON HCL 4 MG/2ML IJ SOLN: 4.0000 mg | Freq: Once | INTRAMUSCULAR | Status: DC | PRN

## 2015-08-05 MED ORDER — MENTHOL 3 MG MT LOZG
1.0000 | LOZENGE | OROMUCOSAL | Status: DC | PRN
  Administered 2015-08-07: 3 mg via ORAL
  Filled 2015-08-05: qty 9

## 2015-08-05 MED ORDER — FENTANYL CITRATE (PF) 100 MCG/2ML IJ SOLN
25.0000 ug | INTRAMUSCULAR | Status: DC | PRN
  Administered 2015-08-05 (×2): 50 ug via INTRAVENOUS

## 2015-08-05 MED ORDER — METFORMIN HCL 500 MG PO TABS
500.0000 mg | ORAL_TABLET | Freq: Two times a day (BID) | ORAL | Status: DC
  Administered 2015-08-06 – 2015-08-07 (×3): 500 mg via ORAL
  Filled 2015-08-05 (×3): qty 1

## 2015-08-05 MED ORDER — NEOSTIGMINE METHYLSULFATE 10 MG/10ML IV SOLN
INTRAVENOUS | Status: AC
  Filled 2015-08-05: qty 1

## 2015-08-05 MED ORDER — MIDAZOLAM HCL 2 MG/2ML IJ SOLN
INTRAMUSCULAR | Status: AC
  Filled 2015-08-05: qty 4

## 2015-08-05 MED ORDER — LORATADINE 10 MG PO TABS
10.0000 mg | ORAL_TABLET | Freq: Every day | ORAL | Status: DC
  Administered 2015-08-06 – 2015-08-07 (×2): 10 mg via ORAL
  Filled 2015-08-05 (×2): qty 1

## 2015-08-05 MED ORDER — SCOPOLAMINE 1 MG/3DAYS TD PT72: 1.0000 | MEDICATED_PATCH | Freq: Once | TRANSDERMAL | Status: DC

## 2015-08-05 MED ORDER — VENLAFAXINE HCL ER 75 MG PO CP24
75.0000 mg | ORAL_CAPSULE | Freq: Every day | ORAL | Status: DC
  Administered 2015-08-06 – 2015-08-07 (×2): 75 mg via ORAL
  Filled 2015-08-05 (×2): qty 1

## 2015-08-05 MED ORDER — IBUPROFEN 600 MG PO TABS
600.0000 mg | ORAL_TABLET | Freq: Four times a day (QID) | ORAL | Status: DC | PRN
  Administered 2015-08-06 – 2015-08-07 (×2): 600 mg via ORAL
  Filled 2015-08-05 (×4): qty 1

## 2015-08-05 SURGICAL SUPPLY — 46 items
CANISTER SUCT 3000ML (MISCELLANEOUS) ×3 IMPLANT
CELLS DAT CNTRL 66122 CELL SVR (MISCELLANEOUS) ×1 IMPLANT
CLOTH BEACON ORANGE TIMEOUT ST (SAFETY) ×3 IMPLANT
CONT PATH 16OZ SNAP LID 3702 (MISCELLANEOUS) ×1 IMPLANT
DECANTER SPIKE VIAL GLASS SM (MISCELLANEOUS) ×3 IMPLANT
DRAPE CESAREAN BIRTH W POUCH (DRAPES) ×3 IMPLANT
DRSG OPSITE POSTOP 4X10 (GAUZE/BANDAGES/DRESSINGS) ×2 IMPLANT
DURAPREP 26ML APPLICATOR (WOUND CARE) ×3 IMPLANT
GAUZE SPONGE 4X4 16PLY XRAY LF (GAUZE/BANDAGES/DRESSINGS) ×2 IMPLANT
GLOVE BIOGEL PI IND STRL 7.0 (GLOVE) ×2 IMPLANT
GLOVE BIOGEL PI INDICATOR 7.0 (GLOVE) ×4
GLOVE ECLIPSE 7.0 STRL STRAW (GLOVE) ×3 IMPLANT
GOWN STRL REUS W/TWL LRG LVL3 (GOWN DISPOSABLE) ×6 IMPLANT
NEEDLE HYPO 22GX1.5 SAFETY (NEEDLE) ×6 IMPLANT
NS IRRIG 1000ML POUR BTL (IV SOLUTION) ×3 IMPLANT
PACK ABDOMINAL GYN (CUSTOM PROCEDURE TRAY) ×3 IMPLANT
PAD ABD 8X7 1/2 STERILE (GAUZE/BANDAGES/DRESSINGS) ×2 IMPLANT
PAD OB MATERNITY 4.3X12.25 (PERSONAL CARE ITEMS) ×3 IMPLANT
PENCIL SMOKE EVAC W/HOLSTER (ELECTROSURGICAL) ×3 IMPLANT
RETRACTOR WND ALEXIS 18 MED (MISCELLANEOUS) IMPLANT
RETRACTOR WND ALEXIS 25 LRG (MISCELLANEOUS) IMPLANT
RTRCTR WOUND ALEXIS 18CM MED (MISCELLANEOUS) ×3
RTRCTR WOUND ALEXIS 25CM LRG (MISCELLANEOUS)
SPONGE GAUZE 4X4 12PLY STER LF (GAUZE/BANDAGES/DRESSINGS) ×2 IMPLANT
SPONGE LAP 18X18 X RAY DECT (DISPOSABLE) ×6 IMPLANT
SPONGE SURGIFOAM ABS GEL 12-7 (HEMOSTASIS) ×2 IMPLANT
STAPLER VISISTAT 35W (STAPLE) IMPLANT
SUT PDS AB 0 CT1 27 (SUTURE) ×2 IMPLANT
SUT VIC AB 0 CT1 18XCR BRD8 (SUTURE) IMPLANT
SUT VIC AB 0 CT1 27 (SUTURE) ×9
SUT VIC AB 0 CT1 27XBRD ANBCTR (SUTURE) ×3 IMPLANT
SUT VIC AB 0 CT1 8-18 (SUTURE) ×6
SUT VIC AB 1 CTX 36 (SUTURE) ×6
SUT VIC AB 1 CTX36XBRD ANBCTRL (SUTURE) ×2 IMPLANT
SUT VIC AB 2-0 CT1 (SUTURE) IMPLANT
SUT VIC AB 3-0 SH 27 (SUTURE) ×6
SUT VIC AB 3-0 SH 27X BRD (SUTURE) ×2 IMPLANT
SUT VIC AB 4-0 KS 27 (SUTURE) ×3 IMPLANT
SUT VICRYL 0 TIES 12 18 (SUTURE) ×2 IMPLANT
SYR CONTROL 10ML LL (SYRINGE) ×6 IMPLANT
TOWEL OR 17X24 6PK STRL BLUE (TOWEL DISPOSABLE) ×6 IMPLANT
TRAY FOLEY CATH SILVER 14FR (SET/KITS/TRAYS/PACK) ×3 IMPLANT
TUBING NON-CON 1/4 X 20 CONN (TUBING) ×1 IMPLANT
TUBING NON-CON 1/4 X 20' CONN (TUBING) ×1
WATER STERILE IRR 1000ML POUR (IV SOLUTION) ×3 IMPLANT
YANKAUER SUCT BULB TIP NO VENT (SUCTIONS) ×2 IMPLANT

## 2015-08-05 NOTE — H&P (Signed)
Preoperative History and Physical  [name] is a 44 y.o. G1P1001 here for surgical management of multiple fibroids with bleeding and associated symptomatic anemia requiring transfusions in the past. Has history of DM. No significant preoperative concerns.  Proposed surgery: Abdominal myomectomy, possible hysterectomy   Past Medical History  Diagnosis Date  . Diabetes mellitus due to abnormal insulin   . Hypertension   . Congenital cerebral palsy   . Hearing impairment   . Depression   . Anemia   . Endometriosis   . Heart murmur     at birth  . Neuromuscular disorder     Cerebral palsy- very mild left side of brain  . Schizoaffective disorder    Past Surgical History  Procedure Laterality Date  . Cesarean section    . Exploratory laparotomy with abdominal mass excision     OB History  Gravida Para Term Preterm AB SAB TAB Ectopic Multiple Living  1 1 1       1     # Outcome Date GA Lbr Len/2nd Weight Sex Delivery Anes PTL Lv  1 Term 09/16/98 [redacted]w[redacted]d   M Vag-Spont       Patient denies any other pertinent gynecologic issues.   No current facility-administered medications on file prior to encounter.   Current Outpatient Prescriptions on File Prior to Encounter  Medication Sig Dispense Refill  . cetirizine (ZYRTEC) 10 MG tablet Take 30 mg by mouth daily.    . cyanocobalamin 1000 MCG tablet TAKE ONE (1) TABLET BY MOUTH EVERY DAY 90 tablet 1  . ferrous sulfate 325 (65 FE) MG tablet Take 1 tablet (325 mg total) by mouth 3 (three) times daily with meals. 90 tablet 3  . lisinopril-hydrochlorothiazide (PRINZIDE,ZESTORETIC) 10-12.5 MG per tablet Take 1 tablet by mouth daily. 30 tablet 6  . metFORMIN (GLUCOPHAGE) 500 MG tablet Take 1 tablet (500 mg total) by mouth 2 (two) times daily with a meal. 60 tablet 5  . metoprolol succinate (TOPROL-XL) 25 MG 24 hr tablet Take 25 mg by mouth daily.    . pravastatin (PRAVACHOL) 40 MG tablet TAKE ONE (1) TABLET BY MOUTH EVERY DAY 30 tablet 5   . venlafaxine XR (EFFEXOR-XR) 75 MG 24 hr capsule Take 75-150 mg by mouth 2 (two) times daily. TAKE ONE CAPSULE BY MOUTH IN THE MORNING AND TWO AT BEDTIME per patient     Allergies  Allergen Reactions  . Ivp Dye [Iodinated Diagnostic Agents] Nausea And Vomiting  . Pollen Extract     Seasonal allergies    Social History:   reports that she has never smoked. She has never used smokeless tobacco. She reports that she does not drink alcohol or use illicit drugs.  Family History  Problem Relation Age of Onset  . Heart failure Mother   . Hypertension Mother   . Diabetes Mellitus II Mother   . Cancer Mother   . Diabetes Mellitus II Father   . Hypertension Father     Review of Systems: Noncontributory  PHYSICAL EXAM: T 36.6 C BP 114/70 P 78 R 18 SpO2 100% CBG 95 CONSTITUTIONAL: Well-developed, well-nourished female in no acute distress.  HENT:  Normocephalic, atraumatic, External right and left ear normal. Oropharynx is clear and moist EYES: Conjunctivae and EOM are normal. Pupils are equal, round, and reactive to light. No scleral icterus.  NECK: Normal range of motion, supple, no masses SKIN: Skin is warm and dry. No rash noted. Not diaphoretic. No erythema. No pallor. [place]: Alert and oriented to  person, place, and time. Normal reflexes, muscle tone coordination. No cranial nerve deficit noted. PSYCHIATRIC: Normal mood and affect. Normal behavior. Normal judgment and thought content. CARDIOVASCULAR: Normal heart rate noted, regular rhythm RESPIRATORY: Effort and breath sounds normal, no problems with respiration noted ABDOMEN: Soft, obese, nontender, nondistended.Well-healed Pfannenstiel incision. PELVIC: Deferred MUSCULOSKELETAL: Normal range of motion. No edema and no tenderness. 2+ distal pulses.  Labs: Results for orders placed or performed during the hospital encounter of 08/05/15 (from the past 336 hour(s))  Pregnancy, urine   Collection Time: 08/05/15  8:15 AM   Result Value Ref Range   Preg Test, Ur NEGATIVE NEGATIVE  Results for orders placed or performed during the hospital encounter of 07/30/15 (from the past 336 hour(s))  CBC   Collection Time: 07/30/15 12:30 PM  Result Value Ref Range   WBC 5.2 4.0 - 10.5 K/uL   RBC 3.92 3.87 - 5.11 MIL/uL   Hemoglobin 11.5 (L) 12.0 - 15.0 g/dL   HCT 34.5 (L) 36.0 - 46.0 %   MCV 88.0 78.0 - 100.0 fL   MCH 29.3 26.0 - 34.0 pg   MCHC 33.3 30.0 - 36.0 g/dL   RDW 15.6 (H) 11.5 - 15.5 %   Platelets 266 150 - 400 K/uL  Basic metabolic panel   Collection Time: 07/30/15 12:30 PM  Result Value Ref Range   Sodium 139 135 - 145 mmol/L   Potassium 3.6 3.5 - 5.1 mmol/L   Chloride 100 (L) 101 - 111 mmol/L   CO2 31 22 - 32 mmol/L   Glucose, Bld 107 (H) 65 - 99 mg/dL   BUN 8 6 - 20 mg/dL   Creatinine, Ser 0.70 0.44 - 1.00 mg/dL   Calcium 9.5 8.9 - 10.3 mg/dL   GFR calc non Af Amer >60 >60 mL/min   GFR calc Af Amer >60 >60 mL/min   Anion gap 8 5 - 15  Type and screen   Collection Time: 07/30/15 12:30 PM  Result Value Ref Range   ABO/RH(D) O NEG    Antibody Screen NEG    Sample Expiration 08/13/2015   ABO/Rh   Collection Time: 07/30/15 12:30 PM  Result Value Ref Range   ABO/RH(D) O NEG    07/03/2015 Endometrial biopsy : Scant benign proliferative endometrium  Imaging Studies: 03/04/2015  TRANSABDOMINAL AND TRANSVAGINAL ULTRASOUND OF PELVIS CLINICAL DATA: Fibroids. COMPARISON: 11/26/2011 FINDINGS: Uterus Measurements: 17 x 10 x 13 cm. There are numerous myometrial masses with heterogeneous, predominant hypoechoic appearance. The largest and measured are located in the sub serosal anterior body at 44 mm, intramural fundus measuring up to 52 mm, sub serosal posterior uterine body at the 53 mm, and posterior lower uterine segment at up to 66 mm. Endometrium Thickness: Where visualized 15 mm. Note that the majority the endometrium is obscured by the fibroid shadowing and distortion Right ovary Measurements:  5.7 x 4.6 x 4.9 cm. 2 cm peripherally echogenic cystic structure in the right ovary is compatible with a corpus luteum. Left ovary  Measurements: 4.1 x 2.7 x 2.9 cm. Normal appearance/no adnexal mass. Other findings  No free fluid. IMPRESSION: 1. No acute findings. 2. Multifibroid uterus with extensive distortion and obscuration of the endometrium.   Assessment: Patient Active Problem List   Diagnosis Date Noted  . Cough 05/21/2015  . Dizziness 03/13/2015  . Symptomatic anemia 03/04/2015  . Uterine fibroid 03/04/2015  . Hyperlipidemia LDL goal <70 09/10/2014  . Thyroid nodule 07/30/2014  . History of syncope 07/30/2014  . Morbid  obesity 07/30/2014  . Depression, controlled 07/30/2014  . Sprain of left ankle 07/30/2014  . Preventative health care 07/30/2014  . Essential hypertension 11/07/2012  . Type 2 diabetes mellitus 11/07/2012  . LEARNING DISABILITY 09/26/2007  . FIBROCYSTIC BREAST DISEASE 09/26/2007  . ENDOMETRIOSIS 09/26/2007  . CARDIAC MURMUR 09/26/2007    Plan: Patient will undergo surgical management with abdominal myomectomy, possible hysterectomy for these multiple fibroids, and history of bleeding and associated symptomatic anemia requiring transfusion.   The risks of surgery were discussed in detail with the patient including but not limited to: bleeding which may require transfusion or reoperation and hysterectomy (3-4% risk of intraoperative conversion to hysterectomy: patient gives permission to proceed with hysterectomy if needed); infection which may require antibiotics especially given history of DM; injury to bowel, bladder, ureters or other surrounding organs; need for additional procedures; formation of intraperitoneal adhesions that may lead to other complications or make future surgeries more difficult;  thromboembolic phenomenon, incisional problems and other postoperative/anesthesia complications.  Also discussed possible need for cesarean deliveries at 60 -  [redacted] weeks gestation for subsequent pregnancies if the endometrial cavity is breached and possible recurrence of fibroids; 25% of women who undergo this procedure require further treatment.  Likelihood of success in alleviating the patient's condition was discussed. Routine postoperative instructions will be reviewed with the patient and her family in detail after surgery.  The patient concurred with the proposed plan, giving informed written consent for the surgery.  Patient has been NPO since last night she will remain NPO for procedure.  Anesthesia and OR aware.  Preoperative prophylactic antibiotics and SCDs ordered on call to the OR.  To OR when ready.   Verita Schneiders, M.D. 08/05/2015 8:53 AM

## 2015-08-05 NOTE — Op Note (Signed)
[name] PROCEDURE DATE: 08/05/2015  PREOPERATIVE DIAGNOSES:  Symptomatic fibroids, dysfunctional uterine bleeding and associated anemia, endometriosis POSTOPERATIVE DIAGNOSES:  The same SURGEON:   Verita Schneiders, M.D. ASSISTANT: Clovia Cuff, M.D. OPERATION:  Total abdominal hysterectomy, Bilateral Salpingectomy ANESTHESIA:  General endotracheal.  INDICATIONS: The patient is a 44 y.o. G1P1001 with the aforementioned diagnoses who desired abdominal myomectomy.  On the day of surgery, she was counseled about the risks of myomectomy and the possibility of conversion to hysterectomy; she consented to hysterectomy if indicated due size of fibroids or increased expected blood loss (wanted to avoid blood transfusions).  The risks of surgery were again discussed with the patient including but not limited to: bleeding which may require transfusion or reoperation; infection which may require antibiotics; injury to bowel, bladder, ureters or other surrounding organs; need for additional procedures; thromboembolic phenomenon, incisional problems and other postoperative/anesthesia complications. Written informed consent was obtained.    OPERATIVE FINDINGS: A large multi-lobulated fibroid uterus with numerous fibroids throughout the fundus and in the lower uterine segment and top of the cervix adjacent to uterine vessels; this prompted the decision to change from planned myomectomy to hysterectomy. Largest fibroid seemed to be 10 cm in size.  Normal tubes and ovaries bilaterally. Mild adhesive disease involving bladder and lower uterine segment.  Endometriosis black lesions noted on serosa of the uterus.  ESTIMATED BLOOD LOSS: 400 ml FLUIDS:  1700 ml of Lactated Ringers URINE OUTPUT:  300 ml of clear yellow urine. SPECIMENS:  Uterus, cervix, bilateral fallopian tubes sent to pathology COMPLICATIONS:  None immediate.   DESCRIPTION OF PROCEDURE:  The patient received intravenous antibiotics and had  sequential compression devices applied to her lower extremities while in the preoperative area.   She was taken to the operating room and placed under general anesthesia without difficulty.The abdomen and perineum were prepped and draped in a sterile manner, and she was placed in a dorsal supine position.  A Foley catheter was inserted into the bladder and attached to constant drainage. After an adequate timeout was performed, a Pfannensteil skin incision was made over her preexisting scar. This incision was taken down to the fascia using electrocautery with care given to maintain good hemostasis. The fascia was incised in the midline and the fascial incision was then extended bilaterally using electrocautery without difficulty. The fascia was then dissected off the underlying rectus muscles using blunt and sharp dissection. The rectus muscles were split bluntly in the midline and the peritoneum entered sharply without complication. This peritoneal incision was then extended superiorly and inferiorly with care given to prevent bowel or bladder injury. Attention was then turned to the pelvis. An attempt was made to deliver the uterus but this was not possible due to size of the uterus.  A 2 cm horizontal incision was made on the left rectus muscle and this helped create more room to manipulate the uterus out of the abdomen with some difficulty. The bowel was packed away with moist laparotomy sponges. After detailed examination of the uterus and multiple fibroids; it was determined that a myomectomy would be very difficult and lead to excessive blood loss given the multiple fibroids and the locations of these fibroids on the uterus.  The decision was made to proceed with hysterectomy.  The round ligaments and adnexae were clamped on the patient's right side, cut, and doubly suture ligated with 0 Vicryl. Of note, all sutures used in this procedure are 0 Vicryl unless otherwise noted.  This procedure was repeated in  an identical fashion on the left site allowing for both adnexa to remain in place.  Kelly clamps were placed on the mesosalpinx of the right fallopian tube, and the fallopian tube was excised.  The pedicle was then secured with a free tie.  A similar process was carried out on the left side, allowing for bilateral salpingectomy.    A bladder flap was then created.  The bladder was then sharply and bluntly dissected off the lower uterine segment and cervix. The uterine arteries were then skeletonized bilaterally and then clamped, cut, and doubly suture ligated with care given to prevent ureteral injury. There was increased bleeding encountered when trying to ligate the uterne vessels given the proximity of the fibroids.  The uterus was then amputated across the lower uterine segment leaving the cervix intact to enable improved visualization; a retractor was also placed at this time.   Kocher clamps were used to elevate the cervix, the uterosacral ligaments were then clamped, cut, and ligated bilaterally.  Finally, the cardinal ligaments were clamped, cut, and ligated bilaterally.  The cervix was then dissected from surrounding vaginal cuff.  The vaginal cuff angles were closed with Heaney stiches with care given to incorporate the uterosacral-cardinal ligament pedicles on both sides. The middle of the vaginal cuff was closed with a running interlocked suture with care given to incorporate the anterior pubocervical fascia and the posterior rectovaginal fascia.   The pelvis was irrigated and hemostasis was reconfirmed at all pedicles and along the pelvic sidewall.   All laparotomy sponges and instruments were removed from the abdomen. The peritoneum was closed with a running stitch, and the fascia was also closed in a running fashion with 0 PDS. The subcutaneous layer was reapproximated with interrupted stitches. The skin was closed with a 4-0 Vicryl subcuticular stitch. Sponge, lap, needle, and instrument counts were  correct times two. The patient was taken to the recovery area awake, extubated and in stable condition.   Verita Schneiders, MD, [place] Attending [place], [facility]

## 2015-08-05 NOTE — Anesthesia Procedure Notes (Signed)
Procedure Name: Intubation Performed by: Lauretta Grill Pre-anesthesia Checklist: Patient identified, Emergency Drugs available, Suction available and Patient being monitored Patient Re-evaluated:Patient Re-evaluated prior to inductionOxygen Delivery Method: Circle System Utilized Preoxygenation: Pre-oxygenation with 100% oxygen Intubation Type: IV induction Ventilation: Mask ventilation without difficulty Laryngoscope Size: Mac and 3 Grade View: Grade I Tube type: Oral Number of attempts: 1 Airway Equipment and Method: Stylet and Oral airway Placement Confirmation: ETT inserted through vocal cords under direct vision,  positive ETCO2 and breath sounds checked- equal and bilateral Secured at: 23 cm Tube secured with: Tape Dental Injury: Teeth and Oropharynx as per pre-operative assessment  Comments: Grade III view obtained by CRNA on first attempt. Patient was positioned in a ramp positioning.

## 2015-08-05 NOTE — Anesthesia Preprocedure Evaluation (Addendum)
Anesthesia Evaluation  Patient identified by MRN, date of birth, ID band Patient awake    Reviewed: Allergy & Precautions, NPO status , Patient's Chart, lab work & pertinent test results  History of Anesthesia Complications Negative for: history of anesthetic complications  Airway Mallampati: II  TM Distance: >3 FB Neck ROM: Full    Dental no notable dental hx. (+) Dental Advisory Given   Pulmonary neg pulmonary ROS,    Pulmonary exam normal breath sounds clear to auscultation       Cardiovascular hypertension, Pt. on medications and Pt. on home beta blockers Normal cardiovascular exam Rhythm:Regular Rate:Normal     Neuro/Psych PSYCHIATRIC DISORDERS Depression Schizophrenia Cerebral palsy, reports left sided weakness, ambulates without assist device    GI/Hepatic negative GI ROS, Neg liver ROS,   Endo/Other  diabetes, Type 2, Oral Hypoglycemic Agentsobesity  Renal/GU negative Renal ROS  negative genitourinary   Musculoskeletal negative musculoskeletal ROS (+)   Abdominal   Peds negative pediatric ROS (+)  Hematology  (+) anemia ,   Anesthesia Other Findings   Reproductive/Obstetrics negative OB ROS                            Anesthesia Physical Anesthesia Plan  ASA: III  Anesthesia Plan: General   Post-op Pain Management:    Induction: Intravenous  Airway Management Planned: Oral ETT  Additional Equipment:   Intra-op Plan:   Post-operative Plan: Extubation in OR  Informed Consent: I have reviewed the patients History and Physical, chart, labs and discussed the procedure including the risks, benefits and alternatives for the proposed anesthesia with the patient or authorized representative who has indicated his/her understanding and acceptance.   Dental advisory given  Plan Discussed with: CRNA  Anesthesia Plan Comments: (Patient reports that she will accept any and all  blood products. )       Anesthesia Quick Evaluation

## 2015-08-05 NOTE — Anesthesia Postprocedure Evaluation (Signed)
  Anesthesia Post-op Note  Patient: [name]  Procedure(s) Performed: Procedure(s) (LRB): Abdominal Hysterectomy, Bilateral Salpingectomy (N/A)  Patient Location: PACU  Anesthesia Type: general  Level of Consciousness: awake and alert   Airway and Oxygen Therapy: Patient Spontanous Breathing  Post-op Pain: mild  Post-op Assessment: Post-op Vital signs reviewed, Patient's Cardiovascular Status Stable, Respiratory Function Stable, Patent Airway and No signs of Nausea or vomiting  Last Vitals:  Filed Vitals:   08/05/15 1606  BP: 118/65  Pulse: 64  Temp: 36.8 C  Resp: 15    Post-op Vital Signs: stable   Complications: No apparent anesthesia complications

## 2015-08-05 NOTE — Transfer of Care (Signed)
Immediate Anesthesia Transfer of Care Note  Patient: [name]  Procedure(s) Performed: Procedure(s): Abdominal Hysterectomy, Bilateral Salpingectomy (N/A)  Patient Location: PACU  Anesthesia Type:General  Level of Consciousness: awake, alert , oriented and patient cooperative  Airway & Oxygen Therapy: Patient Spontanous Breathing and Patient connected to nasal cannula oxygen  Post-op Assessment: Report given to RN and Post -op Vital signs reviewed and stable  Post vital signs: Reviewed and stable  Last Vitals:  Filed Vitals:   08/05/15 0858  BP: 114/79  Pulse: 78  Temp: 36.6 C  Resp: 20    Complications: No apparent anesthesia complications

## 2015-08-06 ENCOUNTER — Encounter (HOSPITAL_COMMUNITY): Payer: Self-pay | Admitting: Obstetrics & Gynecology

## 2015-08-06 LAB — CBC
HCT: 28.4 % — ABNORMAL LOW (ref 36.0–46.0)
Hemoglobin: 9.4 g/dL — ABNORMAL LOW (ref 12.0–15.0)
MCH: 29.5 pg (ref 26.0–34.0)
MCHC: 33.1 g/dL (ref 30.0–36.0)
MCV: 89 fL (ref 78.0–100.0)
PLATELETS: 240 10*3/uL (ref 150–400)
RBC: 3.19 MIL/uL — AB (ref 3.87–5.11)
RDW: 15.1 % (ref 11.5–15.5)
WBC: 9.7 10*3/uL (ref 4.0–10.5)

## 2015-08-06 LAB — GLUCOSE, CAPILLARY
GLUCOSE-CAPILLARY: 120 mg/dL — AB (ref 65–99)
GLUCOSE-CAPILLARY: 139 mg/dL — AB (ref 65–99)

## 2015-08-06 MED ORDER — INFLUENZA VAC SPLIT QUAD 0.5 ML IM SUSY
0.5000 mL | PREFILLED_SYRINGE | INTRAMUSCULAR | Status: AC
  Administered 2015-08-07: 0.5 mL via INTRAMUSCULAR
  Filled 2015-08-06: qty 0.5

## 2015-08-06 MED ORDER — PNEUMOCOCCAL VAC POLYVALENT 25 MCG/0.5ML IJ INJ
0.5000 mL | INJECTION | INTRAMUSCULAR | Status: DC
  Filled 2015-08-06: qty 0.5

## 2015-08-06 NOTE — Anesthesia Postprocedure Evaluation (Signed)
  Anesthesia Post-op Note  Patient: [name]  Procedure(s) Performed: Procedure(s): Abdominal Hysterectomy, Bilateral Salpingectomy (N/A)  Patient Location: [facility]  Anesthesia Type:General  Level of Consciousness: awake, alert  and oriented  Airway and Oxygen Therapy: Patient Spontanous Breathing  Post-op Pain: none  Post-op Assessment: Post-op Vital signs reviewed, Patient's Cardiovascular Status Stable, Respiratory Function Stable, No signs of Nausea or vomiting, Adequate PO intake and Pain level controlled              Post-op Vital Signs: Reviewed and stable  Last Vitals:  Filed Vitals:   08/06/15 0459  BP: 112/59  Pulse: 76  Temp: 36.7 C  Resp: 16    Complications: No apparent anesthesia complications

## 2015-08-06 NOTE — Addendum Note (Signed)
Addendum  created 08/06/15 0936 by Jonna Munro, CRNA   Modules edited: Notes Section   Notes Section:  File: [id number]

## 2015-08-06 NOTE — Progress Notes (Signed)
1 Day Post-Op Procedure(s) (LRB): Abdominal Hysterectomy, Bilateral Salpingectomy (N/A)  Subjective: Patient reports tolerating PO, + flatus and no problems voiding.  Not much OOB yet.   Objective: I have reviewed patient's vital signs, intake and output, medications and labs. Temp:  [97.5 F (36.4 C)-98.2 F (36.8 C)] 98 F (36.7 C) (09/21 0459) Pulse Rate:  [61-79] 76 (09/21 0459) Resp:  [10-21] 16 (09/21 0459) BP: (94-125)/(52-80) 112/59 mmHg (09/21 0459) SpO2:  [94 %-100 %] 100 % (09/21 0459) Weight:  [208 lb (94.348 kg)] 208 lb (94.348 kg) (09/20 2355)   Recent Labs  08/05/15 0844 08/05/15 1248 08/05/15 1907  GLUCAP 95 134* 139*   09/20 0701 - 09/21 0700 In: 3818 [P.O.:1440; I.V.:3739] Out: 3600 [Urine:3200; Blood:400]  General: alert and no distress Resp: clear to auscultation bilaterally Cardio: regular rate and rhythm GI: soft, non-tender; bowel sounds normal; no masses,  no organomegaly and incision: clean, dry and intact Extremities: extremities normal, atraumatic, no cyanosis or edema and Homans sign is negative, no sign of DVT Vaginal Bleeding: minimal  CBC Latest Ref Rng 08/06/2015 07/30/2015 05/21/2015  WBC 4.0 - 10.5 K/uL 9.7 5.2 5.5  Hemoglobin 12.0 - 15.0 g/dL 9.4(L) 11.5(L) 12.7  Hematocrit 36.0 - 46.0 % 28.4(L) 34.5(L) 38.6  Platelets 150 - 400 K/uL 240 266 267   Assessment: s/p Procedure(s): Abdominal Hysterectomy, Bilateral Salpingectomy (N/A): stable and progressing well  Plan: Advance diet Encourage ambulation Advance to PO medication and continue DM regimen Discontinue IV fluids  Discharge to home tomorrow if remains stable    LOS: 1 day    ANYANWU,UGONNA A, MD 08/06/2015, 7:24 AM

## 2015-08-07 DIAGNOSIS — D252 Subserosal leiomyoma of uterus: Secondary | ICD-10-CM | POA: Diagnosis not present

## 2015-08-07 LAB — GLUCOSE, CAPILLARY
GLUCOSE-CAPILLARY: 72 mg/dL (ref 65–99)
Glucose-Capillary: 101 mg/dL — ABNORMAL HIGH (ref 65–99)

## 2015-08-07 MED ORDER — IBUPROFEN 600 MG PO TABS: 600.0000 mg | ORAL_TABLET | Freq: Four times a day (QID) | ORAL | Status: DC | PRN

## 2015-08-07 MED ORDER — OXYCODONE-ACETAMINOPHEN 5-325 MG PO TABS: 1.0000 | ORAL_TABLET | ORAL | Status: DC | PRN

## 2015-08-07 MED ORDER — DOCUSATE SODIUM 100 MG PO CAPS: 100.0000 mg | ORAL_CAPSULE | Freq: Two times a day (BID) | ORAL | Status: DC | PRN

## 2015-08-07 NOTE — Discharge Instructions (Addendum)
Abdominal Hysterectomy, Care After Refer to this sheet in the next few weeks. These instructions provide you with information on caring for yourself after your procedure. Your health care provider may also give you more specific instructions. Your treatment has been planned according to current medical practices, but problems sometimes occur. Call your health care provider if you have any problems or questions after your procedure.  WHAT TO EXPECT AFTER THE PROCEDURE After your procedure, it is typical to have the following:  Pain.  Feeling tired.  Poor appetite.  Less interest in sex. HOME CARE INSTRUCTIONS  It takes 6-8 weeks to recover from this surgery. Make sure you follow all your health care provider's instructions. Home care instructions may include:  Take pain medicines only as directed by your health care provider. Do not take over-the-counter pain medicines without checking with your health care provider first.  Change your bandage as directed by your health care provider.  Return to your health care provider to have your sutures taken out.  Take showers instead of baths for 2-3 weeks. Ask your health care provider when it is safe to start showering.  Do not douche, use tampons, or have sexual intercourse for at least 8 weeks  Follow your health care provider's advice about exercise, lifting, driving, and general activities.  Get plenty of rest and sleep.   Do not lift anything heavier than 15 lb for the first month after surgery.  You can resume your normal diet if your health care provider says it is okay.   Do not drink alcohol until your health care provider says you can.   If you are constipated, ask your health care provider if you can take a mild laxative.  Eating foods high in fiber may also help with constipation. Eat plenty of raw fruits and vegetables, whole grains, and beans.  Drink enough fluids to keep your urine clear or pale yellow.   Try to  have someone at home with you for the first 1-2 weeks to help around the house.  Keep all follow-up appointments. SEEK MEDICAL CARE IF:   You have chills or fever.  You have swelling, redness, or pain in the area of your incision that is getting worse.   You have pus coming from the incision.   You notice a bad smell coming from the incision or bandage.   Your incision breaks open.   You feel dizzy or light-headed.   You have pain or bleeding when you urinate.   You have persistent diarrhea.   You have persistent nausea and vomiting.   You have abnormal vaginal discharge.   You have a rash.   You have any type of abnormal reaction or develop an allergy to your medicine.   Your pain medicine is not helping.  SEEK IMMEDIATE MEDICAL CARE IF:   You have a fever and your symptoms suddenly get worse.  You have severe abdominal pain.  You have chest pain.  You have shortness of breath.  You faint.  You have pain, swelling, or redness of your leg.  You have heavy vaginal bleeding with blood clots. MAKE SURE YOU:  Understand these instructions.  Will watch your condition.  Will get help right away if you are not doing well or get worse. Document Released: 05/21/2005 Document Revised: 11/06/2013 Document Reviewed: 08/24/2013 [employer] [facility] [employer] Patient Information 2015 [place], [place]. This information is not intended to replace advice given to you by your health care provider. Make sure you discuss any questions  you have with your health care provider.

## 2015-08-07 NOTE — Discharge Summary (Signed)
Gynecology Physician Postoperative Discharge Summary  Patient ID: Claudette Wermuth MRN: [id number] DOB/AGE: [birth year] 44 y.o.  Admit Date: 08/05/2015 Discharge Date: 08/07/2015  Preoperative Diagnoses: Symptomatic fibroids, dysfunctional uterine bleeding and associated anemia, endometriosis  Procedures: Total Abdominal Hysterectomy, Bilateral Salpingectomy   Significant Labs: CBC Latest Ref Rng 08/06/2015 07/30/2015 05/21/2015  WBC 4.0 - 10.5 K/uL 9.7 5.2 5.5  Hemoglobin 12.0 - 15.0 g/dL 9.4(L) 11.5(L) 12.7  Hematocrit 36.0 - 46.0 % 28.4(L) 34.5(L) 38.6  Platelets 150 - 400 K/uL 240 266 267    Hospital Course:  [name] is a 44 y.o. G1P1001  admitted for scheduled surgery.  She underwent the procedures as mentioned above, her operation was uncomplicated. For further details about surgery, please refer to the operative report. Patient had an uncomplicated postoperative course.  Her CBGs were in good range; was treated with home dosage of Metformin.  By time of discharge on POD#2, her pain was controlled on oral pain medications; she was ambulating, voiding without difficulty, tolerating regular diet and passing flatus.  She was deemed stable for discharge to home.   Discharge Exam: Blood pressure 127/77, pulse 84, temperature 98 F (36.7 C), temperature source Oral, resp. rate 18, height 5\' 4"  (1.626 m), weight 208 lb (94.348 kg), SpO2 99 %. General appearance: alert and no distress  Resp: clear to auscultation bilaterally  Cardio: regular rate and rhythm  GI: soft, non-tender; bowel sounds normal; no masses, no organomegaly.  Incision: C/D/I, no erythema, no drainage noted Pelvic: scant blood on pad  Extremities: extremities normal, atraumatic, no cyanosis or edema and Homans sign is negative, no sign of DVT  Discharged Condition: Stable  Disposition: 01-Home or Self Care     Medication List    TAKE these medications        cetirizine 10 MG tablet  Commonly known as:  ZYRTEC   Take 30 mg by mouth daily.     cyanocobalamin 1000 MCG tablet  TAKE ONE (1) TABLET BY MOUTH EVERY DAY     docusate sodium 100 MG capsule  Commonly known as:  COLACE  Take 1 capsule (100 mg total) by mouth 2 (two) times daily as needed.     ferrous sulfate 325 (65 FE) MG tablet  Take 1 tablet (325 mg total) by mouth 3 (three) times daily with meals.     ibuprofen 600 MG tablet  Commonly known as:  ADVIL,MOTRIN  Take 1 tablet (600 mg total) by mouth every 6 (six) hours as needed for headache, mild pain, moderate pain or cramping.     lisinopril-hydrochlorothiazide 10-12.5 MG per tablet  Commonly known as:  PRINZIDE,ZESTORETIC  Take 1 tablet by mouth daily.     metFORMIN 500 MG tablet  Commonly known as:  GLUCOPHAGE  Take 1 tablet (500 mg total) by mouth 2 (two) times daily with a meal.     metoprolol succinate 25 MG 24 hr tablet  Commonly known as:  TOPROL-XL  Take 25 mg by mouth daily.     oxyCODONE-acetaminophen 5-325 MG per tablet  Commonly known as:  PERCOCET/ROXICET  Take 1-2 tablets by mouth every 4 (four) hours as needed for severe pain (moderate to severe pain (when tolerating fluids)).     pravastatin 40 MG tablet  Commonly known as:  PRAVACHOL  TAKE ONE (1) TABLET BY MOUTH EVERY DAY     venlafaxine XR 75 MG 24 hr capsule  Commonly known as:  EFFEXOR-XR  Take 75-150 mg by mouth 2 (two) times daily. TAKE  ONE CAPSULE BY MOUTH IN THE MORNING AND TWO AT BEDTIME per patient           Follow-up Information    Follow up with Osborne Oman, MD On 09/18/2015.   Specialty:  Obstetrics and Gynecology   Why:  11 am for postoperative appointment Call clinic/come to MAU for any concerning issues   Contact information:   [place] [address] [phone]       Signed:  Verita Schneiders, MD, [place] Attending [place], [facility]

## 2015-08-07 NOTE — Progress Notes (Signed)
Pt discharged via wheelchair with mother. D/C instructions reviewed with pt and mother. No questions or concerns. NO equipment sent home with pt.

## 2015-08-07 NOTE — Care Management Important Message (Signed)
Important Message  Patient Details  Name: Shalom Ware MRN: [id number] Date of Birth: 1971/06/14   Medicare Important Message Given:  Yes-second notification given    Shelda Altes 08/07/2015, 11:27 AMImportant Message  Patient Details  Name: Tysheena Ginzburg MRN: [id number] Date of Birth: 10-10-1971   Medicare Important Message Given:  Yes-second notification given    Shelda Altes 08/07/2015, 11:27 AM

## 2015-08-08 ENCOUNTER — Telehealth: Payer: Self-pay | Admitting: *Deleted

## 2015-08-08 NOTE — Telephone Encounter (Signed)
Contacted patient and informed of results.  Pt verbalizes understanding.  Information given of why change in surgical procedure.  No further questions.  Post op visit date and time given.

## 2015-08-18 ENCOUNTER — Other Ambulatory Visit: Payer: Self-pay | Admitting: Internal Medicine

## 2015-08-18 DIAGNOSIS — I10 Essential (primary) hypertension: Secondary | ICD-10-CM

## 2015-08-18 MED ORDER — PRAVASTATIN SODIUM 40 MG PO TABS: 40.0000 mg | ORAL_TABLET | Freq: Every day | ORAL | Status: DC

## 2015-08-18 MED ORDER — METOPROLOL SUCCINATE ER 25 MG PO TB24: 25.0000 mg | ORAL_TABLET | Freq: Every day | ORAL | Status: DC

## 2015-08-18 NOTE — Telephone Encounter (Signed)
Pt called requesting lisinopril, metformin, metoprolol and ferrous sulfate to be filled @ [facility].

## 2015-08-19 ENCOUNTER — Ambulatory Visit: Payer: Medicare Other | Admitting: Advanced Practice Midwife

## 2015-08-19 ENCOUNTER — Telehealth: Payer: Self-pay | Admitting: General Practice

## 2015-08-19 VITALS — BP 130/71 | HR 82 | Temp 100.5°F

## 2015-08-19 DIAGNOSIS — T8140XA Infection following a procedure, unspecified, initial encounter: Secondary | ICD-10-CM

## 2015-08-19 DIAGNOSIS — R509 Fever, unspecified: Secondary | ICD-10-CM

## 2015-08-19 LAB — POCT URINALYSIS DIP (DEVICE)
BILIRUBIN URINE: NEGATIVE
Glucose, UA: NEGATIVE mg/dL
HGB URINE DIPSTICK: NEGATIVE
Ketones, ur: NEGATIVE mg/dL
Nitrite: NEGATIVE
PH: 5.5 (ref 5.0–8.0)
PROTEIN: NEGATIVE mg/dL
SPECIFIC GRAVITY, URINE: 1.02 (ref 1.005–1.030)
Urobilinogen, UA: 0.2 mg/dL (ref 0.0–1.0)

## 2015-08-19 MED ORDER — SULFAMETHOXAZOLE-TRIMETHOPRIM 400-80 MG PO TABS: 1.0000 | ORAL_TABLET | Freq: Two times a day (BID) | ORAL | Status: DC

## 2015-08-19 NOTE — Progress Notes (Signed)
Subjective:     Patient ID: [name], female   DOB: 1971/01/12, 44 y.o.   MRN: [id number]  HPI  44 y.o. G1P1001 presents to clinic for problem visit reporting bleeding with onset yesterday from lower abdominal and umbilical incisions from her recent TAH.  She also reports feeling hot/cold and chills for the last 2 days.  She had planned myomectomy on 9/20 and hysterectomy was performed related to number and size of fibroids/risk of bleeding.  Pt mother is present and notes that she has been encouraging the pt to walk every day and is concerned maybe she is overdoing things.  Pt reports abdominal pain increased in last 2 days and slightly malodorous clear drainage from incisions.    Review of Systems  Constitutional: Negative for fever, chills and fatigue.  HENT: Negative for sinus pressure.   Eyes: Negative for photophobia.  Respiratory: Negative for shortness of breath.   Cardiovascular: Negative for chest pain.  Gastrointestinal: Negative for nausea, vomiting, diarrhea and constipation.  Genitourinary: Negative for dysuria, frequency, flank pain, vaginal bleeding, vaginal discharge, difficulty urinating, vaginal pain and pelvic pain.  Musculoskeletal: Negative for neck pain.  Skin:       Drainage and slight bleeding from incision   Neurological: Negative for dizziness, weakness and headaches.  Psychiatric/Behavioral: Negative.        Objective:   Physical Exam  Constitutional: She is oriented to person, place, and time. She appears well-developed and well-nourished.  Neck: Normal range of motion.  Cardiovascular: Normal rate, regular rhythm and normal heart sounds.   Pulmonary/Chest: Effort normal and breath sounds normal.  Abdominal: Soft. Bowel sounds are normal.  Musculoskeletal: Normal range of motion.  Neurological: She is alert and oriented to person, place, and time.  Skin: Skin is warm.  Taped dressing from surgery on 9/20 remains on lower abdominal incision, but is  partially untaped and hanging off of abdomen.  Removed this dressing during exam. Abdominal incision well approximated with some slight erythema and clear drainage from left side of incision.  Tenderness to palpation but no erythema noted on skin surrounding incision. Tenderness at umbilicus, good approximation and no erythema or drainage from site.    Psychiatric: She has a normal mood and affect. Her behavior is normal. Judgment and thought content normal.  Nursing note and vitals reviewed.      Assessment:     1. Fever and chills   2. Postoperative infection, initial encounter        Plan:      Bactrim BID x 10 days F/U in office as scheduled on 10/12 Call if fever/pain persist Come to MAU as needed for emergencies

## 2015-08-19 NOTE — Telephone Encounter (Signed)
Patient's mother Aspyn Warnke called and left message stating [place] is having problems with her incision. States blood is coming out of her navel and thinks she has an infection, please call back. Called and patient answered stating blood is coming out of her incision and has been since Friday. Patient denies recently irritating or picking at incision. Offered for her to come in today to have incision looked at. Patient verbalized understanding and states she will call her mom to come get her. Patient had no questions

## 2015-08-20 ENCOUNTER — Encounter (HOSPITAL_COMMUNITY): Payer: Self-pay | Admitting: *Deleted

## 2015-08-20 ENCOUNTER — Telehealth: Payer: Self-pay | Admitting: General Practice

## 2015-08-20 ENCOUNTER — Inpatient Hospital Stay (HOSPITAL_COMMUNITY): Payer: Medicare Other

## 2015-08-20 ENCOUNTER — Inpatient Hospital Stay (HOSPITAL_COMMUNITY)
Admission: AD | Admit: 2015-08-20 | Discharge: 2015-08-20 | Disposition: A | Discharge status: Home / Self Care | Payer: Medicare Other | Source: Ambulatory Visit | Attending: Obstetrics and Gynecology | Admitting: Obstetrics and Gynecology

## 2015-08-20 DIAGNOSIS — G809 Cerebral palsy, unspecified: Secondary | ICD-10-CM | POA: Diagnosis not present

## 2015-08-20 DIAGNOSIS — R5082 Postprocedural fever: Secondary | ICD-10-CM | POA: Insufficient documentation

## 2015-08-20 DIAGNOSIS — F329 Major depressive disorder, single episode, unspecified: Secondary | ICD-10-CM | POA: Diagnosis not present

## 2015-08-20 DIAGNOSIS — G8918 Other acute postprocedural pain: Secondary | ICD-10-CM | POA: Insufficient documentation

## 2015-08-20 DIAGNOSIS — E119 Type 2 diabetes mellitus without complications: Secondary | ICD-10-CM | POA: Diagnosis not present

## 2015-08-20 DIAGNOSIS — R10819 Abdominal tenderness, unspecified site: Secondary | ICD-10-CM | POA: Diagnosis not present

## 2015-08-20 DIAGNOSIS — I10 Essential (primary) hypertension: Secondary | ICD-10-CM | POA: Insufficient documentation

## 2015-08-20 DIAGNOSIS — Z90710 Acquired absence of both cervix and uterus: Secondary | ICD-10-CM | POA: Insufficient documentation

## 2015-08-20 DIAGNOSIS — F259 Schizoaffective disorder, unspecified: Secondary | ICD-10-CM | POA: Insufficient documentation

## 2015-08-20 DIAGNOSIS — T8189XA Other complications of procedures, not elsewhere classified, initial encounter: Secondary | ICD-10-CM | POA: Diagnosis not present

## 2015-08-20 DIAGNOSIS — IMO0001 Reserved for inherently not codable concepts without codable children: Secondary | ICD-10-CM

## 2015-08-20 DIAGNOSIS — N200 Calculus of kidney: Secondary | ICD-10-CM | POA: Diagnosis not present

## 2015-08-20 DIAGNOSIS — T814XXA Infection following a procedure, initial encounter: Principal | ICD-10-CM

## 2015-08-20 LAB — CBC WITH DIFFERENTIAL/PLATELET
BASOS ABS: 0 10*3/uL (ref 0.0–0.1)
Basophils Relative: 0 %
EOS ABS: 0.2 10*3/uL (ref 0.0–0.7)
EOS PCT: 4 %
HCT: 29.7 % — ABNORMAL LOW (ref 36.0–46.0)
Hemoglobin: 10 g/dL — ABNORMAL LOW (ref 12.0–15.0)
LYMPHS PCT: 7 %
Lymphs Abs: 0.4 10*3/uL — ABNORMAL LOW (ref 0.7–4.0)
MCH: 29.9 pg (ref 26.0–34.0)
MCHC: 33.7 g/dL (ref 30.0–36.0)
MCV: 88.9 fL (ref 78.0–100.0)
MONO ABS: 0.5 10*3/uL (ref 0.1–1.0)
Monocytes Relative: 8 %
Neutro Abs: 4.9 10*3/uL (ref 1.7–7.7)
Neutrophils Relative %: 81 %
PLATELETS: 243 10*3/uL (ref 150–400)
RBC: 3.34 MIL/uL — ABNORMAL LOW (ref 3.87–5.11)
RDW: 15 % (ref 11.5–15.5)
WBC: 6.1 10*3/uL (ref 4.0–10.5)

## 2015-08-20 LAB — URINE CULTURE

## 2015-08-20 MED ORDER — METRONIDAZOLE 500 MG PO TABS: 500.0000 mg | ORAL_TABLET | Freq: Three times a day (TID) | ORAL | Status: DC

## 2015-08-20 MED ORDER — CLINDAMYCIN HCL 300 MG PO CAPS: 300.0000 mg | ORAL_CAPSULE | Freq: Four times a day (QID) | ORAL | Status: DC

## 2015-08-20 MED ORDER — CIPROFLOXACIN HCL 500 MG PO TABS: 500.0000 mg | ORAL_TABLET | Freq: Two times a day (BID) | ORAL | Status: DC

## 2015-08-20 MED ORDER — IBUPROFEN 600 MG PO TABS
600.0000 mg | ORAL_TABLET | Freq: Once | ORAL | Status: AC
  Administered 2015-08-20: 600 mg via ORAL
  Filled 2015-08-20: qty 1

## 2015-08-20 NOTE — MAU Note (Addendum)
Pt had hysterectomy on 08-05-15 and hx of fibroids.  Pt states she was in clinic yesterday for puss coming out of her belly button and given an antibiotic for it.  Started last night.  Pt states she has headache and EMS provider stated her temp was 102.7 during transport.  EMS gave pt 1000mg  of tylenol at 1245. Last pain med at noon as well.  Pt states the sulfa antibiotic made her eyes and lips swollen, difficulty breathing and eyes blood red.  Pt states she took it at noon and then took benadryl afterwards at home.  CBG was 154 by EMS today.

## 2015-08-20 NOTE — MAU Provider Note (Signed)
History     CSN: [id number]  Arrival date and time: 08/20/15 1323   First Provider Initiated Contact with Patient 08/20/15 1355         Chief Complaint  Patient presents with  . Abdominal Pain  . Headache   HPI  [name] is a 44 y.o. female who is s/p hysterectomy on 9/20. Here today via EMS for abdominal pain & headache.  Obtaining history is limited d/t patient learning disability; family not here with patient at this time.  Was seen in clinic yesterday for fever & incisional pain. Was prescribed Bactrim; took dose last night & this morning. After this morning's dose, had allergic reaction (lip swelling, eyes red). Took Benadryl; states doesn't think lips are swollen anymore. Eyes still red, but denies eye pain or change in vision. Denies chest pain or SOB.  Has had fevers for the last several days. Per EMS, her temp was 102.7 for them, so they gave her 1 gm tylenol PO.   Reports worsening abdominal pain since yesterday. Last took pain medication at noon today with mild relief. Currently complains of generalized abdominal pain as constant & sharp. Rates 10/10.  Also reports headache since this morning.  Denies vaginal bleeding or bleeding from incision.    Past Medical History  Diagnosis Date  . 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 Hypertension   . Congenital cerebral palsy ([place])   . Hearing impairment   . Depression   . Anemia   . Endometriosis   . Heart murmur     at birth  . Neuromuscular disorder ([place])     Cerebral palsy- very mild left side of brain  . Schizoaffective disorder [facility])     Past Surgical History  Procedure Laterality Date  . Cesarean section    . Exploratory laparotomy with abdominal mass excision    . Myomectomy N/A 08/05/2015    Procedure: Abdominal Hysterectomy, Bilateral Salpingectomy;  Surgeon: Osborne Oman, MD;  Location: [facility];  Service: Gynecology;  Laterality: N/A;    Family History  Problem Relation Age of  Onset  . Heart failure Mother   . Hypertension Mother   . Diabetes Mellitus II Mother   . Cancer Mother   . Diabetes Mellitus II Father   . Hypertension Father     Social History  Substance Use Topics  . Smoking status: Never Smoker   . Smokeless tobacco: Never Used  . Alcohol Use: No    Allergies:  Allergies  Allergen Reactions  . Bactrim [Sulfamethoxazole-Trimethoprim] Anaphylaxis, Shortness Of Breath and Swelling  . Ivp Dye [Iodinated Diagnostic Agents] Nausea And Vomiting  . Pollen Extract     Seasonal allergies  . Sulfa Antibiotics Swelling    Eyes and lips swelled up    Prescriptions prior to admission  Medication Sig Dispense Refill Last Dose  . cetirizine (ZYRTEC) 10 MG tablet Take 30 mg by mouth daily.   Taking  . clindamycin (CLEOCIN) 300 MG capsule Take 1 capsule (300 mg total) by mouth 4 (four) times daily. 40 capsule 0   . cyanocobalamin 1000 MCG tablet TAKE ONE (1) TABLET BY MOUTH EVERY DAY 90 tablet 1 Taking  . docusate sodium (COLACE) 100 MG capsule Take 1 capsule (100 mg total) by mouth 2 (two) times daily as needed. 30 capsule 2 Taking  . ferrous sulfate 325 (65 FE) MG tablet Take 1 tablet (325 mg total) by mouth 3 (three) times daily with meals. 90 tablet 3 Taking  .  ibuprofen (ADVIL,MOTRIN) 600 MG tablet Take 1 tablet (600 mg total) by mouth every 6 (six) hours as needed for headache, mild pain, moderate pain or cramping. 60 tablet 3 Taking  . lisinopril-hydrochlorothiazide (PRINZIDE,ZESTORETIC) 10-12.5 MG per tablet Take 1 tablet by mouth daily. 30 tablet 6 08/05/2015 at 0730  . metFORMIN (GLUCOPHAGE) 500 MG tablet Take 1 tablet (500 mg total) by mouth 2 (two) times daily with a meal. 60 tablet 5 Taking  . metoprolol succinate (TOPROL-XL) 25 MG 24 hr tablet Take 1 tablet (25 mg total) by mouth daily. 90 tablet 3 Taking  . oxyCODONE-acetaminophen (PERCOCET/ROXICET) 5-325 MG per tablet Take 1-2 tablets by mouth every 4 (four) hours as needed for severe pain  (moderate to severe pain (when tolerating fluids)). 60 tablet 0   . pravastatin (PRAVACHOL) 40 MG tablet Take 1 tablet (40 mg total) by mouth daily. 90 tablet 3 Taking  . venlafaxine XR (EFFEXOR-XR) 75 MG 24 hr capsule Take 75-150 mg by mouth 2 (two) times daily. TAKE ONE CAPSULE BY MOUTH IN THE MORNING AND TWO AT BEDTIME per patient   Taking    Review of Systems  Constitutional: Positive for fever and chills.  Eyes: Positive for redness. Negative for blurred vision, pain and discharge.  Respiratory: Negative.   Cardiovascular: Negative.   Gastrointestinal: Positive for abdominal pain. Negative for nausea, vomiting, diarrhea and constipation.  Genitourinary: Negative.   Neurological: Positive for headaches.   Physical Exam   Blood pressure 125/61, pulse 94, temperature 97.9 F (36.6 C), temperature source Oral, resp. rate 20, last menstrual period 07/23/2015, SpO2 100 %.  Physical Exam  Nursing note and vitals reviewed. Constitutional: She is oriented to person, place, and time. She appears well-developed and well-nourished. No distress.  HENT:  Head: Normocephalic and atraumatic.  Eyes: Right eye exhibits no discharge. Left eye exhibits no discharge. Right conjunctiva is injected. Left conjunctiva is injected. No scleral icterus.  Neck: Normal range of motion.  Cardiovascular: Normal rate, regular rhythm and normal heart sounds.   No murmur heard. Respiratory: Effort normal and breath sounds normal. No respiratory distress. She has no wheezes.  GI: Soft. Bowel sounds are normal. There is generalized tenderness. There is no rebound.  Abdominal incision well approximated. No erythema.   Neurological: She is alert and oriented to person, place, and time.  Skin: Skin is warm and dry. She is not diaphoretic.    MAU Course  Procedures Results for orders placed or performed during the hospital encounter of 08/20/15 (from the past 24 hour(s))  CBC with Differential     Status: Abnormal    Collection Time: 08/20/15  2:10 PM  Result Value Ref Range   WBC 6.1 4.0 - 10.5 K/uL   RBC 3.34 (L) 3.87 - 5.11 MIL/uL   Hemoglobin 10.0 (L) 12.0 - 15.0 g/dL   HCT 29.7 (L) 36.0 - 46.0 %   MCV 88.9 78.0 - 100.0 fL   MCH 29.9 26.0 - 34.0 pg   MCHC 33.7 30.0 - 36.0 g/dL   RDW 15.0 11.5 - 15.5 %   Platelets 243 150 - 400 K/uL   Neutrophils Relative % 81 %   Neutro Abs 4.9 1.7 - 7.7 K/uL   Lymphocytes Relative 7 %   Lymphs Abs 0.4 (L) 0.7 - 4.0 K/uL   Monocytes Relative 8 %   Monocytes Absolute 0.5 0.1 - 1.0 K/uL   Eosinophils Relative 4 %   Eosinophils Absolute 0.2 0.0 - 0.7 K/uL   Basophils Relative 0 %  Basophils Absolute 0.0 0.0 - 0.1 K/uL   Ct Abdomen Pelvis Wo Contrast  08/20/2015   CLINICAL DATA:  Initial encounter for two-day history pelvic pain and fever status post hysterectomy and bilateral salpingectomy on 08/05/2015. IV contrast was withheld secondary to contrast allergy.  EXAM: CT ABDOMEN AND PELVIS WITHOUT CONTRAST  TECHNIQUE: Multidetector CT imaging of the abdomen and pelvis was performed following the standard protocol without IV contrast.  COMPARISON:  08/27/2010.  Abdomen only CT from 11/26/2011.  FINDINGS: Lower chest:  Unremarkable.  Hepatobiliary: No focal abnormality in the liver on this study without intravenous contrast. No evidence of hepatomegaly. Gallbladder is decompressed. No intrahepatic or extrahepatic biliary dilation.  Pancreas: No focal mass lesion. No dilatation of the main duct. No intraparenchymal cyst. No peripancreatic edema.  Spleen: No splenomegaly. No focal mass lesion.  Adrenals/Urinary Tract: No adrenal nodule or mass. 1-2 mm nonobstructing stone is seen in the upper pole right kidney. No evidence for stones in the left kidney. No ureteral or bladder stones. No hydronephrosis.  Stomach/Bowel: Stomach is nondistended. No gastric wall thickening. No evidence of outlet obstruction. Duodenum is normally positioned as is the ligament of Treitz. No small  bowel wall thickening. No small bowel dilatation. The terminal ileum is normal. The appendix is normal. No gross colonic mass. No colonic wall thickening. No substantial diverticular change.  Vascular/Lymphatic: No abdominal aortic aneurysm. There is no gastrohepatic or hepatoduodenal ligament lymphadenopathy. No intraperitoneal or retroperitoneal lymphadenopy. No pelvic sidewall lymphadenopathy.  Reproductive: Uterus is surgically absent by report. There is a uterine shaped collection of tissue/debris/fluid the tracks from the vaginal cuff over the left bladder dome and into the left adnexal space. This collection measures 5.0 x 10.3 x 5.9 cm. Left ovary cannot be discriminated as separate from this process.  5.4 x 3.6 x 4.2 cm collection of fluid/soft tissue/debris identified in the right adnexal space. The right ovary cannot be discriminated as distinct from this process.  Other: No intraperitoneal free fluid.  Musculoskeletal: Stranding in the prevesical space and in the lower anterior abdominal wall is compatible with recent surgery and low transverse incision. Bone windows reveal no worrisome lytic or sclerotic osseous lesions.  IMPRESSION: 1. Bilateral areas of soft tissue attenuation in the adnexal regions, left greater than right. Assessment is limited by the lack of intravenous contrast material. The ovaries cannot be discriminated as separate from these findings. The process on the right may well simply represent the right ovary. The process on the left could represent a combination of ovary and/or hematoma and/or loculated fluid. Left adnexal abscess cannot be excluded. Ultrasound exam may prove helpful to further evaluate. Alternatively MRI without and with contrast may prove helpful to further assess given the patient's reported iodinated contrast allergy. 2. 1-2 mm nonobstructing right renal stone.   Electronically Signed   By: Misty Stanley M.D.   On: 08/20/2015 16:41    MDM Pain improved with  ibuprofen PO C/w Dr. Elly Modena; order CT abdomen Discussed CT results with Dr. Elly Modena. Ok to discharge home. Rx cipro & flagyl x 7 days. Keep f/u with Dr. Harolyn Rutherford Assessment and Plan  A: 1. Postoperative fever   2. Postoperative pain    P: Discharge home Rx cipro & flagyl Do not pick up clinda rx Keep f/u with Dr. Harolyn Rutherford Call Clinic if symptoms don't improve or fever continues  Jorje Guild, NP  08/20/2015, 1:53 PM

## 2015-08-20 NOTE — Telephone Encounter (Addendum)
Patient called and left message stating the doctor yesterday gave her an antibiotic and this morning when she took it her eyes got puffy and her lips swelled up. Patient calling and requesting alternative medication. Spoke to Dr Si Raider who switched medication to clindamycin 300mg  QID x 10 days. Called patient and when she answered the phone she was coughing and sounded hoarse. Told patient I was returning her call and that she needs to stop that antibiotic and go to the hospital right now. Patient states her mom isn't home because she is taking her uncle to the ER but she will get her to do that when she gets home. Told her she needs to call 911 right now and have them take her. Patient states she cant because she doesn't have a key to the house. Told her that did not matter and she needed to go to the hospital right now. Patient states she needs to take a shower first. Told her she needs to call 911 right now and offered to call. Patient verbalized understanding and states she knows how to call. Told her I would try to reach her mother and would call her cousin as well. Patient verbalized understanding and I urged her to call 911 right now. Patient verbalized understanding. Called patient's mother at [phone] (number was provided by patient), no answer. Called patient's emergency contact(cousin) and explained urgency of situation and that the patient needed to go to the hospital right now. Patient's cousin verbalized understanding and states she will also call [place]. Patient's cousin had no questions. New medication sent to pharmacy and allergy list updated.

## 2015-08-20 NOTE — Discharge Instructions (Signed)
Fever, Adult A fever is an increase in the body's temperature. It is often defined as a temperature of 100 F (38C) or higher. Short mild or moderate fevers often have no long-term effects. They also often do not need treatment. Moderate or high fevers may make you feel uncomfortable. Sometimes, they can also be a sign of a serious illness or disease. The sweating that may happen with repeated fevers or fevers that last a while may also cause you to not have enough fluid in your body (dehydration). You can take your temperature with a thermometer to see if you have a fever. A measured temperature can change with:  Age.  Time of day.  Where the thermometer is placed:  Mouth (oral).  Rectum (rectal).  Ear (tympanic).  Underarm (axillary).  Forehead (temporal). HOME CARE Pay attention to any changes in your symptoms. Take these actions to help with your condition:  Take over-the-counter and prescription medicines only as told by your doctor. Follow the dosing instructions carefully.  If you were prescribed an antibiotic medicine, take it as told by your doctor. Do not stop taking the antibiotic even if you start to feel better.  Rest as needed.  Drink enough fluid to keep your pee (urine) clear or pale yellow.  Sponge yourself or bathe with room-temperature water as needed. This helps to lower your body temperature . Do not use ice water.  Do not wear too many blankets or heavy clothes. GET HELP IF:  You throw up (vomit).  You cannot eat or drink without throwing up.  You have watery poop (diarrhea).  It hurts when you pee.  Your symptoms do not get better with treatment.  You have new symptoms.  You feel very weak. GET HELP RIGHT AWAY IF:  You are short of breath or have trouble breathing.  You are dizzy or you pass out (faint).  You feel confused.  You have signs of not having enough fluid in your body, such as:  A dry mouth.  Peeing less.  Looking  pale.  You have very bad pain in your belly (abdomen).  You keep throwing up or having water poop.  You have a skin rash.  Your symptoms suddenly get worse.   This information is not intended to replace advice given to you by your health care provider. Make sure you discuss any questions you have with your health care provider.   Document Released: 08/10/2008 Document Revised: 07/23/2015 Document Reviewed: 12/26/2014 Elsevier Interactive Patient Education 2016 [place].     Abdominal Hysterectomy, Care After These instructions give you information on caring for yourself after your procedure. Your doctor may also give you more specific instructions. Call your doctor if you have any problems or questions after your procedure.  HOME CARE It takes 4-6 weeks to recover from this surgery. Follow all of your doctor's instructions.   Only take medicines as told by your doctor.  Change your bandage as told by your doctor.  Return to your doctor to have your stitches taken out.  Take showers for 2-3 weeks. Ask your doctor when it is okay to shower.  Do not douche, use tampons, or have sex (intercourse) for at least 6 weeks or as told.  Follow your doctor's advice about exercise, lifting objects, driving, and general activities.  Get plenty of rest and sleep.  Do not lift anything heavier than a gallon of milk (about 10 pounds [4.5 kilograms]) for the first month after surgery.  Get back  to your normal diet as told by your doctor.  Do not drink alcohol until your doctor says it is okay.  Take a medicine to help you poop (laxative) as told by your doctor.  Eating foods high in fiber may help you poop. Eat a lot of raw fruits and vegetables, whole grains, and beans.  Drink enough fluids to keep your pee (urine) clear or pale yellow.  Have someone help you at home for 1-2 weeks after your surgery.  Keep follow-up doctor visits as told. GET HELP IF:  You have chills or  fever.  You have puffiness, redness, or pain in area of the cut (incision).  You have yellowish-white fluid (pus) coming from the cut.  You have a bad smell coming from the cut or bandage.  Your cut pulls apart.  You feel dizzy or light-headed.  You have pain or bleeding when you pee.  You keep having watery poop (diarrhea).  You keep feeling sick to your stomach (nauseous) or keep throwing up (vomiting).  You have fluid (discharge) coming from your vagina.  You have a rash.  You have a reaction to your medicine.  You need stronger pain medicine. GET HELP RIGHT AWAY IF:   You have a fever and your symptoms suddenly get worse.  You have bad belly (abdominal) pain.  You have chest pain.  You are short of breath.  You pass out (faint).  You have pain, puffiness, or redness of your leg.  You bleed a lot from your vagina and notice clumps of tissue (clots). MAKE SURE YOU:   Understand these instructions.  Will watch your condition.  Will get help right away if you are not doing well or get worse.   This information is not intended to replace advice given to you by your health care provider. Make sure you discuss any questions you have with your health care provider.   Document Released: 08/10/2008 Document Revised: 11/06/2013 Document Reviewed: 08/24/2013 Elsevier Interactive Patient Education [employer].

## 2015-09-16 ENCOUNTER — Encounter: Payer: Self-pay | Admitting: Internal Medicine

## 2015-09-16 ENCOUNTER — Ambulatory Visit (INDEPENDENT_AMBULATORY_CARE_PROVIDER_SITE_OTHER): Payer: Medicare Other | Admitting: Internal Medicine

## 2015-09-16 VITALS — BP 113/71 | HR 99 | Temp 97.9°F | Ht 64.0 in | Wt 205.5 lb

## 2015-09-16 DIAGNOSIS — R110 Nausea: Secondary | ICD-10-CM | POA: Diagnosis not present

## 2015-09-16 DIAGNOSIS — E119 Type 2 diabetes mellitus without complications: Secondary | ICD-10-CM

## 2015-09-16 DIAGNOSIS — E041 Nontoxic single thyroid nodule: Secondary | ICD-10-CM | POA: Diagnosis not present

## 2015-09-16 DIAGNOSIS — I10 Essential (primary) hypertension: Secondary | ICD-10-CM

## 2015-09-16 DIAGNOSIS — F32A Depression, unspecified: Secondary | ICD-10-CM

## 2015-09-16 DIAGNOSIS — F329 Major depressive disorder, single episode, unspecified: Secondary | ICD-10-CM | POA: Diagnosis not present

## 2015-09-16 DIAGNOSIS — Z0000 Encounter for general adult medical examination without abnormal findings: Secondary | ICD-10-CM | POA: Diagnosis not present

## 2015-09-16 DIAGNOSIS — D500 Iron deficiency anemia secondary to blood loss (chronic): Secondary | ICD-10-CM

## 2015-09-16 DIAGNOSIS — R1031 Right lower quadrant pain: Secondary | ICD-10-CM | POA: Diagnosis not present

## 2015-09-16 LAB — POCT GLYCOSYLATED HEMOGLOBIN (HGB A1C): HEMOGLOBIN A1C: 6.1

## 2015-09-16 LAB — GLUCOSE, CAPILLARY: GLUCOSE-CAPILLARY: 114 mg/dL — AB (ref 65–99)

## 2015-09-16 MED ORDER — PROMETHAZINE HCL 12.5 MG PO TABS: 12.5000 mg | ORAL_TABLET | Freq: Four times a day (QID) | ORAL | Status: DC | PRN

## 2015-09-16 MED ORDER — CYANOCOBALAMIN 1000 MCG PO TABS: ORAL_TABLET | ORAL | Status: DC

## 2015-09-16 NOTE — Patient Instructions (Signed)
-   We will get an ultrasound of your abdominal and a transvaginal ultrasound - Checking blood work today - Can take Phenergan 12.5 mg every 6 hours AS NEEDED for nausea - Follow up in 2 weeks to see how your nausea/pain is doing - If you develop fevers, chills, worsening abdominal pain, or vomiting please come back to the clinic or emergency room sooner  General Instructions:   Please bring your medicines with you each time you come to clinic.  Medicines may include prescription medications, over-the-counter medications, herbal remedies, eye drops, vitamins, or other pills.   Progress Toward Treatment Goals:  Treatment Goal 03/13/2015  Hemoglobin A1C at goal  Blood pressure at goal    Self Care Goals & Plans:  Self Care Goal 03/13/2015  Manage my medications take my medicines as prescribed; bring my medications to every visit; refill my medications on time  Monitor my health keep track of my blood glucose; bring my glucose meter and log to each visit; check my feet daily  Eat healthy foods drink diet soda or water instead of juice or soda; eat more vegetables; eat foods that are low in salt; eat baked foods instead of fried foods; eat smaller portions  Be physically active find an activity I enjoy; take a walk every day  Meeting treatment goals -    Home Blood Glucose Monitoring 03/13/2015  Check my blood sugar once a day  When to check my blood sugar before breakfast     Care Management & Community Referrals:  Referral 07/30/2014  Referrals made for care management support none needed

## 2015-09-17 LAB — CBC WITH DIFFERENTIAL/PLATELET
Basophils Absolute: 0 10*3/uL (ref 0.0–0.2)
Basos: 0 %
EOS (ABSOLUTE): 0.3 10*3/uL (ref 0.0–0.4)
EOS: 4 %
HEMOGLOBIN: 13.8 g/dL (ref 11.1–15.9)
Hematocrit: 41.1 % (ref 34.0–46.6)
IMMATURE GRANULOCYTES: 0 %
Immature Grans (Abs): 0 10*3/uL (ref 0.0–0.1)
LYMPHS ABS: 2.3 10*3/uL (ref 0.7–3.1)
Lymphs: 30 %
MCH: 29.7 pg (ref 26.6–33.0)
MCHC: 33.6 g/dL (ref 31.5–35.7)
MCV: 88 fL (ref 79–97)
MONOS ABS: 0.7 10*3/uL (ref 0.1–0.9)
Monocytes: 9 %
NEUTROS PCT: 57 %
Neutrophils Absolute: 4.3 10*3/uL (ref 1.4–7.0)
PLATELETS: 289 10*3/uL (ref 150–379)
RBC: 4.65 x10E6/uL (ref 3.77–5.28)
RDW: 13.9 % (ref 12.3–15.4)
WBC: 7.5 10*3/uL (ref 3.4–10.8)

## 2015-09-17 LAB — MICROALBUMIN / CREATININE URINE RATIO
CREATININE, UR: 82.7 mg/dL
MICROALB/CREAT RATIO: 21.3 mg/g{creat} (ref 0.0–30.0)
Microalbumin, Urine: 17.6 ug/mL

## 2015-09-17 NOTE — Progress Notes (Signed)
   Subjective:    Patient ID: [name], female    DOB: 04-05-1971, 44 y.o.   MRN: [id number]  HPI [name] is a 44yo woman with PMHx of HTN, type 2 DM, and depression who presents today for ongoing nausea and follow up of her chronic medical conditions as listed below.  Nausea: She reports her nausea started about 1 week ago. She describes feeling the sensation to vomit, but denies actual vomiting. She also has associated "hot sweats." She denies fevers, chills, abdominal pain, diarrhea, and constipation. She recently underwent a hysterectomy with bilateral salpingectomy for her dysfunctional uterine bleeding secondary to multiple fibroids on 08/05/15. She had a follow up appointment with OBGYN on 10/4 and was complaining of fevers, chills, and drainage from her incision sites. She was prescribed a course of Bactrim BID for 10 days which she states she completed. She then went to the ED on 10/5 for similar symptoms and was given prescriptions for Flagyl and Cipro which she did not take. She was supposed to follow up with Dr. Harolyn Rutherford on 10/12 but never did.   HTN: BP well controlled at 113/71 today. She is on Lisinopril-HCTZ 10-12.5 mg daily and Toprol-XL 25 mg daily.   Type 2 DM: HbA1c 6.1 today. She is taking Metformin 500 mg BID. She denies any blurry vision, polyuria, or polydipsia.   Thyroid Nodule: She has hx of thyroid nodules initially found on a CT in 2011. Most recent US on 11/06/14 showed bilateral nodules with a dominant right lower pole nodule measuring 2.3 cm. She was scheduled to undergo FNA with Endo on 03/15/15 but then missed the appointment. She reports she was scared to have the procedure done due to her "neck getting messed up." Last TSH was normal at 1.4 in September 2015. She denies any heat/cold intolerance, anxiety, changes in weight, or changes in hair, nails, or skin.   Depression: Her depression is well controlled with Venlafaxine 75 mg daily.   Anemia Due to Chronic  Blood Loss: Secondary to multiple fibroids for which she recently had a hysterectomy with bilateral salpingectomy on 08/05/15. Her last Hgb was stable at 10.0 on 08/20/15. She reports taking iron supplementation.   Review of Systems General: Denies night sweats HEENT: Denies headaches, ear pain, changes in vision, rhinorrhea, sore throat CV: Denies CP, palpitations, SOB, orthopnea Pulm: Denies SOB, cough, wheezing GI: See HPI GU: Denies dysuria, hematuria, frequency Msk: Denies muscle cramps, joint pains Neuro: Denies weakness, numbness, tingling Skin: Denies rashes, bruising Psych: Denies hallucinations    Objective:   Physical Exam General: obese woman sitting up, NAD HEENT: [place]/AT, EOMI, sclera anicteric, mucus membranes moist CV: RRR, no m/g/r Pulm: CTA bilaterally, breaths non-labored Abd: BS+, soft, obese. Significant tenderness in the RLQ and mild tenderness in the RUQ. There is a small area in the RLQ that feels similar to a hernia. She is tender in this region.  Ext: warm, no peripheral edema Neuro: alert and oriented x 3    Assessment & Plan:  Please refer to A&P documentation.

## 2015-09-18 ENCOUNTER — Ambulatory Visit: Payer: Medicare Other | Admitting: Obstetrics & Gynecology

## 2015-09-18 DIAGNOSIS — R110 Nausea: Secondary | ICD-10-CM | POA: Insufficient documentation

## 2015-09-18 NOTE — Assessment & Plan Note (Signed)
Needs mammogram. Will make referral at next visit.

## 2015-09-18 NOTE — Assessment & Plan Note (Signed)
Secondary to her multiple fibroids which have now been removed with hysterectomy on 08/05/15. Likely will have not have issues with anemia now. Can discuss stopping iron supplementation at next visit pending CBC recheck today. - Check CBC>> Hgb stable at 13.8

## 2015-09-18 NOTE — Assessment & Plan Note (Signed)
Continue Venlafaxine

## 2015-09-18 NOTE — Assessment & Plan Note (Addendum)
Lab Results  Component Value Date   HGBA1C 6.1 09/16/2015   HGBA1C 5.8 03/13/2015   HGBA1C 6.1* 03/04/2015     Assessment: Diabetes control: good control (HgbA1C at goal) Progress toward A1C goal:  at goal Comments: Well controlled.  Plan: Medications:  Continue Metformin 500 mg BID Home glucose monitoring: Frequency: once a day Timing: before breakfast Instruction/counseling given: Reminded to bring glucometer and medications to every visit. Other plans:  - Check urine microalbumin today>> increased from 5.9 to 21. On ACE. - Foot exam today - Discuss eye exam next visit

## 2015-09-18 NOTE — Assessment & Plan Note (Signed)
BP Readings from Last 3 Encounters:  09/16/15 113/71  08/20/15 99/64  08/19/15 130/71    Lab Results  Component Value Date   NA 139 07/30/2015   K 3.6 07/30/2015   CREATININE 0.70 07/30/2015    Assessment: Blood pressure control: controlled Progress toward BP goal:  at goal Comments: Well controlled.  Plan: Medications:  Continue Lisinopril-HCTZ 10-12.5 mg daily and Toprol-XL 25 mg daily.

## 2015-09-18 NOTE — Assessment & Plan Note (Signed)
Patient with hx of recent surgery (hysterectomy) so there is concern for post-op infection with her persistent nausea and abdominal pain noted on exam. She did have a post-op visit with her GYN and was treated for post-op infection earlier this month with Bactrim. She is not febrile and other vitals stable but she does have significant tenderness on exam. Other possibilities include constipation (reported normal BMs, eating.drinking well), pregnancy (just had hysterectomy), GERD, pancreatitis (denies alcohol use), UTI (denies urinary symptoms). She had a CT abd on 10/5 which showed a "process on the left could represent a combination of ovary and/or hematoma and/or loculated fluid. Left adnexal abscess cannot be excluded." Will check CBC and get US of the abdomen for further evaluation. - Check CBC>> WBC count normal t 7.5 - Abdominal US - Phenergan PRN for nausea - Recommended to follow up if symptoms worsen or if she develops fevers, worsening abd pain, or vomiting

## 2015-09-18 NOTE — Assessment & Plan Note (Signed)
Discussed importance of getting FNA with patient. She understands that this procedure will not cause disfigurement of her neck after our discussion. Will have her see endocrinologist.  - Referral to endocrinology

## 2015-09-19 ENCOUNTER — Encounter (HOSPITAL_COMMUNITY): Payer: Self-pay | Admitting: *Deleted

## 2015-09-19 ENCOUNTER — Emergency Department (HOSPITAL_COMMUNITY)
Admission: EM | Admit: 2015-09-19 | Discharge: 2015-09-19 | Disposition: A | Discharge status: Home / Self Care | Payer: No Typology Code available for payment source | Attending: Emergency Medicine | Admitting: Emergency Medicine

## 2015-09-19 DIAGNOSIS — S4992XA Unspecified injury of left shoulder and upper arm, initial encounter: Secondary | ICD-10-CM | POA: Insufficient documentation

## 2015-09-19 DIAGNOSIS — D649 Anemia, unspecified: Secondary | ICD-10-CM | POA: Diagnosis not present

## 2015-09-19 DIAGNOSIS — M25512 Pain in left shoulder: Secondary | ICD-10-CM | POA: Diagnosis not present

## 2015-09-19 DIAGNOSIS — E119 Type 2 diabetes mellitus without complications: Secondary | ICD-10-CM | POA: Diagnosis not present

## 2015-09-19 DIAGNOSIS — S299XXA Unspecified injury of thorax, initial encounter: Secondary | ICD-10-CM | POA: Insufficient documentation

## 2015-09-19 DIAGNOSIS — Y9389 Activity, other specified: Secondary | ICD-10-CM | POA: Diagnosis not present

## 2015-09-19 DIAGNOSIS — I10 Essential (primary) hypertension: Secondary | ICD-10-CM | POA: Insufficient documentation

## 2015-09-19 DIAGNOSIS — S0990XA Unspecified injury of head, initial encounter: Secondary | ICD-10-CM | POA: Diagnosis not present

## 2015-09-19 DIAGNOSIS — Y998 Other external cause status: Secondary | ICD-10-CM | POA: Diagnosis not present

## 2015-09-19 DIAGNOSIS — F329 Major depressive disorder, single episode, unspecified: Secondary | ICD-10-CM | POA: Diagnosis not present

## 2015-09-19 DIAGNOSIS — Z79899 Other long term (current) drug therapy: Secondary | ICD-10-CM | POA: Diagnosis not present

## 2015-09-19 DIAGNOSIS — R011 Cardiac murmur, unspecified: Secondary | ICD-10-CM | POA: Diagnosis not present

## 2015-09-19 DIAGNOSIS — Y92410 Unspecified street and highway as the place of occurrence of the external cause: Secondary | ICD-10-CM | POA: Diagnosis not present

## 2015-09-19 MED ORDER — CYCLOBENZAPRINE HCL 10 MG PO TABS: 10.0000 mg | ORAL_TABLET | Freq: Three times a day (TID) | ORAL | Status: DC | PRN

## 2015-09-19 MED ORDER — IBUPROFEN 400 MG PO TABS
600.0000 mg | ORAL_TABLET | Freq: Once | ORAL | Status: AC
  Administered 2015-09-19: 600 mg via ORAL
  Filled 2015-09-19: qty 2

## 2015-09-19 MED ORDER — IBUPROFEN 800 MG PO TABS: 800.0000 mg | ORAL_TABLET | Freq: Three times a day (TID) | ORAL | Status: DC | PRN
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
## 2015-09-22 NOTE — Progress Notes (Signed)
Internal Medicine Clinic Attending  Case discussed with Dr. Rivet soon after the resident saw the patient.  We reviewed the resident's history and exam and pertinent patient test results.  I agree with the assessment, diagnosis, and plan of care documented in the resident's note.  

## 2015-09-29 ENCOUNTER — Ambulatory Visit: Payer: Medicare Other | Admitting: Advanced Practice Midwife

## 2015-09-30 ENCOUNTER — Encounter: Payer: Self-pay | Admitting: Internal Medicine

## 2015-10-06 ENCOUNTER — Ambulatory Visit (HOSPITAL_COMMUNITY): Payer: Medicare Other

## 2015-10-06 ENCOUNTER — Other Ambulatory Visit: Payer: Self-pay | Admitting: Internal Medicine

## 2015-10-06 DIAGNOSIS — R110 Nausea: Secondary | ICD-10-CM

## 2015-10-13 ENCOUNTER — Telehealth: Payer: Self-pay | Admitting: Internal Medicine

## 2015-10-13 NOTE — Telephone Encounter (Signed)
NEEDS RESULTS FROM LAB WORK

## 2015-10-13 NOTE — Telephone Encounter (Signed)
Attempt to call pt back, phone rings into a busy signal

## 2015-10-13 NOTE — Telephone Encounter (Signed)
Spoke with pt, advised all lab results done on 11/1 resulted as normal. Pt plans to come for scheduled abdominal ultrasound as planned.

## 2015-10-23 NOTE — Telephone Encounter (Signed)
Opened in Error.

## 2015-10-24 ENCOUNTER — Ambulatory Visit (HOSPITAL_COMMUNITY): Payer: Medicare Other

## 2015-10-24 ENCOUNTER — Other Ambulatory Visit (HOSPITAL_COMMUNITY): Payer: Medicare Other

## 2015-11-04 ENCOUNTER — Other Ambulatory Visit: Payer: Self-pay | Admitting: Pulmonary Disease

## 2015-11-20 ENCOUNTER — Emergency Department (INDEPENDENT_AMBULATORY_CARE_PROVIDER_SITE_OTHER)
Admission: EM | Admit: 2015-11-20 | Discharge: 2015-11-20 | Disposition: A | Discharge status: Home / Self Care | Payer: Medicare Other | Source: Home / Self Care | Attending: Family Medicine | Admitting: Family Medicine

## 2015-11-20 ENCOUNTER — Encounter (HOSPITAL_COMMUNITY): Payer: Self-pay | Admitting: Emergency Medicine

## 2015-11-20 DIAGNOSIS — N83201 Unspecified ovarian cyst, right side: Secondary | ICD-10-CM | POA: Diagnosis not present

## 2015-11-20 NOTE — ED Provider Notes (Signed)
CSN: [id number]     Arrival date & time 11/20/15  1541 History   First MD Initiated Contact with Patient 11/20/15 1723     Chief Complaint  Patient presents with  . Abdominal Pain   (Consider location/radiation/quality/duration/timing/severity/associated sxs/prior Treatment) HPI 45 year old female with right lower quadrant pain for several weeks now. She states that the pain is episodic and self resolves itself. Patient has had a hysterectomy with bilateral salpingectomy. Patient states hysterectomy was because he had large fibroids. Past Medical History  Diagnosis Date  . Diabetes mellitus due to abnormal insulin ([place])   . Hypertension   . Congenital cerebral palsy ([place])   . Hearing impairment   . Depression   . Anemia   . Endometriosis   . Heart murmur     at birth  . Neuromuscular disorder ([place])     Cerebral palsy- very mild left side of brain  . Schizoaffective disorder [facility])    Past Surgical History  Procedure Laterality Date  . Cesarean section    . Exploratory laparotomy with abdominal mass excision    . Myomectomy N/A 08/05/2015    Procedure: Abdominal Hysterectomy, Bilateral Salpingectomy;  Surgeon: Osborne Oman, MD;  Location: [facility];  Service: Gynecology;  Laterality: N/A;   Family History  Problem Relation Age of Onset  . Heart failure Mother   . Hypertension Mother   . Diabetes Mellitus II Mother   . Cancer Mother   . Diabetes Mellitus II Father   . Hypertension Father    Social History  Substance Use Topics  . Smoking status: Never Smoker   . Smokeless tobacco: Never Used  . Alcohol Use: No   OB History    Gravida Para Term Preterm AB TAB SAB Ectopic Multiple Living   1 1 1       1      Review of Systems ROS +'ve right lower quadrant pain  Denies: HEADACHE, NAUSEA,  CHEST PAIN, CONGESTION, DYSURIA, SHORTNESS OF BREATH  Allergies  Bactrim; Ivp dye; Pollen extract; and Sulfa antibiotics  Home Medications   Prior to Admission medications    Medication Sig Start Date End Date Taking? Authorizing Provider  lisinopril-hydrochlorothiazide (PRINZIDE,ZESTORETIC) 10-12.5 MG per tablet Take 1 tablet by mouth daily. 05/21/15  Yes Burgess Estelle, MD  cetirizine (ZYRTEC) 10 MG tablet Take 10 mg by mouth daily as needed for allergies.     Historical Provider, MD  cyanocobalamin 1000 MCG tablet TAKE ONE (1) TABLET BY MOUTH EVERY DAY 09/16/15   Juliet Rude, MD  cyclobenzaprine (FLEXERIL) 10 MG tablet Take 1 tablet (10 mg total) by mouth 3 (three) times daily as needed for muscle spasms. 09/19/15   Clayton Bibles, PA-C  ferrous sulfate 325 (65 FE) MG tablet TAKE ONE (1) TABLET BY MOUTH 3 TIMES DAILY WITH MEALS 11/04/15   Juliet Rude, MD  ibuprofen (ADVIL,MOTRIN) 800 MG tablet Take 1 tablet (800 mg total) by mouth every 8 (eight) hours as needed for mild pain or moderate pain. 09/19/15   Clayton Bibles, PA-C  metFORMIN (GLUCOPHAGE) 500 MG tablet Take 1 tablet (500 mg total) by mouth 2 (two) times daily with a meal. 06/23/15   Bartholomew Crews, MD  metoprolol succinate (TOPROL-XL) 25 MG 24 hr tablet Take 1 tablet (25 mg total) by mouth daily. 08/18/15   Juliet Rude, MD  pravastatin (PRAVACHOL) 40 MG tablet Take 1 tablet (40 mg total) by mouth daily. 08/18/15   Carly Montey Hora, MD  promethazine (PHENERGAN) 12.5  MG tablet Take 1 tablet (12.5 mg total) by mouth every 6 (six) hours as needed for nausea or vomiting. 09/16/15   Juliet Rude, MD  venlafaxine XR (EFFEXOR-XR) 75 MG 24 hr capsule Take 75-150 mg by mouth 2 (two) times daily. TAKE ONE CAPSULE BY MOUTH IN THE MORNING AND TWO AT BEDTIME per patient 02/03/12   Yaritzi Craun Jupiter, PA-C   Meds Ordered and Administered this Visit  Medications - No data to display  BP 135/78 mmHg  Pulse 90  Temp(Src) 98.1 F (36.7 C) (Oral)  Resp 18  SpO2 98%  LMP 07/23/2015 (Exact Date) No data found.   Physical Exam  Constitutional: She is oriented to person, place, and time. She appears well-developed and well-nourished. No  distress.  HENT:  Head: Normocephalic and atraumatic.  Right Ear: External ear normal.  Left Ear: External ear normal.  Eyes: Conjunctivae are normal.  Cardiovascular: Normal rate.   Pulmonary/Chest: Effort normal and breath sounds normal.  Abdominal: Soft. There is no tenderness. There is no rebound and no guarding.  Musculoskeletal: Normal range of motion.  Neurological: She is alert and oriented to person, place, and time.  Skin: Skin is warm and dry.  Psychiatric: She has a normal mood and affect. Her behavior is normal. Judgment and thought content normal.    ED Course  Procedures (including critical care time)  Labs Review Labs Reviewed - No data to display  Imaging Review No results found.   Visual Acuity Review  Right Eye Distance:   Left Eye Distance:   Bilateral Distance:    Right Eye Near:   Left Eye Near:    Bilateral Near:         MDM   1. Cyst of right ovary     Patient is advised to follow-up with her primary care provider. Two-week history of right lower quadrant pain and yield of the diagnosis of appendicitis is quite low at this time. She may need to have a pelvic ultrasound completed. Tylenol ibuprofen for discomfort. Refer back to primary care structures or care provided discharged home in stable condition    Konrad Felix, [place] 11/20/15 1931

## 2015-11-20 NOTE — Discharge Instructions (Signed)
Ovarian Cyst  An ovarian cyst is a sac filled with fluid or blood. This sac is attached to the ovary. Some cysts go away on their own. Other cysts need treatment.   HOME CARE   · Only take medicine as told by your doctor.  · Follow up with your doctor as told.  · Get regular pelvic exams and Pap tests.  GET HELP IF:  · Your periods are late, not regular, or painful.  · You stop having periods.  · Your belly (abdominal) or pelvic pain does not go away.  · Your belly becomes large or puffy (swollen).  · You have a hard time peeing (totally emptying your bladder).  · You have pressure on your bladder.  · You have pain during sex.  · You feel fullness, pressure, or discomfort in your belly.  · You lose weight for no reason.  · You feel sick most of the time.  · You have a hard time pooping (constipation).  · You do not feel like eating.  · You develop pimples (acne).  · You have an increase in hair on your body and face.  · You are gaining weight for no reason.  · You think you are pregnant.  GET HELP RIGHT AWAY IF:   · Your belly pain gets worse.  · You feel sick to your stomach (nauseous), and you throw up (vomit).  · You have a fever that comes on fast.  · You have belly pain while pooping (bowel movement).  · Your periods are heavier than usual.  MAKE SURE YOU:   · Understand these instructions.  · Will watch your condition.  · Will get help right away if you are not doing well or get worse.     This information is not intended to replace advice given to you by your health care provider. Make sure you discuss any questions you have with your health care provider.     Document Released: 04/19/2008 Document Revised: 08/22/2013 Document Reviewed: 07/09/2013  Elsevier Interactive Patient Education ©2016 Elsevier Inc.

## 2015-11-20 NOTE — ED Notes (Signed)
C/o constant abd pain onset x1 month that increases w/activity Denies: f/v/n/d.... LMB = yest = normal/soft A&O x4... No acute distress.

## 2015-11-27 ENCOUNTER — Encounter: Payer: Self-pay | Admitting: Internal Medicine

## 2015-11-27 ENCOUNTER — Ambulatory Visit: Payer: Self-pay | Admitting: Internal Medicine

## 2015-12-04 DIAGNOSIS — F319 Bipolar disorder, unspecified: Secondary | ICD-10-CM | POA: Diagnosis not present

## 2015-12-25 DIAGNOSIS — F319 Bipolar disorder, unspecified: Secondary | ICD-10-CM | POA: Diagnosis not present

## 2016-01-08 DIAGNOSIS — F319 Bipolar disorder, unspecified: Secondary | ICD-10-CM | POA: Diagnosis not present

## 2016-01-20 ENCOUNTER — Encounter: Payer: Self-pay | Admitting: Internal Medicine

## 2016-01-20 ENCOUNTER — Ambulatory Visit (INDEPENDENT_AMBULATORY_CARE_PROVIDER_SITE_OTHER): Payer: Medicare Other | Admitting: Internal Medicine

## 2016-01-20 ENCOUNTER — Telehealth: Payer: Self-pay | Admitting: Internal Medicine

## 2016-01-20 ENCOUNTER — Other Ambulatory Visit: Payer: Self-pay | Admitting: Internal Medicine

## 2016-01-20 VITALS — BP 125/74 | HR 66 | Temp 98.4°F | Wt 210.4 lb

## 2016-01-20 DIAGNOSIS — R51 Headache: Secondary | ICD-10-CM

## 2016-01-20 DIAGNOSIS — J3489 Other specified disorders of nose and nasal sinuses: Secondary | ICD-10-CM | POA: Diagnosis not present

## 2016-01-20 DIAGNOSIS — R519 Headache, unspecified: Secondary | ICD-10-CM | POA: Insufficient documentation

## 2016-01-20 DIAGNOSIS — J309 Allergic rhinitis, unspecified: Secondary | ICD-10-CM

## 2016-01-20 MED ORDER — FLUTICASONE PROPIONATE 50 MCG/ACT NA SUSP: 1.0000 | Freq: Every day | NASAL | Status: DC

## 2016-01-20 MED ORDER — CETIRIZINE HCL 10 MG PO TABS: 10.0000 mg | ORAL_TABLET | Freq: Every day | ORAL | Status: DC | PRN

## 2016-01-20 MED ORDER — TRIAMCINOLONE ACETONIDE 55 MCG/ACT NA AERO: 1.0000 | INHALATION_SPRAY | Freq: Two times a day (BID) | NASAL | Status: DC

## 2016-01-20 NOTE — Patient Instructions (Signed)
[name],  It was very nice to meet you today!  I'm sorry you've been having such bad headaches this past week.  As we discussed, I think this is related to your allergies so we'll start our treatment there.  I've given you a medication called cetirizine that will help break up the mucus from your allergies.  I've also given you a nose spray to help the inflammation in your nose.  You can keep taking Advil as needed if your headache gets too severe.  If you start having fevers, give [place] a call for an earlier appointment.  Otherwise, we'll see you back on March 21 for your follow-up appointment.  Take care, Dr. Melburn Hake

## 2016-01-20 NOTE — Assessment & Plan Note (Addendum)
Her headache seems to be most temporally related to congestion and sneezing suggestive of seasonal allergies, so I think this is most likely a sinus headache. She doesn't have any red flags concerning for structural lesion, nor any vision changes or nausea suggestive of intracranial idiopathic hypertension, nor any signs of migraines. I prescribed Nasacort and cetirizine, and recommended she use a Neti pot to help clear her congestion.

## 2016-01-20 NOTE — Progress Notes (Signed)
Patient ID: [name], female   DOB: 06-18-1971, 45 y.o.   MRN: [id number] [facility] Subjective:   Patient ID: [name] female   DOB: [birth year] 45 y.o.   MRN: [id number]  HPI: [name] is a 45 y.o. female with type 2 diabetes, hypertension, cerebral palsy, and schizoaffective disorder presenting for evaluation of headache and sinus congestion.  Sinus headache: For the past week, she has been sneezing and congested, and has had a frontal headache. She has not tried any allergy medications yet. She started quetiapine last week, but her headache actually started the day before. She denies any changes in her vision, nausea, photophobia, or red flags such as nighttime awakenings, weight loss, vertigo, focal weakness, fever, or nuchal rigidity.  She is not a smoker and I have reviewed her medications with her today.  Please see the assessment and plan for the status of the patient's chronic medical problems.  Review of Systems  Constitutional: Negative for fever, chills, weight loss and malaise/fatigue.  HENT: Positive for congestion and sore throat. Negative for hearing loss and tinnitus.   Eyes: Negative for blurred vision, double vision and photophobia.  Respiratory: Negative for cough.   Skin: Negative for rash.  Neurological: Positive for headaches. Negative for dizziness, tingling and focal weakness.  Psychiatric/Behavioral: Negative for depression. The patient is not nervous/anxious.    Objective:  Physical Exam: Filed Vitals:   01/20/16 0824  BP: 125/74  Pulse: 66  Temp: 98.4 F (36.9 C)  TempSrc: Oral  Weight: 210 lb 6.4 oz (95.437 kg)  SpO2: 100%   General: resting in chair comfortably, appropriately conversational HEENT: edematous turbinates, oropharynx without lesions Cardiac: regular rate and rhythm, no rubs, murmurs or gallops Pulm: breathing well, clear to auscultation bilaterally Lymph: no cervical or supraclavicular  lymphadenopathy Skin: no rash, hair, or nail changes Neuro: alert and oriented X3, cranial nerves II-XII grossly intact, moving all extremities well   Assessment & Plan:  Case discussed with Dr. Evette Doffing  Sinus headache Her headache seems to be most temporally related to congestion and sneezing suggestive of seasonal allergies, so I think this is most likely a sinus headache. She doesn't have any red flags concerning for structural lesion, nor any vision changes or nausea suggestive of intracranial idiopathic hypertension, nor any signs of migraines. I prescribed Nasacort and cetirizine, and recommended she use a Merry pot to help her congestion.   Medications Ordered Meds ordered this encounter  Medications  . cetirizine (ZYRTEC) 10 MG tablet    Sig: Take 1 tablet (10 mg total) by mouth daily as needed for allergies.    Dispense:  30 tablet    Refill:  3  . triamcinolone (NASACORT AQ) 55 MCG/ACT AERO nasal inhaler    Sig: Place 1 spray into the nose 2 (two) times daily.    Dispense:  1 Inhaler    Refill:  2   Other Orders No orders of the defined types were placed in this encounter.   Follow Up: Return in 2 weeks (on 02/03/2016).

## 2016-01-20 NOTE — Telephone Encounter (Signed)
[name] from [facility] states Nasacort will not be cover through the insurance. Requesting different med. Please call back.

## 2016-01-21 NOTE — Addendum Note (Signed)
Addended by: Lalla Brothers T on: 01/21/2016 02:15 PM   Modules accepted: Level of Service

## 2016-01-21 NOTE — Progress Notes (Signed)
Internal Medicine Clinic Attending  Case discussed with Dr. Flores at the time of the visit.  We reviewed the resident's history and exam and pertinent patient test results.  I agree with the assessment, diagnosis, and plan of care documented in the resident's note. 

## 2016-01-23 DIAGNOSIS — F319 Bipolar disorder, unspecified: Secondary | ICD-10-CM | POA: Diagnosis not present

## 2016-01-29 DIAGNOSIS — F319 Bipolar disorder, unspecified: Secondary | ICD-10-CM | POA: Diagnosis not present

## 2016-02-02 ENCOUNTER — Telehealth: Payer: Self-pay | Admitting: Internal Medicine

## 2016-02-02 NOTE — Telephone Encounter (Signed)
APPT. REMINDER CALL, NO ANSWER, NO VOICE MAIL °

## 2016-02-03 ENCOUNTER — Encounter: Payer: Self-pay | Admitting: Internal Medicine

## 2016-03-03 DIAGNOSIS — F3130 Bipolar disorder, current episode depressed, mild or moderate severity, unspecified: Secondary | ICD-10-CM | POA: Diagnosis not present

## 2016-03-03 DIAGNOSIS — Z79818 Long term (current) use of other agents affecting estrogen receptors and estrogen levels: Secondary | ICD-10-CM | POA: Diagnosis not present

## 2016-03-03 DIAGNOSIS — F329 Major depressive disorder, single episode, unspecified: Secondary | ICD-10-CM | POA: Diagnosis not present

## 2016-03-03 DIAGNOSIS — R45851 Suicidal ideations: Secondary | ICD-10-CM | POA: Diagnosis not present

## 2016-03-03 DIAGNOSIS — D649 Anemia, unspecified: Secondary | ICD-10-CM | POA: Diagnosis not present

## 2016-03-03 DIAGNOSIS — Z79899 Other long term (current) drug therapy: Secondary | ICD-10-CM | POA: Diagnosis not present

## 2016-03-03 DIAGNOSIS — E7800 Pure hypercholesterolemia, unspecified: Secondary | ICD-10-CM | POA: Diagnosis not present

## 2016-03-03 DIAGNOSIS — I10 Essential (primary) hypertension: Secondary | ICD-10-CM | POA: Diagnosis not present

## 2016-03-15 ENCOUNTER — Other Ambulatory Visit: Payer: Self-pay | Admitting: Internal Medicine

## 2016-03-16 NOTE — Telephone Encounter (Signed)
Can we get her in for an appointment soon?

## 2016-03-16 NOTE — Telephone Encounter (Signed)
Last visit 01/20/2016. No show 02/03/2016.

## 2016-05-04 ENCOUNTER — Ambulatory Visit: Payer: Self-pay | Admitting: Internal Medicine

## 2016-05-13 ENCOUNTER — Encounter: Payer: Self-pay | Admitting: Internal Medicine

## 2016-05-13 ENCOUNTER — Ambulatory Visit: Payer: Self-pay | Admitting: Internal Medicine

## 2016-05-22 ENCOUNTER — Other Ambulatory Visit: Payer: Self-pay | Admitting: Internal Medicine

## 2016-06-03 DIAGNOSIS — T7631XA Adult psychological abuse, suspected, initial encounter: Secondary | ICD-10-CM | POA: Diagnosis not present

## 2016-06-03 DIAGNOSIS — R51 Headache: Secondary | ICD-10-CM | POA: Diagnosis not present

## 2016-06-03 DIAGNOSIS — E7800 Pure hypercholesterolemia, unspecified: Secondary | ICD-10-CM | POA: Diagnosis not present

## 2016-06-03 DIAGNOSIS — Z90710 Acquired absence of both cervix and uterus: Secondary | ICD-10-CM | POA: Diagnosis not present

## 2016-06-03 DIAGNOSIS — N200 Calculus of kidney: Secondary | ICD-10-CM | POA: Diagnosis not present

## 2016-06-03 DIAGNOSIS — R1013 Epigastric pain: Secondary | ICD-10-CM | POA: Diagnosis not present

## 2016-06-03 DIAGNOSIS — I10 Essential (primary) hypertension: Secondary | ICD-10-CM | POA: Diagnosis not present

## 2016-06-03 DIAGNOSIS — R112 Nausea with vomiting, unspecified: Secondary | ICD-10-CM | POA: Diagnosis not present

## 2016-06-04 DIAGNOSIS — R1084 Generalized abdominal pain: Secondary | ICD-10-CM | POA: Diagnosis not present

## 2016-06-06 DIAGNOSIS — E7800 Pure hypercholesterolemia, unspecified: Secondary | ICD-10-CM | POA: Diagnosis not present

## 2016-06-06 DIAGNOSIS — Z90710 Acquired absence of both cervix and uterus: Secondary | ICD-10-CM | POA: Diagnosis not present

## 2016-06-06 DIAGNOSIS — I10 Essential (primary) hypertension: Secondary | ICD-10-CM | POA: Diagnosis not present

## 2016-06-06 DIAGNOSIS — R109 Unspecified abdominal pain: Secondary | ICD-10-CM | POA: Diagnosis not present

## 2016-06-06 DIAGNOSIS — R102 Pelvic and perineal pain: Secondary | ICD-10-CM | POA: Diagnosis not present

## 2016-06-06 DIAGNOSIS — R05 Cough: Secondary | ICD-10-CM | POA: Diagnosis not present

## 2016-06-06 DIAGNOSIS — R1084 Generalized abdominal pain: Secondary | ICD-10-CM | POA: Diagnosis not present

## 2016-06-06 DIAGNOSIS — K219 Gastro-esophageal reflux disease without esophagitis: Secondary | ICD-10-CM | POA: Diagnosis not present

## 2016-06-17 ENCOUNTER — Encounter (HOSPITAL_COMMUNITY): Payer: Self-pay | Admitting: Emergency Medicine

## 2016-06-17 ENCOUNTER — Emergency Department (HOSPITAL_COMMUNITY)
Admission: EM | Admit: 2016-06-17 | Discharge: 2016-06-18 | Disposition: A | Discharge status: Home / Self Care | Payer: Medicare Other | Attending: Emergency Medicine | Admitting: Emergency Medicine

## 2016-06-17 DIAGNOSIS — F23 Brief psychotic disorder: Secondary | ICD-10-CM | POA: Insufficient documentation

## 2016-06-17 DIAGNOSIS — F259 Schizoaffective disorder, unspecified: Secondary | ICD-10-CM | POA: Diagnosis present

## 2016-06-17 DIAGNOSIS — Z79899 Other long term (current) drug therapy: Secondary | ICD-10-CM | POA: Diagnosis not present

## 2016-06-17 DIAGNOSIS — E119 Type 2 diabetes mellitus without complications: Secondary | ICD-10-CM | POA: Diagnosis not present

## 2016-06-17 DIAGNOSIS — Z7984 Long term (current) use of oral hypoglycemic drugs: Secondary | ICD-10-CM | POA: Diagnosis not present

## 2016-06-17 DIAGNOSIS — I10 Essential (primary) hypertension: Secondary | ICD-10-CM | POA: Diagnosis not present

## 2016-06-17 DIAGNOSIS — R443 Hallucinations, unspecified: Secondary | ICD-10-CM | POA: Diagnosis not present

## 2016-06-17 DIAGNOSIS — F258 Other schizoaffective disorders: Secondary | ICD-10-CM

## 2016-06-17 DIAGNOSIS — F29 Unspecified psychosis not due to a substance or known physiological condition: Secondary | ICD-10-CM | POA: Diagnosis not present

## 2016-06-17 DIAGNOSIS — R4182 Altered mental status, unspecified: Secondary | ICD-10-CM | POA: Diagnosis not present

## 2016-06-17 DIAGNOSIS — Z791 Long term (current) use of non-steroidal anti-inflammatories (NSAID): Secondary | ICD-10-CM | POA: Diagnosis not present

## 2016-06-17 DIAGNOSIS — R440 Auditory hallucinations: Secondary | ICD-10-CM | POA: Diagnosis present

## 2016-06-17 LAB — COMPREHENSIVE METABOLIC PANEL
ALK PHOS: 74 U/L (ref 38–126)
ALT: 27 U/L (ref 14–54)
AST: 21 U/L (ref 15–41)
Albumin: 4.5 g/dL (ref 3.5–5.0)
Anion gap: 12 (ref 5–15)
BUN: 6 mg/dL (ref 6–20)
CALCIUM: 9.4 mg/dL (ref 8.9–10.3)
CHLORIDE: 102 mmol/L (ref 101–111)
CO2: 24 mmol/L (ref 22–32)
CREATININE: 0.7 mg/dL (ref 0.44–1.00)
GFR calc Af Amer: >60 mL/min (ref >60)
Glucose, Bld: 157 mg/dL — ABNORMAL HIGH (ref 65–99)
Potassium: 2.6 mmol/L — CL (ref 3.5–5.1)
Sodium: 138 mmol/L (ref 135–145)
Total Bilirubin: 0.3 mg/dL (ref 0.3–1.2)
Total Protein: 7.9 g/dL (ref 6.5–8.1)

## 2016-06-17 LAB — CBC
HCT: 40.4 % (ref 36.0–46.0)
Hemoglobin: 14.1 g/dL (ref 12.0–15.0)
MCH: 29.6 pg (ref 26.0–34.0)
MCHC: 34.9 g/dL (ref 30.0–36.0)
MCV: 84.7 fL (ref 78.0–100.0)
PLATELETS: 267 10*3/uL (ref 150–400)
RBC: 4.77 MIL/uL (ref 3.87–5.11)
RDW: 13.2 % (ref 11.5–15.5)
WBC: 6.2 10*3/uL (ref 4.0–10.5)

## 2016-06-17 LAB — RAPID URINE DRUG SCREEN, HOSP PERFORMED
AMPHETAMINES: NOT DETECTED
BENZODIAZEPINES: NOT DETECTED
Barbiturates: NOT DETECTED
COCAINE: NOT DETECTED
OPIATES: NOT DETECTED
Tetrahydrocannabinol: NOT DETECTED

## 2016-06-17 LAB — ACETAMINOPHEN LEVEL: Acetaminophen (Tylenol), Serum: <10 ug/mL — ABNORMAL LOW (ref 10–30)

## 2016-06-17 LAB — ETHANOL

## 2016-06-17 LAB — SALICYLATE LEVEL: Salicylate Lvl: <4 mg/dL (ref 2.8–30.0)

## 2016-06-17 MED ORDER — ZIPRASIDONE MESYLATE 20 MG IM SOLR
10.0000 mg | Freq: Once | INTRAMUSCULAR | Status: AC
  Administered 2016-06-18: 10 mg via INTRAMUSCULAR
  Filled 2016-06-17: qty 20

## 2016-06-17 MED ORDER — ACETAMINOPHEN 325 MG PO TABS: 650.0000 mg | ORAL_TABLET | ORAL | Status: DC | PRN

## 2016-06-17 MED ORDER — METOPROLOL SUCCINATE ER 25 MG PO TB24: 25.0000 mg | ORAL_TABLET | Freq: Every day | ORAL | Status: DC

## 2016-06-17 MED ORDER — VENLAFAXINE HCL ER 75 MG PO CP24: 75.0000 mg | ORAL_CAPSULE | Freq: Two times a day (BID) | ORAL | Status: DC

## 2016-06-17 MED ORDER — POTASSIUM CHLORIDE CRYS ER 20 MEQ PO TBCR
40.0000 meq | EXTENDED_RELEASE_TABLET | Freq: Once | ORAL | Status: AC
  Administered 2016-06-18: 40 meq via ORAL
  Filled 2016-06-17 (×2): qty 2

## 2016-06-17 MED ORDER — POTASSIUM CHLORIDE 10 MEQ/100ML IV SOLN
10.0000 meq | INTRAVENOUS | Status: AC
  Filled 2016-06-17: qty 100

## 2016-06-17 MED ORDER — LORAZEPAM 1 MG PO TABS: 1.0000 mg | ORAL_TABLET | Freq: Three times a day (TID) | ORAL | Status: DC | PRN

## 2016-06-17 MED ORDER — LISINOPRIL-HYDROCHLOROTHIAZIDE 10-12.5 MG PO TABS: 1.0000 | ORAL_TABLET | Freq: Every day | ORAL | Status: DC

## 2016-06-17 MED ORDER — METFORMIN HCL 500 MG PO TABS: 500.0000 mg | ORAL_TABLET | Freq: Two times a day (BID) | ORAL | Status: DC

## 2016-06-17 MED ORDER — LORAZEPAM 2 MG/ML IJ SOLN: 2.0000 mg | Freq: Once | INTRAMUSCULAR | Status: DC

## 2016-06-17 MED ORDER — PRAVASTATIN SODIUM 40 MG PO TABS: 40.0000 mg | ORAL_TABLET | Freq: Every day | ORAL | Status: DC

## 2016-06-17 NOTE — ED Notes (Signed)
Pt belongings was placed into hospital white bag. Pt has a pair of stud ear rings that are in a sealed cup placed with her belongings.

## 2016-06-17 NOTE — ED Notes (Signed)
Pt changed into wine scrubs

## 2016-06-17 NOTE — ED Notes (Signed)
Patient refuses to answer any questions. Patient states "Everything my family should be in the paperwork and I hope it's correct".

## 2016-06-17 NOTE — BH Assessment (Signed)
Patient refused to participate in the assessment and states "that man is coming out of his room for no reason and you need to do your job, if you don't do what you're supposed to do, whatever my family says, I hope it's right, please do what I asked you to do and you're just writing stuff from the top of your head and you're still not doing your job and you are not cooperating, if you're supposed to be a specialist then you're supposed to be doing your job, because you're supposed to be doing your job and typing my name and everything and you're not typing my name, you're not doing your job."   Rosalin Hawking, LCSW Therapeutic Triage Specialist [place] 06/17/2016 7:57 PM

## 2016-06-17 NOTE — ED Notes (Signed)
2.6 potassium, EDP Belfi made aware of same.

## 2016-06-17 NOTE — BH Assessment (Signed)
Assessment completed. Consulted with Darlyne Russian, PA-C who recommends overnight observation and evaluation in the morning.   Rosalin Hawking, LCSW Therapeutic Triage Specialist [place] 06/17/2016 11:32 PM

## 2016-06-17 NOTE — ED Notes (Signed)
Patient states "I'm not taking any medications that my mother and sister haven't said I need to take". Attempted to explain to patient reason behind taking PO potassium. Patient refuses to take medication. EDP, Belfi informed of same.

## 2016-06-17 NOTE — ED Notes (Signed)
Patient IVC'd, paperwork states,
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
## 2016-06-17 NOTE — ED Notes (Signed)
Pt and belongings were wand.

## 2016-06-17 NOTE — ED Provider Notes (Addendum)
[facility] DEPT Provider Note   CSN: [id number] Arrival date & time: 06/17/16  1806  First Provider Contact:  First MD Initiated Contact with Patient 06/17/16 1913        History   Chief Complaint No chief complaint on file.   HPI [name] is a 45 y.o. female.  Patient is brought in with possible psychosis. She keeps repeating that "We just need to do what her family has said to do.  Everything should be in the paperwork".  [facility] crisis has stated that patient has been having erratic behavior and auditory hallucinations. Patient won't answer any other questions. She states that she is not hurting anywhere. She denies any recent illnesses.      Past Medical History:  Diagnosis Date  . Anemia   . Congenital cerebral palsy ([place])   . Depression   . Diabetes mellitus due to abnormal insulin ([place])   . Endometriosis   . Hearing impairment   . Heart murmur    at birth  . Hypertension   . Neuromuscular disorder ([place])    Cerebral palsy- very mild left side of brain  . Schizoaffective disorder [facility])     Patient Active Problem List   Diagnosis Date Noted  . Sinus headache 01/20/2016  . Anemia due to chronic blood loss   . Hyperlipidemia LDL goal <70 09/10/2014  . Thyroid nodule 07/30/2014  . Morbid obesity ([place]) 07/30/2014  . Depression, controlled 07/30/2014  . Preventative health care 07/30/2014  . Essential hypertension 11/07/2012  . Type 2 diabetes mellitus ([place]) 11/07/2012  . LEARNING DISABILITY 09/26/2007  . FIBROCYSTIC BREAST DISEASE 09/26/2007  . ENDOMETRIOSIS 09/26/2007    Past Surgical History:  Procedure Laterality Date  . CESAREAN SECTION    . EXPLORATORY LAPAROTOMY WITH ABDOMINAL MASS EXCISION    . MYOMECTOMY N/A 08/05/2015   Procedure: Abdominal Hysterectomy, Bilateral Salpingectomy;  Surgeon: Osborne Oman, MD;  Location: [facility];  Service: Gynecology;  Laterality: N/A;    OB History    Gravida Para Term Preterm AB Living   1 1 1     1     SAB TAB Ectopic Multiple Live Births                   Home Medications    Prior to Admission medications   Medication Sig Start Date End Date Taking? Authorizing Provider  cetirizine (ZYRTEC) 10 MG tablet Take 1 tablet (10 mg total) by mouth daily as needed for allergies. 01/20/16   Loleta Chance, MD  cyanocobalamin 1000 MCG tablet TAKE ONE (1) TABLET BY MOUTH EVERY DAY 09/16/15   Juliet Rude, MD  cyclobenzaprine (FLEXERIL) 10 MG tablet Take 1 tablet (10 mg total) by mouth 3 (three) times daily as needed for muscle spasms. 09/19/15   Clayton Bibles, PA-C  ferrous sulfate 325 (65 FE) MG tablet TAKE ONE (1) TABLET BY MOUTH 3 TIMES DAILY WITH MEALS 11/04/15   Carly J Rivet, MD  fluticasone (FLONASE) 50 MCG/ACT nasal spray Place 1 spray into both nostrils daily. 01/20/16 01/19/17  Loleta Chance, MD  ibuprofen (ADVIL,MOTRIN) 800 MG tablet Take 1 tablet (800 mg total) by mouth every 8 (eight) hours as needed for mild pain or moderate pain. 09/19/15   Clayton Bibles, PA-C  lisinopril-hydrochlorothiazide (PRINZIDE,ZESTORETIC) 10-12.5 MG tablet TAKE ONE (1) TABLET BY MOUTH EVERY DAY 05/24/16   Carly Montey Hora, MD  metFORMIN (GLUCOPHAGE) 500 MG tablet TAKE ONE (1) TABLET BY MOUTH TWO (2) TIMES DAILY WITH A MEAL  03/16/16   Carly Montey Hora, MD  metoprolol succinate (TOPROL-XL) 25 MG 24 hr tablet Take 1 tablet (25 mg total) by mouth daily. 08/18/15   Juliet Rude, MD  pravastatin (PRAVACHOL) 40 MG tablet Take 1 tablet (40 mg total) by mouth daily. 08/18/15   Carly Montey Hora, MD  promethazine (PHENERGAN) 12.5 MG tablet Take 1 tablet (12.5 mg total) by mouth every 6 (six) hours as needed for nausea or vomiting. 09/16/15   Juliet Rude, MD  venlafaxine XR (EFFEXOR-XR) 75 MG 24 hr capsule Take 75-150 mg by mouth 2 (two) times daily. TAKE ONE CAPSULE BY MOUTH IN THE MORNING AND TWO AT BEDTIME per patient 02/03/12   Patrick Jupiter, PA-C    Family History Family History  Problem Relation Age of Onset  . Heart failure Mother   .  Hypertension Mother   . Diabetes Mellitus II Mother   . Cancer Mother   . Diabetes Mellitus II Father   . Hypertension Father     Social History Social History  Substance Use Topics  . Smoking status: Never Smoker  . Smokeless tobacco: Never Used  . Alcohol use No     Allergies   Bactrim [sulfamethoxazole-trimethoprim]; Ivp dye [iodinated diagnostic agents]; Pollen extract; and Sulfa antibiotics   Review of Systems Review of Systems  Unable to perform ROS: Mental status change     Physical Exam Updated Vital Signs BP 137/89 (BP Location: Left Arm)   Pulse (!) 148   Temp 98.1 F (36.7 C) (Oral)   Resp 18   LMP 07/23/2015 (Exact Date)   SpO2 99%   Physical Exam  Constitutional: She is oriented to person, place, and time. She appears well-developed and well-nourished.  HENT:  Head: Normocephalic and atraumatic.  Eyes: Pupils are equal, round, and reactive to light.  Neck: Normal range of motion. Neck supple.  Cardiovascular: Normal rate, regular rhythm and normal heart sounds.   Pulmonary/Chest: Effort normal and breath sounds normal. No respiratory distress. She has no wheezes. She has no rales. She exhibits no tenderness.  Abdominal: Soft. Bowel sounds are normal. There is no tenderness. There is no rebound and no guarding.  Musculoskeletal: Normal range of motion. She exhibits no edema.  Lymphadenopathy:    She has no cervical adenopathy.  Neurological: She is alert and oriented to person, place, and time.  Skin: Skin is warm and dry. No rash noted.  Psychiatric: She has a normal mood and affect. Thought content is delusional.     ED Treatments / Results  Labs (all labs ordered are listed, but only abnormal results are displayed) Labs Reviewed  COMPREHENSIVE METABOLIC PANEL - Abnormal; Notable for the following:       Result Value   Potassium 2.6 (*)    Glucose, Bld 157 (*)    All other components within normal limits  ACETAMINOPHEN LEVEL - Abnormal;  Notable for the following:    Acetaminophen (Tylenol), Serum <10 (*)    All other components within normal limits  ETHANOL  SALICYLATE LEVEL  CBC  URINE RAPID DRUG SCREEN, HOSP PERFORMED    EKG  EKG Interpretation None       Radiology No results found.  Procedures Procedures (including critical care time)  Medications Ordered in ED Medications  potassium chloride SA (K-DUR,KLOR-CON) CR tablet 40 mEq (40 mEq Oral Refused 06/17/16 2029)  LORazepam (ATIVAN) tablet 1 mg (not administered)  acetaminophen (TYLENOL) tablet 650 mg (not administered)  lisinopril-hydrochlorothiazide (PRINZIDE,ZESTORETIC) 10-12.5 MG per  tablet 1 tablet (not administered)  metFORMIN (GLUCOPHAGE) tablet 500 mg (not administered)  metoprolol succinate (TOPROL-XL) 24 hr tablet 25 mg (not administered)  pravastatin (PRAVACHOL) tablet 40 mg (not administered)  venlafaxine XR (EFFEXOR-XR) 24 hr capsule 75-150 mg (not administered)  LORazepam (ATIVAN) injection 2 mg (not administered)     Initial Impression / Assessment and Plan / ED Course  I have reviewed the triage vital signs and the nursing notes.  Pertinent labs & imaging results that were available during my care of the patient were reviewed by me and considered in my medical decision making (see chart for details).  Clinical Course    Patient presents with hallucinations in erratic behavior. She is medically cleared. Her potassium is low and she was given a dose of potassium in the ED. TTS consult is pending.  20:45 Pt becoming more agitated, refusing to take the potassium.  Will do IVC papers, give ativan.  Final Clinical Impressions(s) / ED Diagnoses   Final diagnoses:  Brief psychotic disorder    New Prescriptions New Prescriptions   No medications on file     Malvin Johns, MD 06/17/16 1943    Malvin Johns, MD 06/17/16 2345

## 2016-06-18 DIAGNOSIS — F258 Other schizoaffective disorders: Secondary | ICD-10-CM

## 2016-06-18 DIAGNOSIS — F23 Brief psychotic disorder: Secondary | ICD-10-CM | POA: Diagnosis not present

## 2016-06-18 DIAGNOSIS — F259 Schizoaffective disorder, unspecified: Secondary | ICD-10-CM | POA: Diagnosis present

## 2016-06-18 MED ORDER — GABAPENTIN 300 MG PO CAPS: 300.0000 mg | ORAL_CAPSULE | Freq: Two times a day (BID) | ORAL | 0 refills | Status: DC

## 2016-06-18 MED ORDER — FLUPHENAZINE HCL 2.5 MG PO TABS: 2.5000 mg | ORAL_TABLET | Freq: Every day | ORAL | Status: DC

## 2016-06-18 MED ORDER — VENLAFAXINE HCL ER 150 MG PO CP24
150.0000 mg | ORAL_CAPSULE | Freq: Every day | ORAL | Status: DC
  Filled 2016-06-18: qty 1

## 2016-06-18 MED ORDER — GABAPENTIN 300 MG PO CAPS
300.0000 mg | ORAL_CAPSULE | Freq: Two times a day (BID) | ORAL | Status: DC
Start: 2016-06-18 — End: 2016-06-18

## 2016-06-18 MED ORDER — LISINOPRIL 10 MG PO TABS
10.0000 mg | ORAL_TABLET | Freq: Every day | ORAL | Status: DC
Start: 2016-06-18 — End: 2016-06-18
  Filled 2016-06-18: qty 1

## 2016-06-18 MED ORDER — HYDROCHLOROTHIAZIDE 12.5 MG PO CAPS: 12.5000 mg | ORAL_CAPSULE | Freq: Every day | ORAL | Status: DC

## 2016-06-18 MED ORDER — VENLAFAXINE HCL ER 150 MG PO CP24: 150.0000 mg | ORAL_CAPSULE | Freq: Every day | ORAL | 0 refills | Status: DC

## 2016-06-18 MED ORDER — FLUPHENAZINE HCL 2.5 MG PO TABS: 2.5000 mg | ORAL_TABLET | Freq: Every day | ORAL | 0 refills | Status: DC

## 2016-06-18 NOTE — ED Notes (Signed)
TTS PRESENT 

## 2016-06-18 NOTE — Consult Note (Signed)
[facility] Psychiatry Consult   Reason for Consult: Erratic behavior, psychosis Referring Physician: EDP Patient Identification: Joleene Burnham MRN:  [id number] Principal Diagnosis: Schizoaffective disorder, chronic condition ([place]) Diagnosis:   Patient Active Problem List   Diagnosis Date Noted  . Schizoaffective disorder, chronic condition ([place]) [F25.8] 06/18/2016    Priority: Low  . Sinus headache [R51] 01/20/2016  . Anemia due to chronic blood loss [D50.0]   . Hyperlipidemia LDL goal <70 [E78.5] 09/10/2014  . Thyroid nodule [E04.1] 07/30/2014  . Morbid obesity ([place]) [E66.01] 07/30/2014  . Depression, controlled [F32.9] 07/30/2014  . Preventative health care [Z00.00] 07/30/2014  . Essential hypertension [I10] 11/07/2012  . Type 2 diabetes mellitus ([place]) [E11.9] 11/07/2012  . LEARNING DISABILITY [F81.89] 09/26/2007  . FIBROCYSTIC BREAST DISEASE [N60.19] 09/26/2007  . ENDOMETRIOSIS [N80.9] 09/26/2007    Total Time spent with patient: 45 minutes  Subjective:   Ardith Test is a 45 y.o. female patient admitted with erratic behavior.  HPI: 45 year old [place] female with long history of mental illness who is under the care on [facility] Team. Patient was brought to the ED for evaluation yesterday after she claimed that someone was trying to do some witchcraft on her and there are people trying to kill.  Patient also told her provider yesterday that she was hearing voices and they are telling her what they are going to do and what others are doing. Today, patient is pleasant and calm. She denies SI/HI, delusional thinking or psychosis. She states that the medication administered to her last night was very helpful.  Past Psychiatric History: as above  Risk to Self: Suicidal Ideation:  No Suicidal Intent:  No Is patient at risk for suicide?:  No Suicidal Plan?:  No Access to Means:  No What has been your use of drugs/alcohol within the last 12 months?:  No How many times?:   No Other Self Harm Risks:  No Triggers for Past Attempts:  No Intentional Self Injurious Behavior:  No Risk to Others: Homicidal Ideation: No Thoughts of Harm to Others: No Current Homicidal Intent:  No Current Homicidal Plan:  No Access to Homicidal Means:  denies Identified Victim:  N/A History of harm to others?:  Denies Assessment of Violence:  Yes Violent Behavior Description:  N/A Does patient have access to weapons?:  Denies Criminal Charges Pending?:  Denies Does patient have a court date:  Denies Prior Inpatient Therapy: Prior Inpatient Therapy:  (UTA) Prior Therapy Dates:  (UTA) Prior Therapy Facilty/Provider(s):  (UTA) Reason for Treatment:  (UTA) Prior Outpatient Therapy: Prior Outpatient Therapy:  (UTA) Prior Therapy Dates:  (UTA) Prior Therapy Facilty/Provider(s):  (UTA) Reason for Treatment:  (UTA) Does patient have an ACCT team?:  (UTA) Does patient have Intensive In-House Services?  : No Does patient have [facility] services? : Unknown Does patient have P4CC services?: Unknown  Past Medical History:  Past Medical History:  Diagnosis Date  . Anemia   . Congenital cerebral palsy ([place])   . Depression   . Diabetes mellitus due to abnormal insulin ([place])   . Endometriosis   . Hearing impairment   . Heart murmur    at birth  . Hypertension   . Neuromuscular disorder ([place])    Cerebral palsy- very mild left side of brain  . Schizoaffective disorder [facility])     Past Surgical History:  Procedure Laterality Date  . CESAREAN SECTION    . EXPLORATORY LAPAROTOMY WITH ABDOMINAL MASS EXCISION    . MYOMECTOMY N/A 08/05/2015  Procedure: Abdominal Hysterectomy, Bilateral Salpingectomy;  Surgeon: Osborne Oman, MD;  Location: [facility];  Service: Gynecology;  Laterality: N/A;   Family History:  Family History  Problem Relation Age of Onset  . Heart failure Mother   . Hypertension Mother   . Diabetes Mellitus II Mother   . Cancer Mother   . Diabetes Mellitus II Father    . Hypertension Father    Family Psychiatric  History: Social History:  History  Alcohol Use No     History  Drug Use No    Social History   Social History  . Marital status: Married    Spouse name: N/A  . Number of children: N/A  . Years of education: N/A   Social History Main Topics  . Smoking status: Never Smoker  . Smokeless tobacco: Never Used  . Alcohol use No  . Drug use: No  . Sexual activity: Yes    Birth control/ protection: Surgical   Other Topics Concern  . None   Social History Narrative  . None   Additional Social History:    Allergies:   Allergies  Allergen Reactions  . Bactrim [Sulfamethoxazole-Trimethoprim] Anaphylaxis, Shortness Of Breath and Swelling  . Ivp Dye [Iodinated Diagnostic Agents] Nausea And Vomiting  . Pollen Extract     Seasonal allergies  . Sulfa Antibiotics Swelling    Eyes and lips swelled up    Labs:  Results for orders placed or performed during the hospital encounter of 06/17/16 (from the past 48 hour(s))  Rapid urine drug screen (hospital performed)     Status: None   Collection Time: 06/17/16  6:19 PM  Result Value Ref Range   Opiates NONE DETECTED NONE DETECTED   Cocaine NONE DETECTED NONE DETECTED   Benzodiazepines NONE DETECTED NONE DETECTED   Amphetamines NONE DETECTED NONE DETECTED   Tetrahydrocannabinol NONE DETECTED NONE DETECTED   Barbiturates NONE DETECTED NONE DETECTED    Comment:        DRUG SCREEN FOR MEDICAL PURPOSES ONLY.  IF CONFIRMATION IS NEEDED FOR ANY PURPOSE, NOTIFY LAB WITHIN 5 DAYS.        LOWEST DETECTABLE LIMITS FOR URINE DRUG SCREEN Drug Class       Cutoff (ng/mL) Amphetamine      1000 Barbiturate      200 Benzodiazepine   335 Tricyclics       456 Opiates          300 Cocaine          300 THC              50   Comprehensive metabolic panel     Status: Abnormal   Collection Time: 06/17/16  6:48 PM  Result Value Ref Range   Sodium 138 135 - 145 mmol/L   Potassium 2.6 (LL) 3.5  - 5.1 mmol/L    Comment: CRITICAL RESULT CALLED TO, READ BACK BY AND VERIFIED WITH: DREWERY,K AT 1933 ON 06/17/16 BY MOSLEY,J    Chloride 102 101 - 111 mmol/L   CO2 24 22 - 32 mmol/L   Glucose, Bld 157 (H) 65 - 99 mg/dL   BUN 6 6 - 20 mg/dL   Creatinine, Ser 0.70 0.44 - 1.00 mg/dL   Calcium 9.4 8.9 - 10.3 mg/dL   Total Protein 7.9 6.5 - 8.1 g/dL   Albumin 4.5 3.5 - 5.0 g/dL   AST 21 15 - 41 U/L   ALT 27 14 - 54 U/L   Alkaline Phosphatase 74 38 -  126 U/L   Total Bilirubin 0.3 0.3 - 1.2 mg/dL   GFR calc non Af Amer >60 >60 mL/min   GFR calc Af Amer >60 >60 mL/min    Comment: (NOTE) The eGFR has been calculated using the CKD EPI equation. This calculation has not been validated in all clinical situations. eGFR's persistently <60 mL/min signify possible Chronic Kidney Disease.    Anion gap 12 5 - 15  Ethanol     Status: None   Collection Time: 06/17/16  6:48 PM  Result Value Ref Range   Alcohol, Ethyl (B) <5 <5 mg/dL    Comment:        LOWEST DETECTABLE LIMIT FOR SERUM ALCOHOL IS 5 mg/dL FOR MEDICAL PURPOSES ONLY   Salicylate level     Status: None   Collection Time: 06/17/16  6:48 PM  Result Value Ref Range   Salicylate Lvl <6.9 2.8 - 30.0 mg/dL  Acetaminophen level     Status: Abnormal   Collection Time: 06/17/16  6:48 PM  Result Value Ref Range   Acetaminophen (Tylenol), Serum <10 (L) 10 - 30 ug/mL    Comment:        THERAPEUTIC CONCENTRATIONS VARY SIGNIFICANTLY. A RANGE OF 10-30 ug/mL MAY BE AN EFFECTIVE CONCENTRATION FOR MANY PATIENTS. HOWEVER, SOME ARE BEST TREATED AT CONCENTRATIONS OUTSIDE THIS RANGE. ACETAMINOPHEN CONCENTRATIONS >150 ug/mL AT 4 HOURS AFTER INGESTION AND >50 ug/mL AT 12 HOURS AFTER INGESTION ARE OFTEN ASSOCIATED WITH TOXIC REACTIONS.   cbc     Status: None   Collection Time: 06/17/16  6:48 PM  Result Value Ref Range   WBC 6.2 4.0 - 10.5 K/uL   RBC 4.77 3.87 - 5.11 MIL/uL   Hemoglobin 14.1 12.0 - 15.0 g/dL   HCT 40.4 36.0 - 46.0 %   MCV  84.7 78.0 - 100.0 fL   MCH 29.6 26.0 - 34.0 pg   MCHC 34.9 30.0 - 36.0 g/dL   RDW 13.2 11.5 - 15.5 %   Platelets 267 150 - 400 K/uL    Current Facility-Administered Medications  Medication Dose Route Frequency Provider Last Rate Last Dose  . acetaminophen (TYLENOL) tablet 650 mg  650 mg Oral Q4H PRN Malvin Johns, MD      . fluPHENAZine (PROLIXIN) tablet 2.5 mg  2.5 mg Oral QHS Ennis Heavner, MD      . gabapentin (NEURONTIN) capsule 300 mg  300 mg Oral BID Corena Pilgrim, MD      . lisinopril-hydrochlorothiazide (PRINZIDE,ZESTORETIC) 10-12.5 MG per tablet 1 tablet  1 tablet Oral Daily Malvin Johns, MD   Stopped at 06/18/16 [phone]  . LORazepam (ATIVAN) tablet 1 mg  1 mg Oral Q8H PRN Malvin Johns, MD      . metFORMIN (GLUCOPHAGE) tablet 500 mg  500 mg Oral BID WC Malvin Johns, MD   Stopped at 06/18/16 2841  . metoprolol succinate (TOPROL-XL) 24 hr tablet 25 mg  25 mg Oral Daily Malvin Johns, MD      . pravastatin (PRAVACHOL) tablet 40 mg  40 mg Oral Daily Malvin Johns, MD      . Derrill Memo ON 06/19/2016] venlafaxine XR (EFFEXOR-XR) 24 hr capsule 150 mg  150 mg Oral Q1200 Corena Pilgrim, MD       Current Outpatient Prescriptions  Medication Sig Dispense Refill  . omeprazole (PRILOSEC) 20 MG capsule Take 20 mg by mouth daily as needed for heartburn.    . cetirizine (ZYRTEC) 10 MG tablet Take 1 tablet (10 mg total) by mouth daily as needed  for allergies. 30 tablet 3  . cyanocobalamin 1000 MCG tablet TAKE ONE (1) TABLET BY MOUTH EVERY DAY 90 tablet 1  . cyclobenzaprine (FLEXERIL) 10 MG tablet Take 1 tablet (10 mg total) by mouth 3 (three) times daily as needed for muscle spasms. 12 tablet 0  . ferrous sulfate 325 (65 FE) MG tablet TAKE ONE (1) TABLET BY MOUTH 3 TIMES DAILY WITH MEALS 100 tablet 0  . fluticasone (FLONASE) 50 MCG/ACT nasal spray Place 1 spray into both nostrils daily. 16 g 2  . ibuprofen (ADVIL,MOTRIN) 800 MG tablet Take 1 tablet (800 mg total) by mouth every 8 (eight) hours as  needed for mild pain or moderate pain. 15 tablet 0  . lisinopril-hydrochlorothiazide (PRINZIDE,ZESTORETIC) 10-12.5 MG tablet TAKE ONE (1) TABLET BY MOUTH EVERY DAY 30 tablet 1  . metFORMIN (GLUCOPHAGE) 500 MG tablet TAKE ONE (1) TABLET BY MOUTH TWO (2) TIMES DAILY WITH A MEAL 60 tablet 1  . metoprolol succinate (TOPROL-XL) 25 MG 24 hr tablet Take 1 tablet (25 mg total) by mouth daily. 90 tablet 3  . pravastatin (PRAVACHOL) 40 MG tablet Take 1 tablet (40 mg total) by mouth daily. 90 tablet 3  . promethazine (PHENERGAN) 12.5 MG tablet Take 1 tablet (12.5 mg total) by mouth every 6 (six) hours as needed for nausea or vomiting. 30 tablet 0  . QUEtiapine (SEROQUEL XR) 50 MG TB24 24 hr tablet Take 50 mg by mouth daily.    [name] venlafaxine XR (EFFEXOR-XR) 75 MG 24 hr capsule Take 75-150 mg by mouth 2 (two) times daily. TAKE ONE CAPSULE BY MOUTH IN THE MORNING AND TWO AT BEDTIME per patient      Musculoskeletal: Strength & Muscle Tone: within normal limits Gait & Station: normal Patient leans: N/A  Psychiatric Specialty Exam: Physical Exam  Psychiatric: Her speech is normal. Judgment normal. Her mood appears anxious. She is actively hallucinating. Thought content is paranoid. Cognition and memory are normal.    Review of Systems  Constitutional: Negative.   HENT: Negative.   Eyes: Negative.   Respiratory: Negative.   Cardiovascular: Negative.   Gastrointestinal: Negative.   Genitourinary: Negative.   Musculoskeletal: Negative.   Skin: Negative.   Neurological: Negative.   Endo/Heme/Allergies: Negative.   Psychiatric/Behavioral: Positive for hallucinations.    Blood pressure 127/77, pulse 95, temperature 98.1 F (36.7 C), temperature source Oral, resp. rate 18, last menstrual period 07/23/2015, SpO2 99 %.There is no height or weight on file to calculate BMI.  General Appearance: Casual  Eye Contact:  Good  Speech:  Clear and Coherent  Volume:  Normal  Mood:  Euthymic  Affect:  Constricted   Thought Process:  Coherent and Descriptions of Associations: Intact  Orientation:  Full (Time, Place, and Person)  Thought Content:  Logical  Suicidal Thoughts:  No  Homicidal Thoughts:  No  Memory:  Immediate;   Fair Recent;   Fair Remote;   Good  Judgement:  Fair  Insight:  Fair  Psychomotor Activity:  Normal  Concentration:  Concentration: Fair and Attention Span: Fair  Recall:  [employer] of Knowledge:  Good  Language:  Good  Akathisia:  No  Handed:  Right  AIMS (if indicated):     Assets:  Communication Skills Desire for Improvement  ADL's:  Intact  Cognition:  WNL  Sleep:   fair     Treatment Plan Summary: -Crisis stabilization -Continue Effexor XR 150 mg daily for depression. -Start Prolixin 2.5 mg qhs for psychosis -Start Gabapentin 300  mg bid for mood stabilization. Daily contact with patient to assess and evaluate symptoms and progress in treatment, Medication management and Plan ggg  Disposition: No evidence of imminent risk to self or others at present.   Patient does not meet criteria for psychiatric inpatient admission. Supportive therapy provided about ongoing stressors. Patient is cleared by psychiatric servive, she will continued to receive [facility] from [facility] upon discharge.  Corena Pilgrim, MD 06/18/2016 10:12 AM

## 2016-06-18 NOTE — Discharge Instructions (Signed)
For your ongoing mental health needs, you are advised to continue treatment with the [facility] ACT Team, your outpatient provider:       [facility]      [address]      [place], [place] [address]      [phone]

## 2016-06-18 NOTE — BH Assessment (Signed)
[place] Assessment Progress Note  Per Corena Pilgrim, MD, this pt is psychiatrically cleared.  Pt is seen by [facility] Team for outpatient treatment.  Discharge instructions advise pt to continue treatment with this provider.  Pt's nurse has been notified.  Jalene Mullet, [place] Triage Specialist [phone]

## 2016-06-18 NOTE — BH Assessment (Addendum)
Assessment Note  [name] is an 45 y.o. female presenting to [facility] as a referral from [facility]. Patient reused to participate in treatment and was placed under IVC by EDP.   Assessment from [facility] states:  "The ct is a 45 year old AA female who appeared at her family home last night stating that someone is trying to do some witch craft on her and there are people trying to kill.  The Ct's son contacted [facility] stating that he has tried to get her to [facility] but she will not go. The QP Kellier Retterer dispatched [employer] BSW QP to the home. The QP made contact with the Ct's son who was monitoring his mother for safety.  The QP contacted [facility] and spoke with Stanton Kidney who advised that the Ct is not any enhanced services. The QP arrived at the home and met with the Ct and her son. The Ct denies any active SI but the son states that his mother stated she is done with life. The Ct states she does have a history of suicide attempts when she is stressed out.  The Ct also states that she is will mess with someone if they don't stop messing with her. The Ct believes that someone is messing with her and she is not sleeping and not eating. The Ct states that she believes that her neighbors were doing witch craft on her and trying to kill her. She also states that they were bothering her. The Ct states that she is hearing voices and they are telling her what they are going to do and what others are doing. The Ct is currently laying under a blanket in the home and she gets upset. The Ct states she has HBP, and diabetes along with other health problems. The Ct also states that the devil is in her home and the Ct is also not sure her son is her son and she does not trust him. While in the home the Ct states that she feels that someone is trying to harm. The Ct states she will go to the hospital as long no one hurts and she will not take anything to harm her."  Patient would not verify any  information or answer any questions. Patient states that "y'all not doing your job and I need y'all to do your job." Patient has a history of Schizoaffective Disorder per chart review.    Consulted with Darlyne Russian, PA-C who recommends overnight observation and evaluation by psychiatry in the morning.    Diagnosis: Schizoaffective Disorder  Past Medical History:  Past Medical History:  Diagnosis Date  . Anemia   . Congenital cerebral palsy ([place])   . Depression   . Diabetes mellitus due to abnormal insulin ([place])   . Endometriosis   . Hearing impairment   . Heart murmur    at birth  . Hypertension   . Neuromuscular disorder ([place])    Cerebral palsy- very mild left side of brain  . Schizoaffective disorder [facility])     Past Surgical History:  Procedure Laterality Date  . CESAREAN SECTION    . EXPLORATORY LAPAROTOMY WITH ABDOMINAL MASS EXCISION    . MYOMECTOMY N/A 08/05/2015   Procedure: Abdominal Hysterectomy, Bilateral Salpingectomy;  Surgeon: Osborne Oman, MD;  Location: [facility];  Service: Gynecology;  Laterality: N/A;    Family History:  Family History  Problem Relation Age of Onset  . Heart failure Mother   . Hypertension Mother   .  Diabetes Mellitus II Mother   . Cancer Mother   . Diabetes Mellitus II Father   . Hypertension Father     Social History:  reports that she has never smoked. She has never used smokeless tobacco. She reports that she does not drink alcohol or use drugs.  Additional Social History:  Alcohol / Drug Use Pain Medications: UTA Prescriptions: UTA Over the Counter: UTA History of alcohol / drug use?:  (UTA)  CIWA: CIWA-Ar BP: 117/82 Pulse Rate: 105 COWS:    Allergies:  Allergies  Allergen Reactions  . Bactrim [Sulfamethoxazole-Trimethoprim] Anaphylaxis, Shortness Of Breath and Swelling  . Ivp Dye [Iodinated Diagnostic Agents] Nausea And Vomiting  . Pollen Extract     Seasonal allergies  . Sulfa Antibiotics Swelling    Eyes and lips  swelled up    Home Medications:  (Not in a hospital admission)  OB/GYN Status:  Patient's last menstrual period was 07/23/2015 (exact date).  General Assessment Data [facility] TTS Assessment: In system Is this a Tele or Face-to-Face Assessment?: Face-to-Face Is this an Initial Assessment or a Re-assessment for this encounter?: Initial Assessment Marital status:  (UTA) Is patient pregnant?: Unknown Pregnancy Status: Unknown Living Arrangements: Other (Comment) (UTA) Can pt return to current living arrangement?:  (UTA) Admission Status: Involuntary Referral Source: Other [facility])     Crisis Care Plan Living Arrangements: Other (Comment) (UTA) Legal Guardian:  (UTA) Name of Psychiatrist:  (UTA) Name of Therapist:  (UTA)  Education Status Is patient currently in school?:  (UTA) Highest grade of school patient has completed:  (UTA)  Risk to self with the past 6 months Suicidal Ideation:  (UTA) Has patient been a risk to self within the past 6 months prior to admission? :  (UTA) Suicidal Intent:  (UTA) Has patient had any suicidal intent within the past 6 months prior to admission? :  (UTA) Is patient at risk for suicide?:  (UTA) Suicidal Plan?:  (UTA) Has patient had any suicidal plan within the past 6 months prior to admission? :  (UTA) Access to Means:  (UTA) What has been your use of drugs/alcohol within the last 12 months?:  (UTA) Previous Attempts/Gestures:  (UTA) How many times?:  (UTA) Other Self Harm Risks:  (UTA) Triggers for Past Attempts:  (UTA) Intentional Self Injurious Behavior:  (UTA) Family Suicide History:  (UTA) Recent stressful life event(s):  (UTA) Persecutory voices/beliefs?:  Pincus Badder) Depression:  (UTA) Depression Symptoms:  (UTA) Substance abuse history and/or treatment for substance abuse?:  (UTA) Suicide prevention information given to non-admitted patients:  (UTA)  Risk to Others within the past 6 months Homicidal  Ideation:  (UTA) Does patient have any lifetime risk of violence toward others beyond the six months prior to admission? :  (UTA) Thoughts of Harm to Others:  (UTA) Current Homicidal Intent:  (UTA) Current Homicidal Plan:  (UTA) Access to Homicidal Means:  (UTA) Identified Victim:  (UTA) History of harm to others?:  (UTA) Assessment of Violence:  (UTA) Violent Behavior Description:  (UTA) Does patient have access to weapons?:  (UTA) Criminal Charges Pending?:  (UTA) Does patient have a court date:  (UTA) Is patient on probation?:  (UTA)  Psychosis Hallucinations:  (UTA) Delusions:  (UTA)  Mental Status Report Appearance/Hygiene: In scrubs Eye Contact: Good Motor Activity: Unable to assess Speech: Argumentative Level of Consciousness: Irritable Mood: Angry Affect: Angry Anxiety Level: None Thought Processes: Circumstantial Judgement: Impaired Orientation: Unable to assess Obsessive Compulsive Thoughts/Behaviors: Unable to Assess  Cognitive  Functioning Concentration: Unable to Assess Memory: Unable to Assess IQ: Average Insight: Unable to Assess Impulse Control: Unable to Assess Appetite:  (UTA) Sleep: Unable to Assess Vegetative Symptoms: Unable to Assess  ADLScreening [facility] Assessment Services) Patient's cognitive ability adequate to safely complete daily activities?: Yes Patient able to express need for assistance with ADLs?:  (UTA)  Prior Inpatient Therapy Prior Inpatient Therapy:  (UTA) Prior Therapy Dates:  (UTA) Prior Therapy Facilty/Provider(s):  (UTA) Reason for Treatment:  (UTA)  Prior Outpatient Therapy Prior Outpatient Therapy:  (UTA) Prior Therapy Dates:  (UTA) Prior Therapy Facilty/Provider(s):  (UTA) Reason for Treatment:  (UTA) Does patient have an ACCT team?:  (UTA) Does patient have Intensive In-House Services?  : No Does patient have [facility] services? : Unknown Does patient have P4CC services?: Unknown  ADL Screening (condition at time of  admission) Patient's cognitive ability adequate to safely complete daily activities?: Yes Is the patient deaf or have difficulty hearing?:  (UTA) Does the patient have difficulty seeing, even when wearing glasses/contacts?:  (UTA) Does the patient have difficulty concentrating, remembering, or making decisions?:  (UTA) Patient able to express need for assistance with ADLs?:  (UTA) Does the patient have difficulty dressing or bathing?:  (UTA) Does the patient have difficulty walking or climbing stairs?:  (UTA) Weakness of Legs:  (UTA) Weakness of Arms/Hands:  (UTA)  Home Assistive Devices/Equipment Home Assistive Devices/Equipment:  (UTA)    Abuse/Neglect Assessment (Assessment to be complete while patient is alone) Physical Abuse:  (UTA) Verbal Abuse:  (UTA) Sexual Abuse:  (UTA) Exploitation of patient/patient's resources:  (UTA) Self-Neglect:  (UTA) Values / Beliefs Cultural Requests During Hospitalization:  (UTA) Spiritual Requests During Hospitalization:  (UTA)   Advance Directives (For Healthcare) Does patient have an advance directive?:  (UTA) Would patient like information on creating an advanced directive?:  (UTA)    Additional Information 1:1 In Past 12 Months?: No CIRT Risk: No Elopement Risk: No Does patient have medical clearance?: No     Disposition:  Disposition Initial Assessment Completed for this Encounter: Yes Disposition of Patient: Other dispositions (observe overnight per Darlyne Russian, PA-C) Other disposition(s): Other (Comment)  On Site Evaluation by:   Reviewed with Physician:    Catherine Oak 06/18/2016 1:05 AM

## 2016-06-18 NOTE — ED Notes (Signed)
Per pharmacy hold all medications until pharmacy able to verify pt medications with son.

## 2016-06-18 NOTE — BHH Suicide Risk Assessment (Signed)
Suicide Risk Assessment  Discharge Assessment   [facility] Discharge Suicide Risk Assessment   Principal Problem: Schizoaffective disorder, chronic condition [facility]) Discharge Diagnoses:  Patient Active Problem List   Diagnosis Date Noted  . Schizoaffective disorder, chronic condition ([place]) [F25.8] 06/18/2016  . Sinus headache [R51] 01/20/2016  . Anemia due to chronic blood loss [D50.0]   . Hyperlipidemia LDL goal <70 [E78.5] 09/10/2014  . Thyroid nodule [E04.1] 07/30/2014  . Morbid obesity ([place]) [E66.01] 07/30/2014  . Depression, controlled [F32.9] 07/30/2014  . Preventative health care [Z00.00] 07/30/2014  . Essential hypertension [I10] 11/07/2012  . Type 2 diabetes mellitus ([place]) [E11.9] 11/07/2012  . LEARNING DISABILITY [F81.89] 09/26/2007  . FIBROCYSTIC BREAST DISEASE [N60.19] 09/26/2007  . ENDOMETRIOSIS [N80.9] 09/26/2007    Total Time spent with patient: 45 minutes  Musculoskeletal: Strength & Muscle Tone: within normal limits Gait & Station: normal Patient leans: N/A  Psychiatric Specialty Exam:   Blood pressure 127/77, pulse 95, temperature 98.1 F (36.7 C), temperature source Oral, resp. rate 18, last menstrual period 07/23/2015, SpO2 99 %.There is no height or weight on file to calculate BMI.  General Appearance: Casual  Eye Contact::  Good  Speech:  Normal Rate409  Volume:  Normal  Mood:  Euthymic  Affect:  Congruent  Thought Process:  Coherent and Descriptions of Associations: Intact  Orientation:  Full (Time, Place, and Person)  Thought Content:  WDL  Suicidal Thoughts:  No  Homicidal Thoughts:  No  Memory:  Immediate;   Good Recent;   Good Remote;   Good  Judgement:  Fair  Insight:  Fair  Psychomotor Activity:  Normal  Concentration:  Good  Recall:  Good  Fund of Knowledge:Fair  Language: Good  Akathisia:  No  Handed:  Right  AIMS (if indicated):     Assets:  Housing Leisure Time Physical Health Resilience Social Support  Sleep:     Cognition:  WNL  ADL's:  Intact   Mental Status Per Nursing Assessment::   On Admission:   auditory hallucinations  Demographic Factors:  NA  Loss Factors: NA  Historical Factors: NA  Risk Reduction Factors:   Responsible for children under 50 years of age, Sense of responsibility to family, Living with another person, especially a relative, Positive social support and Positive therapeutic relationship  Continued Clinical Symptoms:  None  Cognitive Features That Contribute To Risk:  None    Suicide Risk:  Minimal: No identifiable suicidal ideation.  Patients presenting with no risk factors but with morbid ruminations; may be classified as minimal risk based on the severity of the depressive symptoms    Plan Of Care/Follow-up recommendations:  Activity:  as tolerated Diet:  heart healthy diet  Michalle Rademaker, NP 06/18/2016, 10:22 AM

## 2016-06-19 ENCOUNTER — Encounter (HOSPITAL_COMMUNITY): Payer: Self-pay | Admitting: Emergency Medicine

## 2016-06-19 ENCOUNTER — Emergency Department (HOSPITAL_COMMUNITY)
Admission: EM | Admit: 2016-06-19 | Discharge: 2016-06-21 | Disposition: A | Discharge status: Other Acute Inpatient Hospital | Payer: Medicare Other | Attending: Emergency Medicine | Admitting: Emergency Medicine

## 2016-06-19 DIAGNOSIS — E119 Type 2 diabetes mellitus without complications: Secondary | ICD-10-CM | POA: Insufficient documentation

## 2016-06-19 DIAGNOSIS — I10 Essential (primary) hypertension: Secondary | ICD-10-CM | POA: Insufficient documentation

## 2016-06-19 DIAGNOSIS — Z79899 Other long term (current) drug therapy: Secondary | ICD-10-CM | POA: Diagnosis not present

## 2016-06-19 DIAGNOSIS — Z7984 Long term (current) use of oral hypoglycemic drugs: Secondary | ICD-10-CM | POA: Insufficient documentation

## 2016-06-19 DIAGNOSIS — F259 Schizoaffective disorder, unspecified: Secondary | ICD-10-CM | POA: Diagnosis present

## 2016-06-19 DIAGNOSIS — Z046 Encounter for general psychiatric examination, requested by authority: Secondary | ICD-10-CM | POA: Diagnosis present

## 2016-06-19 DIAGNOSIS — F258 Other schizoaffective disorders: Secondary | ICD-10-CM

## 2016-06-19 LAB — COMPREHENSIVE METABOLIC PANEL
ALBUMIN: 4.5 g/dL (ref 3.5–5.0)
ALK PHOS: 77 U/L (ref 38–126)
ALT: 24 U/L (ref 14–54)
ANION GAP: 12 (ref 5–15)
AST: 21 U/L (ref 15–41)
BUN: 10 mg/dL (ref 6–20)
CALCIUM: 9.5 mg/dL (ref 8.9–10.3)
CO2: 23 mmol/L (ref 22–32)
Chloride: 98 mmol/L — ABNORMAL LOW (ref 101–111)
Creatinine, Ser: 0.85 mg/dL (ref 0.44–1.00)
GFR calc non Af Amer: >60 mL/min (ref >60)
GLUCOSE: 185 mg/dL — AB (ref 65–99)
POTASSIUM: 3.1 mmol/L — AB (ref 3.5–5.1)
SODIUM: 133 mmol/L — AB (ref 135–145)
Total Bilirubin: 0.5 mg/dL (ref 0.3–1.2)
Total Protein: 7.8 g/dL (ref 6.5–8.1)

## 2016-06-19 LAB — RAPID URINE DRUG SCREEN, HOSP PERFORMED
Amphetamines: NOT DETECTED
BENZODIAZEPINES: NOT DETECTED
Barbiturates: NOT DETECTED
COCAINE: NOT DETECTED
OPIATES: NOT DETECTED
TETRAHYDROCANNABINOL: NOT DETECTED

## 2016-06-19 LAB — CBC
HEMATOCRIT: 41.6 % (ref 36.0–46.0)
HEMOGLOBIN: 14.4 g/dL (ref 12.0–15.0)
MCH: 29.4 pg (ref 26.0–34.0)
MCHC: 34.6 g/dL (ref 30.0–36.0)
MCV: 84.9 fL (ref 78.0–100.0)
Platelets: 260 10*3/uL (ref 150–400)
RBC: 4.9 MIL/uL (ref 3.87–5.11)
RDW: 13.3 % (ref 11.5–15.5)
WBC: 8.9 10*3/uL (ref 4.0–10.5)

## 2016-06-19 LAB — ETHANOL: Alcohol, Ethyl (B): <5 mg/dL (ref <5)

## 2016-06-19 LAB — SALICYLATE LEVEL

## 2016-06-19 LAB — ACETAMINOPHEN LEVEL

## 2016-06-19 MED ORDER — LORAZEPAM 2 MG/ML IJ SOLN
2.0000 mg | Freq: Once | INTRAMUSCULAR | Status: DC
Start: 2016-06-19 — End: 2016-06-21

## 2016-06-19 MED ORDER — FLUPHENAZINE HCL 2.5 MG PO TABS
2.5000 mg | ORAL_TABLET | Freq: Every day | ORAL | Status: DC
  Filled 2016-06-19 (×2): qty 1

## 2016-06-19 MED ORDER — GABAPENTIN 300 MG PO CAPS
300.0000 mg | ORAL_CAPSULE | Freq: Two times a day (BID) | ORAL | Status: DC
  Filled 2016-06-19 (×2): qty 1

## 2016-06-19 MED ORDER — LISINOPRIL-HYDROCHLOROTHIAZIDE 10-12.5 MG PO TABS: 1.0000 | ORAL_TABLET | Freq: Every day | ORAL | Status: DC

## 2016-06-19 MED ORDER — METFORMIN HCL 500 MG PO TABS
500.0000 mg | ORAL_TABLET | Freq: Two times a day (BID) | ORAL | Status: DC
  Administered 2016-06-20 – 2016-06-21 (×2): 500 mg via ORAL
  Filled 2016-06-19 (×4): qty 1

## 2016-06-19 MED ORDER — POTASSIUM CHLORIDE CRYS ER 20 MEQ PO TBCR: 40.0000 meq | EXTENDED_RELEASE_TABLET | Freq: Once | ORAL | Status: DC

## 2016-06-19 MED ORDER — LISINOPRIL 10 MG PO TABS
10.0000 mg | ORAL_TABLET | Freq: Every day | ORAL | Status: DC
  Administered 2016-06-20 – 2016-06-21 (×2): 10 mg via ORAL
  Filled 2016-06-19 (×3): qty 1

## 2016-06-19 MED ORDER — ZIPRASIDONE MESYLATE 20 MG IM SOLR
20.0000 mg | Freq: Once | INTRAMUSCULAR | Status: AC
  Administered 2016-06-19: 20 mg via INTRAMUSCULAR
  Filled 2016-06-19: qty 20

## 2016-06-19 MED ORDER — VENLAFAXINE HCL ER 150 MG PO CP24
150.0000 mg | ORAL_CAPSULE | Freq: Every day | ORAL | Status: DC
  Administered 2016-06-20 – 2016-06-21 (×2): 150 mg via ORAL
  Filled 2016-06-19 (×2): qty 1

## 2016-06-19 MED ORDER — HYDROCHLOROTHIAZIDE 12.5 MG PO CAPS
12.5000 mg | ORAL_CAPSULE | Freq: Every day | ORAL | Status: DC
  Administered 2016-06-20: 12.5 mg via ORAL
  Filled 2016-06-19 (×3): qty 1

## 2016-06-19 MED ORDER — STERILE WATER FOR INJECTION IJ SOLN
INTRAMUSCULAR | Status: AC
  Administered 2016-06-19: 10 mL
  Filled 2016-06-19: qty 10

## 2016-06-19 MED ORDER — LORAZEPAM 1 MG PO TABS: 1.0000 mg | ORAL_TABLET | Freq: Three times a day (TID) | ORAL | Status: DC | PRN

## 2016-06-19 NOTE — ED Notes (Addendum)
Pt reported to previous Rn that she had vomited into the trash bag. Writer asked pt if she needed anything or about other symptoms, pt denied, rolled over and stated that if she needed sometihng she would come and ask. Pt denied pain SI, HI, A/ V H, at this time.  Pt refused oral  K +

## 2016-06-19 NOTE — ED Notes (Signed)
MOTHER/Deborah Bradt [phone] SISTER/Tometta Camra [phone]  MOTHER request staff to please call prior to pt discharge.

## 2016-06-19 NOTE — ED Notes (Addendum)
Patient is visibly agitated, yelling, and verbally aggressive at this time.  Unable to obtain vital signs, patient has refused

## 2016-06-19 NOTE — ED Notes (Signed)
Patient and belongings have been wanded and secured

## 2016-06-19 NOTE — ED Notes (Signed)
Bed: [id number] Expected date:  Expected time:  Means of arrival:  Comments: Triage 4 

## 2016-06-19 NOTE — ED Notes (Signed)
Pt refused all medications at this time.

## 2016-06-19 NOTE — BH Assessment (Signed)
Attempted tele-assessment. Pt was unable to even confirm her name and date of birth. She was awake but appeared unwilling or unable to answer questions. TTS will try again when Pt is willing to participate.  Orpah Greek Anson Fret, LPC, [facility], [facility] Triage Specialist [phone]

## 2016-06-19 NOTE — ED Notes (Signed)
Unable to collect labs because of patient behavior 

## 2016-06-19 NOTE — ED Notes (Signed)
Matt Quick verbalizes pt and belongings transferred to [employer] [facility].

## 2016-06-19 NOTE — ED Notes (Signed)
Pt refused EKG at this time.

## 2016-06-19 NOTE — ED Provider Notes (Signed)
[facility] DEPT Provider Note   CSN: [id number] Arrival date & time: 06/19/16  1545  First Provider Contact:  None       History   Chief Complaint No chief complaint on file.   HPI [name] is a 45 y.o. female.  HPI   Patient is a 45 year old female with a history of schizoaffective disorder who presents to the emergency department for IVC via her mother. Mom states patient has been off her meds since February and she's been "acting up". She was released from [facility] yesterday but did not fill her prescriptions. Mother states she was at [employer] since 10 AM this morning. The mother called the crisis number and a crisis counselor came to speak with the patient and the pt threatened the crisis counselor. [employer] was called and the patient was brought to the ED. Patient believes someone is trying to kill her. Mom states she gets her medications from [facility]. Patient has been aggressive towards her sister that she thinks her sister is trying to kill her. The mother states the patient hears voices. She states the past the voices told the patient to kill herself and the patient has tried on numerous occasions to kill herself. She states she feels physically fine. She would not answer my questions. Unable to assess if the patient is homicidal or suicidal.   Patient Active Problem List   Diagnosis Date Noted  . Schizoaffective disorder, chronic condition ([place]) 06/18/2016  . Sinus headache 01/20/2016  . Anemia due to chronic blood loss   . Hyperlipidemia LDL goal <70 09/10/2014  . Thyroid nodule 07/30/2014  . Morbid obesity ([place]) 07/30/2014  . Depression, controlled 07/30/2014  . Preventative health care 07/30/2014  . Essential hypertension 11/07/2012  . Type 2 diabetes mellitus ([place]) 11/07/2012  . LEARNING DISABILITY 09/26/2007  . FIBROCYSTIC BREAST DISEASE 09/26/2007  . ENDOMETRIOSIS 09/26/2007    Past Surgical History:  Procedure Laterality Date  . CESAREAN SECTION    .  EXPLORATORY LAPAROTOMY WITH ABDOMINAL MASS EXCISION    . MYOMECTOMY N/A 08/05/2015   Procedure: Abdominal Hysterectomy, Bilateral Salpingectomy;  Surgeon: Osborne Oman, MD;  Location: [facility];  Service: Gynecology;  Laterality: N/A;    OB History    Gravida Para Term Preterm AB Living   1 1 1     1    SAB TAB Ectopic Multiple Live Births                   Home Medications    Prior to Admission medications   Medication Sig Start Date End Date Taking? Authorizing Provider  cetirizine (ZYRTEC) 10 MG tablet Take 1 tablet (10 mg total) by mouth daily as needed for allergies. 01/20/16   Loleta Chance, MD  cyanocobalamin 1000 MCG tablet TAKE ONE (1) TABLET BY MOUTH EVERY DAY Patient taking differently: Take 1,000 mcg by mouth daily. TAKE ONE (1) TABLET BY MOUTH EVERY DAY 09/16/15   Juliet Rude, MD  cyclobenzaprine (FLEXERIL) 10 MG tablet Take 1 tablet (10 mg total) by mouth 3 (three) times daily as needed for muscle spasms. 09/19/15   Clayton Bibles, PA-C  ferrous sulfate 325 (65 FE) MG tablet TAKE ONE (1) TABLET BY MOUTH 3 TIMES DAILY WITH MEALS 11/04/15   Juliet Rude, MD  fluPHENAZine (PROLIXIN) 2.5 MG tablet Take 1 tablet (2.5 mg total) by mouth at bedtime. 06/18/16   Patrecia Pour, NP  fluticasone (FLONASE) 50 MCG/ACT nasal spray Place 1 spray into both nostrils daily.  01/20/16 01/19/17  Loleta Chance, MD  gabapentin (NEURONTIN) 300 MG capsule Take 1 capsule (300 mg total) by mouth 2 (two) times daily. 06/18/16   Patrecia Pour, NP  ibuprofen (ADVIL,MOTRIN) 800 MG tablet Take 1 tablet (800 mg total) by mouth every 8 (eight) hours as needed for mild pain or moderate pain. 09/19/15   Clayton Bibles, PA-C  lisinopril-hydrochlorothiazide (PRINZIDE,ZESTORETIC) 10-12.5 MG tablet TAKE ONE (1) TABLET BY MOUTH EVERY DAY 05/24/16   Carly J Rivet, MD  metFORMIN (GLUCOPHAGE) 500 MG tablet TAKE ONE (1) TABLET BY MOUTH TWO (2) TIMES DAILY WITH A MEAL 03/16/16   Juliet Rude, MD  metoprolol succinate (TOPROL-XL) 25 MG 24 hr  tablet Take 1 tablet (25 mg total) by mouth daily. 08/18/15   Juliet Rude, MD  omeprazole (PRILOSEC) 20 MG capsule Take 20 mg by mouth daily as needed (acid).  06/06/16   Historical Provider, MD  pravastatin (PRAVACHOL) 40 MG tablet Take 1 tablet (40 mg total) by mouth daily. 08/18/15   Carly Montey Hora, MD  promethazine (PHENERGAN) 12.5 MG tablet Take 1 tablet (12.5 mg total) by mouth every 6 (six) hours as needed for nausea or vomiting. 09/16/15   Juliet Rude, MD  QUEtiapine (SEROQUEL XR) 50 MG TB24 24 hr tablet Take 50 mg by mouth daily.    Historical Provider, MD  venlafaxine XR (EFFEXOR-XR) 150 MG 24 hr capsule Take 1 capsule (150 mg total) by mouth daily. 06/18/16   Patrecia Pour, NP  venlafaxine XR (EFFEXOR-XR) 75 MG 24 hr capsule Take 75-150 mg by mouth 2 (two) times daily. TAKE ONE CAPSULE BY MOUTH IN THE MORNING AND TWO AT BEDTIME per patient 02/03/12   Patrick Jupiter, PA-C    Family History Family History  Problem Relation Age of Onset  . Heart failure Mother   . Hypertension Mother   . Diabetes Mellitus II Mother   . Cancer Mother   . Diabetes Mellitus II Father   . Hypertension Father     Social History Social History  Substance Use Topics  . Smoking status: Never Smoker  . Smokeless tobacco: Never Used  . Alcohol use No     Allergies   Bactrim [sulfamethoxazole-trimethoprim]; Ivp dye [iodinated diagnostic agents]; Pollen extract; and Sulfa antibiotics   Review of Systems Review of Systems  Unable to perform ROS: Psychiatric disorder     Physical Exam Updated Vital Signs BP 104/62 (BP Location: Left Arm)   Pulse 115   Temp 98.3 F (36.8 C) (Oral)   Resp 16   LMP 07/23/2015 (Exact Date)   SpO2 97%   Physical Exam  Constitutional: She appears well-developed and well-nourished. No distress.  HENT:  Head: Normocephalic and atraumatic.  Eyes: Conjunctivae are normal.  Neck: Normal range of motion.  Cardiovascular: Regular rhythm and normal heart sounds.   Tachycardia present.  Exam reveals no gallop and no friction rub.   No murmur heard. Pulmonary/Chest: Effort normal and breath sounds normal. No respiratory distress. She has no wheezes. She has no rales.  Musculoskeletal: Normal range of motion.  Neurological: She is alert. Coordination normal.  Skin: Skin is warm and dry. She is not diaphoretic.  Psychiatric: Her affect is angry. Her speech is rapid and/or pressured. She is agitated and combative. Thought content is delusional.  Nursing note and vitals reviewed.    ED Treatments / Results  Labs (all labs ordered are listed, but only abnormal results are displayed) Labs Reviewed  CBC  COMPREHENSIVE  METABOLIC PANEL  ETHANOL  SALICYLATE LEVEL  ACETAMINOPHEN LEVEL  URINE RAPID DRUG SCREEN, HOSP PERFORMED    EKG  EKG Interpretation None       Radiology No results found.  Procedures Procedures (including critical care time)  Medications Ordered in ED Medications  LORazepam (ATIVAN) injection 2 mg (not administered)  ziprasidone (GEODON) injection 20 mg (20 mg Intramuscular Given 06/19/16 1716)  sterile water (preservative free) injection (10 mLs  Given 06/19/16 1716)     Initial Impression / Assessment and Plan / ED Course  I have reviewed the triage vital signs and the nursing notes.  Pertinent labs & imaging results that were available during my care of the patient were reviewed by me and considered in my medical decision making (see chart for details).  Clinical Course  Value Comment By Time  Calcium: 9.5 (Reviewed) Kalman Drape, PA 08/05 2153   Patient combative at time of interview and physical exam. Patient was given 20 of Geodon and ativan. The mother was able to convince the patient to allow her blood to be drawn and her vital signs be given. Patient initially would not allow me to do a physical exam. She did later allow me to listen to her heart and lungs.   Patient found to be hypokalemic with mild  hyponatremia and hyper chloremia. Will treat patient with PO potassium while in the ED.   Pending baseline EKG. Pt refused EKG. TTS consult placed.  Pt is medically cleared for psych evaluation and treatment.  Final Clinical Impressions(s) / ED Diagnoses   Final diagnoses:  Schizoaffective disorder, unspecified type [facility])    New Prescriptions New Prescriptions   No medications on file     Kalman Drape, [place] 06/19/16 2156    Leo Grosser, MD 06/20/16 [phone]

## 2016-06-19 NOTE — ED Notes (Signed)
Pt admitted to room #42, limited assessment, pt reports she is tired, irritable. Pt denies SI/HI. Endorsing AVH. "They stress me out." Pt reports "My sister done some shady mess" Flight of ideas. Special checks q 15 mins in place for safety. Video monitoring in place.

## 2016-06-19 NOTE — ED Triage Notes (Signed)
Patient presents under IVC, paperwork states "Committed to [facility] 06/17/16, bi polar and schizophrenic, at  [employer] disturbing customers, told her mother she hear voices that someone is trying to kill her, states her apartment and car are evil, tried to fight her sister, told crisis counselor she would fuck her up, as she stated walking towards her, walking around experience delusions".

## 2016-06-19 NOTE — ED Triage Notes (Signed)
Patient wandering in triage, escorted back to room by [facility], patient yelling in room, refusing answer questions. Patient appears to be responding to internal stimuli. Patient refusing vital signs at this time.

## 2016-06-20 DIAGNOSIS — F259 Schizoaffective disorder, unspecified: Secondary | ICD-10-CM | POA: Diagnosis not present

## 2016-06-20 NOTE — Clinical Social Work Note (Signed)
CSW provided first examination paperwork per psychiatry request to uphold IVC status.  Forms signed and placed in patient's chart.  Dede Query, LCSW [place] Worker - Weekend Coverage cell #: [phone]

## 2016-06-20 NOTE — ED Notes (Signed)
Pt observed sitting on bed in assigned room talking to her self.

## 2016-06-20 NOTE — ED Notes (Signed)
Pt refusing lab. Dr. Loni Muse notified.

## 2016-06-20 NOTE — ED Notes (Signed)
Pt declined to take her metformin, sts that she "took it 2 hours ago."  Pt ate veggies on supper tray

## 2016-06-20 NOTE — ED Notes (Signed)
Pt irritable. Argumentative with this nurse at medication pass. Pt paranoid,religiously preoccupied. Pt praying and chanting over medication. Pt responding to internal stimuli. Pt denies SI/HI. Special checks q 15 mins in place. Video monitoring in place.

## 2016-06-20 NOTE — ED Notes (Addendum)
Pt refused all oral medications stating that she doesn't take that medications.  Pt also refused EKG

## 2016-06-20 NOTE — BH Assessment (Signed)
Patient unable to be assessed. MD, Kathrynn Humble aware, requests to alert MD when patient is able/willing to participate in assessment and oncoming EDP will put in a consult for assessment.   Rosalin Hawking, LCSW Therapeutic Triage Specialist [place] 06/20/2016 6:56 AM

## 2016-06-20 NOTE — Progress Notes (Signed)
Disposition CSW completed patient referrals to the following inpatient psych facilities:  [facility]  CSW will continue to follow patient for placement needs.  [facility] Disposition CSW [phone]

## 2016-06-20 NOTE — ED Notes (Signed)
This nurse called phlebotomist and notified of ordered BMP lab.

## 2016-06-20 NOTE — BH Assessment (Addendum)
Assessment Note  [name] is an 45 y.o. female with history of Schizoaffective Disorder and Depression. Patient sts, "I am only hear for stress and nothing more". Patient denies SI. However, previous notes indicate that patient has tried to harm herself when stressed. Patient denies self mutilating behaviors.  She denies depressive symptoms. She reports family stress but refused to provide any further details. Patient sts that she has not slept well in the past week. Appetite is poor. Patient denies family history of mental health illness. Denies HI. No legal issues reported. No history of violent or aggressive behaviors. She denies AVH's. Patient however appears very paranoid and guarded. She displays symptoms of delusional thoughts. She asked this Probation officer several times, "Is someone coming to get me". Patient afraid that someone will hurt her.  She believes that her neighbors are putting witch craft spells on her and trying to kill her. Sts, "My neighbors keep bothering me". Patient is hyper religous throughout the assessment speaking of "Jehovah God and need for worship".  She denies visual hallucinations. Patient stating that she may have been to [facility] in the past but not sure. She currently does not have a active psychiatrist or therapist. She reports previous hospitalizations at [facility] in the past. Denies alcohol and drug use.   Diagnosis: Schizoaffective Disorder and Depression   Past Medical History:  Past Medical History:  Diagnosis Date  . Anemia   . Congenital cerebral palsy ([place])   . Depression   . 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 Endometriosis   . Hearing impairment   . Heart murmur    at birth  . Hypertension   . Neuromuscular disorder ([place])    Cerebral palsy- very mild left side of brain  . Schizoaffective disorder [facility])     Past Surgical History:  Procedure Laterality Date  . CESAREAN SECTION    . EXPLORATORY LAPAROTOMY WITH ABDOMINAL MASS EXCISION     . MYOMECTOMY N/A 08/05/2015   Procedure: Abdominal Hysterectomy, Bilateral Salpingectomy;  Surgeon: Osborne Oman, MD;  Location: [facility];  Service: Gynecology;  Laterality: N/A;    Family History:  Family History  Problem Relation Age of Onset  . Heart failure Mother   . Hypertension Mother   . Diabetes Mellitus II Mother   . Cancer Mother   . Diabetes Mellitus II Father   . Hypertension Father     Social History:  reports that she has never smoked. She has never used smokeless tobacco. She reports that she does not drink alcohol or use drugs.  Additional Social History:  Alcohol / Drug Use Pain Medications: UTA Prescriptions: UTA Over the Counter: UTA Longest period of sobriety (when/how long): None   CIWA: CIWA-Ar BP: 104/62 Pulse Rate: 115 COWS:    Allergies:  Allergies  Allergen Reactions  . Bactrim [Sulfamethoxazole-Trimethoprim] Anaphylaxis, Shortness Of Breath and Swelling  . Ivp Dye [Iodinated Diagnostic Agents] Nausea And Vomiting  . Pollen Extract     Seasonal allergies  . Sulfa Antibiotics Swelling    Eyes and lips swelled up    Home Medications:  (Not in a hospital admission)  OB/GYN Status:  Patient's last menstrual period was 07/23/2015 (exact date).  General Assessment Data [facility] of [facility]: [facility] TTS Assessment: In system Is this a Tele or Face-to-Face Assessment?: Face-to-Face Is this an Initial Assessment or a Re-assessment for this encounter?: Re-Assessment Marital status: Other (comment) Maiden name:  (UTA) Is patient pregnant?: Unknown Pregnancy Status: Unknown Living Arrangements:  Other relatives, Other (Comment) ("I live with my family") Can pt return to current living arrangement?:  (UTA) Admission Status: Involuntary Is patient capable of signing voluntary admission?: No Referral Source: Other Insurance type:  English as a second language teacher)     Crisis Care Plan Living Arrangements: Other relatives, Other (Comment) ("I live with my  family") Legal Guardian:  (no legal guardian ) Name of Psychiatrist:  (no psychiatrist ) Name of Therapist:  (no therapist )  Education Status Is patient currently in school?: No Current Grade:  (n/a) Highest grade of school patient has completed:  (n/a) Name of school:  (n/a) Contact person:  (n/a)  Risk to self with the past 6 months Suicidal Ideation: No Has patient been a risk to self within the past 6 months prior to admission? : No Suicidal Intent: No Has patient had any suicidal intent within the past 6 months prior to admission? : No Is patient at risk for suicide?: No Suicidal Plan?: No Has patient had any suicidal plan within the past 6 months prior to admission? : No Access to Means: No What has been your use of drugs/alcohol within the last 12 months?:  (n/a) Previous Attempts/Gestures: No Substance abuse history and/or treatment for substance abuse?:  (uta)  Risk to Others within the past 6 months Homicidal Ideation: No Does patient have any lifetime risk of violence toward others beyond the six months prior to admission? : No Thoughts of Harm to Others: No Current Homicidal Intent: No Current Homicidal Plan: No Access to Homicidal Means: No Identified Victim:  (n/a) History of harm to others?: No Assessment of Violence: None Noted Violent Behavior Description:  (patient is calm and cooperative ) Does patient have access to weapons?: No Criminal Charges Pending?: No Does patient have a court date: No Is patient on probation?: No  Psychosis Hallucinations: None noted Delusions: None noted  Mental Status Report Appearance/Hygiene: In scrubs Eye Contact: Fair Motor Activity: Freedom of movement Speech: Argumentative, Pressured Level of Consciousness: Irritable, Restless Mood: Depressed, Anxious Affect: Blunted, Preoccupied Anxiety Level: Minimal Thought Processes: Circumstantial Judgement: Impaired Orientation: Unable to assess Obsessive Compulsive  Thoughts/Behaviors: Unable to Assess  Cognitive Functioning Concentration: Unable to Assess Memory: Unable to Assess IQ: Average Insight: Poor Impulse Control: Poor Appetite: Fair Weight Loss:  (none reported) Weight Gain:  (none reported) Sleep: Decreased Total Hours of Sleep:  (none reported) Vegetative Symptoms: Unable to Assess  ADLScreening [facility] Assessment Services) Patient's cognitive ability adequate to safely complete daily activities?: Yes Patient able to express need for assistance with ADLs?: Yes Independently performs ADLs?: Yes (appropriate for developmental age)  Prior Inpatient Therapy Prior Inpatient Therapy: No Prior Therapy Dates:  (discharged from [facility] 2 to 3 days ago) Prior Therapy Facilty/Provider(s):  [facility]) Reason for Treatment:  (Schizoaffective Disorder)  Prior Outpatient Therapy Prior Outpatient Therapy: Yes Prior Therapy Dates:  (current) Prior Therapy Facilty/Provider(s):  [facility] ) Reason for Treatment:  (Schizoaffective Disorder) Does patient have an ACCT team?: No Does patient have Intensive In-House Services?  : No Does patient have [facility] services? : Unknown Does patient have P4CC services?: No  ADL Screening (condition at time of admission) Patient's cognitive ability adequate to safely complete daily activities?: Yes Is the patient deaf or have difficulty hearing?: No Does the patient have difficulty seeing, even when wearing glasses/contacts?: No Does the patient have difficulty concentrating, remembering, or making decisions?: Yes Patient able to express need for assistance with ADLs?: Yes Does the patient have difficulty dressing or bathing?: No Independently performs ADLs?: Yes (  appropriate for developmental age) Communication: Independent Dressing (OT): Independent Grooming: Independent Feeding: Independent Bathing: Independent Toileting: Independent In/Out Bed: Independent Walks in Home: Independent Does the patient have  difficulty walking or climbing stairs?: No Weakness of Legs: None Weakness of Arms/Hands: None  Home Assistive Devices/Equipment Home Assistive Devices/Equipment: None    Abuse/Neglect Assessment (Assessment to be complete while patient is alone) Physical Abuse: Denies Verbal Abuse: Denies Sexual Abuse: Denies Exploitation of patient/patient's resources: Denies Self-Neglect: Denies Values / Beliefs Cultural Requests During Hospitalization: None Spiritual Requests During Hospitalization: None   Advance Directives (For Healthcare) Does patient have an advance directive?: No Would patient like information on creating an advanced directive?: No - patient declined information Nutrition Screen- [facility] Adult/[facility] Patient's home diet: Regular  Additional Information 1:1 In Past 12 Months?: Yes CIRT Risk: No Elopement Risk: No Does patient have medical clearance?: Yes     Disposition:  Disposition Initial Assessment Completed for this Encounter: Yes Disposition of Patient: Inpatient treatment program (Dr.  Darleene Cleaver and Delphia Grates, NP recoommend INPT treatment. ) Type of inpatient treatment program: Adult     (Dr.Akintayo and Shuvon, NP recommend INPT treatment. )  On Site Evaluation by:   Reviewed with Physician:    Evangeline Gula 06/20/2016 12:06 PM

## 2016-06-21 DIAGNOSIS — Z91010 Allergy to peanuts: Secondary | ICD-10-CM | POA: Diagnosis not present

## 2016-06-21 DIAGNOSIS — Z9114 Patient's other noncompliance with medication regimen: Secondary | ICD-10-CM | POA: Diagnosis not present

## 2016-06-21 DIAGNOSIS — Z046 Encounter for general psychiatric examination, requested by authority: Secondary | ICD-10-CM | POA: Diagnosis present

## 2016-06-21 DIAGNOSIS — I10 Essential (primary) hypertension: Secondary | ICD-10-CM | POA: Diagnosis present

## 2016-06-21 DIAGNOSIS — Z7984 Long term (current) use of oral hypoglycemic drugs: Secondary | ICD-10-CM | POA: Diagnosis not present

## 2016-06-21 DIAGNOSIS — F250 Schizoaffective disorder, bipolar type: Secondary | ICD-10-CM | POA: Diagnosis present

## 2016-06-21 DIAGNOSIS — D649 Anemia, unspecified: Secondary | ICD-10-CM | POA: Diagnosis present

## 2016-06-21 DIAGNOSIS — F259 Schizoaffective disorder, unspecified: Secondary | ICD-10-CM | POA: Diagnosis not present

## 2016-06-21 DIAGNOSIS — E119 Type 2 diabetes mellitus without complications: Secondary | ICD-10-CM | POA: Diagnosis present

## 2016-06-21 DIAGNOSIS — Z79899 Other long term (current) drug therapy: Secondary | ICD-10-CM | POA: Diagnosis not present

## 2016-06-21 DIAGNOSIS — G809 Cerebral palsy, unspecified: Secondary | ICD-10-CM | POA: Diagnosis present

## 2016-06-21 DIAGNOSIS — F209 Schizophrenia, unspecified: Secondary | ICD-10-CM | POA: Diagnosis not present

## 2016-06-21 NOTE — ED Notes (Signed)
Pt transported to [place] by [facility] Department. Pt left without difficulty.

## 2016-06-21 NOTE — BH Assessment (Signed)
Per Roderic Palau at [facility] pt has been accepted for admission to the services of Dr. Reece Levy. Report can be called to [phone]. PT can be transported after 9 am on 06/21/16.

## 2016-06-21 NOTE — ED Notes (Signed)
Affect flat, mood depressed, behavior guarded. Pt appears to be paranoid. She likes to have soda with her medications, and wants it to be poured into the cup in front of her. She uses a spoon to sift through the ice to make sure there is nothing in the ice. She takes her medications selectively: She refused Neurontin because it has made her feel bad in the past, and she refused HCTZ because she does not feel that she needs it. She asked for three towels and then gave them back because the shower did not meet her standard. She later asked for three towels again, but still did not take a shower.

## 2016-06-21 NOTE — ED Notes (Signed)
Pt refused blood draw, will report to oncoming RN.

## 2016-07-21 IMAGING — CR DG ANKLE COMPLETE 3+V*L*
3 series · 3 of 3 positions shown · non-contrast
Comparison: None.

CLINICAL DATA: Syncope.  Fall with left foot and ankle pain.

EXAM:
LEFT ANKLE COMPLETE - 3+ VIEW

[AP]
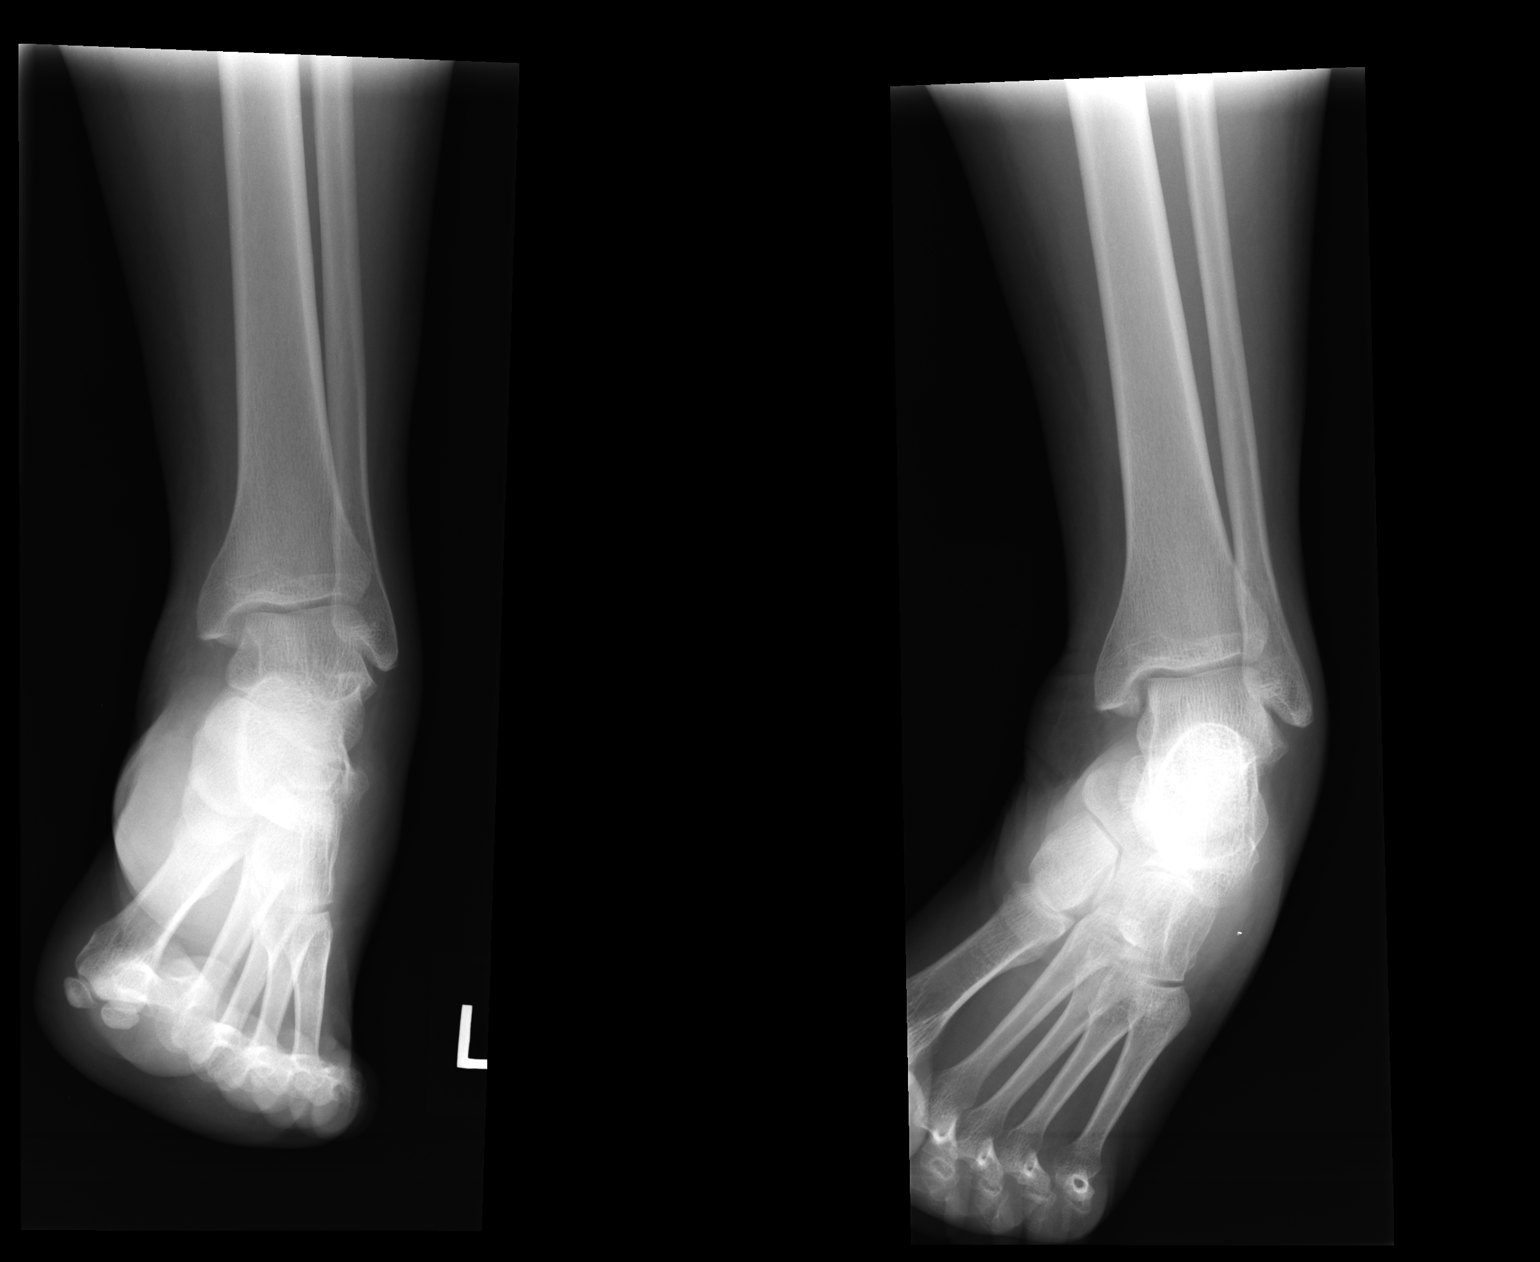

[ap obl int rot]
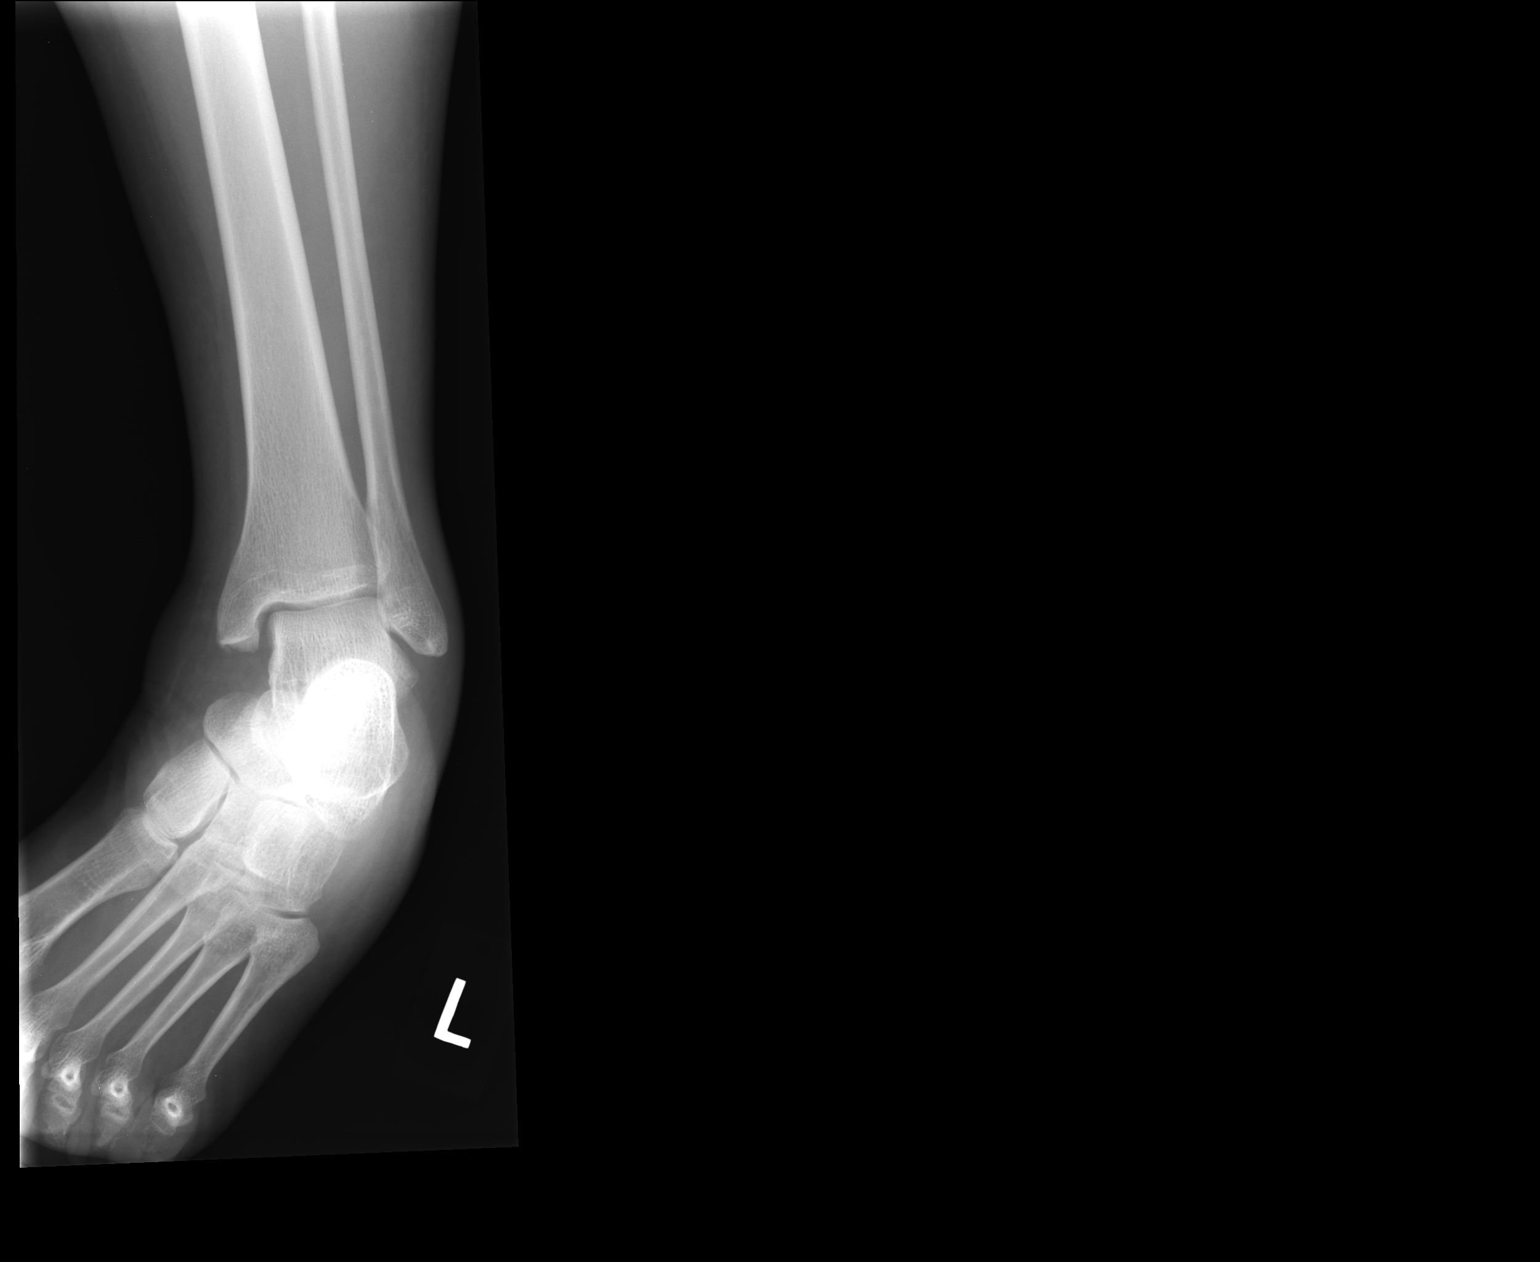

[ap obl ext rot]
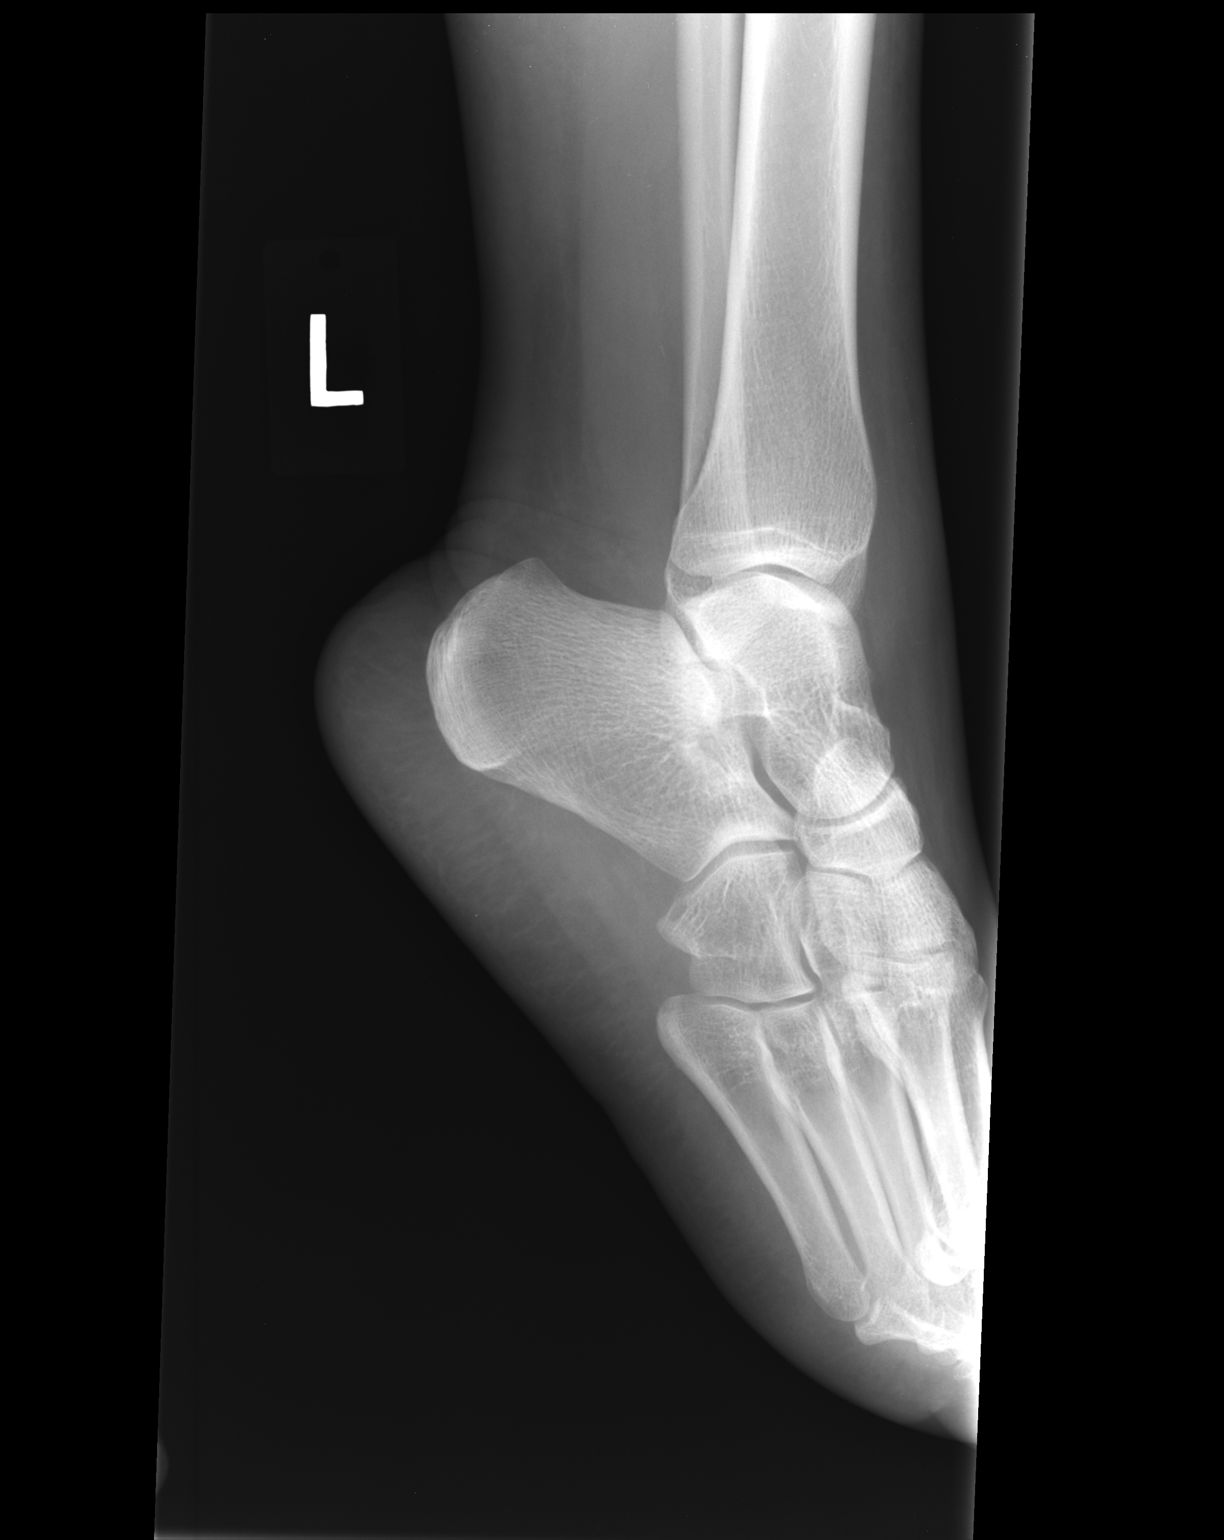

[3 of 3 positions shown; findings below may reference images not displayed]

FINDINGS: There is no evidence of fracture, dislocation, or joint effusion.
There is no evidence of arthropathy or other focal bone abnormality.
Soft tissues are unremarkable.
IMPRESSION: Negative.

## 2016-07-22 ENCOUNTER — Encounter (HOSPITAL_COMMUNITY): Payer: Self-pay | Admitting: *Deleted

## 2016-07-22 ENCOUNTER — Emergency Department (HOSPITAL_COMMUNITY)
Admission: EM | Admit: 2016-07-22 | Discharge: 2016-07-22 | Disposition: A | Discharge status: Other Acute Inpatient Hospital | Payer: Medicare Other | Attending: Emergency Medicine | Admitting: Emergency Medicine

## 2016-07-22 DIAGNOSIS — F259 Schizoaffective disorder, unspecified: Secondary | ICD-10-CM | POA: Diagnosis not present

## 2016-07-22 DIAGNOSIS — Z79899 Other long term (current) drug therapy: Secondary | ICD-10-CM | POA: Insufficient documentation

## 2016-07-22 DIAGNOSIS — Z91010 Allergy to peanuts: Secondary | ICD-10-CM | POA: Diagnosis not present

## 2016-07-22 DIAGNOSIS — Z7984 Long term (current) use of oral hypoglycemic drugs: Secondary | ICD-10-CM | POA: Insufficient documentation

## 2016-07-22 DIAGNOSIS — I10 Essential (primary) hypertension: Secondary | ICD-10-CM | POA: Diagnosis not present

## 2016-07-22 DIAGNOSIS — E119 Type 2 diabetes mellitus without complications: Secondary | ICD-10-CM | POA: Diagnosis not present

## 2016-07-22 LAB — CBC
HEMATOCRIT: 36 % (ref 36.0–46.0)
HEMOGLOBIN: 12.2 g/dL (ref 12.0–15.0)
MCH: 28.7 pg (ref 26.0–34.0)
MCHC: 33.9 g/dL (ref 30.0–36.0)
MCV: 84.7 fL (ref 78.0–100.0)
Platelets: 218 10*3/uL (ref 150–400)
RBC: 4.25 MIL/uL (ref 3.87–5.11)
RDW: 13.3 % (ref 11.5–15.5)
WBC: 6.3 10*3/uL (ref 4.0–10.5)

## 2016-07-22 LAB — COMPREHENSIVE METABOLIC PANEL
ALBUMIN: 3.4 g/dL — AB (ref 3.5–5.0)
ALT: 13 U/L — ABNORMAL LOW (ref 14–54)
ANION GAP: 9 (ref 5–15)
AST: 16 U/L (ref 15–41)
Alkaline Phosphatase: 68 U/L (ref 38–126)
BUN: 11 mg/dL (ref 6–20)
CHLORIDE: 101 mmol/L (ref 101–111)
CO2: 27 mmol/L (ref 22–32)
Calcium: 9.1 mg/dL (ref 8.9–10.3)
Creatinine, Ser: 0.67 mg/dL (ref 0.44–1.00)
GFR calc non Af Amer: >60 mL/min (ref >60)
GLUCOSE: 263 mg/dL — AB (ref 65–99)
POTASSIUM: 4 mmol/L (ref 3.5–5.1)
SODIUM: 137 mmol/L (ref 135–145)
Total Bilirubin: <0.1 mg/dL — ABNORMAL LOW (ref 0.3–1.2)
Total Protein: 6.4 g/dL — ABNORMAL LOW (ref 6.5–8.1)

## 2016-07-22 LAB — SALICYLATE LEVEL

## 2016-07-22 LAB — PREGNANCY, URINE: Preg Test, Ur: NEGATIVE

## 2016-07-22 LAB — RAPID URINE DRUG SCREEN, HOSP PERFORMED
AMPHETAMINES: NOT DETECTED
BARBITURATES: NOT DETECTED
BENZODIAZEPINES: NOT DETECTED
COCAINE: NOT DETECTED
OPIATES: NOT DETECTED
TETRAHYDROCANNABINOL: NOT DETECTED

## 2016-07-22 LAB — ETHANOL: Alcohol, Ethyl (B): <5 mg/dL (ref <5)

## 2016-07-22 LAB — ACETAMINOPHEN LEVEL

## 2016-07-22 MED ORDER — LORAZEPAM 1 MG PO TABS: 1.0000 mg | ORAL_TABLET | Freq: Three times a day (TID) | ORAL | Status: DC | PRN

## 2016-07-22 MED ORDER — METFORMIN HCL 500 MG PO TABS
500.0000 mg | ORAL_TABLET | Freq: Two times a day (BID) | ORAL | Status: DC
  Administered 2016-07-22: 500 mg via ORAL
  Filled 2016-07-22: qty 1

## 2016-07-22 MED ORDER — LISINOPRIL 10 MG PO TABS: 10.0000 mg | ORAL_TABLET | Freq: Every day | ORAL | Status: DC

## 2016-07-22 MED ORDER — IBUPROFEN 200 MG PO TABS: 600.0000 mg | ORAL_TABLET | Freq: Three times a day (TID) | ORAL | Status: DC | PRN

## 2016-07-22 MED ORDER — ACETAMINOPHEN 325 MG PO TABS: 650.0000 mg | ORAL_TABLET | ORAL | Status: DC | PRN

## 2016-07-22 MED ORDER — METOPROLOL SUCCINATE ER 25 MG PO TB24
25.0000 mg | ORAL_TABLET | Freq: Every day | ORAL | Status: DC
  Filled 2016-07-22: qty 1

## 2016-07-22 MED ORDER — HYDROCHLOROTHIAZIDE 12.5 MG PO CAPS: 12.5000 mg | ORAL_CAPSULE | Freq: Every day | ORAL | Status: DC

## 2016-07-22 MED ORDER — LISINOPRIL-HYDROCHLOROTHIAZIDE 10-12.5 MG PO TABS: 1.0000 | ORAL_TABLET | Freq: Every day | ORAL | Status: DC

## 2016-07-22 NOTE — Discharge Instructions (Signed)
Go to [facility]

## 2016-07-22 NOTE — BH Assessment (Signed)
Discharge back to [facility] for placement and psychiatric treatment as needed. The charge nurse at [facility]) is aware that patient will be returning. Nurse report [phone]. Patient medically cleared, per EDP Dr. Alvino Chapel. [facility] Sessions has a copy of patient's labs.

## 2016-07-22 NOTE — ED Notes (Signed)
Pt is aware that she will be going top [facility].

## 2016-07-22 NOTE — BH Assessment (Addendum)
Assessment Note  [name] is an 45 y.o. female with history of Schizophrenia.Patient presents to [facility] from [facility]. Per [facility] Sessions charge nurse Delfino Lovett) patient needs to be free of agitation and medically cleared and will be accepted back to [facility] for dispositioning. Writer verified with EDP-Dr. Alvino Chapel that patient was medically cleared. Writer met with patient to complete a TTS assessment and also assess agitation level. Patient IVC'd by mother. "Reports pt was discharged from [facility] d/t pt not taking her meds.  Pt was taken to her Mother's home, walked to [facility] and was picked up there by [employer].  Pt is upset, states that her sister forged her mother's signature and "did this"."  Patient denies SI, HI, and AVH's. Denies alcohol and drug use. Patient was calm, cooperative, and polite throughout the assessment.    Diagnosis: Schizoaffective Disorder  Past Medical History:  Past Medical History:  Diagnosis Date  . Anemia   . Congenital cerebral palsy ([place])   . Depression   . 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 Endometriosis   . Hearing impairment   . Heart murmur    at birth  . Hypertension   . Neuromuscular disorder ([place])    Cerebral palsy- very mild left side of brain  . Schizoaffective disorder [facility])     Past Surgical History:  Procedure Laterality Date  . CESAREAN SECTION    . EXPLORATORY LAPAROTOMY WITH ABDOMINAL MASS EXCISION    . MYOMECTOMY N/A 08/05/2015   Procedure: Abdominal Hysterectomy, Bilateral Salpingectomy;  Surgeon: Osborne Oman, MD;  Location: [facility];  Service: Gynecology;  Laterality: N/A;    Family History:  Family History  Problem Relation Age of Onset  . Heart failure Mother   . Hypertension Mother   . Diabetes Mellitus II Mother   . Cancer Mother   . Diabetes Mellitus II Father   . Hypertension Father     Social History:  reports that she has never smoked. She has never used smokeless tobacco. She  reports that she does not drink alcohol or use drugs.  Additional Social History:     CIWA: CIWA-Ar BP: 119/73 Pulse Rate: 96 COWS:    Allergies:  Allergies  Allergen Reactions  . Bactrim [Sulfamethoxazole-Trimethoprim] Anaphylaxis, Shortness Of Breath and Swelling  . Ivp Dye [Iodinated Diagnostic Agents] Nausea And Vomiting  . Peanut-Containing Drug Products     rash  . Pollen Extract     Seasonal allergies  . Sulfa Antibiotics Swelling    Eyes and lips swelled up    Home Medications:  (Not in a hospital admission)  OB/GYN Status:  Patient's last menstrual period was 07/23/2015 (exact date).  General Assessment Data Admission Status: Involuntary    Home Medications:  (Not in a hospital admission)  OB/GYN Status:  Patient's last menstrual period was 07/23/2015 (exact date).  General Assessment Data [facility] TTS Assessment: In system Is this a Tele or Face-to-Face Assessment?: Face-to-Face Is this an Initial Assessment or a Re-assessment for this encounter?: Initial Assessment Marital status: Other (comment) Maiden name:  (unknown ) Is patient pregnant?: Unknown Pregnancy Status: Unknown Living Arrangements: Other relatives, Other (Comment) ("I live with my family") Can pt return to current living arrangement?:  (UTA) Admission Status: Involuntary Is patient capable of signing voluntary admission?: No Referral Source: Other Insurance type:  Passenger transport manager )     Crisis Care Plan Living Arrangements: Other relatives, Other (Comment) ("I live with my family") Legal Guardian:  Other: (no legal guardian ) Name of Psychiatrist:  (no psychiatrist ) Name of Therapist:  (no therapist )  Education Status Is patient currently in school?: No Current Grade:  (n/a) Highest grade of school patient has completed:  (n/a) Name of school:  (n/a) Contact person:  (n/a)  Risk to self with the past 6 months Suicidal Ideation: No Has patient been a risk to  self within the past 6 months prior to admission? : No Suicidal Intent: No Has patient had any suicidal intent within the past 6 months prior to admission? : No Is patient at risk for suicide?: No Suicidal Plan?: No Access to Means: No What has been your use of drugs/alcohol within the last 12 months?:  (patient denies; UDS negative ) Previous Attempts/Gestures:  (patient denies; IVC reports mulltiple prior attempts) How many times?:  (patient denies; IVC reports multiple prior attempts) Other Self Harm Risks:  (none reported) Triggers for Past Attempts:  (UTA) Intentional Self Injurious Behavior: None Family Suicide History:  (none reported) Recent stressful life event(s): Other (Comment) (patient reports family conflict ) Persecutory voices/beliefs?: No Depression: Yes Depression Symptoms: Loss of interest in usual pleasures, Fatigue, Despondent, Feeling angry/irritable Substance abuse history and/or treatment for substance abuse?: No Suicide prevention information given to non-admitted patients: Not applicable  Risk to Others within the past 6 months Homicidal Ideation: No Does patient have any lifetime risk of violence toward others beyond the six months prior to admission? : No Thoughts of Harm to Others: No Current Homicidal Intent: No Current Homicidal Plan: No Access to Homicidal Means: No Identified Victim:  (n/a) History of harm to others?: No Assessment of Violence: None Noted Violent Behavior Description:  (patient is calm and cooperative ) Does patient have access to weapons?: No Criminal Charges Pending?: No Does patient have a court date: No Is patient on probation?: No  Psychosis Hallucinations: None noted (Pt denies; IVC sts that patient is hearing voices) Delusions: None noted  Mental Status Report Appearance/Hygiene: In scrubs Eye Contact: Good Motor Activity: Freedom of movement Speech: Argumentative, Pressured Level of Consciousness: Irritable,  Restless Mood: Depressed Affect: Blunted, Preoccupied Anxiety Level: Minimal Thought Processes: Circumstantial Judgement: Impaired Orientation: Unable to assess Obsessive Compulsive Thoughts/Behaviors: Unable to Assess  Cognitive Functioning Concentration: Decreased Memory: Recent Intact, Remote Intact IQ: Average Insight: Fair Impulse Control: Fair Appetite: Fair Weight Loss:  (none reported) Weight Gain:  (none reported) Sleep: Decreased Total Hours of Sleep:  (varies ) Vegetative Symptoms: Unable to Assess  ADLScreening [facility] Assessment Services) Patient's cognitive ability adequate to safely complete daily activities?: Yes Patient able to express need for assistance with ADLs?: Yes Independently performs ADLs?: Yes (appropriate for developmental age)  Prior Inpatient Therapy Prior Inpatient Therapy: No Prior Therapy Dates:  (discharged from [place] 2 to 3 days ago) Prior Therapy Facilty/Provider(s):  [facility]) Reason for Treatment:  (Schizoaffective Disorder)  Prior Outpatient Therapy Prior Outpatient Therapy: Yes Prior Therapy Dates:  (current) Prior Therapy Facilty/Provider(s):  [facility] ) Reason for Treatment:  (Schizoaffective Disorder) Does patient have an ACCT team?: No Does patient have Intensive In-House Services?  : No Does patient have [facility] services? : Yes Does patient have P4CC services?: No  ADL Screening (condition at time of admission) Patient's cognitive ability adequate to safely complete daily activities?: Yes Patient able to express need for assistance with ADLs?: Yes Independently performs ADLs?: Yes (appropriate for developmental age)             Advance Directives (For Healthcare) Does patient have  an advance directive?: No    Additional Information 1:1 In Past 12 Months?: Yes CIRT Risk: No Elopement Risk: No Does patient have medical clearance?: Yes     Disposition:  Disposition Initial Assessment Completed for this Encounter:  Yes (Patient to return back to [facility] for dispositioning ) Disposition of Patient:  (Return back to [place] ) Other disposition(s): Other (Comment) (Patient medically cleared per Dr. Alvino Chapel)  On Site Evaluation by:   Reviewed with Physician:      Waldon Merl [facility] 07/22/2016 5:28 PM

## 2016-07-22 NOTE — ED Notes (Signed)
Up to the bathroom 

## 2016-07-22 NOTE — ED Triage Notes (Signed)
Pt transported from [place] by [employer] with IVC papers taken out by her Mother.  Reports pt was discharged from [facility] d/t pt not taking her meds.  Pt was taken to her Mother's home, walked to [facility] and was picked up there by [employer].  Pt is upset, states that her sister forged her mother's signature and "did this."

## 2016-07-22 NOTE — ED Notes (Signed)
TTS into talk w/ pt and pt's mother.

## 2016-07-22 NOTE — ED Notes (Signed)
Gave report to [place], Therapist, sports, [facility]. Facility is ready to accept the patient.

## 2016-07-22 NOTE — ED Provider Notes (Addendum)
[facility] DEPT Provider Note   CSN: [id number] Arrival date & time: 07/22/16  1445     History   Chief Complaint Chief Complaint  Patient presents with  . IVC    HPI [name] is a 45 y.o. female.  The history is provided by the patient.  Patient brought in by [facility] under IVC. Reportedly discharged from [facility] 4 hours ago. Patient's mother filled out IVC paperwork stating that she is not taking her medicines. Patient states that she had been an [facility] [place] for a month and a half. Reportedly was discharged because she was doing well. There is another report from family that she was discharged because they could not handle her level of care there and she needed a higher level of care. Patient states that her sister has her own issues and forged her mother's paperwork. History of schizoaffective disorder.  Past Medical History:  Diagnosis Date  . Anemia   . Congenital cerebral palsy ([place])   . Depression   . Diabetes mellitus due to abnormal insulin ([place])   . Endometriosis   . Hearing impairment   . Heart murmur    at birth  . Hypertension   . Neuromuscular disorder ([place])    Cerebral palsy- very mild left side of brain  . Schizoaffective disorder [facility])     Patient Active Problem List   Diagnosis Date Noted  . Schizoaffective disorder, chronic condition ([place]) 06/18/2016  . Sinus headache 01/20/2016  . Anemia due to chronic blood loss   . Hyperlipidemia LDL goal <70 09/10/2014  . Thyroid nodule 07/30/2014  . Morbid obesity ([place]) 07/30/2014  . Depression, controlled 07/30/2014  . Preventative health care 07/30/2014  . Essential hypertension 11/07/2012  . Type 2 diabetes mellitus ([place]) 11/07/2012  . LEARNING DISABILITY 09/26/2007  . FIBROCYSTIC BREAST DISEASE 09/26/2007  . ENDOMETRIOSIS 09/26/2007    Past Surgical History:  Procedure Laterality Date  . CESAREAN SECTION    . EXPLORATORY LAPAROTOMY WITH ABDOMINAL MASS EXCISION    . MYOMECTOMY N/A  08/05/2015   Procedure: Abdominal Hysterectomy, Bilateral Salpingectomy;  Surgeon: Osborne Oman, MD;  Location: [facility];  Service: Gynecology;  Laterality: N/A;    OB History    Gravida Para Term Preterm AB Living   1 1 1     1    SAB TAB Ectopic Multiple Live Births                   Home Medications    Prior to Admission medications   Medication Sig Start Date End Date Taking? Authorizing Provider  benztropine (COGENTIN) 1 MG tablet Take 1 mg by mouth 2 (two) times daily.   Yes Historical Provider, MD  divalproex (DEPAKOTE ER) 500 MG 24 hr tablet Take 500 mg by mouth 2 (two) times daily.   Yes Historical Provider, MD  doxepin (SINEQUAN) 50 MG capsule Take 50 mg by mouth at bedtime.   Yes Historical Provider, MD  gabapentin (NEURONTIN) 300 MG capsule Take 1 capsule (300 mg total) by mouth 2 (two) times daily. 06/18/16  Yes Patrecia Pour, NP  lisinopril-hydrochlorothiazide (PRINZIDE,ZESTORETIC) 10-12.5 MG tablet TAKE ONE (1) TABLET BY MOUTH EVERY DAY 05/24/16  Yes Carly Montey Hora, MD  loratadine (CLARITIN) 10 MG tablet Take 10 mg by mouth daily.   Yes Historical Provider, MD  metFORMIN (GLUCOPHAGE) 500 MG tablet TAKE ONE (1) TABLET BY MOUTH TWO (2) TIMES DAILY WITH A MEAL 03/16/16  Yes Carly Montey Hora, MD  paliperidone (INVEGA  SUSTENNA) 156 MG/ML SUSP injection Inject 156 mg into the muscle once.   Yes Historical Provider, MD  risperidone (RISPERDAL) 4 MG tablet Take 4 mg by mouth 2 (two) times daily.   Yes Historical Provider, MD  venlafaxine (EFFEXOR) 75 MG tablet Take 75 mg by mouth daily.   Yes Historical Provider, MD  cetirizine (ZYRTEC) 10 MG tablet Take 1 tablet (10 mg total) by mouth daily as needed for allergies. Patient not taking: Reported on 07/22/2016 01/20/16   Loleta Chance, MD  cyanocobalamin 1000 MCG tablet TAKE ONE (1) TABLET BY MOUTH EVERY DAY Patient not taking: Reported on 07/22/2016 09/16/15   Juliet Rude, MD  cyclobenzaprine (FLEXERIL) 10 MG tablet Take 1 tablet (10 mg total)  by mouth 3 (three) times daily as needed for muscle spasms. Patient not taking: Reported on 07/22/2016 09/19/15   Clayton Bibles, PA-C  ferrous sulfate 325 (65 FE) MG tablet TAKE ONE (1) TABLET BY MOUTH 3 TIMES DAILY WITH MEALS Patient not taking: Reported on 07/22/2016 11/04/15   Sindy Guadeloupe Rivet, MD  fluPHENAZine (PROLIXIN) 2.5 MG tablet Take 1 tablet (2.5 mg total) by mouth at bedtime. Patient not taking: Reported on 07/22/2016 06/18/16   Patrecia Pour, NP  fluticasone [facility]) 50 MCG/ACT nasal spray Place 1 spray into both nostrils daily. Patient not taking: Reported on 07/22/2016 01/20/16 01/19/17  Loleta Chance, MD  ibuprofen (ADVIL,MOTRIN) 800 MG tablet Take 1 tablet (800 mg total) by mouth every 8 (eight) hours as needed for mild pain or moderate pain. Patient not taking: Reported on 07/22/2016 09/19/15   Clayton Bibles, PA-C  metoprolol succinate (TOPROL-XL) 25 MG 24 hr tablet Take 1 tablet (25 mg total) by mouth daily. Patient not taking: Reported on 07/22/2016 08/18/15   Sindy Guadeloupe Rivet, MD  pravastatin (PRAVACHOL) 40 MG tablet Take 1 tablet (40 mg total) by mouth daily. Patient not taking: Reported on 07/22/2016 08/18/15   Juliet Rude, MD  promethazine (PHENERGAN) 12.5 MG tablet Take 1 tablet (12.5 mg total) by mouth every 6 (six) hours as needed for nausea or vomiting. Patient not taking: Reported on 07/22/2016 09/16/15   Sindy Guadeloupe Rivet, MD  venlafaxine XR (EFFEXOR-XR) 150 MG 24 hr capsule Take 1 capsule (150 mg total) by mouth daily. Patient not taking: Reported on 07/22/2016 06/18/16   Patrecia Pour, NP    Family History Family History  Problem Relation Age of Onset  . Heart failure Mother   . Hypertension Mother   . Diabetes Mellitus II Mother   . Cancer Mother   . Diabetes Mellitus II Father   . Hypertension Father     Social History Social History  Substance Use Topics  . Smoking status: Never Smoker  . Smokeless tobacco: Never Used  . Alcohol use No     Allergies   Bactrim  [sulfamethoxazole-trimethoprim]; Ivp dye [iodinated diagnostic agents]; Peanut-containing drug products; Pollen extract; and Sulfa antibiotics   Review of Systems Review of Systems  Constitutional: Negative for activity change and appetite change.  Eyes: Negative for pain.  Respiratory: Negative for chest tightness and shortness of breath.   Cardiovascular: Negative for chest pain and leg swelling.  Gastrointestinal: Negative for abdominal pain, diarrhea, nausea and vomiting.  Genitourinary: Negative for flank pain.  Musculoskeletal: Negative for back pain and neck stiffness.  Skin: Negative for rash.  Neurological: Negative for weakness, numbness and headaches.  Psychiatric/Behavioral: Negative for behavioral problems, confusion, hallucinations and suicidal ideas.     Physical Exam Updated Vital  Signs BP 119/73 (BP Location: Left Arm)   Pulse 96   Temp 97.6 F (36.4 C) (Oral)   Resp 16   LMP 07/23/2015 (Exact Date)   SpO2 100%   Physical Exam  Constitutional: She appears well-developed.  HENT:  Head: Atraumatic.  Eyes: Pupils are equal, round, and reactive to light.  Neck: Neck supple.  Cardiovascular: Normal rate.   Pulmonary/Chest: Effort normal.  Abdominal: Soft.  Musculoskeletal: She exhibits no edema.  Neurological: She is alert.  Skin: Skin is warm. Capillary refill takes less than 2 seconds.  Psychiatric:  Patient has somewhat strange affect.     ED Treatments / Results  Labs (all labs ordered are listed, but only abnormal results are displayed) Labs Reviewed  COMPREHENSIVE METABOLIC PANEL - Abnormal; Notable for the following:       Result Value   Glucose, Bld 263 (*)    Total Protein 6.4 (*)    Albumin 3.4 (*)    ALT 13 (*)    Total Bilirubin <0.1 (*)    All other components within normal limits  ACETAMINOPHEN LEVEL - Abnormal; Notable for the following:    Acetaminophen (Tylenol), Serum <10 (*)    All other components within normal limits    ETHANOL  SALICYLATE LEVEL  CBC  URINE RAPID DRUG SCREEN, HOSP PERFORMED  PREGNANCY, URINE    EKG  EKG Interpretation None       Radiology No results found.  Procedures Procedures (including critical care time)  Medications Ordered in ED Medications  LORazepam (ATIVAN) tablet 1 mg (not administered)  acetaminophen (TYLENOL) tablet 650 mg (not administered)  ibuprofen (ADVIL,MOTRIN) tablet 600 mg (not administered)     Initial Impression / Assessment and Plan / ED Course  I have reviewed the triage vital signs and the nursing notes.  Pertinent labs & imaging results that were available during my care of the patient were reviewed by me and considered in my medical decision making (see chart for details).  Clinical Course    Patient brought in under IVC. Discharged earlier today from [facility]. Labs reassuring and at her baseline. Medically cleared. To be seen by TTS.  Final Clinical Impressions(s) / ED Diagnoses   Final diagnoses:  None    New Prescriptions New Prescriptions   No medications on file     Davonna Belling, MD 07/22/16 1747   Discussed with TTS. Reportedly was sent in from [facility] only for labs for clearance. We were not informed of this. Has been accepted there and will be discharged. Still IVC'd   Davonna Belling, MD 07/22/16 1853   [facility] requested to be a transfer. Accepted by Dr.Backkum.   Davonna Belling, MD 07/22/16 2005

## 2016-07-22 NOTE — ED Notes (Signed)
Pt ambulatory w/o difficulty to room 34 

## 2016-07-22 NOTE — ED Notes (Signed)
Contacted [facility], to transport patient to [facility]. [facility] noted [facility] will pick up patient.

## 2016-07-22 NOTE — ED Notes (Signed)
TTS into see 

## 2016-07-22 NOTE — ED Notes (Signed)
Bed: WTR8 Expected date:  Expected time:  Means of arrival:  Comments: 

## 2016-07-26 ENCOUNTER — Telehealth: Payer: Self-pay | Admitting: *Deleted

## 2016-07-26 NOTE — Telephone Encounter (Signed)
Received call from pt's mother stating patient is currently being held in [facility] ph# [phone] awaiting a bed at [facility].  Pt's bp and glucose elevated, mother feel as though her daughter may not be getting her bp/dm medications. She has asked that I contact the crisis center and speak with pt's counselor "Steele Berg or Vira Agar" to be sure they had a current list of patient medications. contacted crisis center, but pt's counselor was not in, and charge nurse was not able to give any info pertaining to patient.  She took my contact number and will let the counselor know that I have called.    Of note, pt's mother is in the process of becoming pt's healthcare poa, and will be completing paperwork on the 14th with the lawyer. She also informed me that prior to admission, pt had become delusional and violent to the point she was no longer able to care for her self and was a threat to herself and others.[name], Sandy Troeger Cassady9/11/20179:08 AM

## 2016-07-28 DIAGNOSIS — F319 Bipolar disorder, unspecified: Secondary | ICD-10-CM | POA: Diagnosis not present

## 2016-07-30 ENCOUNTER — Other Ambulatory Visit: Payer: Self-pay | Admitting: Internal Medicine

## 2016-07-30 DIAGNOSIS — Z1231 Encounter for screening mammogram for malignant neoplasm of breast: Secondary | ICD-10-CM

## 2016-08-02 ENCOUNTER — Ambulatory Visit (INDEPENDENT_AMBULATORY_CARE_PROVIDER_SITE_OTHER): Payer: Medicare Other | Admitting: Internal Medicine

## 2016-08-02 VITALS — BP 113/70 | HR 79 | Temp 98.3°F | Ht 64.0 in | Wt 217.9 lb

## 2016-08-02 DIAGNOSIS — Z79899 Other long term (current) drug therapy: Secondary | ICD-10-CM

## 2016-08-02 DIAGNOSIS — Z7984 Long term (current) use of oral hypoglycemic drugs: Secondary | ICD-10-CM

## 2016-08-02 DIAGNOSIS — M25511 Pain in right shoulder: Secondary | ICD-10-CM | POA: Diagnosis not present

## 2016-08-02 DIAGNOSIS — E1165 Type 2 diabetes mellitus with hyperglycemia: Secondary | ICD-10-CM | POA: Diagnosis present

## 2016-08-02 DIAGNOSIS — Z6837 Body mass index (BMI) 37.0-37.9, adult: Secondary | ICD-10-CM

## 2016-08-02 DIAGNOSIS — E1121 Type 2 diabetes mellitus with diabetic nephropathy: Secondary | ICD-10-CM

## 2016-08-02 DIAGNOSIS — E785 Hyperlipidemia, unspecified: Secondary | ICD-10-CM

## 2016-08-02 DIAGNOSIS — E0800 Diabetes mellitus due to underlying condition with hyperosmolarity without nonketotic hyperglycemic-hyperosmolar coma (NKHHC): Secondary | ICD-10-CM | POA: Diagnosis not present

## 2016-08-02 DIAGNOSIS — Z0000 Encounter for general adult medical examination without abnormal findings: Secondary | ICD-10-CM

## 2016-08-02 DIAGNOSIS — Z23 Encounter for immunization: Secondary | ICD-10-CM

## 2016-08-02 DIAGNOSIS — I10 Essential (primary) hypertension: Secondary | ICD-10-CM

## 2016-08-02 DIAGNOSIS — M25512 Pain in left shoulder: Secondary | ICD-10-CM

## 2016-08-02 LAB — POCT GLYCOSYLATED HEMOGLOBIN (HGB A1C): Hemoglobin A1C: 8.6

## 2016-08-02 LAB — GLUCOSE, CAPILLARY: Glucose-Capillary: 168 mg/dL — ABNORMAL HIGH (ref 65–99)

## 2016-08-02 MED ORDER — METFORMIN HCL 1000 MG PO TABS: 1000.0000 mg | ORAL_TABLET | Freq: Two times a day (BID) | ORAL | 3 refills | Status: DC

## 2016-08-02 MED ORDER — BLOOD GLUCOSE MONITOR KIT: PACK | 0 refills | Status: DC

## 2016-08-02 MED ORDER — GABAPENTIN 300 MG PO CAPS: 300.0000 mg | ORAL_CAPSULE | Freq: Two times a day (BID) | ORAL | 3 refills | Status: DC

## 2016-08-02 MED ORDER — LISINOPRIL-HYDROCHLOROTHIAZIDE 10-12.5 MG PO TABS: ORAL_TABLET | ORAL | 3 refills | Status: DC

## 2016-08-02 MED ORDER — CYCLOBENZAPRINE HCL 5 MG PO TABS: 5.0000 mg | ORAL_TABLET | Freq: Three times a day (TID) | ORAL | Status: DC | PRN

## 2016-08-02 MED ORDER — ATORVASTATIN CALCIUM 40 MG PO TABS: 40.0000 mg | ORAL_TABLET | Freq: Every day | ORAL | 11 refills | Status: DC

## 2016-08-02 MED ORDER — METOPROLOL SUCCINATE ER 25 MG PO TB24: 25.0000 mg | ORAL_TABLET | Freq: Every day | ORAL | 3 refills | Status: DC

## 2016-08-02 NOTE — Assessment & Plan Note (Signed)
She was  complaining of pain in her right shoulder and cervical region since her [facility]. It is more like a constant dull ache, nonradiating, aggravated with movements around shoulder. She denies any swelling, erythema, fever. She she was not aware of any precipitating factors. She denies any focal weakness or change in sensation. On exam.There was mild tenderness around her shoulder girdle and on right paracervical region. There is mild restriction of range of motion due to pain. Drop arm sign was negative. Assessment. Looks more muscular in nature.  Plan. Ibuprofen 800 mg every 8 hourly as needed. Flexeril 5 mg at bedtime. Try heating pad.

## 2016-08-02 NOTE — Assessment & Plan Note (Signed)
BP Readings from Last 3 Encounters:  08/02/16 113/70  07/22/16 137/77  06/21/16 127/87   She has well-controlled hypertension.  Continue with the current management of lisinopril-hydrochlorothiazide and metoprolol.

## 2016-08-02 NOTE — Assessment & Plan Note (Signed)
We provided her flu shot today.

## 2016-08-02 NOTE — Patient Instructions (Signed)
It was pleasure taking care of you today. We will be checking your cholesterol today. I'm increasing yet metformin 1000 mg twice daily. I'm starting you on Lipitor 40 mg for your cholesterol. You will be getting flu shot today. Tried to eat healthy and exercise regularly. As we talked consider adding Victoza next time if your blood sugar remain uncontrolled. For your shoulder pain, you can try warm compresses, muscle relaxant at nighttime and can use ibuprofen 800 mg 8 hourly if needed for pain. Please follow up with [place] in about a month for your blood sugar.

## 2016-08-02 NOTE — Assessment & Plan Note (Signed)
Her current A1c was 8.6 and CBG was 168. She is in contact uncontrolled diabetes. Her goal A1c is 7.  She is on multiple antipsychotic medicines which might be playing a role in her uncontrolled diabetes. We increased her metformin to 1000 mg twice a day. She needs to check her blood sugar regularly and bring glucometer meter during her next visit. She was provided a prescription for her own glucometer. She should work on her weight and do some regular exercise. We offered her Victoza today, as that might help her lose weight. She states that she first wanted to try increase in metformin first and if it does not work she will consider during her next follow-up.

## 2016-08-02 NOTE — Assessment & Plan Note (Signed)
Lipid Panel     Component Value Date/Time   CHOL 165 07/30/2014 1133   TRIG 84 07/30/2014 1133   HDL 47 07/30/2014 1133   CHOLHDL 3.5 07/30/2014 1133   VLDL 17 07/30/2014 1133   LDLCALC 101 (H) 07/30/2014 1133  She was not on any lipid-lowering medicine. We checked her lipid profile today. Start her on Lipitor 40 mg daily, as she has risk factors of hypertension and diabetes.

## 2016-08-02 NOTE — Progress Notes (Signed)
   CC: For F/U of her hypertension and Diabetes  HPI:  [name] is a 45 y.o.with PMHx as listed below, came to the clinic today for follow-up of her hypertension and diabetes. She states that whenever she checked her blood sugar at home it stays between 193 -230. She never brought her glucometer meter today, her mother told me that she never had her own glucometer meter and she was using Her's to check her blood sugar.  She was accompanied by her mother, who is also her caregiver and on sitting most of the questions on her behalf. She was recently discharged from  [facility] due to exacerbation of her  Schizoaffective disorder. She states that she is doing well since her discharge. She was also complaining of pain in her right shoulder and cervical region since her [facility]. It is more like a constant dull ache, nonradiating, aggravated with movements around shoulder. She denies any swelling, erythema, fever. She denies any focal weakness or change in sensation. She she was not aware of any precipitating factors. She also complain of tingling numbness and some pain in her hands and feet. She states that Neurontin does help but doesn't completely alleviate her symptoms.    Past Medical History:  Diagnosis Date  . Anemia   . Congenital cerebral palsy ([place])   . Depression   . Diabetes mellitus due to abnormal insulin ([place])   . Endometriosis   . Hearing impairment   . Heart murmur    at birth  . Hypertension   . Neuromuscular disorder ([place])    Cerebral palsy- very mild left side of brain  . Schizoaffective disorder ([place])     Review of Systems:  AS per HPI  Physical Exam:  Vitals:   08/02/16 1034  BP: 113/70  Pulse: 79  Temp: 98.3 F (36.8 C)  TempSrc: Oral  SpO2: 100%  Weight: 217 lb 14.4 oz (98.8 kg)  Height: 5\' 4"  (1.626 m)   Vitals:   08/02/16 1034  BP: 113/70  Pulse: 79  Temp: 98.3 F (36.8 C)    TempSrc: Oral  SpO2: 100%  Weight: 217 lb 14.4 oz (98.8 kg)  Height: 5\' 4"  (1.626 m)   General: Vital signs reviewed.  Patient is well-developed and well-nourished, in no acute distress and cooperative with exam.  Head: Normocephalic and atraumatic. Eyes: EOMI, conjunctivae normal, no scleral icterus.  Neck: Supple, trachea midline, normal ROM, no JVD, masses, thyromegaly, or carotid bruit present.  Cardiovascular: RRR, S1 normal, S2 normal, no murmurs, gallops, or rubs. Pulmonary/Chest: Clear to auscultation bilaterally, no wheezes, rales, or rhonchi. Abdominal: Soft, non-tender, non-distended, BS +, no masses, organomegaly, or guarding present.  Musculoskeletal: There was mild tenderness around her shoulder girdle and on right paracervical region. There is mild restriction of range of motion due to pain. Drop arm sign was negative. No erythema, edema or change in temperature noted. Extremities: No lower extremity edema bilaterally,  pulses symmetric and intact bilaterally. No cyanosis or clubbing. Neurological: A&O x3, Strength is normal and symmetric bilaterally, cranial nerve II-XII are grossly intact, no focal motor deficit, sensory intact to light touch bilaterally.  Skin: Warm, dry and intact. No rashes or erythema. Psychiatric: Normal mood and affect. speech and behavior is normal. Cognition and memory are normal.   Assessment & Plan:   See Encounters Tab for problem based charting.  Patient seen with Dr. Angelia Mould

## 2016-08-02 NOTE — Assessment & Plan Note (Signed)
Her weight was 217 pounds today with BMI of 37.4.  We discuss about diet and regular exercise today. We offered her starting her on Victoza due to her uncontrolled diabetes, and explained that it will help her lose weight. She might consider Victoza during her next follow-up visit.

## 2016-08-03 LAB — LIPID PANEL
CHOL/HDL RATIO: 3.4 ratio (ref 0.0–4.4)
Cholesterol, Total: 188 mg/dL (ref 100–199)
HDL: 56 mg/dL (ref >39)
LDL CALC: 115 mg/dL — AB (ref 0–99)
TRIGLYCERIDES: 85 mg/dL (ref 0–149)
VLDL Cholesterol Cal: 17 mg/dL (ref 5–40)

## 2016-08-04 ENCOUNTER — Ambulatory Visit
Admission: RE | Admit: 2016-08-04 | Discharge: 2016-08-04 | Disposition: A | Discharge status: Home / Self Care | Payer: Medicare Other | Source: Ambulatory Visit | Attending: Internal Medicine | Admitting: Internal Medicine

## 2016-08-04 DIAGNOSIS — Z1231 Encounter for screening mammogram for malignant neoplasm of breast: Secondary | ICD-10-CM

## 2016-08-04 NOTE — Progress Notes (Signed)
Internal Medicine Clinic Attending  I saw and evaluated the patient.  I personally confirmed the key portions of the history and exam documented by Dr. Amin and I reviewed pertinent patient test results.  The assessment, diagnosis, and plan were formulated together and I agree with the documentation in the resident's note. 

## 2016-08-05 ENCOUNTER — Ambulatory Visit (HOSPITAL_COMMUNITY)
Admission: RE | Admit: 2016-08-05 | Discharge: 2016-08-05 | Disposition: A | Discharge status: Home / Self Care | Payer: Medicare Other | Source: Ambulatory Visit | Attending: Internal Medicine | Admitting: Internal Medicine

## 2016-08-05 DIAGNOSIS — M25512 Pain in left shoulder: Secondary | ICD-10-CM | POA: Insufficient documentation

## 2016-08-05 DIAGNOSIS — M47892 Other spondylosis, cervical region: Secondary | ICD-10-CM | POA: Diagnosis not present

## 2016-08-05 DIAGNOSIS — M542 Cervicalgia: Secondary | ICD-10-CM | POA: Diagnosis not present

## 2016-08-05 DIAGNOSIS — M47812 Spondylosis without myelopathy or radiculopathy, cervical region: Secondary | ICD-10-CM | POA: Diagnosis not present

## 2016-08-23 DIAGNOSIS — F319 Bipolar disorder, unspecified: Secondary | ICD-10-CM | POA: Diagnosis not present

## 2016-08-25 DIAGNOSIS — F319 Bipolar disorder, unspecified: Secondary | ICD-10-CM | POA: Diagnosis not present

## 2016-09-02 ENCOUNTER — Other Ambulatory Visit: Payer: Self-pay | Admitting: Dietician

## 2016-09-02 ENCOUNTER — Ambulatory Visit (INDEPENDENT_AMBULATORY_CARE_PROVIDER_SITE_OTHER): Payer: Medicare Other | Admitting: Dietician

## 2016-09-02 DIAGNOSIS — Z6838 Body mass index (BMI) 38.0-38.9, adult: Secondary | ICD-10-CM

## 2016-09-02 DIAGNOSIS — Z713 Dietary counseling and surveillance: Secondary | ICD-10-CM | POA: Diagnosis not present

## 2016-09-02 DIAGNOSIS — E1121 Type 2 diabetes mellitus with diabetic nephropathy: Secondary | ICD-10-CM

## 2016-09-02 DIAGNOSIS — E119 Type 2 diabetes mellitus without complications: Secondary | ICD-10-CM | POA: Diagnosis not present

## 2016-09-02 MED ORDER — GLUCOSE BLOOD VI STRP: ORAL_STRIP | 4 refills | Status: DC

## 2016-09-02 MED ORDER — ACCU-CHEK FASTCLIX LANCETS MISC: 4 refills | Status: DC

## 2016-09-02 NOTE — Patient Instructions (Signed)
Your goals for the next 4 weeks: 1- Walk at least 30 minues every day  2- Drink more water and no calories drinks instead of juice and soda  FYI+ Eat more... 1. Whole grains: whole wheat cereal, crackers, bread, pasta, old fashioned oats & [place] rice 2. Whole fruits-1 cup a day 3. Vegetables- 2-3 cups a day 4. Lowfat Protein: Chicken, [place], lean cuts of beef and pork, fish 2-3 times a week 5. Nuts, Seeds and Soy:add walnuts to cereal, peanut butter sandwich  Eat Less... 1. Saturated & Transfats- mostly from red meat, high fat dairy like milk, ice cream, coffee creamer, snack foods like chips, pork rind, fried foods 2. Added Sugar: artifical sweeteners can help 3. Sodium:Use spices instead of salt, avoid salty foods like soup, crackers, chips, lunch meat, sausage, hot dogs  Avoid.... . High-fructose corn syrup and "other sugars" . Partially hydrogenated vegetable oils . Trans fatty acids . Nondairy coffee creamer Of course, too much of almost any food.  See you November 16th!

## 2016-09-02 NOTE — Telephone Encounter (Signed)
Needs supplies for new meter provided in office today. Accu chek Guide

## 2016-09-02 NOTE — Progress Notes (Signed)
  Medical Nutrition Therapy:  Appt start time: [id number] end time:  [id number] Visit # 1  Assessment:  Primary concerns today: glucose and weight management.  Ms. Reddington attended group diabetes and weight management medical nutrition therapy class today with hr mother.  We discussed success, challenges, ways to stay motivated and how to increase fiber in our diet.  Her goal is to decrease her juice intake by drinking more water and non-nutritive sweet drinks and to walk 30 minutes a day Preferred Learning Style: No preference indicated  Learning Readiness: Contemplating  ANTHROPOMETRICS: weight-227#, BMI-38-39, IBW- 120-132: RBW- 150-160# WEIGHT HISTORY:230s# in 2012 then dropped to 192# intentionally in 2016 and now back up to 227 with trend increasing.  SLEEP:not assessed today MEDICATIONS: concern that psych medication are causing weight gain, consider GLP1 to assist with weight loss and blood sugar control BLOOD SUGAR:not done today DIETARY INTAKE: Patient reports high intake of sweets and candy, trying to stop drinking juice  Usual physical activity: walks 30+ minutes a day  Estimated daily energy needs:  1400-1600 calories 150-170 g carbohydrates   Progress Towards Goal(s):  In progress.   Nutritional Diagnosis:  NI-1.5 Excessive energy intake As related to excess sweets, starches and large food portions.  As evidenced by her report and increasing weight.    Intervention:  Nutrition education about goals, increasing fiber, how to handle challenges.  Coordination of care: New meter provided to patient today accu cek guide- request strips and lancets  Teaching Method Utilized: Visual, Auditory,Hands on Handouts given during visit include:Fiber handout, goal setting sheet Barriers to learning/adherence to lifestyle change:limited finances,  mental health issues, medicines and social situation Demonstrated degree of understanding via:  Teach Back   Monitoring/Evaluation:  Dietary intake,  exercise, meter, and body weight in 4 week(s).

## 2016-09-03 ENCOUNTER — Other Ambulatory Visit: Payer: Self-pay

## 2016-09-03 DIAGNOSIS — E1121 Type 2 diabetes mellitus with diabetic nephropathy: Secondary | ICD-10-CM

## 2016-09-03 MED ORDER — ACCU-CHEK FASTCLIX LANCETS MISC: 3 refills | Status: DC

## 2016-09-03 MED ORDER — GLUCOSE BLOOD VI STRP: ORAL_STRIP | 3 refills | Status: DC

## 2016-09-03 NOTE — Telephone Encounter (Signed)
Anna from [facility] pharmacy requesting ACCU-CHEK FASTCLIX LANCETS MISC, glucose blood (ACCU-CHEK GUIDE) test strip to be refilled @ [facility] on [address].

## 2016-09-21 DIAGNOSIS — F319 Bipolar disorder, unspecified: Secondary | ICD-10-CM | POA: Diagnosis not present

## 2016-09-23 DIAGNOSIS — F319 Bipolar disorder, unspecified: Secondary | ICD-10-CM | POA: Diagnosis not present

## 2016-09-29 ENCOUNTER — Telehealth: Payer: Self-pay | Admitting: Dietician

## 2016-09-29 NOTE — Telephone Encounter (Signed)
Patients mother called to say they were not coming to diabetes group visit tomorrow and that her daughter did not get her test strips. Called [facility], they had filled the strips and lancets and then restocked them. There is no charge for her strips or lancets

## 2016-09-30 ENCOUNTER — Ambulatory Visit: Payer: Self-pay | Admitting: Dietician

## 2016-10-18 ENCOUNTER — Other Ambulatory Visit: Payer: Self-pay | Admitting: Internal Medicine

## 2016-11-01 ENCOUNTER — Telehealth: Payer: Self-pay | Admitting: Internal Medicine

## 2016-11-01 NOTE — Progress Notes (Signed)
   CC: Hypertension  HPI:  [name] is a 45 y.o. woman with PMHx as noted below who presents today for follow up of her hypertension. She is accompanied by her mother who is her legal guardian.   HTN: BP controlled at 118/73 today. She is on Lisinopril-HCTZ 10-12.5 mg daily and Metoprolol 25 mg daily. Her mother reports the patient's BP will drop to the 123456 systolic occasionally when they check it at home. Patient denies any dizziness or lightheadedness.  Type 2 DM: Last A1c 8.6, today her A1c is 8.7. Her Metformin was increased last visit to 1000 mg BID. She had been hesitant about starting Victoza last visit but we discussed this medication more and patient is now agreeable. We discussed the benefits of both weight loss and better blood glucose control with Victoza. Patient's mother concerned at her significant weight gain since starting her depression medications.   Thyroid Nodule: Patient found to have a 2.3 cm nodule on the right thyroid lobe back in Dec 2015. She was referred to Endocrinology, but never underwent FNA. She reports weight gain but associates this since starting Depakote. She denies any anxiety, palpitations, fatigue, changes in skin/hair/nails, or changes in appetite.  Schizoaffective Disorder: Reports she has been to many "facilities" lately to help manage her depression. Per chart review, appears she was in the ED during early August for erratic behavior and possible psychosis. She was evaluated by psychiatry and did not meet inpatient criteria. She was evaluated again in the ED in early September and discharged back to [facility] for further placement and psychiatric treatment. She is taking Depakote 500 mg BID, Doxepin 50 mg QHS, and Invega once monthly.  Past Medical History:  Diagnosis Date  . Anemia   . Congenital cerebral palsy ([place])   . Depression   . Diabetes mellitus due to abnormal insulin ([place])   . Endometriosis   . Hearing impairment   . Heart  murmur    at birth  . Hypertension   . Neuromuscular disorder ([place])    Cerebral palsy- very mild left side of brain  . Schizoaffective disorder ([place])    Review of Systems:  All negative except per HPI  Physical Exam:  Vitals:   11/02/16 1532  BP: 118/73  Pulse: 98  Temp: 98.6 F (37 C)  TempSrc: Oral  SpO2: 100%  Weight: 231 lb 14.4 oz (105.2 kg)   General: young woman sitting up, pleasant, NAD HEENT: [place]/AT, EOMI, sclera anicteric, mucus membranes moist CV: RRR, no m/g/r Pulm: CTA bilaterally, breaths non-labored Ext: warm, 1+ peripheral edema, distal pulses 2+ Neuro: alert and oriented x 3 Psych: Appropriate speech and thought content. Blunted affect.  Assessment & Plan:   See Encounters Tab for problem based charting.  Patient discussed with Dr. Dareen Piano

## 2016-11-01 NOTE — Telephone Encounter (Signed)
APT. REMINDER CALL, NO ANSWER, NO VOICEMAIL °

## 2016-11-02 ENCOUNTER — Encounter: Payer: Self-pay | Admitting: Internal Medicine

## 2016-11-02 ENCOUNTER — Encounter (INDEPENDENT_AMBULATORY_CARE_PROVIDER_SITE_OTHER): Payer: Self-pay

## 2016-11-02 ENCOUNTER — Ambulatory Visit (INDEPENDENT_AMBULATORY_CARE_PROVIDER_SITE_OTHER): Payer: Medicare Other | Admitting: Internal Medicine

## 2016-11-02 VITALS — BP 118/73 | HR 98 | Temp 98.6°F | Wt 231.9 lb

## 2016-11-02 DIAGNOSIS — E041 Nontoxic single thyroid nodule: Secondary | ICD-10-CM | POA: Diagnosis not present

## 2016-11-02 DIAGNOSIS — E1121 Type 2 diabetes mellitus with diabetic nephropathy: Secondary | ICD-10-CM

## 2016-11-02 DIAGNOSIS — I10 Essential (primary) hypertension: Secondary | ICD-10-CM | POA: Diagnosis not present

## 2016-11-02 DIAGNOSIS — Z7984 Long term (current) use of oral hypoglycemic drugs: Secondary | ICD-10-CM | POA: Diagnosis not present

## 2016-11-02 DIAGNOSIS — Z79899 Other long term (current) drug therapy: Secondary | ICD-10-CM | POA: Diagnosis not present

## 2016-11-02 DIAGNOSIS — E119 Type 2 diabetes mellitus without complications: Secondary | ICD-10-CM | POA: Diagnosis present

## 2016-11-02 DIAGNOSIS — F258 Other schizoaffective disorders: Secondary | ICD-10-CM | POA: Diagnosis not present

## 2016-11-02 DIAGNOSIS — F259 Schizoaffective disorder, unspecified: Secondary | ICD-10-CM

## 2016-11-02 LAB — POCT GLYCOSYLATED HEMOGLOBIN (HGB A1C): HEMOGLOBIN A1C: 8.7

## 2016-11-02 LAB — GLUCOSE, CAPILLARY: Glucose-Capillary: 197 mg/dL — ABNORMAL HIGH (ref 65–99)

## 2016-11-02 MED ORDER — LIRAGLUTIDE 18 MG/3ML ~~LOC~~ SOPN: PEN_INJECTOR | SUBCUTANEOUS | 5 refills | Status: DC

## 2016-11-02 MED ORDER — LORATADINE 10 MG PO TABS: 10.0000 mg | ORAL_TABLET | Freq: Every day | ORAL | 5 refills | Status: DC

## 2016-11-02 NOTE — Assessment & Plan Note (Signed)
BP well controlled. I am unsure why she is on Metoprolol with no hx of CAD, CHF. I advised her to check her AM BPs over the next few weeks and gave her a log sheet to record her BPs on. Will assess this log at next visit to determine if she really is dropping to low BPs in the 123456 systolic as described by her mother. Continue Lisinopril-HCTZ and Metoprolol for now.

## 2016-11-02 NOTE — Assessment & Plan Note (Signed)
Patient did not get FNA when she was referred to Endo back in 2015. She has since been lost to follow up. Will refer her back to Endo for FNA. We discussed the importance of getting this procedure and determining if her nodule is benign or not.

## 2016-11-02 NOTE — Patient Instructions (Signed)
General Instructions: - Please record your morning blood pressures on a log and bring to the next visit - Start taking Victoza 0.6 mg daily for 1 week - Then increase Victoza to 1.2 mg daily - We will get you back to Dr. Buddy Duty for evaluation of your thyroid nodule. Expect a call from their office in the next few weeks - Follow up in 2 months  Please bring your medicines with you each time you come to clinic.  Medicines may include prescription medications, over-the-counter medications, herbal remedies, eye drops, vitamins, or other pills.   Progress Toward Treatment Goals:  Treatment Goal 09/16/2015  Hemoglobin A1C at goal  Blood pressure at goal    Self Care Goals & Plans:  Self Care Goal 03/13/2015  Manage my medications take my medicines as prescribed; bring my medications to every visit; refill my medications on time  Monitor my health keep track of my blood glucose; bring my glucose meter and log to each visit; check my feet daily  Eat healthy foods drink diet soda or water instead of juice or soda; eat more vegetables; eat foods that are low in salt; eat baked foods instead of fried foods; eat smaller portions  Be physically active find an activity I enjoy; take a walk every day  Meeting treatment goals -    Home Blood Glucose Monitoring 09/16/2015  Check my blood sugar once a day  When to check my blood sugar before breakfast     Care Management & Community Referrals:  Referral 07/30/2014  Referrals made for care management support none needed

## 2016-11-02 NOTE — Assessment & Plan Note (Signed)
A1c has remained stable in high 8.0 range. I think her psychiatric medications are contributing to her weight gain and uncontrolled diabetes. She has agreed to try Victoza. I gave her a sample pen and we reviewed how to administer the medication together and reviewed potential side effects. She will start with Victoza 0.6 mg daily for 1 week and then increase to 1.2 mg daily. Will reassess her blood sugars in 2 months.

## 2016-11-02 NOTE — Assessment & Plan Note (Signed)
Updated her medication list today. Patient unclear about exact doses of new psych meds but will bring updated list to next visit. Her mood is stable at this time. Her schizoaffective disorder is being managed at [facility].

## 2016-11-03 ENCOUNTER — Telehealth: Payer: Self-pay | Admitting: Internal Medicine

## 2016-11-03 NOTE — Progress Notes (Signed)
Internal Medicine Clinic Attending  Case discussed with Dr. Rivet at the time of the visit.  We reviewed the resident's history and exam and pertinent patient test results.  I agree with the assessment, diagnosis, and plan of care documented in the resident's note.  

## 2016-11-03 NOTE — Telephone Encounter (Signed)
APT. REMINDER CALL, NO ANSWER, NO VOICEMAIL °

## 2016-11-04 ENCOUNTER — Ambulatory Visit (INDEPENDENT_AMBULATORY_CARE_PROVIDER_SITE_OTHER): Payer: Medicare Other | Admitting: Dietician

## 2016-11-04 ENCOUNTER — Other Ambulatory Visit: Payer: Self-pay

## 2016-11-04 DIAGNOSIS — Z6839 Body mass index (BMI) 39.0-39.9, adult: Secondary | ICD-10-CM

## 2016-11-04 DIAGNOSIS — E6609 Other obesity due to excess calories: Secondary | ICD-10-CM

## 2016-11-04 DIAGNOSIS — E119 Type 2 diabetes mellitus without complications: Secondary | ICD-10-CM

## 2016-11-04 DIAGNOSIS — Z79899 Other long term (current) drug therapy: Secondary | ICD-10-CM

## 2016-11-04 DIAGNOSIS — Z713 Dietary counseling and surveillance: Secondary | ICD-10-CM

## 2016-11-04 DIAGNOSIS — E1121 Type 2 diabetes mellitus with diabetic nephropathy: Secondary | ICD-10-CM

## 2016-11-04 NOTE — Telephone Encounter (Signed)
Please call pt back regarding med.  

## 2016-11-04 NOTE — Progress Notes (Signed)
  Medical Nutrition Therapy:  Appt start time: [id number] end time:  [id number] Visit # 2  Assessment:  Primary concerns today: glucose and weight management.  [name] attended group diabetes and weight management medical nutrition therapy class today with hr mother.  We discussed success, challenges, goals, ways to stay motivated and how to increase and prepare health fats. Cooking demo done. She asks about medicine side effects. She thought the victoza was insulin and adding to her weight gain.  Her goal is to drink more water, eat healthy snacks and walk for 30 minutes a day.   ANTHROPOMETRICS: weight-232.2# increased 5# in 2 months. , BMI-40, RBW- 150-160#  SLEEP:not assessed today MEDICATIONS: started on victoza recently thought it was insulin and would cause weight gain. BLOOD SUGAR:her A1C was 8.7 % increased form 8.6% DIETARY INTAKE: Patient reports high intake of sweets and candy, trying to stop drinking juice and eat sweets  Usual physical activity: walks 30+ minutes a day  Estimated daily energy needs:  1400-1600 calories 150-170 g carbohydrates   Progress Towards Goal(s):  In progress.   Nutritional Diagnosis:  NI-1.5 Excessive energy intake As related to excess sweets, starches and large food portions.  As evidenced by her report and increasing weight.   Intervention:  Nutrition education about goals, increasing fiber, how to handle challenges.  Coordination of care: recommend her victoza be titrated to 1.10mcg for it sfullenst effct on her weight.   Teaching Method Utilized: Visual, Auditory,Hands on Handouts given during visit include:healhty fats, goals and evaluation of goals form last month.  Barriers to learning/adherence to lifestyle change:limited finances,  mental health issues, medicines and social situation Demonstrated degree of understanding via:  Teach Back   Monitoring/Evaluation:  Dietary intake, exercise, meter, and body weight in 8 week(s). [place],  RD 11/04/2016 1:14 PM.

## 2016-11-30 ENCOUNTER — Emergency Department (HOSPITAL_COMMUNITY)
Admission: EM | Admit: 2016-11-30 | Discharge: 2016-12-01 | Disposition: A | Discharge status: Home / Self Care | Payer: Medicare Other | Attending: Emergency Medicine | Admitting: Emergency Medicine

## 2016-11-30 ENCOUNTER — Encounter (HOSPITAL_COMMUNITY): Payer: Self-pay

## 2016-11-30 ENCOUNTER — Emergency Department (HOSPITAL_COMMUNITY): Payer: Medicare Other

## 2016-11-30 DIAGNOSIS — Z7984 Long term (current) use of oral hypoglycemic drugs: Secondary | ICD-10-CM | POA: Diagnosis not present

## 2016-11-30 DIAGNOSIS — I952 Hypotension due to drugs: Secondary | ICD-10-CM | POA: Insufficient documentation

## 2016-11-30 DIAGNOSIS — Z79899 Other long term (current) drug therapy: Secondary | ICD-10-CM | POA: Insufficient documentation

## 2016-11-30 DIAGNOSIS — E119 Type 2 diabetes mellitus without complications: Secondary | ICD-10-CM | POA: Insufficient documentation

## 2016-11-30 DIAGNOSIS — I10 Essential (primary) hypertension: Secondary | ICD-10-CM | POA: Diagnosis not present

## 2016-11-30 DIAGNOSIS — R42 Dizziness and giddiness: Secondary | ICD-10-CM | POA: Diagnosis present

## 2016-11-30 DIAGNOSIS — I959 Hypotension, unspecified: Secondary | ICD-10-CM | POA: Diagnosis not present

## 2016-11-30 DIAGNOSIS — T447X5A Adverse effect of beta-adrenoreceptor antagonists, initial encounter: Secondary | ICD-10-CM | POA: Diagnosis not present

## 2016-11-30 DIAGNOSIS — N3000 Acute cystitis without hematuria: Secondary | ICD-10-CM | POA: Insufficient documentation

## 2016-11-30 DIAGNOSIS — R51 Headache: Secondary | ICD-10-CM | POA: Diagnosis not present

## 2016-11-30 DIAGNOSIS — Z91010 Allergy to peanuts: Secondary | ICD-10-CM | POA: Insufficient documentation

## 2016-11-30 LAB — CBC WITH DIFFERENTIAL/PLATELET
Basophils Absolute: 0 10*3/uL (ref 0.0–0.1)
Basophils Relative: 0 %
Eosinophils Absolute: 0.1 10*3/uL (ref 0.0–0.7)
Eosinophils Relative: 2 %
HEMATOCRIT: 36.4 % (ref 36.0–46.0)
HEMOGLOBIN: 12.2 g/dL (ref 12.0–15.0)
LYMPHS ABS: 1.8 10*3/uL (ref 0.7–4.0)
LYMPHS PCT: 38 %
MCH: 29 pg (ref 26.0–34.0)
MCHC: 33.5 g/dL (ref 30.0–36.0)
MCV: 86.7 fL (ref 78.0–100.0)
MONOS PCT: 9 %
Monocytes Absolute: 0.4 10*3/uL (ref 0.1–1.0)
NEUTROS ABS: 2.4 10*3/uL (ref 1.7–7.7)
NEUTROS PCT: 51 %
Platelets: 231 10*3/uL (ref 150–400)
RBC: 4.2 MIL/uL (ref 3.87–5.11)
RDW: 13.5 % (ref 11.5–15.5)
WBC: 4.8 10*3/uL (ref 4.0–10.5)

## 2016-11-30 LAB — BASIC METABOLIC PANEL
Anion gap: 10 (ref 5–15)
BUN: 5 mg/dL — AB (ref 6–20)
CHLORIDE: 98 mmol/L — AB (ref 101–111)
CO2: 30 mmol/L (ref 22–32)
CREATININE: 0.7 mg/dL (ref 0.44–1.00)
Calcium: 9.4 mg/dL (ref 8.9–10.3)
GFR calc Af Amer: >60 mL/min (ref >60)
GFR calc non Af Amer: >60 mL/min (ref >60)
Glucose, Bld: 135 mg/dL — ABNORMAL HIGH (ref 65–99)
Potassium: 3.1 mmol/L — ABNORMAL LOW (ref 3.5–5.1)
SODIUM: 138 mmol/L (ref 135–145)

## 2016-11-30 LAB — URINALYSIS, ROUTINE W REFLEX MICROSCOPIC
BILIRUBIN URINE: NEGATIVE
Glucose, UA: NEGATIVE mg/dL
KETONES UR: NEGATIVE mg/dL
NITRITE: NEGATIVE
PH: 7 (ref 5.0–8.0)
Protein, ur: NEGATIVE mg/dL
SPECIFIC GRAVITY, URINE: 1.012 (ref 1.005–1.030)

## 2016-11-30 LAB — I-STAT CG4 LACTIC ACID, ED
LACTIC ACID, VENOUS: 2.53 mmol/L — AB (ref 0.5–1.9)
LACTIC ACID, VENOUS: 2.07 mmol/L — AB (ref 0.5–1.9)

## 2016-11-30 MED ORDER — DEXTROSE 5 % IV SOLN
1.0000 g | Freq: Once | INTRAVENOUS | Status: AC
  Administered 2016-11-30: 1 g via INTRAVENOUS
  Filled 2016-11-30: qty 10

## 2016-11-30 MED ORDER — SODIUM CHLORIDE 0.9 % IV BOLUS (SEPSIS)
1000.0000 mL | Freq: Once | INTRAVENOUS | Status: AC
  Administered 2016-11-30: 1000 mL via INTRAVENOUS

## 2016-11-30 MED ORDER — SODIUM CHLORIDE 0.9 % IV BOLUS (SEPSIS): 500.0000 mL | Freq: Once | INTRAVENOUS | Status: DC

## 2016-11-30 NOTE — ED Notes (Signed)
Pt returned from xray and connected to the monitor 

## 2016-11-30 NOTE — ED Notes (Signed)
Patient transported to X-ray 

## 2016-11-30 NOTE — ED Provider Notes (Signed)
[facility] DEPT Provider Note   CSN: [id number] Arrival date & time: 11/30/16  1725     History   Chief Complaint Chief Complaint  Patient presents with  . Hypotension  . Facial Swelling    HPI [name] is a 46 y.o. female.  HPI 46 year old female presenting with hypotension and lightheadedness. She states that she is on metoprolol and lisinopril/HCTZ for hypertension and her mother checks her blood pressure at home and she has been consistently low. She had a blood pressure 90/50 and she called her doctor and was told to come to the emergency department for further evaluation. She denies any fevers, chest pain, shortness of breath, abdominal pain, vomiting, diarrhea, dysuria. She states that she feels lightheaded when she stands up or on exertion but otherwise has no complaints. She is told her doctor about her low blood pressures at home however she is told to continue with her hypertensive medications. Her symptoms worsen with exertion. Rest makes her symptoms better. Chest states that she feels across her face is swollen that started today. She denies any difficulty swallowing, difficulty breathing or dysphonia.  Past Medical History:  Diagnosis Date  . Anemia   . Congenital cerebral palsy ([place])   . Depression   . Diabetes mellitus due to abnormal insulin ([place])   . Endometriosis   . Hearing impairment   . Heart murmur    at birth  . Hypertension   . Neuromuscular disorder ([place])    Cerebral palsy- very mild left side of brain  . Schizoaffective disorder [facility])     Patient Active Problem List   Diagnosis Date Noted  . Pain in right shoulder 08/02/2016  . Schizoaffective disorder, chronic condition ([place]) 06/18/2016  . Sinus headache 01/20/2016  . Anemia due to chronic blood loss   . Hyperlipidemia LDL goal <70 09/10/2014  . Thyroid nodule 07/30/2014  . Morbid obesity ([place]) 07/30/2014  . Depression, controlled 07/30/2014  . Preventative health care  07/30/2014  . Essential hypertension 11/07/2012  . Type 2 diabetes mellitus ([place]) 11/07/2012  . LEARNING DISABILITY 09/26/2007  . FIBROCYSTIC BREAST DISEASE 09/26/2007  . ENDOMETRIOSIS 09/26/2007    Past Surgical History:  Procedure Laterality Date  . CESAREAN SECTION    . EXPLORATORY LAPAROTOMY WITH ABDOMINAL MASS EXCISION    . MYOMECTOMY N/A 08/05/2015   Procedure: Abdominal Hysterectomy, Bilateral Salpingectomy;  Surgeon: Osborne Oman, MD;  Location: [facility];  Service: Gynecology;  Laterality: N/A;    OB History    Gravida Para Term Preterm AB Living   '1 1 1     1   '$ SAB TAB Ectopic Multiple Live Births                   Home Medications    Prior to Admission medications   Medication Sig Start Date End Date Taking? Authorizing Provider  atorvastatin (LIPITOR) 40 MG tablet Take 1 tablet (40 mg total) by mouth daily. 08/02/16 08/02/17 Yes Lorella Nimrod, MD  benztropine (COGENTIN) 0.5 MG tablet Take 0.5 mg by mouth 2 (two) times daily. MORNING (0900) AND NIGHT (2100) 11/10/16  Yes Historical Provider, MD  divalproex (DEPAKOTE ER) 500 MG 24 hr tablet Take 500 mg by mouth 2 (two) times daily. MORNING (0900) AND NIGHT (2100)   Yes Historical Provider, MD  doxepin (SINEQUAN) 50 MG capsule Take 50 mg by mouth at bedtime.   Yes Historical Provider, MD  DULoxetine (CYMBALTA) 30 MG capsule Take 30 mg by mouth daily. 11/12/16  Yes Historical Provider, MD  gabapentin (NEURONTIN) 300 MG capsule Take 1 capsule (300 mg total) by mouth 2 (two) times daily. 08/02/16  Yes Lorella Nimrod, MD  liraglutide 18 MG/3ML SOPN Use 0.6 mg daily for 1 week. Then increase to 1.2 mg daily. Patient taking differently: Inject 1.2 mg into the skin daily.  11/02/16  Yes Carly Montey Hora, MD  loratadine (CLARITIN) 10 MG tablet Take 1 tablet (10 mg total) by mouth daily. 11/02/16  Yes Juliet Rude, MD  metFORMIN (GLUCOPHAGE) 1000 MG tablet Take 1 tablet (1,000 mg total) by mouth 2 (two) times daily with a meal. 08/02/16   Yes Lorella Nimrod, MD  metoprolol succinate (TOPROL-XL) 25 MG 24 hr tablet Take 1 tablet (25 mg total) by mouth daily. 08/02/16  Yes Lorella Nimrod, MD  paliperidone (INVEGA SUSTENNA) 156 MG/ML SUSP injection Inject 156 mg into the muscle every 30 (thirty) days.    Yes Historical Provider, MD  promethazine (PHENERGAN) 12.5 MG tablet Take 1 tablet (12.5 mg total) by mouth every 6 (six) hours as needed for nausea or vomiting. 09/16/15  Yes Juliet Rude, MD  ACCU-CHEK FASTCLIX LANCETS MISC Check blood sugar up to 1 time a day as instructed 09/03/16   Juliet Rude, MD  blood glucose meter kit and supplies KIT Dispense based on patient and insurance preference. Use up to four times daily as directed. (FOR ICD-9 250.00, 250.01). 08/02/16   Lorella Nimrod, MD  cephALEXin (KEFLEX) 500 MG capsule Take 1 capsule (500 mg total) by mouth 4 (four) times daily. 12/01/16 12/06/16  Sheryle Vice [place] Jadee Golebiewski, MD  fluticasone (FLONASE) 50 MCG/ACT nasal spray Place 1 spray into both nostrils daily. Patient not taking: Reported on 11/30/2016 01/20/16 01/19/17  Loleta Chance, MD  glucose blood (ACCU-CHEK GUIDE) test strip Check blood sugar up to 1 time a day as instructed 09/03/16   Juliet Rude, MD  ibuprofen (ADVIL,MOTRIN) 800 MG tablet Take 1 tablet (800 mg total) by mouth every 8 (eight) hours as needed for mild pain or moderate pain. Patient not taking: Reported on 11/30/2016 09/19/15   Clayton Bibles, PA-C    Family History Family History  Problem Relation Age of Onset  . Heart failure Mother   . Hypertension Mother   . Diabetes Mellitus II Mother   . Cancer Mother   . Diabetes Mellitus II Father   . Hypertension Father     Social History Social History  Substance Use Topics  . Smoking status: Never Smoker  . Smokeless tobacco: Never Used  . Alcohol use No     Allergies   Bactrim [sulfamethoxazole-trimethoprim]; Ivp dye [iodinated diagnostic agents]; Pollen extract; Sulfa antibiotics; and Peanut-containing drug  products   Review of Systems Review of Systems  Constitutional: Negative for chills and fever.  HENT: Negative for ear pain and sore throat.   Eyes: Negative for pain and visual disturbance.  Respiratory: Negative for cough and shortness of breath.   Cardiovascular: Negative for chest pain and palpitations.  Gastrointestinal: Negative for abdominal pain and vomiting.  Genitourinary: Negative for dysuria and hematuria.  Musculoskeletal: Negative for arthralgias, back pain and gait problem.  Skin: Negative for color change and rash.  Neurological: Positive for light-headedness. Negative for dizziness, seizures, syncope, speech difficulty and weakness.  All other systems reviewed and are negative.    Physical Exam Updated Vital Signs BP 122/69   Pulse 78   Temp 99.3 F (37.4 C) (Oral)   Resp 16   Ht '5\' 4"'$  (  1.626 m)   Wt 108.9 kg   LMP 07/23/2015 (Exact Date)   SpO2 100%   BMI 41.20 kg/m   Physical Exam  Constitutional: She is oriented to person, place, and time. She appears well-developed and well-nourished. She does not appear ill.  Obese female  HENT:  Head: Normocephalic and atraumatic.  Mouth/Throat: Uvula is midline, oropharynx is clear and moist and mucous membranes are normal.  Eyes: Conjunctivae and EOM are normal. Pupils are equal, round, and reactive to light.  Neck: Trachea normal, normal range of motion, full passive range of motion without pain and phonation normal. Neck supple. No spinous process tenderness and no muscular tenderness present. No tracheal deviation and normal range of motion present.  Cardiovascular: Normal rate, regular rhythm, S1 normal, S2 normal, normal heart sounds, intact distal pulses and normal pulses.  Exam reveals no gallop and no friction rub.   No murmur heard. Pulmonary/Chest: Effort normal and breath sounds normal. She has no decreased breath sounds. She has no wheezes. She has no rhonchi.  Abdominal: Soft. She exhibits no  distension. There is no tenderness. There is no tenderness at McBurney's point and negative Murphy's sign.  Musculoskeletal: She exhibits no edema.  Neurological: She is alert and oriented to person, place, and time. She has normal strength. No cranial nerve deficit or sensory deficit. GCS eye subscore is 4. GCS verbal subscore is 5. GCS motor subscore is 6.  Skin: Skin is warm. Capillary refill takes less than 2 seconds.  Nursing note and vitals reviewed.    ED Treatments / Results  Labs (all labs ordered are listed, but only abnormal results are displayed) Labs Reviewed  BASIC METABOLIC PANEL - Abnormal; Notable for the following:       Result Value   Potassium 3.1 (*)    Chloride 98 (*)    Glucose, Bld 135 (*)    BUN 5 (*)    All other components within normal limits  URINALYSIS, ROUTINE W REFLEX MICROSCOPIC - Abnormal; Notable for the following:    APPearance CLOUDY (*)    Hgb urine dipstick SMALL (*)    Leukocytes, UA LARGE (*)    Bacteria, UA MANY (*)    Squamous Epithelial / LPF TOO NUMEROUS TO COUNT (*)    All other components within normal limits  I-STAT CG4 LACTIC ACID, ED - Abnormal; Notable for the following:    Lactic Acid, Venous 2.53 (*)    All other components within normal limits  I-STAT CG4 LACTIC ACID, ED - Abnormal; Notable for the following:    Lactic Acid, Venous 2.07 (*)    All other components within normal limits  CBC WITH DIFFERENTIAL/PLATELET  I-STAT CG4 LACTIC ACID, ED    EKG  EKG Interpretation None       Radiology Dg Chest 2 View  Result Date: 11/30/2016 CLINICAL DATA:  Hypotensive, head tightness and blurry vision. History of diabetes, cerebral palsy. EXAM: CHEST  2 VIEW COMPARISON:  Chest radiograph October 26, 2013 FINDINGS: Cardiomediastinal silhouette is normal. No pleural effusions or focal consolidations. Trachea projects midline and there is no pneumothorax. Soft tissue planes and included osseous structures are non-suspicious.  IMPRESSION: Normal chest. Electronically Signed   By: Elon Alas M.D.   On: 11/30/2016 22:46    Procedures Procedures (including critical care time)  Medications Ordered in ED Medications  cefTRIAXone (ROCEPHIN) 1 g in dextrose 5 % 50 mL IVPB (1 g Intravenous New Bag/Given 11/30/16 2347)  sodium chloride 0.9 % bolus  1,000 mL (0 mLs Intravenous Stopped 11/30/16 2226)  sodium chloride 0.9 % bolus 1,000 mL (0 mLs Intravenous Stopped 11/30/16 2348)     Initial Impression / Assessment and Plan / ED Course  I have reviewed the triage vital signs and the nursing notes.  Pertinent labs & imaging results that were available during my care of the patient were reviewed by me and considered in my medical decision making (see chart for details).  Clinical Course    46 year old female presenting with symptomatic hypotension and concern for facial swelling. Exam is as above with no signs of facial swelling or oropharyngeal edema. No dysphonia or deviated uvula. Nonfocal neuro exam. Labs notable for a lactic acid 2.5. Chest x-ray and urinalysis ordered. Patient given 2 L normal saline.  Chest x-ray is unremarkable with no focal opacity concerning for pneumonia. Urinalysis concerning for UTI. After the 2 L of saline patient's blood pressure is 122/69 and she states that she feels less lightheaded. Repeat lactate 2.07. Patient given 1 dose of Rocephin and a prescription for Keflex. She was instructed to stop taking the lisinopril/HCTZ and follow up with her primary care provider to reassess blood pressure management. Patient and mother amenable to this plan. Strict return precautions given and patient discharged in good condition.    Final Clinical Impressions(s) / ED Diagnoses   Final diagnoses:  Acute cystitis without hematuria  Hypotension due to drugs    New Prescriptions New Prescriptions   CEPHALEXIN (KEFLEX) 500 MG CAPSULE    Take 1 capsule (500 mg total) by mouth 4 (four) times  daily.     Deo Mehringer [place] Fontella Shan, MD 12/01/16 3220    Duffy Bruce, MD 12/01/16 [id number]

## 2016-11-30 NOTE — ED Triage Notes (Signed)
Per Pt's family, Pt has been having hypotension for the past month. Family reports MD reports if it dropped too  Low to come into the ED. Family recorded 90/56 at 1650. Reports "tightness in her head and swelling." Reports some blurred vision.

## 2016-12-01 MED ORDER — CEPHALEXIN 500 MG PO CAPS: 500.0000 mg | ORAL_CAPSULE | Freq: Four times a day (QID) | ORAL | 0 refills | Status: AC

## 2016-12-01 NOTE — Discharge Instructions (Signed)
Please stop taking lisinopril/HCTZ and follow up with your PCP.

## 2016-12-08 ENCOUNTER — Ambulatory Visit (INDEPENDENT_AMBULATORY_CARE_PROVIDER_SITE_OTHER): Payer: Medicare Other | Admitting: Internal Medicine

## 2016-12-08 VITALS — BP 111/80 | HR 81 | Temp 98.8°F | Wt 230.5 lb

## 2016-12-08 DIAGNOSIS — Z5189 Encounter for other specified aftercare: Secondary | ICD-10-CM

## 2016-12-08 DIAGNOSIS — B3741 Candidal cystitis and urethritis: Secondary | ICD-10-CM

## 2016-12-08 DIAGNOSIS — I10 Essential (primary) hypertension: Secondary | ICD-10-CM | POA: Diagnosis not present

## 2016-12-08 DIAGNOSIS — R0602 Shortness of breath: Secondary | ICD-10-CM | POA: Insufficient documentation

## 2016-12-08 DIAGNOSIS — H90A21 Sensorineural hearing loss, unilateral, right ear, with restricted hearing on the contralateral side: Secondary | ICD-10-CM | POA: Diagnosis not present

## 2016-12-08 DIAGNOSIS — H905 Unspecified sensorineural hearing loss: Secondary | ICD-10-CM | POA: Insufficient documentation

## 2016-12-08 DIAGNOSIS — R609 Edema, unspecified: Secondary | ICD-10-CM

## 2016-12-08 DIAGNOSIS — R600 Localized edema: Secondary | ICD-10-CM

## 2016-12-08 DIAGNOSIS — E041 Nontoxic single thyroid nodule: Secondary | ICD-10-CM

## 2016-12-08 DIAGNOSIS — N3090 Cystitis, unspecified without hematuria: Secondary | ICD-10-CM | POA: Insufficient documentation

## 2016-12-08 DIAGNOSIS — R601 Generalized edema: Secondary | ICD-10-CM | POA: Diagnosis not present

## 2016-12-08 MED ORDER — FUROSEMIDE 20 MG PO TABS: 20.0000 mg | ORAL_TABLET | Freq: Every day | ORAL | 0 refills | Status: DC

## 2016-12-08 NOTE — Patient Instructions (Signed)
It was a pleasure to see you today [name].  You have significant swelling that I think is worsening your facial and abdominal pain. I recommend trying to take Lasix (furosemide) 20mg  daily once daily until we see you again next week. Make sure to monitor blood pressure at home since this medicine can cause some reduction.  I placed a new referral to ENT clinic our staff will contact them within the next 1-2 days about arranging a new appointment.  We will check blood and urine test today and see if there is anything going on with your kidneys or liver that would cause you to have so much swelling.  I would like to see you again in clinic sometime next week.

## 2016-12-08 NOTE — Progress Notes (Signed)
   CC: Emergency department follow up for acute cystitis  HPI:  [name] is a 46 y.o. woman with a history of congenital hearing deficits, T2DM, mild cerebral palsy, and schizoaffective disorder here today for follow up of an ED visit for acute cystitis. She was feeling mild dysuria but also some increased urinary urgency with frequency. Her symptoms are partially but not fully resolved since that visit. She has completed 3 and 1/2 days of the prescribed cephalexin without problems. She denies any fever, flank pain, or other abdominal symptoms. Her biggest complaint today is a sensation of being very swollen in her face, abdomen, and legs.  See problem based assessment and plan below for additional details  Past Medical History:  Diagnosis Date  . Anemia   . Congenital cerebral palsy ([place])   . Depression   . Diabetes mellitus due to abnormal insulin ([place])   . Endometriosis   . Hearing impairment   . Heart murmur    at birth  . Hypertension   . Neuromuscular disorder ([place])    Cerebral palsy- very mild left side of brain  . Schizoaffective disorder ([place])     Review of Systems:  Review of Systems  Constitutional: Negative for fever.  HENT: Negative for hearing loss.   Eyes: Negative for blurred vision.  Respiratory: Negative for shortness of breath.   Cardiovascular: Positive for leg swelling. Negative for chest pain.  Gastrointestinal: Negative for abdominal pain.  Genitourinary: Positive for frequency and urgency. Negative for hematuria.  Skin: Negative for rash.  Neurological: Positive for headaches.    Physical Exam: Physical Exam  Constitutional:  Obese woman, young appearing for age, in no acute distress  HENT:  Head: Normocephalic and atraumatic.  Cardiovascular: Normal rate and regular rhythm.   Pulmonary/Chest: Effort normal and breath sounds normal.  Abdominal:  Soft but very obese/protuberent  Musculoskeletal:  2+ pitting edema of both legs up to the  knees  Skin: No rash noted.  Psychiatric:  Flat affect, appropriate speech    Vitals:   12/08/16 0855  BP: 111/80  Pulse: 81  Temp: 98.8 F (37.1 C)  TempSrc: Oral  SpO2: 99%  Weight: 230 lb 8 oz (104.6 kg)    Assessment & Plan:   See Encounters Tab for problem based charting.  Patient discussed with Dr. Lynnae January

## 2016-12-09 LAB — TSH: TSH: 1.06 u[IU]/mL (ref 0.450–4.500)

## 2016-12-09 LAB — URINALYSIS, ROUTINE W REFLEX MICROSCOPIC
Bilirubin, UA: NEGATIVE
Glucose, UA: NEGATIVE
NITRITE UA: NEGATIVE
Protein, UA: NEGATIVE
RBC, UA: NEGATIVE
Specific Gravity, UA: 1.018 (ref 1.005–1.030)
Urobilinogen, Ur: 0.2 mg/dL (ref 0.2–1.0)
pH, UA: 5.5 (ref 5.0–7.5)

## 2016-12-09 LAB — CMP14 + ANION GAP
ALK PHOS: 60 IU/L (ref 39–117)
ALT: 12 IU/L (ref 0–32)
AST: 10 IU/L (ref 0–40)
Albumin/Globulin Ratio: 1.6 (ref 1.2–2.2)
Albumin: 3.8 g/dL (ref 3.5–5.5)
Anion Gap: 16 mmol/L (ref 10.0–18.0)
BUN / CREAT RATIO: 9 (ref 9–23)
BUN: 5 mg/dL — ABNORMAL LOW (ref 6–24)
Bilirubin Total: <0.2 mg/dL (ref 0.0–1.2)
CO2: 24 mmol/L (ref 18–29)
CREATININE: 0.58 mg/dL (ref 0.57–1.00)
Calcium: 8.7 mg/dL (ref 8.7–10.2)
Chloride: 101 mmol/L (ref 96–106)
GFR, EST AFRICAN AMERICAN: 131 mL/min/{1.73_m2} (ref >59)
GFR, EST NON AFRICAN AMERICAN: 113 mL/min/{1.73_m2} (ref >59)
GLOBULIN, TOTAL: 2.4 g/dL (ref 1.5–4.5)
Glucose: 93 mg/dL (ref 65–99)
Potassium: 3.7 mmol/L (ref 3.5–5.2)
SODIUM: 141 mmol/L (ref 134–144)
TOTAL PROTEIN: 6.2 g/dL (ref 6.0–8.5)

## 2016-12-09 LAB — MICROSCOPIC EXAMINATION
CASTS: NONE SEEN /LPF
Epithelial Cells (non renal): >10 /hpf — AB (ref 0–10)

## 2016-12-10 NOTE — Assessment & Plan Note (Signed)
She has congenital deafness of the right ear and uses a hearing aid in her left. She is having increased trouble with this aid and thinks it might need to be adjusted or replaced. She was previously seen for this at [facility].  -Referral to ENT

## 2016-12-10 NOTE — Assessment & Plan Note (Signed)
Her blood pressures are quite low on review of her recent ED visit and her home BP log. She was discontinued from her lisinopril-HCTZ at the ED visit but remains normotensive. I am also not sure why she is taking metoprolol as I do not see an obvious indication without documented heart failure or tachycardia.  -Discontinue metoprolol -Follow up next week if possible

## 2016-12-10 NOTE — Assessment & Plan Note (Signed)
She has partially improved symptoms on treatment for uncomplicated cystitis. However I am re-checking urine studies today for abnormalities since her symptoms did not fully improve.  UA today is notable for yeast and continued pyuria with no bacteria. Urinary tract colonization with yeast particularly alongside diabetes is not uncommon and asymptomatic patients generally are not recommended to receive treatment. It is unclear if I can attribute symptoms to this today as she is not finished treating for a bacterial UTI.  She has a follow up appointment with [place] next week on 1/31. If her symptoms are not fully improved with completing the antibiotic course I would recommend proceeding with a 2 week course of fluconazole.

## 2016-12-10 NOTE — Assessment & Plan Note (Addendum)
[name] has extensive edema on examination today that appears more than I see previously documented in recent encounters. She is very symptomatic of this with a sense of tightness in her face, abdomen, and legs. She has no known cardiac, renal, or liver disease to explain this finding. Her only recent medication changes I see are stopping some antihypertensives and increase dose of her valproic acid. Edema is a reported side effect in about 4% of patients per drug information review with valproic acid but this is unusual as she was on the medication for years without this in the past. She also has a known thyroid nodule that has not been fully worked up although this would rarely lead to a thyroid deficiency. If present, hypothyroidism could lead to significant body edema.  -CMP, TSH, UA today -Prescribed a week long course of Lasix 20mg  once daily -If no obvious renal or liver abnormality seen wound recommend arranging an echocardiogram

## 2016-12-11 NOTE — Progress Notes (Signed)
Internal Medicine Clinic Attending  Case discussed with Dr. Rice at the time of the visit.  We reviewed the resident's history and exam and pertinent patient test results.  I agree with the assessment, diagnosis, and plan of care documented in the resident's note.  

## 2016-12-14 DIAGNOSIS — Z79899 Other long term (current) drug therapy: Secondary | ICD-10-CM | POA: Diagnosis not present

## 2016-12-15 ENCOUNTER — Other Ambulatory Visit: Payer: Self-pay | Admitting: Internal Medicine

## 2016-12-15 ENCOUNTER — Ambulatory Visit (INDEPENDENT_AMBULATORY_CARE_PROVIDER_SITE_OTHER): Payer: Medicare Other | Admitting: Internal Medicine

## 2016-12-15 ENCOUNTER — Encounter: Payer: Self-pay | Admitting: Internal Medicine

## 2016-12-15 VITALS — BP 134/75 | HR 86 | Temp 98.4°F | Wt 230.3 lb

## 2016-12-15 DIAGNOSIS — I10 Essential (primary) hypertension: Secondary | ICD-10-CM | POA: Diagnosis present

## 2016-12-15 DIAGNOSIS — R609 Edema, unspecified: Secondary | ICD-10-CM

## 2016-12-15 DIAGNOSIS — R600 Localized edema: Secondary | ICD-10-CM

## 2016-12-15 DIAGNOSIS — E118 Type 2 diabetes mellitus with unspecified complications: Secondary | ICD-10-CM | POA: Diagnosis not present

## 2016-12-15 DIAGNOSIS — R601 Generalized edema: Secondary | ICD-10-CM

## 2016-12-15 MED ORDER — FUROSEMIDE 20 MG PO TABS: 20.0000 mg | ORAL_TABLET | Freq: Every day | ORAL | 0 refills | Status: DC | PRN

## 2016-12-15 NOTE — Patient Instructions (Addendum)
It was a pleasure to see you today [name].  I think we can just keep you off of these blood pressure medications. If your urine shows protein in it due to diabetes we may have to restart the lisinopril but probably at a lower dose.  I would like to get an ultrasound of your heart (echocardiogram) sometime in the next few weeks to make sure there is no heart problem contributing to this swelling. In the meantime taking lasix 20mg  daily AS NEEDED for return of increased leg swelling should be fine.  You need to follow up with your psychiatrist and continue discussing what medication might be safe to try adjusting since this is the most likely explanation for your weight and swelling increases as your blood tests here were all normal.

## 2016-12-15 NOTE — Progress Notes (Signed)
   CC: follow up for swelling, hypertension  HPI:  [name] is a 46 y.o. woman here today in a one week follow up for her swelling and her hypertension medication changes.   See problem based assessment and plan below for additional details  Past Medical History:  Diagnosis Date  . Anemia   . Congenital cerebral palsy ([place])   . Depression   . 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 Endometriosis   . Hearing impairment   . Heart murmur    at birth  . Hypertension   . Neuromuscular disorder ([place])    Cerebral palsy- very mild left side of brain  . Schizoaffective disorder ([place])     Review of Systems:  Review of Systems  Respiratory: Negative for shortness of breath.   Cardiovascular: Positive for leg swelling. Negative for chest pain and palpitations.  Genitourinary: Negative for dysuria.  Skin: Negative for rash.    Physical Exam: Physical Exam  Constitutional:  Obese woman in no acute distress  Cardiovascular: Normal rate and regular rhythm.   Pulmonary/Chest: Effort normal and breath sounds normal.  Musculoskeletal: She exhibits no tenderness.  Trace pedal edema  Skin: No rash noted.  Psychiatric:  Flat affect    Vitals:   12/15/16 0908  BP: 134/75  Pulse: 86  Temp: 98.4 F (36.9 C)  TempSrc: Oral  SpO2: 99%  Weight: 230 lb 4.8 oz (104.5 kg)    Assessment & Plan:   See Encounters Tab for problem based charting.  Patient discussed with Dr. Dareen Piano

## 2016-12-16 LAB — MICROALBUMIN / CREATININE URINE RATIO
CREATININE, UR: 93.1 mg/dL
MICROALB/CREAT RATIO: 3.7 mg/g{creat} (ref 0.0–30.0)
MICROALBUM., U, RANDOM: 3.4 ug/mL

## 2016-12-18 NOTE — Assessment & Plan Note (Signed)
HPI: Well controlled hypertension with some episodes of low blood pressure during her recent ED visit for swelling. She discontinued metoprolol a week ago and has felt well over the interval.  A: Blood pressure remains controlled on no medications  P: Routine observation and could resume treatment with lisinopril if she becomes hypertensive in the future

## 2016-12-18 NOTE — Assessment & Plan Note (Addendum)
HPI: Her leg swelling is improved since our last visit with a low daily dose of lasix. Her weight has not significantly changed during the same time. Blood work from last week demonstrated no significant abnormalities in renal or hepatic function as an underlying cause.  A: Peripheral edema improved with oral diuretics Fluid retention may be related to her antipsychotic medications or her obesity. However full evaluation also needs to rule out a cardiac cause for this. If she has underlying dysfunction her weight gain might exacerbate this. Withdrawal of HCTZ as a mild diuretic could have also have also allowed more fluid retention.  P: -Outpatient echocardiogram -Prescription for 20mg  lasix to use daily PRN for swelling -She will discuss in follow up with psychiatrist if any contributory medicines could possibly be decreased without high risk of severe symptoms

## 2016-12-18 NOTE — Assessment & Plan Note (Signed)
HPI: She discontinued treatment with lisinopril due to symptomatic hypotension. She has previously gained a low of weight with increase in antipsychotics last year but fairly stable since September. She is not reporting and adverse effects with Victoza.  A: Type 2 diabetes without complications Not at goal of Hgb A1c <7% as of last checked in December  P: -Since I recommended she stop ACE inhibitor therapy I'll check for microalbuminuria today

## 2016-12-20 NOTE — Progress Notes (Signed)
Internal Medicine Clinic Attending  Case discussed with Dr. Rice at the time of the visit.  We reviewed the resident's history and exam and pertinent patient test results.  I agree with the assessment, diagnosis, and plan of care documented in the resident's note.  

## 2016-12-23 ENCOUNTER — Ambulatory Visit (HOSPITAL_COMMUNITY): Admission: RE | Admit: 2016-12-23 | Payer: Medicare Other | Source: Ambulatory Visit

## 2016-12-27 DIAGNOSIS — H40003 Preglaucoma, unspecified, bilateral: Secondary | ICD-10-CM | POA: Diagnosis not present

## 2016-12-27 DIAGNOSIS — I10 Essential (primary) hypertension: Secondary | ICD-10-CM | POA: Diagnosis not present

## 2016-12-27 DIAGNOSIS — H25013 Cortical age-related cataract, bilateral: Secondary | ICD-10-CM | POA: Diagnosis not present

## 2016-12-27 DIAGNOSIS — Z7984 Long term (current) use of oral hypoglycemic drugs: Secondary | ICD-10-CM | POA: Diagnosis not present

## 2016-12-27 DIAGNOSIS — H40013 Open angle with borderline findings, low risk, bilateral: Secondary | ICD-10-CM | POA: Diagnosis not present

## 2016-12-27 DIAGNOSIS — H35033 Hypertensive retinopathy, bilateral: Secondary | ICD-10-CM | POA: Diagnosis not present

## 2016-12-27 DIAGNOSIS — E119 Type 2 diabetes mellitus without complications: Secondary | ICD-10-CM | POA: Diagnosis not present

## 2016-12-27 LAB — HM DIABETES EYE EXAM

## 2016-12-28 ENCOUNTER — Ambulatory Visit (HOSPITAL_COMMUNITY)
Admission: RE | Admit: 2016-12-28 | Discharge: 2016-12-28 | Disposition: A | Discharge status: Home / Self Care | Payer: Medicare Other | Source: Ambulatory Visit | Attending: Internal Medicine | Admitting: Internal Medicine

## 2016-12-28 DIAGNOSIS — R601 Generalized edema: Secondary | ICD-10-CM

## 2016-12-28 DIAGNOSIS — I501 Left ventricular failure: Secondary | ICD-10-CM | POA: Insufficient documentation

## 2016-12-28 DIAGNOSIS — I509 Heart failure, unspecified: Secondary | ICD-10-CM | POA: Diagnosis present

## 2016-12-28 NOTE — Progress Notes (Signed)
  Echocardiogram 2D Echocardiogram has been performed.  Diamond Nickel 12/28/2016, 8:38 AM

## 2016-12-29 ENCOUNTER — Encounter: Payer: Self-pay | Admitting: *Deleted

## 2017-01-10 ENCOUNTER — Telehealth: Payer: Self-pay | Admitting: Internal Medicine

## 2017-01-10 NOTE — Progress Notes (Signed)
   CC: Hypertension follow up  HPI:  [name] is a 46 y.o. woman with PMHx as noted below who presents today for follow up of her hypertension.  Hx HTN: BP 120/92 today while not on medications. Her BP medications were discontinued over the last 2 months due to episodes of hypotension.   Thyroid Nodule: Discovered in 2011 incidentally on CT scan. She then had a soft tissue US of her neck in 2015 which showed a 2.3 cm right lower pole thyroid nodule. She was referred to Endo for FNA but this never was completed. She was then lost to follow up. TSH checked in 2015 was normal at 1.401 and again in Jan this year and was normal at 1.060. She does note weight gain but attributes this to her psychiatric medications. She denies changes in hair/skin/nails.   Type 2 DM: Last A1c 8.7, today her A1c is 6.5. She was started on Liraglutide last visit and reports compliance with this medication. She is also taking Metformin 1000 mg BID. She was on an ACE-inhibitor but this was stopped in January due to hypotension. Per review of her glucometer, blood sugars mostly in the 100-160 range with very few BGs >180. Denies any hypoglycemia.   Hx anemia related to fibroids: s/p hysterectomy 08/05/15. Denies any bleeding. She stopped taking iron after this procedure.   Diffuse Edema: Reports she is still experiencing swelling in her legs and abdomen. She reports her abdomen feels tense. She reports shortness of breath, orthopnea, and occasional sharp chest pain. She did notice a mild difference in her symptoms when she was on a trial of Lasix 20 mg daily. She had an echocardiogram on 2/13 which showed an EF 50-55%, no wall motion abnormalities, and grade 1 diastolic dysfunction. She reports her psychiatrist told her that the swelling is likely from her Depakote. Plan per patient is to stop this medication on Friday (3/2).   Past Medical History:  Diagnosis Date  . Anemia   . Congenital cerebral palsy ([place])   .  Depression   . 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 Endometriosis   . Hearing impairment   . Heart murmur    at birth  . Hypertension   . Neuromuscular disorder ([place])    Cerebral palsy- very mild left side of brain  . Schizoaffective disorder ([place])     Review of Systems:   General: Denies fever, chills, night sweats, changes in appetite HEENT: Denies headaches, ear pain, changes in vision, rhinorrhea, sore throat CV: See HPI Pulm: Denies cough, wheezing GI: Denies abdominal pain, nausea, vomiting, diarrhea, constipation, melena, hematochezia GU: Denies dysuria, hematuria, frequency Msk: Denies muscle cramps, joint pains Neuro: Denies weakness, numbness, tingling Skin: Denies rashes, bruising Psych: Denies anxiety  Physical Exam:  Vitals:   01/11/17 1326  BP: (!) 120/92  Pulse: 91  Temp: 98 F (36.7 C)  TempSrc: Oral  SpO2: 100%  Weight: 228 lb 8 oz (103.6 kg)  Height: 5\' 4"  (1.626 m)   General: Young obese woman sitting up in chair, NAD CV: RRR, no m/g/r Pulm: CTA bilaterally, breaths non-labored Abd: BS+, soft, obese, distended, non-tender  Ext: 2+ peripheral edema bilaterally   Assessment & Plan:   See Encounters Tab for problem based charting.  Patient discussed with Dr. Lynnae January

## 2017-01-10 NOTE — Telephone Encounter (Signed)
APT. REMINDER CALL, LMTCB °

## 2017-01-11 ENCOUNTER — Ambulatory Visit (INDEPENDENT_AMBULATORY_CARE_PROVIDER_SITE_OTHER): Payer: Medicare Other | Admitting: Internal Medicine

## 2017-01-11 ENCOUNTER — Encounter: Payer: Self-pay | Admitting: Internal Medicine

## 2017-01-11 VITALS — BP 120/92 | HR 91 | Temp 98.0°F | Ht 64.0 in | Wt 228.5 lb

## 2017-01-11 DIAGNOSIS — Z79899 Other long term (current) drug therapy: Secondary | ICD-10-CM

## 2017-01-11 DIAGNOSIS — Z90710 Acquired absence of both cervix and uterus: Secondary | ICD-10-CM

## 2017-01-11 DIAGNOSIS — E1121 Type 2 diabetes mellitus with diabetic nephropathy: Secondary | ICD-10-CM

## 2017-01-11 DIAGNOSIS — I10 Essential (primary) hypertension: Secondary | ICD-10-CM | POA: Diagnosis not present

## 2017-01-11 DIAGNOSIS — Z7984 Long term (current) use of oral hypoglycemic drugs: Secondary | ICD-10-CM | POA: Diagnosis not present

## 2017-01-11 DIAGNOSIS — Z862 Personal history of diseases of the blood and blood-forming organs and certain disorders involving the immune mechanism: Secondary | ICD-10-CM | POA: Diagnosis not present

## 2017-01-11 DIAGNOSIS — Z8742 Personal history of other diseases of the female genital tract: Secondary | ICD-10-CM | POA: Diagnosis not present

## 2017-01-11 DIAGNOSIS — R601 Generalized edema: Secondary | ICD-10-CM

## 2017-01-11 DIAGNOSIS — R0602 Shortness of breath: Secondary | ICD-10-CM | POA: Diagnosis not present

## 2017-01-11 DIAGNOSIS — E669 Obesity, unspecified: Secondary | ICD-10-CM | POA: Diagnosis not present

## 2017-01-11 DIAGNOSIS — Z8679 Personal history of other diseases of the circulatory system: Secondary | ICD-10-CM

## 2017-01-11 DIAGNOSIS — Z5189 Encounter for other specified aftercare: Secondary | ICD-10-CM | POA: Diagnosis present

## 2017-01-11 DIAGNOSIS — E041 Nontoxic single thyroid nodule: Secondary | ICD-10-CM | POA: Diagnosis not present

## 2017-01-11 DIAGNOSIS — R0601 Orthopnea: Secondary | ICD-10-CM | POA: Diagnosis not present

## 2017-01-11 DIAGNOSIS — R600 Localized edema: Secondary | ICD-10-CM

## 2017-01-11 DIAGNOSIS — E119 Type 2 diabetes mellitus without complications: Secondary | ICD-10-CM | POA: Diagnosis not present

## 2017-01-11 DIAGNOSIS — D500 Iron deficiency anemia secondary to blood loss (chronic): Secondary | ICD-10-CM

## 2017-01-11 LAB — POCT GLYCOSYLATED HEMOGLOBIN (HGB A1C): Hemoglobin A1C: 6.5

## 2017-01-11 LAB — GLUCOSE, CAPILLARY: Glucose-Capillary: 94 mg/dL (ref 65–99)

## 2017-01-11 MED ORDER — LIRAGLUTIDE 18 MG/3ML ~~LOC~~ SOPN: 1.2000 mg | PEN_INJECTOR | Freq: Every day | SUBCUTANEOUS | 5 refills | Status: DC

## 2017-01-11 MED ORDER — CARVEDILOL 3.125 MG PO TABS: 3.1250 mg | ORAL_TABLET | Freq: Two times a day (BID) | ORAL | 5 refills | Status: DC

## 2017-01-11 MED ORDER — PEN NEEDLES 32G X 5 MM MISC: 1.0000 | Freq: Every day | 11 refills | Status: DC

## 2017-01-11 NOTE — Patient Instructions (Signed)
General Instructions: - Start Coreg (Carvedilol) 3.125 mg twice daily - Take Lasix 20 mg daily for next 14 days - Follow up in 2 weeks to recheck kidney function/electrolytes and reassess swelling - Stop Lasix if you feel dizzy/lightheaded or if swelling has significantly improved - We will schedule a fine needle biopsy for the thyroid, no need to see endocrinologist right now - Refill for pen needles made  Thank you for bringing your medicines today. This helps [place] keep you safe from mistakes.   Progress Toward Treatment Goals:  Treatment Goal 09/16/2015  Hemoglobin A1C at goal  Blood pressure at goal    Self Care Goals & Plans:  Self Care Goal 03/13/2015  Manage my medications take my medicines as prescribed; bring my medications to every visit; refill my medications on time  Monitor my health keep track of my blood glucose; bring my glucose meter and log to each visit; check my feet daily  Eat healthy foods drink diet soda or water instead of juice or soda; eat more vegetables; eat foods that are low in salt; eat baked foods instead of fried foods; eat smaller portions  Be physically active find an activity I enjoy; take a walk every day  Meeting treatment goals -    Home Blood Glucose Monitoring 09/16/2015  Check my blood sugar once a day  When to check my blood sugar before breakfast     Care Management & Community Referrals:  Referral 07/30/2014  Referrals made for care management support none needed

## 2017-01-12 NOTE — Progress Notes (Signed)
Internal Medicine Clinic Attending  Case discussed with Dr. Rivet soon after the resident saw the patient.  We reviewed the resident's history and exam and pertinent patient test results.  I agree with the assessment, diagnosis, and plan of care documented in the resident's note.  

## 2017-01-12 NOTE — Assessment & Plan Note (Signed)
BP well controlled. I added on Coreg today based on her symptoms and echocardiogram findings. Her echo findings were not impressive but it is difficult to tell if her SOB and orthopnea are related to side effects from Depakote vs very mild heart failure. Will see if her BP can tolerate Coreg. If she has any side effects or intolerance to Coreg would have low threshold to stop completely.

## 2017-01-12 NOTE — Assessment & Plan Note (Signed)
Per patient and patient's mother, they never received a phone call from Endocrinology. Will obtain new thyroid US and schedule outpatient FNA. Based on FNA results, can determine if Endo referral necessary.

## 2017-01-12 NOTE — Assessment & Plan Note (Signed)
Difficult to determine if her edema is related to Depakote vs very mild heart failure. Her echocardiogram was not very impressive with EF [id number], grade 1 diastolic dysfunction. However, she does report symptoms of SOB and orthopnea and has significant abdominal and lower extremity edema on exam. She did note mild improvement in her symptoms and edema while on a prior 10 day trial of Lasix. Difficult to know too if her symptoms of SOB are related to her obesity as she has gained significant weight since being on antipsychotics. Will have her restart Lasix 20 mg daily. She was given precautions to stop Lasix if she experiences dizziness, lightheadedness, or significant improvement in her edema. Will have her follow up in 2 weeks to reassess. She will also be stopping her Depakote this Friday (3/2) so hopefully that will help her edema as well. She was started on Coreg given her echo findings and clinical exam, but suspect this medication can be stopped if symptoms not attributed to diastolic dysfunction.

## 2017-01-12 NOTE — Assessment & Plan Note (Signed)
Anemia related to fibroids. She is s/p hysterectomy and no longer having issues with bleeding. She has stopped iron therapy.

## 2017-01-12 NOTE — Assessment & Plan Note (Signed)
A1c much improved since starting Liraglutide. A1c is at goal. Will have patient continue Metformin 1000 mg BID and Liraglutide 1.2 mg daily. Encouraged her to work on diet/exercise to lose weight. Hopefully discontinuation of her Depakote will also help with weight loss.

## 2017-01-17 ENCOUNTER — Other Ambulatory Visit: Payer: Self-pay

## 2017-01-17 DIAGNOSIS — E1121 Type 2 diabetes mellitus with diabetic nephropathy: Secondary | ICD-10-CM

## 2017-01-17 DIAGNOSIS — H905 Unspecified sensorineural hearing loss: Secondary | ICD-10-CM | POA: Insufficient documentation

## 2017-01-17 DIAGNOSIS — H918X1 Other specified hearing loss, right ear: Secondary | ICD-10-CM | POA: Diagnosis not present

## 2017-01-17 DIAGNOSIS — H90A22 Sensorineural hearing loss, unilateral, left ear, with restricted hearing on the contralateral side: Secondary | ICD-10-CM | POA: Diagnosis not present

## 2017-01-18 ENCOUNTER — Other Ambulatory Visit: Payer: Self-pay | Admitting: Internal Medicine

## 2017-01-18 DIAGNOSIS — E1121 Type 2 diabetes mellitus with diabetic nephropathy: Secondary | ICD-10-CM

## 2017-01-18 DIAGNOSIS — E0800 Diabetes mellitus due to underlying condition with hyperosmolarity without nonketotic hyperglycemic-hyperosmolar coma (NKHHC): Secondary | ICD-10-CM

## 2017-01-18 MED ORDER — ACCU-CHEK FASTCLIX LANCETS MISC: 3 refills | Status: DC

## 2017-01-18 NOTE — Telephone Encounter (Signed)
Needs refill of test strips  [facility] [place]

## 2017-01-19 MED ORDER — GLUCOSE BLOOD VI STRP
ORAL_STRIP | 3 refills | Status: DC
Start: 2017-01-19 — End: 2017-03-29

## 2017-01-25 ENCOUNTER — Ambulatory Visit: Payer: Self-pay

## 2017-01-26 ENCOUNTER — Ambulatory Visit
Admission: RE | Admit: 2017-01-26 | Discharge: 2017-01-26 | Disposition: A | Discharge status: Home / Self Care | Payer: Medicare Other | Source: Ambulatory Visit | Attending: Internal Medicine | Admitting: Internal Medicine

## 2017-01-26 DIAGNOSIS — E041 Nontoxic single thyroid nodule: Secondary | ICD-10-CM

## 2017-01-26 DIAGNOSIS — E042 Nontoxic multinodular goiter: Secondary | ICD-10-CM | POA: Diagnosis not present

## 2017-01-27 ENCOUNTER — Ambulatory Visit (INDEPENDENT_AMBULATORY_CARE_PROVIDER_SITE_OTHER): Payer: Medicare Other | Admitting: Dietician

## 2017-01-27 ENCOUNTER — Encounter: Payer: Self-pay | Admitting: Dietician

## 2017-01-27 ENCOUNTER — Telehealth: Payer: Self-pay | Admitting: Internal Medicine

## 2017-01-27 DIAGNOSIS — Z6839 Body mass index (BMI) 39.0-39.9, adult: Secondary | ICD-10-CM

## 2017-01-27 DIAGNOSIS — Z713 Dietary counseling and surveillance: Secondary | ICD-10-CM | POA: Diagnosis not present

## 2017-01-27 DIAGNOSIS — E119 Type 2 diabetes mellitus without complications: Secondary | ICD-10-CM | POA: Diagnosis not present

## 2017-01-27 DIAGNOSIS — E118 Type 2 diabetes mellitus with unspecified complications: Secondary | ICD-10-CM

## 2017-01-27 NOTE — Progress Notes (Signed)
  Medical Nutrition Therapy:  Appt start time: 8264 end time:  1115 Visit # 3-group  Assessment:  Primary concerns today: glucose and weight management.  Ms. Maj attended group diabetes and weight management medical nutrition therapy class today with her mother.  We discussed success, challenges, goals, diabetes Myths and facts using the diabetes conversation maps.  Her goal is to drink more water, eat healthy snacks and walk for 30 minutes a day.   ANTHROPOMETRICS: weight-229# decreased 3# , BMI-39, RBW- 150-160#  MEDICATIONS: victoza  BLOOD SUGAR:her A1C was 6.5% decreased from 8.7%, meter download: average 127, range 93-193, 96% in range DIETARY INTAKE: Patient reports high intake of sweets and candy, trying to stop drinking juice and eat sweets  Usual physical activity: walks 30+ minutes a day  Estimated daily energy needs:  1400-1600 calories 150-170 g carbohydrates   Progress Towards Goal(s):  Some progress.   Nutritional Diagnosis:  NI-1.5 Excessive energy intake As related to excess sweets, starches and large food portions.  As evidenced by her report and increasing weight.     Intervention:  Nutrition education about goals, increasing fiber, healthier snack options, myth and facts about diabetes.   Coordination of care: recommend her victoza be titrated to 1.8mg  daily.   Teaching Method Utilized: Visual, Auditory,Hands on Handouts given during visit include:healhty fats, goals and evaluation of goals form last month.  Barriers to learning/adherence to lifestyle change:limited finances,  mental health issues, medicines and social situation Demonstrated degree of understanding via:  Teach Back   Monitoring/Evaluation:  Dietary intake, exercise, meter, and body weight in 4 week(s). [place], RD 01/27/2017 5:54 PM.   Desiree Hane meter today:

## 2017-01-27 NOTE — Telephone Encounter (Signed)
Opened in error

## 2017-01-28 ENCOUNTER — Telehealth: Payer: Self-pay | Admitting: Internal Medicine

## 2017-01-28 NOTE — Telephone Encounter (Signed)
APT. REMINDER CALL, LMTCB °

## 2017-01-31 ENCOUNTER — Ambulatory Visit (INDEPENDENT_AMBULATORY_CARE_PROVIDER_SITE_OTHER): Payer: Medicare Other | Admitting: Internal Medicine

## 2017-01-31 VITALS — BP 126/88 | HR 78 | Temp 98.0°F | Ht 64.0 in | Wt 226.7 lb

## 2017-01-31 DIAGNOSIS — E042 Nontoxic multinodular goiter: Secondary | ICD-10-CM | POA: Diagnosis present

## 2017-01-31 DIAGNOSIS — H9190 Unspecified hearing loss, unspecified ear: Secondary | ICD-10-CM | POA: Diagnosis not present

## 2017-01-31 DIAGNOSIS — Z6838 Body mass index (BMI) 38.0-38.9, adult: Secondary | ICD-10-CM | POA: Diagnosis not present

## 2017-01-31 DIAGNOSIS — R600 Localized edema: Secondary | ICD-10-CM

## 2017-01-31 DIAGNOSIS — E041 Nontoxic single thyroid nodule: Secondary | ICD-10-CM

## 2017-01-31 DIAGNOSIS — R0602 Shortness of breath: Secondary | ICD-10-CM

## 2017-01-31 DIAGNOSIS — R601 Generalized edema: Secondary | ICD-10-CM

## 2017-01-31 NOTE — Progress Notes (Signed)
   CC: thyroid follow up and edema f/up  HPI:  [name] is a 46 y.o. with PMh as listed below is here for thyroid nodule follow up and follow up for edema.   Had leg edema with SOB and orthopnea last visit, Dr. Arcelia Jew put her on lasix 20mg  daily and stopped her depakote. 2Decho 12/28/16 showed normal EF, grade 1 DD. She still gets SOB with minimal exertion and also at rest. Swelling improved significantly with lasix and also compression socks.   For thryoid nodule, a thyroid u/s was ordered which showed 3 thyroid nodules >1 cm (2.1 cm solid, 1.5 cm solid, and 1.3 cm solid), also showed other smaller nodules. Recommended FNA biopsy for the 2.1cm inferior right and 1.5 cm inferior left nodules and 1 year f/up for 1.3 cm inferior right nodules. TSH 1 month ago 1.060.  Patient was under the impression that the visit was for FNA but it was just for the u/s. They are not happy about that and would like to get the biopsy done as soon as possible.  Wants SCAT form filled out. Will leave this in PCP box.    Past Medical History:  Diagnosis Date  . Anemia   . Congenital cerebral palsy ([place])   . Depression   . 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 Endometriosis   . Hearing impairment   . Heart murmur    at birth  . Hypertension   . Neuromuscular disorder ([place])    Cerebral palsy- very mild left side of brain  . Schizoaffective disorder ([place])     Review of Systems:   Review of Systems  Constitutional: Negative for chills and fever.  Respiratory: Positive for shortness of breath. Negative for cough and sputum production.   Cardiovascular: Negative for chest pain and palpitations.  Gastrointestinal: Negative for heartburn, nausea and vomiting.  Genitourinary: Negative for dysuria.  Neurological: Negative for dizziness and headaches.     Physical Exam:  Vitals:   01/31/17 1102  BP: 126/88  Pulse: 78  Temp: 98 F (36.7 C)  TempSrc: Oral  SpO2: 100%  Weight: 226  lb 11.2 oz (102.8 kg)  Height: 5\' 4"  (1.626 m)   Physical Exam  Constitutional: She is oriented to person, place, and time. She appears well-developed and well-nourished.  Obese female. Has difficulty with hearing. Mother is there with her.  HENT:  Head: Normocephalic and atraumatic.  Cardiovascular: Normal rate and regular rhythm.  Exam reveals no gallop and no friction rub.   No murmur heard. Respiratory: Effort normal and breath sounds normal. No respiratory distress. She has no wheezes.  Musculoskeletal:  1+ pitting edema upto shins.   Neurological: She is alert and oriented to person, place, and time. No cranial nerve deficit.  Psychiatric: She has a normal mood and affect.    Assessment & Plan:   See Encounters Tab for problem based charting.  Patient discussed with Dr. Lynnae January

## 2017-01-31 NOTE — Assessment & Plan Note (Signed)
Unclear why FNA was not done yet. I will put referral to IR for this.

## 2017-01-31 NOTE — Patient Instructions (Addendum)
Will refer you to interventional radiology for the biopsy.  Will get kidney labs today.   Continue your lasix. Follow up with Dr. Arcelia Jew in 2 months.

## 2017-01-31 NOTE — Progress Notes (Signed)
Internal Medicine Clinic Attending  Case discussed with Dr. Ahmed at the time of the visit.  We reviewed the resident's history and exam and pertinent patient test results.  I agree with the assessment, diagnosis, and plan of care documented in the resident's note. 

## 2017-01-31 NOTE — Assessment & Plan Note (Signed)
Patient had improved of leg edema with lasix and compression socks but still having SOB with exertion and also at rest. This could be multifactorial with her mild diastolic dysfunction playing a role, also could be from physical deconditioning, OSA, or OHS. She gained significant amount of weight with depakote which has been stopped. satting high in room air.   - cont to use lasix 20mg  daily. Check BMET today. - assess for improvement of her volume status and SOB if she continues to lose weight as she is off depakote now. - if fails to improve, may consider doing sleep study or right heart cath to evaluate for pulm HTN (does not have PA pressure reported on last ECHO)

## 2017-02-01 LAB — BASIC METABOLIC PANEL
BUN / CREAT RATIO: 13 (ref 9–23)
BUN: 7 mg/dL (ref 6–24)
CO2: 23 mmol/L (ref 18–29)
Calcium: 9.1 mg/dL (ref 8.7–10.2)
Chloride: 99 mmol/L (ref 96–106)
Creatinine, Ser: 0.54 mg/dL — ABNORMAL LOW (ref 0.57–1.00)
GFR calc Af Amer: 134 mL/min/{1.73_m2} (ref >59)
GFR calc non Af Amer: 116 mL/min/{1.73_m2} (ref >59)
GLUCOSE: 69 mg/dL (ref 65–99)
POTASSIUM: 3.7 mmol/L (ref 3.5–5.2)
Sodium: 141 mmol/L (ref 134–144)

## 2017-02-11 ENCOUNTER — Other Ambulatory Visit: Payer: Self-pay | Admitting: Internal Medicine

## 2017-02-11 DIAGNOSIS — E041 Nontoxic single thyroid nodule: Secondary | ICD-10-CM

## 2017-02-21 ENCOUNTER — Other Ambulatory Visit: Payer: Self-pay | Admitting: Internal Medicine

## 2017-02-21 DIAGNOSIS — E041 Nontoxic single thyroid nodule: Secondary | ICD-10-CM

## 2017-03-03 ENCOUNTER — Ambulatory Visit (INDEPENDENT_AMBULATORY_CARE_PROVIDER_SITE_OTHER): Payer: Medicare Other | Admitting: Pharmacist

## 2017-03-03 ENCOUNTER — Encounter: Payer: Self-pay | Admitting: Pharmacist

## 2017-03-03 VITALS — Wt 228.5 lb

## 2017-03-03 DIAGNOSIS — Z719 Counseling, unspecified: Secondary | ICD-10-CM

## 2017-03-03 DIAGNOSIS — Z713 Dietary counseling and surveillance: Secondary | ICD-10-CM | POA: Diagnosis present

## 2017-03-03 NOTE — Patient Instructions (Signed)
Patient educated about medication as defined in this encounter and verbalized understanding by repeating back instructions provided.   

## 2017-03-03 NOTE — Progress Notes (Signed)
Medical Nutrition Therapy: Appt start time: 1015end time: 1115. Last visit 01/03/17  Assessment: Primary concerns today: getting more active, eating smaller meals, and cutting back on soda. Education included successes, challenges,goals, participation in a [employer] discussion about monitoring blood glucose and symptoms of high and low blood glucose.   Her goal is to: eat smaller meals, eat more slowly, and continue to increase activity by walking an hour a day Follow up on previous goal: Patient was able to walk 30 minutes successfully, so she wants to push herself to do more.  SELF MONITORING-High was 174, low 91, average 125 Labs: most recent A1C is 6.5% (01/11/17) DIETARY INTAKE: Patient has been better about eating smaller meals, but she does still struggle with excess eating and drinking a lot of soda. Weight- Today: 228.5bs  Progress Towards Goal(s): Progress made.  Nutritional Diagnosis:   A1c in goal <7.  Intervention: Group Nutrition counseling and education about healthy options for easy and healthy coking/food options.  Barriers to learning/adherence to lifestyle change: hard to stop eating the foods she is used to eating  Demonstrated degree of understanding via: Teach Back  Monitoring/Evaluation: Dietary intake, exercise, meter and body weight in 4weeks for group(s)   Florinda Marker PharmD

## 2017-03-04 NOTE — Progress Notes (Signed)
reviewed

## 2017-03-08 ENCOUNTER — Other Ambulatory Visit (HOSPITAL_COMMUNITY)
Admission: RE | Admit: 2017-03-08 | Discharge: 2017-03-08 | Disposition: A | Discharge status: Home / Self Care | Payer: Medicare Other | Source: Ambulatory Visit | Attending: Radiology | Admitting: Radiology

## 2017-03-08 ENCOUNTER — Ambulatory Visit
Admission: RE | Admit: 2017-03-08 | Discharge: 2017-03-08 | Disposition: A | Discharge status: Home / Self Care | Payer: Medicare Other | Source: Ambulatory Visit | Attending: Internal Medicine | Admitting: Internal Medicine

## 2017-03-08 DIAGNOSIS — E042 Nontoxic multinodular goiter: Secondary | ICD-10-CM | POA: Diagnosis not present

## 2017-03-08 DIAGNOSIS — E041 Nontoxic single thyroid nodule: Secondary | ICD-10-CM

## 2017-03-10 ENCOUNTER — Telehealth: Payer: Self-pay | Admitting: Internal Medicine

## 2017-03-10 NOTE — Telephone Encounter (Signed)
Discussed with patient and her mother that the thyroid biopsy results were negative.

## 2017-03-29 ENCOUNTER — Encounter: Payer: Self-pay | Admitting: Internal Medicine

## 2017-03-29 ENCOUNTER — Ambulatory Visit (INDEPENDENT_AMBULATORY_CARE_PROVIDER_SITE_OTHER): Payer: Medicare Other | Admitting: Internal Medicine

## 2017-03-29 VITALS — BP 124/70 | HR 82 | Temp 98.3°F | Wt 222.6 lb

## 2017-03-29 DIAGNOSIS — Z79899 Other long term (current) drug therapy: Secondary | ICD-10-CM

## 2017-03-29 DIAGNOSIS — E119 Type 2 diabetes mellitus without complications: Secondary | ICD-10-CM

## 2017-03-29 DIAGNOSIS — Z6838 Body mass index (BMI) 38.0-38.9, adult: Secondary | ICD-10-CM

## 2017-03-29 DIAGNOSIS — R0602 Shortness of breath: Secondary | ICD-10-CM

## 2017-03-29 DIAGNOSIS — Z7984 Long term (current) use of oral hypoglycemic drugs: Secondary | ICD-10-CM

## 2017-03-29 DIAGNOSIS — E118 Type 2 diabetes mellitus with unspecified complications: Secondary | ICD-10-CM

## 2017-03-29 LAB — GLUCOSE, CAPILLARY: Glucose-Capillary: 88 mg/dL (ref 65–99)

## 2017-03-29 LAB — POCT GLYCOSYLATED HEMOGLOBIN (HGB A1C): HEMOGLOBIN A1C: 6.3

## 2017-03-29 NOTE — Assessment & Plan Note (Addendum)
Assessment She continues to take Lasix 20 mg daily and Coreg 3.125 mg twice daily. Echo done February 2018 showing left ventricular ejection fraction 50-55% and grade 1 diastolic dysfunction. No pulmonary artery pressure reported. Patient reports snoring occasionally but denies any other symptoms of OSA including headaches, fatigue, or daytime somnolence. In addition, review of labs did not show hypercarbia to suggest OSA/OHS. At present, patient denies having any shortness of breath or lower extremity edema. She appears euvolemic on exam.  Plan -Continue Lasix -Continue Coreg

## 2017-03-29 NOTE — Patient Instructions (Addendum)
Ms. Koogler it was nice meeting you today.  Continue taking Metformin and Victoza as before. You do not need to check your sugar at home. We will check it at the clinic when you return for a follow-up visit.   Continue taking Lasix and Coreg as before.  I encourage you to eat healthy and exercise.

## 2017-03-29 NOTE — Assessment & Plan Note (Signed)
Lab Results  Component Value Date   HGBA1C 6.3 03/29/2017   HGBA1C 6.5 01/11/2017   HGBA1C 8.7 11/02/2016     Assessment: Diabetes control:  well-controlled Comments: Currently on metformin 1000 mg twice daily and Victoza 1.2 mg daily. She has lost 6 pounds in the past 1 month.  Plan: Medications:  continue current medications Home glucose monitoring: Not required   Instruction/counseling given: reminded to get eye exam, reminded to bring medications to each visit, discussed foot care, discussed the need for weight loss, discussed diet and discussed sick day management

## 2017-03-29 NOTE — Progress Notes (Signed)
   CC: Patient is here to discuss diabetes. Shortness of breath and morbid obesity were also discussed during this visit.  HPI:  [name] is a 46 y.o. female with a past medical history of conditions listed below presenting to the clinic to discuss diabetes. Shortness of breath and morbid obesity were also discussed during this visit. Please see problem based charting for the status of the patient's current and chronic medical conditions.   Past Medical History:  Diagnosis Date  . Anemia   . Congenital cerebral palsy ([place])   . Depression   . 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 Endometriosis   . Hearing impairment   . Heart murmur    at birth  . Hypertension   . Neuromuscular disorder ([place])    Cerebral palsy- very mild left side of brain  . Schizoaffective disorder ([place])     Review of Systems:  Pertinent positives mentioned in HPI. Remainder of all ROS negative.   Physical Exam:  Vitals:   03/29/17 1454  BP: 124/70  Pulse: 82  Temp: 98.3 F (36.8 C)  TempSrc: Oral  SpO2: 100%  Weight: 222 lb 9.6 oz (101 kg)   Physical Exam  Constitutional: She is oriented to person, place, and time. She appears well-developed and well-nourished. No distress.  HENT:  Head: Normocephalic and atraumatic.  Eyes: Right eye exhibits no discharge. Left eye exhibits no discharge.  Neck: Neck supple. No tracheal deviation present.  Cardiovascular: Normal rate, regular rhythm and intact distal pulses.  Exam reveals no gallop and no friction rub.   Pulmonary/Chest: Effort normal and breath sounds normal. No respiratory distress. She has no wheezes. She has no rales.  Abdominal: Soft. Bowel sounds are normal. There is no tenderness.  Obese  Musculoskeletal: She exhibits no edema.  Neurological: She is alert and oriented to person, place, and time.  Skin: Skin is warm and dry.    Assessment & Plan:   See Encounters Tab for problem based charting.  Patient discussed  with Dr. Angelia Mould

## 2017-03-29 NOTE — Assessment & Plan Note (Signed)
Assessment Patient has lost 6 pounds in the past 1 month. Reports eating smaller portions, less sugar, and is walking.  Plan -Educated patient about healthy eating and exercise. Emphasized the importance of weight loss.  -Continue Victoza

## 2017-03-30 NOTE — Progress Notes (Signed)
Internal Medicine Clinic Attending  Case discussed with Dr. Rathoreat the time of the visit. We reviewed the resident's history and exam and pertinent patient test results. I agree with the assessment, diagnosis, and plan of care documented in the resident's note.  

## 2017-03-31 ENCOUNTER — Ambulatory Visit: Payer: Self-pay | Admitting: Dietician

## 2017-04-18 ENCOUNTER — Other Ambulatory Visit: Payer: Self-pay | Admitting: *Deleted

## 2017-04-26 ENCOUNTER — Encounter: Payer: Self-pay | Admitting: *Deleted

## 2017-04-29 ENCOUNTER — Other Ambulatory Visit: Payer: Self-pay | Admitting: Dietician

## 2017-04-29 DIAGNOSIS — E118 Type 2 diabetes mellitus with unspecified complications: Secondary | ICD-10-CM

## 2017-04-29 MED ORDER — GLUCOSE BLOOD VI STRP: ORAL_STRIP | 5 refills | Status: DC

## 2017-04-29 MED ORDER — ACCU-CHEK FASTCLIX LANCETS MISC: 5 refills | Status: DC

## 2017-04-29 NOTE — Telephone Encounter (Signed)
Patient requests refill on testing supplies.

## 2017-05-05 ENCOUNTER — Ambulatory Visit (INDEPENDENT_AMBULATORY_CARE_PROVIDER_SITE_OTHER): Payer: Medicare Other | Admitting: Dietician

## 2017-05-05 DIAGNOSIS — E119 Type 2 diabetes mellitus without complications: Secondary | ICD-10-CM | POA: Diagnosis not present

## 2017-05-05 DIAGNOSIS — Z794 Long term (current) use of insulin: Secondary | ICD-10-CM

## 2017-05-05 DIAGNOSIS — Z713 Dietary counseling and surveillance: Secondary | ICD-10-CM

## 2017-05-05 DIAGNOSIS — E118 Type 2 diabetes mellitus with unspecified complications: Secondary | ICD-10-CM

## 2017-05-05 NOTE — Progress Notes (Signed)
  Medical Nutrition Therapy:  Appt start time: 4944 end time:  1120 Visit # 4-group  Assessment:  Primary concerns today: glucose control and weight management.  Ms. Hada attended group diabetes and weight management medical nutrition therapy class today with her mother.  We discussed success, challenges, goals, continuing the journey of living with diabetes using the diabetes conversation map.   Her goal is to drink more water, eat small meals and walk for 15-30 minutes a day.   ANTHROPOMETRICS: weight-not done today,none new since last month  RBW- 150-160# MEDICATIONS: victoza 1.2 mg/day, metformin  1000 mg twice a day BLOOD SUGAR: A1C 6.3%, meter download: average 153 ( last time was 127), range 106-205 ( last time was 93-193) DIETARY INTAKE: Not done today Usual physical activity: walks 30+ minutes a day  Estimated daily energy needs:  1400-1600 calories 150-170 g carbohydrates   Progress Towards Goal(s):  Some progress.   Nutritional Diagnosis:  NI-1.5 Excessive energy intake As related to excess sweets, starches and large food portions is improving  As evidenced by her decreasing weight and blood sugar.     Intervention:  Nutrition education about goals, living with diabetes.   Coordination of care: recommend trial of victoza 1.8mg  daily.   Teaching Method Utilized: Visual, Auditory,Hands on Handouts given during visit include:healthy fats, goals and evaluation of goals from last month.  Barriers to learning/adherence to lifestyle change:limited finances,  mental health issues, medicines and social situation Demonstrated degree of understanding via:  Teach Back   Monitoring/Evaluation:  Dietary intake, exercise, meter, and body weight in 4 week(s). [place], RD 05/05/2017 12:59 PM.   Desiree Hane meter today:

## 2017-06-02 ENCOUNTER — Ambulatory Visit: Payer: Self-pay | Admitting: Pharmacist

## 2017-06-10 ENCOUNTER — Ambulatory Visit (INDEPENDENT_AMBULATORY_CARE_PROVIDER_SITE_OTHER): Payer: Medicare Other | Admitting: Internal Medicine

## 2017-06-10 ENCOUNTER — Encounter: Payer: Self-pay | Admitting: Internal Medicine

## 2017-06-10 VITALS — BP 130/85 | HR 82 | Temp 98.2°F | Ht 64.0 in | Wt 222.1 lb

## 2017-06-10 DIAGNOSIS — R609 Edema, unspecified: Secondary | ICD-10-CM | POA: Diagnosis not present

## 2017-06-10 DIAGNOSIS — E118 Type 2 diabetes mellitus with unspecified complications: Secondary | ICD-10-CM

## 2017-06-10 DIAGNOSIS — M940 Chondrocostal junction syndrome [Tietze]: Secondary | ICD-10-CM

## 2017-06-10 DIAGNOSIS — R0683 Snoring: Secondary | ICD-10-CM | POA: Insufficient documentation

## 2017-06-10 DIAGNOSIS — G4733 Obstructive sleep apnea (adult) (pediatric): Secondary | ICD-10-CM | POA: Insufficient documentation

## 2017-06-10 HISTORY — DX: Edema, unspecified: R60.9

## 2017-06-10 LAB — GLUCOSE, CAPILLARY: GLUCOSE-CAPILLARY: 121 mg/dL — AB (ref 65–99)

## 2017-06-10 MED ORDER — FUROSEMIDE 20 MG PO TABS: 20.0000 mg | ORAL_TABLET | Freq: Every day | ORAL | 0 refills | Status: DC | PRN

## 2017-06-10 NOTE — Patient Instructions (Addendum)
It was nice to meet today [name].   You're doing a great job with your diabetes, keep up the good work. You don't have to check her blood sugars at home if you don't want to. We can check them here and we'll recheck your A1c at your next appointment. Keep trying to eat healthy, drink water instead of other drinks, and exercise.  We put in to have a sleep study set up for you to get more information about her snoring and if that is affecting her breathing while asleep.  The cartilage of your chest is a little inflamed, try to avoid carrying a bag on one shoulder. He can also take ibuprofen 600 mg when needed when it is bothering you.

## 2017-06-10 NOTE — Assessment & Plan Note (Addendum)
She previously experienced shortness of breath and lower extremity edema. Echo findings in February 2018 showed normal ejection fraction with grade 1 diastolic dysfunction. Her psychiatrist noted it could be a side effect of Depakote which she was on at the time. She was started on Lasix 20 mg daily and Coreg 3.125 mg twice a day for symptomatic relief. Her labs have been stable on this regimen when previously checked. Today ###

## 2017-06-10 NOTE — Assessment & Plan Note (Addendum)
She is currently well controlled on her regimen of metformin 100 mg twice a day and liraglutide 1.2 mg daily. Her last A1c in May was 6.3. She has continued to attempt to implement lifestyle changes. Her weight is stable compared to her last visit. Her glucometer readings are all within target range. Frequent glucose checks at home are unnecessary in this case and she was advised that she can discontinue and have her sugar checked in the clinic. Given consecutive A1c values at goal, will wait an additional 3 months to recheck A1c.   -Continue current regimen  -Encouraged continued lifestyle modifications

## 2017-06-10 NOTE — Progress Notes (Signed)
   CC: Routine f/u of chronic medical conditions  HPI:  [name] is a 46 y.o. female with past medical history as described below who presents to the clinic for routine follow-up.  DM: She is continuing to try to eat healthier and have smaller portions with meals, she is also trying to drink more water compared to sweet drinks such as tea. She is taking her diabetes regimen as prescribed and denies any symptoms of hypoglycemic events.  LE Edema: She reports improvement in her previous lower extremity edema. She takes 20 mg of Lasix as needed and the edema also improves with lying down.  Snoring: Her mother reports heavy snoring at night and is worried there are periods in which she momentarily stops breathing. The patient endorses occasional daytime fatigue.   Schizoaffective Disorder: She reports her mood is stable and continues to see her psychiatrist.  Rib Pain: She reports pain near her breast bone starting today. It is not exertional related, no associated nausea or diaphoresis. She carries her purse over one shoulder.  Past Medical History:  Diagnosis Date  . Anemia   . Congenital cerebral palsy ([place])   . Depression   . Diabetes mellitus due to abnormal insulin ([place])   . Endometriosis   . Hearing impairment   . Heart murmur    at birth  . Hypertension   . Neuromuscular disorder ([place])    Cerebral palsy- very mild left side of brain  . Schizoaffective disorder ([place])    Review of Systems:  Review of Systems  Constitutional: Negative for fever and weight loss.  Respiratory: Negative for cough and shortness of breath.   Cardiovascular: Positive for leg swelling.     Physical Exam:  Vitals:   06/10/17 1521  BP: 130/85  Pulse: 82  Temp: 98.2 F (36.8 C)  TempSrc: Oral  SpO2: 100%  Weight: 222 lb 1.6 oz (100.7 kg)  Height: 5\' 4"  (1.626 m)   Physical Exam  Constitutional: She appears well-developed and well-nourished.  Cardiovascular: Normal rate, regular  rhythm, normal heart sounds and intact distal pulses.   Pulmonary/Chest: Effort normal and breath sounds normal. No respiratory distress.  Abdominal: Soft. She exhibits no distension. There is no tenderness.  Musculoskeletal:  Tenderness to palpation to left costosternal junction. Trace lower extremity edema  Skin: Skin is warm and dry. Capillary refill takes less than 2 seconds.    Assessment & Plan:   See Encounters Tab for problem based charting.  Patient seen with Dr. Evette Doffing

## 2017-06-10 NOTE — Assessment & Plan Note (Addendum)
Notes non exertional and reproducible pain at her costal cartilage. She also carries her bag on one arm. Low concern that this represents angina and will encourage NSAID use for costochondritis and close monitoring.

## 2017-06-10 NOTE — Assessment & Plan Note (Signed)
Her mother reports the patient has regular, heavy snoring and episodes that concerned her for apnea. The patient notes some daytime fatigue, and she also has a neck and overall body habitus that is associated with obstructive sleep apnea. They are interested in undergoing a sleep study for further evaluation.  -Referral for sleep study

## 2017-06-10 NOTE — Assessment & Plan Note (Addendum)
The patient has had interval improvement in her lower extremity edema symptoms. She experiences this edema occasionally and uses 20 mg Lasix as needed with relief. It also improves with lying down and elevating her feet. She is also taking carvedilol 3.125 mg twice a day. She is tolerating this well and we will continue today.  -Continue lasix 20 mg prn, coreg 3.125 mg bid

## 2017-06-13 NOTE — Progress Notes (Signed)
Internal Medicine Clinic Attending  I saw and evaluated the patient.  I personally confirmed the key portions of the history and exam documented by Dr. Harden and I reviewed pertinent patient test results.  The assessment, diagnosis, and plan were formulated together and I agree with the documentation in the resident's note.  

## 2017-06-13 NOTE — Addendum Note (Signed)
Addended by: Lalla Brothers T on: 06/13/2017 08:37 AM   Modules accepted: Level of Service

## 2017-06-30 ENCOUNTER — Ambulatory Visit (INDEPENDENT_AMBULATORY_CARE_PROVIDER_SITE_OTHER): Payer: Medicare Other | Admitting: Dietician

## 2017-06-30 DIAGNOSIS — Z713 Dietary counseling and surveillance: Secondary | ICD-10-CM | POA: Diagnosis not present

## 2017-06-30 DIAGNOSIS — E119 Type 2 diabetes mellitus without complications: Secondary | ICD-10-CM

## 2017-06-30 DIAGNOSIS — Z7984 Long term (current) use of oral hypoglycemic drugs: Secondary | ICD-10-CM | POA: Diagnosis not present

## 2017-06-30 DIAGNOSIS — E118 Type 2 diabetes mellitus with unspecified complications: Secondary | ICD-10-CM

## 2017-06-30 NOTE — Progress Notes (Signed)
  Medical Nutrition Therapy:  Appt start time: 7116 end time:  1115 Visit # 5-group  Assessment:  Primary concerns today: glucose control and weight management.  [name] attended group diabetes and weight management medical nutrition therapy class today with her mother.  We had a food demo, discussed success, challenges, goals, Monitoring your blood sugars using the diabetes conversation map.  She'd like to decrease the size of her abdomen. Her goal to do that is to drink more water, eat small meals and walk for 15-30 minutes a day.   ANTHROPOMETRICS: weight-not done today,stable RBW- 150-160# MEDICATIONS: victoza 1.2 mg/day, metformin  1000 mg twice a day BLOOD SUGAR: A1C 6.3%, meter download: average 100 (improved), range 89-135 (improved) DIETARY INTAKE: Not done today Usual physical activity: walks 30+ minutes a day  Estimated daily energy needs:  1400-1600 calories 150-170 g carbohydrates   Progress Towards Goal(s):  Some progress.   Nutritional Diagnosis:  NI-1.5 Excessive energy intake As related to excess sweets, starches and large food portions is improving  As evidenced by her decreasing weight and blood sugar.     Intervention:  Nutrition education about goals, monitoring and symptoms of diabetes.   Coordination of care: recommend trial of victoza 1.8mg  daily.   Teaching Method Utilized: Visual, Auditory,Hands on Handouts given during visit include:healthy fats, goals and evaluation of goals from last month.  Barriers to learning/adherence to lifestyle change:limited finances,  mental health issues, medicines and social situation Demonstrated degree of understanding via:  Teach Back   Monitoring/Evaluation:  Dietary intake, exercise, meter, and body weight in 4 week(s). [place], RD 06/30/2017 3:38 PM.   Desiree Hane meter today:

## 2017-06-30 NOTE — Patient Instructions (Addendum)
[name],  Your blood sugars are better than they were on your last visit. You must be doing something to make a difference!  Keep up the walking and eating healthy foods like:  2 fruits a day 3 vegetable servings a day Chicken, fish, [place], beans and nuts 2 Diary servings a day Whole grain and real food starchy food like sweet potatoes,  Whole grain cereal, crackers and bread Drinking water, diet drinks, unsweet tea, seltzer water  See you on September 20 at 10:15 AM.

## 2017-07-01 NOTE — Addendum Note (Signed)
Addended by: Tawny Asal A on: 07/01/2017 05:13 PM   Modules accepted: Orders

## 2017-07-14 ENCOUNTER — Other Ambulatory Visit: Payer: Self-pay | Admitting: *Deleted

## 2017-07-14 DIAGNOSIS — R601 Generalized edema: Secondary | ICD-10-CM

## 2017-07-14 DIAGNOSIS — E0800 Diabetes mellitus due to underlying condition with hyperosmolarity without nonketotic hyperglycemic-hyperosmolar coma (NKHHC): Secondary | ICD-10-CM

## 2017-07-14 MED ORDER — CARVEDILOL 3.125 MG PO TABS: 3.1250 mg | ORAL_TABLET | Freq: Two times a day (BID) | ORAL | 3 refills | Status: DC

## 2017-07-14 MED ORDER — METFORMIN HCL 1000 MG PO TABS: 1000.0000 mg | ORAL_TABLET | Freq: Two times a day (BID) | ORAL | 3 refills | Status: DC

## 2017-07-19 ENCOUNTER — Ambulatory Visit (HOSPITAL_BASED_OUTPATIENT_CLINIC_OR_DEPARTMENT_OTHER)
Payer: Medicare Other | Attending: Student in an Organized Health Care Education/Training Program | Admitting: Internal Medicine

## 2017-07-19 DIAGNOSIS — G4733 Obstructive sleep apnea (adult) (pediatric): Secondary | ICD-10-CM | POA: Diagnosis not present

## 2017-07-19 DIAGNOSIS — R0683 Snoring: Secondary | ICD-10-CM | POA: Diagnosis not present

## 2017-07-24 DIAGNOSIS — R0683 Snoring: Secondary | ICD-10-CM

## 2017-07-24 NOTE — Procedures (Signed)
   Patient Name: [name], [name] Date: 07/19/2017 Gender: Female D.O.B: [birth year] Age (years): 46 Referring Provider: Axel Filler Height (inches): 16 Interpreting Physician: Baird Lyons MD, ABSM Weight (lbs): 222.40 RPSGT: Laren Everts BMI: 38 MRN: [id number] Neck Size: 17.50 CLINICAL INFORMATION Sleep Study Type: NPSG  Indication for sleep study: Daytime Fatigue, Diabetes, Fatigue, Hypertension, Morbid Obesity, Morning Headaches, Non-refreshing Sleep, Parasomnias, Snoring, Witnesses Apnea / Gasping During Sleep  Epworth Sleepiness Score: 20  SLEEP STUDY TECHNIQUE As per the AASM Manual for the Scoring of Sleep and Associated Events v2.3 (April 2016) with a hypopnea requiring 4% desaturations.  The channels recorded and monitored were frontal, central and occipital EEG, electrooculogram (EOG), submentalis EMG (chin), nasal and oral airflow, thoracic and abdominal wall motion, anterior tibialis EMG, snore microphone, electrocardiogram, and pulse oximetry.  MEDICATIONS Medications self-administered by patient taken the night of the study : METFORMIN, CARVEDILOL, [place] The study was initiated at 9:46:50 PM and ended at 4:58:42 AM.  Sleep onset time was 68.6 minutes and the sleep efficiency was 68.0%. The total sleep time was 293.8 minutes.  Stage REM latency was 251.0 minutes.  The patient spent 6.81% of the night in stage N1 sleep, 80.09% in stage N2 sleep, 0.00% in stage N3 and 13.11% in REM.  Alpha intrusion was absent.  Supine sleep was 31.32%.  RESPIRATORY PARAMETERS The overall apnea/hypopnea index (AHI) was 8.0 per hour. There were 6 total apneas, including 6 obstructive, 0 central and 0 mixed apneas. There were 33 hypopneas and 11 RERAs.  The AHI during Stage REM sleep was 59.2 per hour.  AHI while supine was 24.8 per hour.  The mean oxygen saturation was 97.48%. The minimum SpO2 during sleep was 76.00%.  Loud  snoring was noted during this study.  CARDIAC DATA The 2 lead EKG demonstrated sinus rhythm. The mean heart rate was 79.64 beats per minute. Other EKG findings include: None.  LEG MOVEMENT DATA The total PLMS were 0 with a resulting PLMS index of 0.00. Associated arousal with leg movement index was 0.0 .  IMPRESSIONS - Mild obstructive sleep apnea occurred during this study (AHI = 8.0/h). - No significant central sleep apnea occurred during this study (CAI = 0.0/h). - Moderate oxygen desaturation was noted during this study (Min O2 = 76.00%, Mean 97.5%). - The patient snored with Loud snoring volume. - No cardiac abnormalities were noted during this study. - Clinically significant periodic limb movements did not occur during sleep. No significant associated arousals.  DIAGNOSIS - Obstructive Sleep Apnea (327.23 [G47.33 ICD-10])  RECOMMENDATIONS - Therapy for mild OSA is directed at symptoms. Given loud snoring and complaint of excessive daytime sleepiness, CPAP titration, a fitted oral appliance, or chin strap might be successful. - Be careful with alcohol, sedatives and other CNS depressants that may worsen sleep apnea and disrupt normal sleep architecture. - Sleep hygiene should be reviewed to assess factors that may improve sleep quality. - Weight management and regular exercise should be initiated or continued if appropriate.  [Electronically signed] 07/24/2017 04:25 PM  Baird Lyons MD, [place], American Board of Sleep Medicine   NPI: [id number]  [place], American Board of Sleep Medicine  ELECTRONICALLY SIGNED ON:  07/24/2017, 4:20 PM [place] PH: [phone]   FX: [phone] [place]

## 2017-08-04 ENCOUNTER — Ambulatory Visit: Payer: Medicare Other | Admitting: Dietician

## 2017-08-04 NOTE — Progress Notes (Signed)
Patient attended diabetes education group visit today for 60 minutes. Hyperglycemia and hypoglycemia were discussed.  Blood sugars: meter download shows 7 test in past 30 days, average is 135, range is 110-149, 100% in target range Weight- not done today Plan: patient plans to attend group diabetes meeting in 1 month. [name], Butch Penny, RD 08/04/2017 1:16 PM.

## 2017-08-04 NOTE — Patient Instructions (Signed)
Thank you for coming to diabetes group meeting today.  We discussed high and low blood sugars.   We look forward to seeing you again next month on September 01, 2017  Butch Penny [phone]

## 2017-08-30 DIAGNOSIS — F209 Schizophrenia, unspecified: Secondary | ICD-10-CM | POA: Diagnosis not present

## 2017-09-01 ENCOUNTER — Other Ambulatory Visit: Payer: Self-pay | Admitting: Internal Medicine

## 2017-09-01 ENCOUNTER — Ambulatory Visit: Payer: Self-pay | Admitting: Dietician

## 2017-09-01 DIAGNOSIS — Z1231 Encounter for screening mammogram for malignant neoplasm of breast: Secondary | ICD-10-CM

## 2017-09-22 ENCOUNTER — Ambulatory Visit
Admission: RE | Admit: 2017-09-22 | Discharge: 2017-09-22 | Disposition: A | Discharge status: Home / Self Care | Payer: Medicare Other | Source: Ambulatory Visit | Attending: Internal Medicine | Admitting: Internal Medicine

## 2017-09-22 DIAGNOSIS — Z1231 Encounter for screening mammogram for malignant neoplasm of breast: Secondary | ICD-10-CM

## 2017-11-02 ENCOUNTER — Ambulatory Visit: Payer: Self-pay

## 2017-11-03 ENCOUNTER — Ambulatory Visit: Payer: Self-pay | Admitting: Dietician

## 2017-11-11 ENCOUNTER — Ambulatory Visit: Payer: Self-pay

## 2017-11-18 ENCOUNTER — Ambulatory Visit: Payer: Self-pay

## 2017-12-01 ENCOUNTER — Ambulatory Visit: Payer: Self-pay | Admitting: Dietician

## 2017-12-02 ENCOUNTER — Ambulatory Visit (INDEPENDENT_AMBULATORY_CARE_PROVIDER_SITE_OTHER): Payer: Medicare Other | Admitting: Internal Medicine

## 2017-12-02 ENCOUNTER — Encounter: Payer: Self-pay | Admitting: Internal Medicine

## 2017-12-02 VITALS — BP 130/82 | HR 74 | Temp 98.3°F | Ht 64.0 in | Wt 223.5 lb

## 2017-12-02 DIAGNOSIS — R350 Frequency of micturition: Secondary | ICD-10-CM | POA: Diagnosis not present

## 2017-12-02 DIAGNOSIS — Z794 Long term (current) use of insulin: Secondary | ICD-10-CM | POA: Diagnosis not present

## 2017-12-02 DIAGNOSIS — E119 Type 2 diabetes mellitus without complications: Secondary | ICD-10-CM

## 2017-12-02 DIAGNOSIS — E118 Type 2 diabetes mellitus with unspecified complications: Secondary | ICD-10-CM

## 2017-12-02 DIAGNOSIS — Z79899 Other long term (current) drug therapy: Secondary | ICD-10-CM

## 2017-12-02 DIAGNOSIS — Z8679 Personal history of other diseases of the circulatory system: Secondary | ICD-10-CM

## 2017-12-02 DIAGNOSIS — I10 Essential (primary) hypertension: Secondary | ICD-10-CM | POA: Diagnosis present

## 2017-12-02 DIAGNOSIS — G4733 Obstructive sleep apnea (adult) (pediatric): Secondary | ICD-10-CM

## 2017-12-02 DIAGNOSIS — R32 Unspecified urinary incontinence: Secondary | ICD-10-CM

## 2017-12-02 DIAGNOSIS — R3915 Urgency of urination: Secondary | ICD-10-CM

## 2017-12-02 LAB — POCT URINALYSIS DIPSTICK
Bilirubin, UA: NEGATIVE
Glucose, UA: NEGATIVE
Ketones, UA: NEGATIVE
Nitrite, UA: NEGATIVE
PROTEIN UA: NEGATIVE
Spec Grav, UA: 1.02 (ref 1.010–1.025)
UROBILINOGEN UA: 0.2 U/dL
pH, UA: 7 (ref 5.0–8.0)

## 2017-12-02 LAB — POCT GLYCOSYLATED HEMOGLOBIN (HGB A1C): Hemoglobin A1C: 7

## 2017-12-02 LAB — GLUCOSE, CAPILLARY: Glucose-Capillary: 92 mg/dL (ref 65–99)

## 2017-12-02 MED ORDER — NITROFURANTOIN MONOHYD MACRO 100 MG PO CAPS: 100.0000 mg | ORAL_CAPSULE | Freq: Two times a day (BID) | ORAL | 0 refills | Status: AC

## 2017-12-02 NOTE — Patient Instructions (Signed)
FOLLOW-UP INSTRUCTIONS When: 6 months  For: regular follow up for Diabetes, hypertension What to bring: Medications  Continue your medications as they are.   Start taking macrobid 100mg  twice daily for your urinary tract infection; let us know if your symptoms continue or you develop fevers, chills, blood in urine, nausea.  You will be called about an appointment to fit you for a CPAP.

## 2017-12-02 NOTE — Progress Notes (Signed)
   CC: hypertension  HPI:  [name] is a 47 y.o. with a PMH of HTN, OSA, T2DM, presenting to clinic for f/u on HTN, OSA, and T2DM.  HTN Chest pain: Patient on coreg 3.125mg  BID. She states the last couple of days she has checked her BP she has had readings of SBP ranging from 120's to 150's. She endorses sharp chest pain episodes over the last two weeks with no associated shortness of breath, diaphoresis, N/V, HA, vision or hearing changes. Chest pain is sharp and radiates from upper mid chest to right and left shoulders; occurs at rest and with exertion; she denies specific triggers or relieving factors.   T2DM: Patient endorses compliance with metformin 1000mg  BID and liraglutide 1.2mg  daily. A1c May 2018 was 6.3. She denies polyuria, polydipsia, n/v, blurry vision.  OSA: Patient sent for sleep study to evaluate for sleep apnea due to increased snoring noted by patient. Sleep study was consistent with mild obstructive sleep apnea, mod nocturnal O2 desaturation, and no evidence of central sleep apnea. She has not been evaluated for CPAP titration yet.   Urinary leakage: Patient endorses 2 wk history of urinary leakage, urgency and frequency; she denies dysuria, hematuria, suprapubic pressure, flank pain, fevers, N/V, vaginal discharge or discomfort. She denies prior UTIs. She had an attempted vaginal delivery that had to be converted to a C section.   Please see problem based Assessment and Plan for status of patients chronic conditions.  Past Medical History:  Diagnosis Date  . Anemia   . Congenital cerebral palsy ([place])   . Depression   . Diabetes mellitus due to abnormal insulin ([place])   . Endometriosis   . Hearing impairment   . Heart murmur    at birth  . Hypertension   . Neuromuscular disorder ([place])    Cerebral palsy- very mild left side of brain  . Schizoaffective disorder ([place])     Review of Systems:   ROS Per HPI  Physical Exam:  Vitals:   12/02/17 1119    BP: 130/82  Pulse: 74  Temp: 98.3 F (36.8 C)  TempSrc: Oral  SpO2: 100%  Weight: 223 lb 8 oz (101.4 kg)  Height: 5\' 4"  (1.626 m)   GENERAL- alert, co-operative, appears as stated age, not in any distress. CARDIAC- RRR, no murmurs, rubs or gallops. Reproducible chest pain in right anterior chest RESP- Moving equal volumes of air, and clear to auscultation bilaterally, no wheezes or crackles. ABDOMEN- Soft, nontender, bowel sounds present, no CVA tenderness.[name] EXTREMITIES- pulse 2+ PT/DP, symmetric, no LE edema. SKIN- Warm, dry.[name] PSYCH- Normal mood and affect, appropriate thought content and speech.  Assessment & Plan:   See Encounters Tab for problem based charting.   Patient discussed with Dr. Angelia Mould   Alphonzo Grieve, MD Internal Medicine PGY2

## 2017-12-03 ENCOUNTER — Encounter: Payer: Self-pay | Admitting: Internal Medicine

## 2017-12-03 NOTE — Assessment & Plan Note (Signed)
Patient endorses 2 wk history of urinary leakage, urgency and frequency; she denies dysuria, hematuria, suprapubic pressure, flank pain, fevers, N/V, vaginal discharge or discomfort. She denies prior UTIs. She had an attempted vaginal delivery that had to be converted to a C section.   Dipstick UA with large Leuks and trace blood consistent with UTI.  Plan: --macrobid 100mg  BID x5d --pt to rtc if symptoms persist or she develops fever, n/v --other causes of urge incontinence could be playing a factor such as her cogentin and anti-psychotic, or weakened pelvic floor muscles

## 2017-12-03 NOTE — Assessment & Plan Note (Signed)
Patient sent for sleep study to evaluate for sleep apnea due to increased snoring noted by patient. Sleep study was consistent with mild obstructive sleep apnea, mod nocturnal O2 desaturation, and no evidence of central sleep apnea. She has not been evaluated for CPAP titration yet.  Plan: --patient in agreement for CPAP titration; her mother uses a CPAP and she is familiar with it --referral placed for CPAP titration

## 2017-12-03 NOTE — Assessment & Plan Note (Signed)
Patient on coreg 3.125mg  BID. She states the last couple of days she has checked her BP she has had readings of SBP ranging from 120's to 150's. She endorses sharp chest pain episodes over the last two weeks with no associated shortness of breath, diaphoresis, N/V, HA, vision or hearing changes. Chest pain is sharp and radiates from upper mid chest to right and left shoulders; occurs at rest and with exertion; she denies specific triggers or relieving factors.  On exam, chest pain is reproducible on palpation of right anterior chest. Chest pain is noncardiac by her symptoms; she does have risk factors for CAD, however this presentation does not correlate with cardiac chest pain.  BP well controlled today.  Plan: --continue coreg 3.125mg  BID --patient to keep home BP log and bring in to f/u visits

## 2017-12-03 NOTE — Assessment & Plan Note (Addendum)
Patient endorses compliance with metformin 1000mg  BID and liraglutide 1.2mg  daily. A1c May 2018 was 6.3. She denies polyuria, polydipsia, n/v, blurry vision.  Plan: --A1c today 7.0 --continue metformin 1000mg  BID and liraglutide 1.2mg  daily --we discussed dietary changes to ensure A1c does not continue to increase --foot exam performed today

## 2017-12-07 NOTE — Progress Notes (Signed)
Internal Medicine Clinic Attending  Case discussed with Dr. [place]ina  at the time of the visit.  We reviewed the resident's history and exam and pertinent patient test results.  I agree with the assessment, diagnosis, and plan of care documented in the resident's note.  

## 2018-01-05 ENCOUNTER — Other Ambulatory Visit: Payer: Self-pay | Admitting: *Deleted

## 2018-01-05 DIAGNOSIS — E118 Type 2 diabetes mellitus with unspecified complications: Secondary | ICD-10-CM

## 2018-01-05 MED ORDER — GLUCOSE BLOOD VI STRP: ORAL_STRIP | 5 refills | Status: DC

## 2018-01-25 ENCOUNTER — Ambulatory Visit (INDEPENDENT_AMBULATORY_CARE_PROVIDER_SITE_OTHER): Payer: Medicare Other | Admitting: Internal Medicine

## 2018-01-25 ENCOUNTER — Other Ambulatory Visit: Payer: Self-pay

## 2018-01-25 VITALS — BP 135/84 | HR 83 | Temp 98.2°F | Ht 64.0 in | Wt 221.2 lb

## 2018-01-25 DIAGNOSIS — H9042 Sensorineural hearing loss, unilateral, left ear, with unrestricted hearing on the contralateral side: Secondary | ICD-10-CM

## 2018-01-25 DIAGNOSIS — H9202 Otalgia, left ear: Secondary | ICD-10-CM | POA: Diagnosis present

## 2018-01-25 DIAGNOSIS — J309 Allergic rhinitis, unspecified: Secondary | ICD-10-CM | POA: Insufficient documentation

## 2018-01-25 MED ORDER — FLUTICASONE PROPIONATE 50 MCG/ACT NA SUSP: 1.0000 | Freq: Every day | NASAL | 0 refills | Status: DC

## 2018-01-25 MED ORDER — CETIRIZINE HCL 10 MG PO TABS: 10.0000 mg | ORAL_TABLET | Freq: Every day | ORAL | 2 refills | Status: DC

## 2018-01-25 NOTE — Patient Instructions (Signed)
Thank you for allowing [place] to provide your care. I have sent out your prescriptions to your pharmacy.

## 2018-01-25 NOTE — Assessment & Plan Note (Signed)
Patient endorses intermittent left ear fullness/pain. She also endorses symptoms consistent with allergic rhinitis. On physical exam today her left tympanic membrane is clear with good light reflex and without retraction, bulging, or fluid. At this point I do not see any indications of infection or seroma otitis media. I feel that decongestants and antihistamines would be the best treatment for this patient. I have suggested that she start taking a daily antihistamine and using Flonase to help with her allergic rhinitis symptoms. She will follow-up if she begins to have fevers, chills.  Plan: - START a daily antihistamine and fluticasone

## 2018-01-25 NOTE — Progress Notes (Signed)
   CC: Left ear pain   HPI:  [name] is a 47 y.o. 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 She states that she went to have adjustments to her hearing aid and the doctor noticed fluid behind her eardrum and recommended that she come for further evaluation. She states that for the past month she is been having on and off pressure/fullness in the left ear. She has had increased rhinorrhea, sneezing, and eye itchiness. She feels that her allergies are uncontrolled. She denies fevers, chills, nausea/vomiting. She did have multiple ear infections when she was little and does not have any allergies to penicillins.  Past Medical History:  Diagnosis Date  . Anemia   . Congenital cerebral palsy ([place])   . Depression   . Diabetes mellitus due to abnormal insulin ([place])   . Endometriosis   . Hearing impairment   . Heart murmur    at birth  . Hypertension   . Neuromuscular disorder ([place])    Cerebral palsy- very mild left side of brain  . Schizoaffective disorder ([place])    Review of Systems:   All negative aside from those mentioned in the HPI  Physical Exam: Vitals:   01/25/18 0950  BP: 135/84  Pulse: 83  Temp: 98.2 F (36.8 C)  TempSrc: Oral  SpO2: 100%  Weight: 221 lb 3.2 oz (100.3 kg)  Height: 5\' 4"  (1.626 m)   General: Well nourished female in no acute distress HENT: Right TM is clear without retraction, bulging, or fluid. No erythema in the auditory canal. Left TM is clear with good light reflex without retraction, bulging, or fluid. No erythema in the auditory canal. Pulm: Good air movement with no wheezing or crackles  CV: RRR, no murmurs, no rubs   Assessment & Plan:   See Encounters Tab for problem based charting.  Patient discussed with Dr. Eppie Gibson

## 2018-01-26 NOTE — Progress Notes (Signed)
Case discussed with Dr. Helberg at the time of the visit.  We reviewed the resident's history and exam and pertinent patient test results.  I agree with the assessment, diagnosis and plan of care documented in the resident's note. 

## 2018-02-02 ENCOUNTER — Other Ambulatory Visit: Payer: Self-pay | Admitting: Internal Medicine

## 2018-02-02 ENCOUNTER — Ambulatory Visit: Payer: Self-pay | Admitting: Dietician

## 2018-02-02 NOTE — Telephone Encounter (Signed)
Rtc, spoke w/ pt's mother, she will have pt check w/ pharm

## 2018-02-02 NOTE — Telephone Encounter (Signed)
Patient is requesting refill on test strips

## 2018-02-06 ENCOUNTER — Other Ambulatory Visit: Payer: Self-pay | Admitting: Internal Medicine

## 2018-02-06 DIAGNOSIS — E1121 Type 2 diabetes mellitus with diabetic nephropathy: Secondary | ICD-10-CM

## 2018-03-21 ENCOUNTER — Ambulatory Visit (INDEPENDENT_AMBULATORY_CARE_PROVIDER_SITE_OTHER): Payer: Medicare Other | Admitting: Internal Medicine

## 2018-03-21 ENCOUNTER — Other Ambulatory Visit: Payer: Self-pay

## 2018-03-21 ENCOUNTER — Encounter: Payer: Self-pay | Admitting: Internal Medicine

## 2018-03-21 VITALS — BP 130/72 | HR 84 | Temp 98.0°F | Ht 64.0 in | Wt 215.9 lb

## 2018-03-21 DIAGNOSIS — E1165 Type 2 diabetes mellitus with hyperglycemia: Secondary | ICD-10-CM | POA: Diagnosis not present

## 2018-03-21 DIAGNOSIS — F259 Schizoaffective disorder, unspecified: Secondary | ICD-10-CM

## 2018-03-21 DIAGNOSIS — E118 Type 2 diabetes mellitus with unspecified complications: Secondary | ICD-10-CM

## 2018-03-21 DIAGNOSIS — G4733 Obstructive sleep apnea (adult) (pediatric): Secondary | ICD-10-CM

## 2018-03-21 DIAGNOSIS — Z0000 Encounter for general adult medical examination without abnormal findings: Secondary | ICD-10-CM

## 2018-03-21 DIAGNOSIS — Z7984 Long term (current) use of oral hypoglycemic drugs: Secondary | ICD-10-CM

## 2018-03-21 DIAGNOSIS — J309 Allergic rhinitis, unspecified: Secondary | ICD-10-CM

## 2018-03-21 DIAGNOSIS — E119 Type 2 diabetes mellitus without complications: Secondary | ICD-10-CM | POA: Diagnosis not present

## 2018-03-21 DIAGNOSIS — Z79899 Other long term (current) drug therapy: Secondary | ICD-10-CM

## 2018-03-21 DIAGNOSIS — E1121 Type 2 diabetes mellitus with diabetic nephropathy: Secondary | ICD-10-CM

## 2018-03-21 DIAGNOSIS — I10 Essential (primary) hypertension: Secondary | ICD-10-CM | POA: Insufficient documentation

## 2018-03-21 LAB — POCT GLYCOSYLATED HEMOGLOBIN (HGB A1C): Hemoglobin A1C: 6.7

## 2018-03-21 LAB — GLUCOSE, CAPILLARY: GLUCOSE-CAPILLARY: 134 mg/dL — AB (ref 65–99)

## 2018-03-21 MED ORDER — LIRAGLUTIDE 18 MG/3ML ~~LOC~~ SOPN: 1.2000 mg | PEN_INJECTOR | Freq: Every day | SUBCUTANEOUS | 3 refills | Status: DC

## 2018-03-21 MED ORDER — ATORVASTATIN CALCIUM 20 MG PO TABS: 20.0000 mg | ORAL_TABLET | Freq: Every day | ORAL | 3 refills | Status: DC

## 2018-03-21 MED ORDER — ACCU-CHEK FASTCLIX LANCETS MISC: 5 refills | Status: DC

## 2018-03-21 NOTE — Progress Notes (Signed)
   CC: Diabetes follow up   HPI:  [name] is a 47 y.o. female with a past medical history of diabetes, OSA, hypertension, schizoaffective disorder, allergic rhinitis who presents to the clinic for follow-up of chronic medical conditions.  DM: Currently on metformin and Victoza, notes that she will need a refill of Victoza.  States that she eats baked chicken and also eats noodles fairly frequently.  Allergic Rhinitis: Last month she notes an isolated incidence of dyspnea on exertion while taking out the trash and a cough.  She states this lasted 5 minutes and felt similar to her allergies.  She took Zyrtec at the time which she felt helped.  Her symptoms have not recurred and she has remained on cetirizine and Flonase regularly.  OSA: She is awaiting CPAP titration study.  HTN: Currently on carvedilol, reports adherence with no perceived side effects.  Past Medical History:  Diagnosis Date  . Anemia   . Congenital cerebral palsy ([place])   . Depression   . Diabetes mellitus due to abnormal insulin ([place])   . Endometriosis   . Hearing impairment   . Heart murmur    at birth  . Hypertension   . Neuromuscular disorder ([place])    Cerebral palsy- very mild left side of brain  . Schizoaffective disorder ([place])    Review of Systems:  Review of Systems  Constitutional: Negative for chills, fever and weight loss.  Cardiovascular: Negative for chest pain and palpitations.  Gastrointestinal: Negative for abdominal pain.  Genitourinary: Negative for frequency.     Physical Exam:  Vitals:   03/21/18 1508  BP: 130/72  Pulse: 84  Temp: 98 F (36.7 C)  TempSrc: Oral  SpO2: 100%  Weight: 215 lb 14.4 oz (97.9 kg)  Height: 5\' 4"  (1.626 m)   General: Sitting in chair comfortably, no acute distress HEENT: Moist mucous membranes, no pharyngeal exudate CV: Regular rate and rhythm Resp: Clear breath sounds bilaterally without wheezing or crackles, normal work of breathing, no  distress  Abd: Soft, +BS, obese Extr: No lower extremity edema Neuro: Alert and oriented x3 Psych: Somewhat flat affect  Skin: Warm, dry      Assessment & Plan:   See Encounters Tab for problem based charting.  Patient discussed with Dr. Angelia Mould

## 2018-03-21 NOTE — Patient Instructions (Addendum)
Good to see you again [name].  Great job on your diabetes.  Your A1c was 6.7 today.  Continue to try to eat more vegetables and cut back on noodles (noodles can increase your blood sugar).  We are going to start a medicine for your cholesterol today.  Be sure to reschedule your eye exam appointment.   Continue your allergy medicines-both the zyrtec and the flonase. Allergies may have caused your trouble breathing last month.   We will also try to check on the order for your sleep apnea and CPAP

## 2018-03-22 LAB — MICROALBUMIN / CREATININE URINE RATIO
Creatinine, Urine: 132.2 mg/dL
MICROALB/CREAT RATIO: 14.2 mg/g{creat} (ref 0.0–30.0)
MICROALBUM., U, RANDOM: 18.8 ug/mL

## 2018-03-22 NOTE — Assessment & Plan Note (Signed)
She has a history of hypertension currently on carvedilol 3.125 twice daily.  It appears she was previously on a lisinopril-HCTZ combination which was discontinued in 2016 for hypotension and she has been on a beta-blocker since.  Given her comorbid diabetes, she may benefit from switching to an ACE inhibitor or ARB as a single agent for blood pressure control in the future.  Will hold off on making this change today, but collect microalbumin to creatinine ratio to assess for proteinuria which would further support this change.

## 2018-03-22 NOTE — Assessment & Plan Note (Signed)
She reports a history of allergic rhinitis and was previously evaluated in the clinic for exam and symptoms consistent with this.  Her reported event of cough and difficulty breathing does not seem to point to additional pathology at this point given a lack of recurrence or other features.  We will continue to treat with cetirizine and Flonase and monitor for changes or recurrent dyspnea that would suggest an alternate diagnosis.

## 2018-03-22 NOTE — Assessment & Plan Note (Addendum)
She is currently on metformin 1000 mg twice daily with to the 1.2 mg daily with A1c is at goal previously.  She would benefit from further dietary changes as she does note frequent carb intake with noodles/pasta.  Her A1c today has decreased slightly to 6.7 and at goal.  Given her age and diabetes diagnosis (as well as antipsychotic medication), will add a statin to her medication regimen today. Chart review shows she was previously intended to be on a statin but has not been maintained. As described in hypertension assessment and plan, patient may benefit from re-introduction of lisinopril for more appropriate BP control.  Patient is established with an ophthalmologist, reports that she needs to reschedule her follow-up appointment.  --Continue metformin, Victoza --Atorvastatin 20 mg daily  --Encourage ophthalmology follow-up --Microalbumin creatinine ratio

## 2018-03-22 NOTE — Progress Notes (Signed)
Internal Medicine Clinic Attending  Case discussed with Dr. Harden at the time of the visit.  We reviewed the resident's history and exam and pertinent patient test results.  I agree with the assessment, diagnosis, and plan of care documented in the resident's note.  

## 2018-03-22 NOTE — Assessment & Plan Note (Signed)
Has had sleep study confirming presence of obstructive sleep apnea, currently awaiting CPAP titration for establishment of home use.

## 2018-03-22 NOTE — Assessment & Plan Note (Signed)
Declined starting pna vaccine today

## 2018-03-29 NOTE — Progress Notes (Signed)
Sent to [employer] and [employer]

## 2018-03-30 ENCOUNTER — Ambulatory Visit (INDEPENDENT_AMBULATORY_CARE_PROVIDER_SITE_OTHER): Payer: Medicare Other | Admitting: Dietician

## 2018-03-30 ENCOUNTER — Encounter: Payer: Self-pay | Admitting: Dietician

## 2018-03-30 DIAGNOSIS — Z6837 Body mass index (BMI) 37.0-37.9, adult: Secondary | ICD-10-CM

## 2018-03-30 DIAGNOSIS — Z713 Dietary counseling and surveillance: Secondary | ICD-10-CM | POA: Diagnosis not present

## 2018-03-30 DIAGNOSIS — E118 Type 2 diabetes mellitus with unspecified complications: Secondary | ICD-10-CM | POA: Diagnosis not present

## 2018-03-30 DIAGNOSIS — Z794 Long term (current) use of insulin: Secondary | ICD-10-CM

## 2018-03-30 NOTE — Progress Notes (Signed)
  Medical Nutrition Therapy:  Appt start time: 1165 end time:  1050 Visit # 1 this year  Assessment:  Primary concerns today: glucose control and weight management.  Ms. Kyser attended group diabetes and weight management medical nutrition therapy class today with her mother.   The program included successes and challenges over the past few weeks, and how to include and make healthy snacks like popcorn. She is working on weight loss and discussed barriers to her meeting her goals.  Her goal to do that is to drink more water, eat small meals and walk for 15-30 minutes a day.   ANTHROPOMETRICS:  Wt Readings from Last 3 Encounters:  03/30/18 218 lb 11.2 oz (99.2 kg)  03/21/18 215 lb 14.4 oz (97.9 kg)  01/25/18 221 lb 3.2 oz (100.3 kg)   RBW- 150-160# MEDICATIONS: victoza 1.2 mg/day, metformin  1000 mg twice a day BLOOD SUGAR: Lab Results  Component Value Date   HGBA1C 6.7 03/21/2018    Estimated daily energy needs:  1400-1600 calories 150-170 g carbohydrates   Progress Towards Goal(s):  Some progress.   Nutritional Diagnosis:  NI-1.5 Excessive energy intake As related to excess sweets, starches and large food portions is stable As evidenced by her stable weight and blood sugar.     Intervention:  Nutrition education about healthier options for snacks and how to prepare them.   Coordination of care: recommend trial of victoza 1.8mg  daily.   Teaching Method Utilized: Visual, Auditory,Hands on Handouts given during visit include:healthy fats, goals and evaluation of goals from last month.  Barriers to learning/adherence to lifestyle change:limited finances,  mental health issues, medicines and social situation Demonstrated degree of understanding via:  Teach Back   Monitoring/Evaluation:  Dietary intake, exercise, meter, and body weight in 4 week(s). Debera Lat, RD 03/30/2018 3:55 PM.

## 2018-03-31 ENCOUNTER — Telehealth: Payer: Self-pay | Admitting: Internal Medicine

## 2018-03-31 NOTE — Telephone Encounter (Signed)
Patient calling checking on labs: "cholestrol" levels pls contact patient

## 2018-04-03 ENCOUNTER — Other Ambulatory Visit: Payer: Self-pay | Admitting: Dietician

## 2018-04-03 DIAGNOSIS — E1165 Type 2 diabetes mellitus with hyperglycemia: Secondary | ICD-10-CM

## 2018-04-03 NOTE — Progress Notes (Signed)
Referral request

## 2018-04-06 NOTE — Telephone Encounter (Signed)
Returned pt call evening of 5/17, notified of results from the encounter and purpose of statin medication. All questions answered, pt feeling well.

## 2018-04-13 ENCOUNTER — Telehealth: Payer: Self-pay | Admitting: *Deleted

## 2018-04-13 DIAGNOSIS — G4733 Obstructive sleep apnea (adult) (pediatric): Secondary | ICD-10-CM

## 2018-04-13 NOTE — Telephone Encounter (Signed)
-----   Message from Tawny Asal, MD sent at 03/22/2018  9:52 AM EDT ----- Could we check on the status of the CPAP titration study order that Dr. Jari Favre ordered a little while back? Ms. Vanevery mentioned she had not heard anything regarding scheduling

## 2018-04-14 NOTE — Telephone Encounter (Signed)
Spoke with [place].  Location Order was for [facility] instead of [facility] where she had the Sleep Study Performed.  Please place another order in Epic to the [facility] Location and they will call the patient and get her scheduled as soon as possible.  Called the patient and notified her.

## 2018-04-17 NOTE — Telephone Encounter (Signed)
Thanks!  [facility] will get her scheduled.

## 2018-05-04 ENCOUNTER — Ambulatory Visit: Payer: Self-pay | Admitting: Dietician

## 2018-05-16 ENCOUNTER — Ambulatory Visit (HOSPITAL_BASED_OUTPATIENT_CLINIC_OR_DEPARTMENT_OTHER): Payer: Medicare Other | Attending: Internal Medicine | Admitting: Internal Medicine

## 2018-05-16 VITALS — Ht 64.0 in | Wt 210.0 lb

## 2018-05-16 DIAGNOSIS — R0683 Snoring: Secondary | ICD-10-CM | POA: Insufficient documentation

## 2018-05-16 DIAGNOSIS — G4733 Obstructive sleep apnea (adult) (pediatric): Secondary | ICD-10-CM | POA: Diagnosis not present

## 2018-05-16 DIAGNOSIS — Z7984 Long term (current) use of oral hypoglycemic drugs: Secondary | ICD-10-CM | POA: Insufficient documentation

## 2018-05-16 DIAGNOSIS — Z79899 Other long term (current) drug therapy: Secondary | ICD-10-CM | POA: Diagnosis not present

## 2018-05-16 DIAGNOSIS — Z6836 Body mass index (BMI) 36.0-36.9, adult: Secondary | ICD-10-CM | POA: Diagnosis not present

## 2018-05-16 DIAGNOSIS — E119 Type 2 diabetes mellitus without complications: Secondary | ICD-10-CM | POA: Diagnosis not present

## 2018-05-16 DIAGNOSIS — I10 Essential (primary) hypertension: Secondary | ICD-10-CM | POA: Insufficient documentation

## 2018-05-16 DIAGNOSIS — F329 Major depressive disorder, single episode, unspecified: Secondary | ICD-10-CM | POA: Diagnosis not present

## 2018-05-16 DIAGNOSIS — E669 Obesity, unspecified: Secondary | ICD-10-CM | POA: Diagnosis not present

## 2018-05-16 NOTE — Progress Notes (Signed)
Pt is sch for 05/16/2018

## 2018-05-16 NOTE — Progress Notes (Signed)
Pt is sch on 05/16/2018

## 2018-05-24 ENCOUNTER — Encounter: Payer: Self-pay | Admitting: Dietician

## 2018-05-27 DIAGNOSIS — G4733 Obstructive sleep apnea (adult) (pediatric): Secondary | ICD-10-CM

## 2018-05-27 NOTE — Procedures (Signed)
Patient Name: [name], [name] Date: 05/16/2018 Gender: Female D.O.B: 1971/10/29 Age (years): 46 Referring Provider: Lemar Lofty DO Height (inches): 49 Interpreting Physician: Baird Lyons MD, ABSM Weight (lbs): 210 RPSGT: Laren Everts BMI: 36 MRN: [id number] Neck Size: 17.50  CLINICAL INFORMATION Sleep Study Type: NPSG  Indication for sleep study: Daytime Fatigue, Depression, Diabetes, Fatigue, Hypertension, Morning Headaches, Non-refreshing Sleep, Obesity, Re-Evaluation, Snoring, Witnesses Apnea / Gasping During Sleep  Epworth Sleepiness Score: 12  Most recent polysomnogram dated 07/19/2017 revealed an AHI of 8.0/h and RDI of 10.2/h.  SLEEP STUDY TECHNIQUE As per the AASM Manual for the Scoring of Sleep and Associated Events v2.3 (April 2016) with a hypopnea requiring 4% desaturations.  The channels recorded and monitored were frontal, central and occipital EEG, electrooculogram (EOG), submentalis EMG (chin), nasal and oral airflow, thoracic and abdominal wall motion, anterior tibialis EMG, snore microphone, electrocardiogram, and pulse oximetry.  MEDICATIONS Medications self-administered by patient taken the night of the study : METFORMIN, CARVEDILOL, [place] The study was initiated at 9:41:36 PM and ended at 4:50:28 AM.  Sleep onset time was 52.8 minutes and the sleep efficiency was 56.9%%. The total sleep time was 244 minutes.  Stage REM latency was 351.5 minutes.  The patient spent 4.5%% of the night in stage N1 sleep, 91.6%% in stage N2 sleep, 0.0%% in stage N3 and 3.89% in REM.  Alpha intrusion was absent.  Supine sleep was 0.00%.  RESPIRATORY PARAMETERS The overall apnea/hypopnea index (AHI) was 1.5 per hour. There were 1 total apneas, including 0 obstructive, 1 central and 0 mixed apneas. There were 5 hypopneas and 1 RERAs.  The AHI during Stage REM sleep was 37.9 per hour.  AHI while supine was N/A per hour.  The mean  oxygen saturation was 95.9%. The minimum SpO2 during sleep was 84.0%.  loud snoring was noted during this study.  CARDIAC DATA The 2 lead EKG demonstrated sinus rhythm. The mean heart rate was 75.3 beats per minute. Other EKG findings include: PVCs.  LEG MOVEMENT DATA The total PLMS were 0 with a resulting PLMS index of 0.0. Associated arousal with leg movement index was 0.0 .  IMPRESSIONS - Occasional obstructive sleep apneas occurred during this study, within normal limits. (AHI = 1.5/h). - No significant central sleep apnea occurred during this study (CAI = 0.2/h). - Mild oxygen desaturation was noted during this study (Min O2 = 84.0%, Mean 96.4%). - The patient snored with loud snoring volume. - Patient had difficulty maintaining sleep and was awake between 12:45 and 2:45 AM. REM time was decreased. Most respiratory events occurred during REM. - EKG findings include PVCs. - Clinically significant periodic limb movements did not occur during sleep. No significant associated arousals.  DIAGNOSIS - Primary snoring  RECOMMENDATIONS - Manage for snoring if appropriate- consider nasopharyngeal obstruction such as rhinitis or tonsil hypertrophy. Consider oral appliance or chin strap. - Be careful with alcohol, sedatives and other CNS depressants that may worsen sleep apnea and disrupt normal sleep architecture. - Sleep hygiene should be reviewed to assess factors that may improve sleep quality. - Weight management and regular exercise should be initiated or continued if appropriate.  [Electronically signed] 05/27/2018 09:33 AM  Baird Lyons MD, ABSM Diplomate, American Board of Sleep Medicine   NPI: [id number]                          [place], American Board of Sleep Medicine  ELECTRONICALLY  SIGNED ON:  05/27/2018, 9:28 AM [place] PH: [phone]   FX: [phone] [place]

## 2018-05-31 ENCOUNTER — Telehealth: Payer: Self-pay | Admitting: Student in an Organized Health Care Education/Training Program

## 2018-05-31 NOTE — Telephone Encounter (Signed)
I tried calling Ms. Gerety to communicate result of recent sleep study; no answer and no voicemail. The study is much improved compared to 2018. There was no significant OSA this time, so no CPAP titration was done. 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 I don't think we have to make any other changes unless this is still bothersome to her, then we could refer her to sleep medicine to be fitted for a mouth piece.   Ulis Rias, can you help me get the results communicated to the patient please.

## 2018-06-01 NOTE — Telephone Encounter (Signed)
Spoke with patient and informed her of the results.  She will hold off on any additional referrals at this time.Despina Hidden Cassady7/18/20191:05 PM

## 2018-06-05 ENCOUNTER — Encounter: Payer: Self-pay | Admitting: *Deleted

## 2018-06-05 NOTE — Addendum Note (Signed)
Addended by: Hulan Fray on: 06/05/2018 05:24 PM   Modules accepted: Orders

## 2018-06-22 ENCOUNTER — Encounter: Payer: Self-pay | Admitting: Dietician

## 2018-06-22 ENCOUNTER — Telehealth: Payer: Self-pay | Admitting: Dietician

## 2018-06-22 NOTE — Telephone Encounter (Signed)
Unable to leave a message on phone voicemail about rescheduling her eye exam. Will mail letter asking her to call Dr. Renaldo Harrison office to reschedule her appointment.

## 2018-06-23 NOTE — Telephone Encounter (Signed)
Thank you so much for reaching out to her.

## 2018-07-26 DIAGNOSIS — H40023 Open angle with borderline findings, high risk, bilateral: Secondary | ICD-10-CM | POA: Diagnosis not present

## 2018-07-26 DIAGNOSIS — H2513 Age-related nuclear cataract, bilateral: Secondary | ICD-10-CM | POA: Diagnosis not present

## 2018-07-26 DIAGNOSIS — H40053 Ocular hypertension, bilateral: Secondary | ICD-10-CM | POA: Diagnosis not present

## 2018-07-26 DIAGNOSIS — E119 Type 2 diabetes mellitus without complications: Secondary | ICD-10-CM | POA: Diagnosis not present

## 2018-07-26 DIAGNOSIS — H35033 Hypertensive retinopathy, bilateral: Secondary | ICD-10-CM | POA: Diagnosis not present

## 2018-07-26 DIAGNOSIS — H25013 Cortical age-related cataract, bilateral: Secondary | ICD-10-CM | POA: Diagnosis not present

## 2018-07-26 DIAGNOSIS — H0100B Unspecified blepharitis left eye, upper and lower eyelids: Secondary | ICD-10-CM | POA: Diagnosis not present

## 2018-07-26 DIAGNOSIS — H18413 Arcus senilis, bilateral: Secondary | ICD-10-CM | POA: Diagnosis not present

## 2018-07-26 DIAGNOSIS — H40003 Preglaucoma, unspecified, bilateral: Secondary | ICD-10-CM | POA: Diagnosis not present

## 2018-07-26 DIAGNOSIS — H0100A Unspecified blepharitis right eye, upper and lower eyelids: Secondary | ICD-10-CM | POA: Diagnosis not present

## 2018-07-26 DIAGNOSIS — H11153 Pinguecula, bilateral: Secondary | ICD-10-CM | POA: Diagnosis not present

## 2018-07-26 DIAGNOSIS — H3589 Other specified retinal disorders: Secondary | ICD-10-CM | POA: Diagnosis not present

## 2018-07-28 ENCOUNTER — Other Ambulatory Visit: Payer: Self-pay | Admitting: Internal Medicine

## 2018-07-28 DIAGNOSIS — E0800 Diabetes mellitus due to underlying condition with hyperosmolarity without nonketotic hyperglycemic-hyperosmolar coma (NKHHC): Secondary | ICD-10-CM

## 2018-07-28 DIAGNOSIS — R601 Generalized edema: Secondary | ICD-10-CM

## 2018-08-16 DIAGNOSIS — E119 Type 2 diabetes mellitus without complications: Secondary | ICD-10-CM | POA: Diagnosis not present

## 2018-08-16 DIAGNOSIS — H0100A Unspecified blepharitis right eye, upper and lower eyelids: Secondary | ICD-10-CM | POA: Diagnosis not present

## 2018-08-16 DIAGNOSIS — H18413 Arcus senilis, bilateral: Secondary | ICD-10-CM | POA: Diagnosis not present

## 2018-08-16 DIAGNOSIS — H40023 Open angle with borderline findings, high risk, bilateral: Secondary | ICD-10-CM | POA: Diagnosis not present

## 2018-08-16 DIAGNOSIS — H11823 Conjunctivochalasis, bilateral: Secondary | ICD-10-CM | POA: Diagnosis not present

## 2018-08-16 DIAGNOSIS — H0100B Unspecified blepharitis left eye, upper and lower eyelids: Secondary | ICD-10-CM | POA: Diagnosis not present

## 2018-08-16 DIAGNOSIS — H04123 Dry eye syndrome of bilateral lacrimal glands: Secondary | ICD-10-CM | POA: Diagnosis not present

## 2018-08-16 DIAGNOSIS — H25013 Cortical age-related cataract, bilateral: Secondary | ICD-10-CM | POA: Diagnosis not present

## 2018-08-16 DIAGNOSIS — Z7984 Long term (current) use of oral hypoglycemic drugs: Secondary | ICD-10-CM | POA: Diagnosis not present

## 2018-09-26 ENCOUNTER — Ambulatory Visit (INDEPENDENT_AMBULATORY_CARE_PROVIDER_SITE_OTHER): Payer: Medicare Other | Admitting: Internal Medicine

## 2018-09-26 ENCOUNTER — Other Ambulatory Visit: Payer: Self-pay

## 2018-09-26 ENCOUNTER — Encounter: Payer: Self-pay | Admitting: Internal Medicine

## 2018-09-26 VITALS — BP 133/79 | HR 109 | Temp 98.8°F | Ht 64.0 in | Wt 222.8 lb

## 2018-09-26 DIAGNOSIS — F259 Schizoaffective disorder, unspecified: Secondary | ICD-10-CM

## 2018-09-26 DIAGNOSIS — F419 Anxiety disorder, unspecified: Secondary | ICD-10-CM | POA: Diagnosis not present

## 2018-09-26 DIAGNOSIS — E119 Type 2 diabetes mellitus without complications: Secondary | ICD-10-CM

## 2018-09-26 DIAGNOSIS — G4700 Insomnia, unspecified: Secondary | ICD-10-CM

## 2018-09-26 DIAGNOSIS — Z23 Encounter for immunization: Secondary | ICD-10-CM | POA: Diagnosis not present

## 2018-09-26 DIAGNOSIS — E1140 Type 2 diabetes mellitus with diabetic neuropathy, unspecified: Secondary | ICD-10-CM | POA: Diagnosis not present

## 2018-09-26 DIAGNOSIS — F258 Other schizoaffective disorders: Secondary | ICD-10-CM

## 2018-09-26 DIAGNOSIS — R112 Nausea with vomiting, unspecified: Secondary | ICD-10-CM

## 2018-09-26 DIAGNOSIS — Z79899 Other long term (current) drug therapy: Secondary | ICD-10-CM

## 2018-09-26 DIAGNOSIS — G529 Cranial nerve disorder, unspecified: Secondary | ICD-10-CM

## 2018-09-26 DIAGNOSIS — Z7984 Long term (current) use of oral hypoglycemic drugs: Secondary | ICD-10-CM

## 2018-09-26 LAB — POCT GLYCOSYLATED HEMOGLOBIN (HGB A1C): HEMOGLOBIN A1C: 7.1 % — AB (ref 4.0–5.6)

## 2018-09-26 LAB — GLUCOSE, CAPILLARY: GLUCOSE-CAPILLARY: 156 mg/dL — AB (ref 70–99)

## 2018-09-26 MED ORDER — ACCU-CHEK FASTCLIX LANCETS MISC
5 refills | Status: DC
Start: 2018-09-26 — End: 2018-10-06

## 2018-09-26 MED ORDER — GLUCOSE BLOOD VI STRP: ORAL_STRIP | 5 refills | Status: DC

## 2018-09-26 NOTE — Patient Instructions (Signed)
[name]  Thank you for coming in to the clinic. Here are the recommendations based on what we discussed:  Please continue to take your Duloxetine (Cymbalta) as prescribed with 1 tablet every day.  Please call us back if your fevers, nausea and vomiting does not resolve after restarting your medication.  Please continue to take Tylenol as needed.  Please try to check your blood sugar more often so we can determine why your Hgb A1c was elevated today.  Please return to [place] in 3 months.   Thank you very much.  -Dr.Lee

## 2018-09-26 NOTE — Progress Notes (Signed)
CC: Nausea and vomiting  HPI: [name] is a 47 y.o. F w/ PMH of shizoaffective disorder and T2DM presenting with acute complaints of nausea, vomiting, and visual hallucinations. She states she was in her usual state of health until she ran out of her benztropine and duloxetine and she has been 'seeing dead people.' She denies any suicidal or homicidal ideations and denies any auditory or tactile hallucinations. She also mentions some associated nausea and NBNB vomiting. She states she has had trouble getting these medications filled from her behavioral health clinic and expresses frustration with delays in refilling her prescription. She mentions that she just picked up her supply today. She also mentions worsening of her lower extremity neuropathy with burning sensation, but does not follow podiatry.  Past Medical History:  Diagnosis Date  . Anemia   . Congenital cerebral palsy ([place])   . Depression   . Diabetes mellitus due to abnormal insulin ([place])   . Endometriosis   . Hearing impairment   . Heart murmur    at birth  . Hypertension   . Neuromuscular disorder ([place])    Cerebral palsy- very mild left side of brain  . Schizoaffective disorder ([place])    Review of Systems: Review of Systems  Constitutional: Negative for chills, fever and malaise/fatigue.  Eyes: Negative for blurred vision and double vision.  Respiratory: Negative for cough, sputum production and shortness of breath.   Cardiovascular: Negative for chest pain, palpitations and leg swelling.  Gastrointestinal: Positive for nausea and vomiting. Negative for constipation and diarrhea.  Neurological: Positive for tingling and sensory change. Negative for dizziness and headaches.  Psychiatric/Behavioral: Positive for hallucinations. Negative for depression, substance abuse and suicidal ideas. The patient is nervous/anxious and has insomnia.      Physical Exam: Vitals:   09/26/18 1525  BP: 133/79  Pulse: (!) 109    Temp: 98.8 F (37.1 C)  TempSrc: Oral  SpO2: 100%  Weight: 222 lb 12.8 oz (101.1 kg)  Height: 5\' 4"  (1.626 m)   Physical Exam  Constitutional: She is oriented to person, place, and time. She appears well-developed and well-nourished. She appears distressed (anxious-appearing).  HENT:  Head: Normocephalic and atraumatic.  Mouth/Throat: Oropharynx is clear and moist. No oropharyngeal exudate.  Eyes: Pupils are equal, round, and reactive to light. Conjunctivae and EOM are normal. No scleral icterus.  Neck: Normal range of motion. Neck supple.  Cardiovascular: Regular rhythm, normal heart sounds and intact distal pulses.  No murmur heard. tachycardic  Respiratory: Effort normal and breath sounds normal. She has no wheezes. She has no rales.  GI: Soft. Bowel sounds are normal. There is no tenderness. There is no guarding.  Musculoskeletal: Normal range of motion. She exhibits no edema or tenderness.  Lymphadenopathy:    She has no cervical adenopathy.  Neurological: She is alert and oriented to person, place, and time. She has normal reflexes. A cranial nerve deficit is present.  Diminished pinprick sensation bilateral lower extremities  Skin: Skin is warm and dry.  Psychiatric: She has a normal mood and affect. Her behavior is normal. Judgment and thought content normal.      Assessment & Plan:   Type 2 diabetes mellitus ([place]) [name] brought her glucometer today which showed average bg reading of 161 out of 5 readings from 10/14 to 11/12 with high at 193 and low at 116. She mentions that she has been unable to regularly check her blood glucose reading due to running out of testing supplies. She  mentions that she has had no trouble taking her metformin and victoza. Denies any hypoglycemic events, abdominal pain, diarrhea.  - Re-sent diabetic testing supplies as requested - C/w metformin, victorza - Check hgb a1c today - F/u with opthalmology    Need for immunization against  influenza Agrees to get flu shot in clinic today  Schizoaffective disorder, chronic condition ([place]) History of schizoaffective disorder treated with duloxetine and benztropine with 2 week history of abruption cessation of medication due to difficulty getting refills. Just received her refills today. Complaining of visual hallucinations but denies suicidal or homicidal ideations. States she had similar symptoms in the past prior to diagnosis. Explained to the patient that she should promptly call either me or her psychiatrist if she is running low on her meds. She expressed understanding.  - Recommend resuming therapy - Recommend close follow up with psych - C/w current regimen: benztropine, duloxetine  Non-intractable vomiting [name] presents with complaints of 2 week history of nausea and vomiting in line with cessation of her benztropine and duloxetine without fever, abdominal pain, diarrhea. Timeline indicates this may be related to withdrawal from central acting medications vs psychogenic nausea and vomiting due to anxiety and visual hallucinations associated with her schizoaffective disorder. Currently not endorsing nausea or emesis. Other differential includes DKA and viral gastroenteritis but not endorsing fevers or sick contact and glucose at 156.  - Recommend adequate hydration and observation while back on her regular psych meds    Patient seen with Dr. Angelia Mould   -Gilberto Better, PGY1

## 2018-09-29 NOTE — Assessment & Plan Note (Addendum)
[name] brought her glucometer today which showed average bg reading of 161 out of 5 readings from 10/14 to 11/12 with high at 193 and low at 116. She mentions that she has been unable to regularly check her blood glucose reading due to running out of testing supplies. She mentions that she has had no trouble taking her metformin and victoza. Denies any hypoglycemic events, abdominal pain, diarrhea.  - Re-sent diabetic testing supplies as requested - C/w metformin, victorza - Check hgb a1c today - F/u with opthalmology

## 2018-09-30 ENCOUNTER — Encounter: Payer: Self-pay | Admitting: Internal Medicine

## 2018-09-30 DIAGNOSIS — Z23 Encounter for immunization: Secondary | ICD-10-CM | POA: Insufficient documentation

## 2018-09-30 DIAGNOSIS — R1110 Vomiting, unspecified: Secondary | ICD-10-CM | POA: Insufficient documentation

## 2018-09-30 NOTE — Assessment & Plan Note (Addendum)
Mrs.Buttrey presents with complaints of 2 week history of nausea and vomiting in line with cessation of her benztropine and duloxetine without fever, abdominal pain, diarrhea. Timeline indicates this may be related to withdrawal from central acting medications vs psychogenic nausea and vomiting due to anxiety and visual hallucinations associated with her schizoaffective disorder. Currently not endorsing nausea or emesis. Other differential includes DKA and viral gastroenteritis but not endorsing fevers or sick contact and glucose at 156.  - Recommend adequate hydration and observation while back on her regular psych meds

## 2018-09-30 NOTE — Assessment & Plan Note (Signed)
History of schizoaffective disorder treated with duloxetine and benztropine with 2 week history of abruption cessation of medication due to difficulty getting refills. Just received her refills today. Complaining of visual hallucinations but denies suicidal or homicidal ideations. States she had similar symptoms in the past prior to diagnosis. Explained to the patient that she should promptly call either me or her psychiatrist if she is running low on her meds. She expressed understanding.  - Recommend resuming therapy - Recommend close follow up with psych - C/w current regimen: benztropine, duloxetine

## 2018-09-30 NOTE — Assessment & Plan Note (Signed)
Agrees to get flu shot in clinic today

## 2018-10-02 NOTE — Progress Notes (Signed)
Internal Medicine Clinic Attending  I saw and evaluated the patient.  I personally confirmed the key portions of the history and exam documented by Dr. Truman Hayward and I reviewed pertinent patient test results.  The assessment, diagnosis, and plan were formulated together and I agree with the documentation in the resident's note.    I do suspect her symptoms are from abrupt cessation of serotonin agent, should improve with resumption, discussed she will need to wean off or call us for temperorary refill if this happens again.

## 2018-10-06 ENCOUNTER — Other Ambulatory Visit: Payer: Self-pay | Admitting: *Deleted

## 2018-10-06 DIAGNOSIS — E119 Type 2 diabetes mellitus without complications: Secondary | ICD-10-CM

## 2018-10-06 MED ORDER — ACCU-CHEK FASTCLIX LANCETS MISC: 5 refills | Status: DC

## 2018-10-06 NOTE — Telephone Encounter (Signed)
Pt's daughter stated pt uses [facility] for all of her diabetic supplies; otherwise she uses [facility].

## 2018-10-20 ENCOUNTER — Other Ambulatory Visit: Payer: Self-pay | Admitting: Internal Medicine

## 2018-10-20 DIAGNOSIS — Z1231 Encounter for screening mammogram for malignant neoplasm of breast: Secondary | ICD-10-CM

## 2018-10-31 ENCOUNTER — Ambulatory Visit: Payer: Medicare Other | Admitting: Dietician

## 2018-10-31 ENCOUNTER — Encounter: Payer: Self-pay | Admitting: Dietician

## 2018-10-31 DIAGNOSIS — E119 Type 2 diabetes mellitus without complications: Secondary | ICD-10-CM

## 2018-10-31 NOTE — Progress Notes (Signed)
Documentation: Ms. Elem was here with her mother today. She is having problems with her blood glucose meter working. She bought new batteries, but it still didn't work. Writer tried resetting the meter, installing second pair of new batteries and neither of these worked. A new sample meter was provided to patient ([employer]) that uses that same strips and lancets.  Debera Lat, RD 10/31/2018 2:44 PM.

## 2018-11-28 ENCOUNTER — Encounter: Payer: Self-pay | Admitting: Internal Medicine

## 2018-11-29 ENCOUNTER — Ambulatory Visit: Payer: Self-pay

## 2018-11-30 ENCOUNTER — Other Ambulatory Visit: Payer: Self-pay

## 2018-11-30 ENCOUNTER — Ambulatory Visit (HOSPITAL_COMMUNITY)
Admission: RE | Admit: 2018-11-30 | Discharge: 2018-11-30 | Disposition: A | Discharge status: Home / Self Care | Payer: Medicare Other | Source: Ambulatory Visit | Attending: Family Medicine | Admitting: Family Medicine

## 2018-11-30 ENCOUNTER — Ambulatory Visit (INDEPENDENT_AMBULATORY_CARE_PROVIDER_SITE_OTHER): Payer: Medicare Other | Admitting: Internal Medicine

## 2018-11-30 VITALS — BP 134/93 | HR 91 | Temp 97.8°F | Ht 64.0 in | Wt 226.4 lb

## 2018-11-30 DIAGNOSIS — F259 Schizoaffective disorder, unspecified: Secondary | ICD-10-CM | POA: Diagnosis not present

## 2018-11-30 DIAGNOSIS — Z79899 Other long term (current) drug therapy: Secondary | ICD-10-CM | POA: Diagnosis not present

## 2018-11-30 DIAGNOSIS — R51 Headache: Secondary | ICD-10-CM

## 2018-11-30 DIAGNOSIS — I10 Essential (primary) hypertension: Secondary | ICD-10-CM

## 2018-11-30 DIAGNOSIS — E119 Type 2 diabetes mellitus without complications: Secondary | ICD-10-CM

## 2018-11-30 DIAGNOSIS — R0789 Other chest pain: Secondary | ICD-10-CM

## 2018-11-30 DIAGNOSIS — Z7984 Long term (current) use of oral hypoglycemic drugs: Secondary | ICD-10-CM

## 2018-11-30 DIAGNOSIS — G809 Cerebral palsy, unspecified: Secondary | ICD-10-CM

## 2018-11-30 MED ORDER — GLUCOSE BLOOD VI STRP: ORAL_STRIP | 5 refills | Status: DC

## 2018-11-30 MED ORDER — LISINOPRIL 10 MG PO TABS: 10.0000 mg | ORAL_TABLET | Freq: Every day | ORAL | 0 refills | Status: DC

## 2018-11-30 NOTE — Progress Notes (Signed)
CC: Chest Pain  HPI: [name] is a 48 y.o. F w/ PMH of T2DM, HTN, schizoaffective disorder and cerebral palsy presenting with complaints of atypical chest pain. She was in her usual state of health until 2 days ago when she experienced 5 min duration stabbing sharp chest pain that radiated from R upper neck down to chest while watching TV. She states she had never experienced pain like this before and states she was alarmed and wanted to come to clinic for evaluation. She mentions concomitant headache but denies any concurrent nausea, diaphoresis, or dyspnea. She is currently not endorsing any chest pain and she has no significant hx of coronary artery disease. She denies any significant family history for early cardiac disease or stroke. She states she noticed her blood pressure had been elevated and wanted to know if this may have contributed to her chest pain.  Past Medical History:  Diagnosis Date  . Anemia   . Congenital cerebral palsy ([place])   . Depression   . 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 Endometriosis   . Hearing impairment   . Heart murmur    at birth  . Hypertension   . Neuromuscular disorder ([place])    Cerebral palsy- very mild left side of brain  . Schizoaffective disorder ([place])     Review of Systems: Review of Systems  Constitutional: Negative for chills, fever and malaise/fatigue.  Eyes: Negative for blurred vision.  Respiratory: Negative for cough and shortness of breath.   Cardiovascular: Positive for chest pain. Negative for palpitations and leg swelling.  Gastrointestinal: Negative for constipation, diarrhea, nausea and vomiting.  Musculoskeletal: Negative for back pain and neck pain.  Neurological: Positive for headaches.     Physical Exam: Vitals:   11/30/18 1009  BP: (!) 134/93  Pulse: 91  Temp: 97.8 F (36.6 C)  TempSrc: Oral  SpO2: 100%  Weight: 226 lb 6.4 oz (102.7 kg)  Height: 5\' 4"  (1.626 m)    Physical Exam    Constitutional: She is oriented to person, place, and time. She appears well-developed and well-nourished. No distress.  obese  HENT:  Mouth/Throat: Oropharynx is clear and moist. No oropharyngeal exudate.  Eyes: Conjunctivae are normal. No scleral icterus.  Neck: Normal range of motion. Neck supple. No JVD present.  Cardiovascular: Normal rate, regular rhythm, normal heart sounds and intact distal pulses.  No murmur heard. Respiratory: Effort normal and breath sounds normal. She has no wheezes. She has no rales.  GI: Soft. Bowel sounds are normal.  Musculoskeletal: Normal range of motion.        General: No edema.  Neurological: She is alert and oriented to person, place, and time.  Skin: Skin is warm and dry.     Assessment & Plan:   Atypical chest pain Presents with atypical chest pain that occurred 2 days ago described as 5 minutes of sharp stabbing pain radiating from right neck down to her chest with concomitant headache without blurry vision or focal weakness. Denies any dyspnea, nausea, diaphoreses. Was watching TV on cough when pain occurred. Denies significant family hx of early cardiac disease or stroke. Mentions her bp has been fluctuating and brings bp 342-876O systolic. Her chest pain is most likely musculoskeletal in origin considering atypical distribution and presentation. Heart Score is 1 due to T2DM, HTN. Considering this is her first episode, now currently resolved, and she has low likelihood of ACS will get EKG to assess for structural damage and will  refer to cardiology if any abnormal findings.  EKG show normal sinus with no significant change from priors. No cardiac work-up indicated at this time. May do stress test in future if chest pain re-occurs.  - Reassured patient this is most likely not cardiac in nature - Monitor for now  Hypertension BP Readings from Last 3 Encounters:  11/30/18 (!) 134/93  09/26/18 133/79  03/21/18 130/72   Blood pressure not at  goal. However not high enough to cause her atypical chest pain. Currently on carvedilol 3.125mg  BID and furosemide 20mg . Used to be on lisinopril-HCTZ in the past but d/ced due to hypotension. Considering consistent hypertension during last 3 visits, will add on lisinopril at low dose for now and monitor.  -BMP today - Lisinopril 10mg  daily   Type 2 diabetes mellitus ([place]) [name] brought her glucometer today which showed fasting blood glucose ranging between 120s to 160s. She denies any polyphagia, polyuria, polydipsia. Currently running low on test strips. Will refill test strips as requested. Not yet due for hgb a1c.  - C/w metformin, victoza    Patient discussed with Dr. Dareen Piano   -Gilberto Better, PGY1

## 2018-11-30 NOTE — Patient Instructions (Signed)
Thank you for allowing [place] to provide your care today. Today we discussed your chest pain.  I have done an EKG and your results look normal.  Today we are starting you on lisinopril 5mg  for your blood pressure.  Please follow-up in 1 month.    Should you have any questions or concerns please call the internal medicine clinic at [phone].     Nonspecific Chest Pain Chest pain can be caused by many different conditions. Some causes of chest pain can be life-threatening. These will require treatment right away. Serious causes of chest pain include:  Heart attack.  A tear in the body's main blood vessel.  Redness and swelling (inflammation) around your heart.  Blood clot in your lungs. Other causes of chest pain may not be so serious. These include:  Heartburn.  Anxiety or stress.  Damage to bones or muscles in your chest.  Lung infections. Chest pain can feel like:  Pain or discomfort in your chest.  Crushing, pressure, aching, or squeezing pain.  Burning or tingling.  Dull or sharp pain that is worse when you move, cough, or take a deep breath.  Pain or discomfort that is also felt in your back, neck, jaw, shoulder, or arm, or pain that spreads to any of these areas. It is hard to know whether your pain is caused by something that is serious or something that is not so serious. So it is important to see your doctor right away if you have chest pain. Follow these instructions at home: Medicines  Take over-the-counter and prescription medicines only as told by your doctor.  If you were prescribed an antibiotic medicine, take it as told by your doctor. Do not stop taking the antibiotic even if you start to feel better. Lifestyle   Rest as told by your doctor.  Do not use any products that contain nicotine or tobacco, such as cigarettes, e-cigarettes, and chewing tobacco. If you need help quitting, ask your doctor.  Do not drink alcohol.  Make lifestyle changes  as told by your doctor. These may include: ? Getting regular exercise. Ask your doctor what activities are safe for you. ? Eating a heart-healthy diet. A diet and nutrition specialist (dietitian) can help you to learn healthy eating options. ? Staying at a healthy weight. ? Treating diabetes or high blood pressure, if needed. ? Lowering your stress. Activities such as yoga and relaxation techniques can help. General instructions  Pay attention to any changes in your symptoms. Tell your doctor about them or any new symptoms.  Avoid any activities that cause chest pain.  Keep all follow-up visits as told by your doctor. This is important. You may need more testing if your chest pain does not go away. Contact a doctor if:  Your chest pain does not go away.  You feel depressed.  You have a fever. Get help right away if:  Your chest pain is worse.  You have a cough that gets worse, or you cough up blood.  You have very bad (severe) pain in your belly (abdomen).  You pass out (faint).  You have either of these for no clear reason: ? Sudden chest discomfort. ? Sudden discomfort in your arms, back, neck, or jaw.  You have shortness of breath at any time.  You suddenly start to sweat, or your skin gets clammy.  You feel sick to your stomach (nauseous).  You throw up (vomit).  You suddenly feel lightheaded or dizzy.  You feel very  weak or tired.  Your heart starts to beat fast, or it feels like it is skipping beats. These symptoms may be an emergency. Do not wait to see if the symptoms will go away. Get medical help right away. Call your local emergency services (911 in the [place] Do not drive yourself to the hospital. Summary  Chest pain can be caused by many different conditions. The cause may be serious and need treatment right away. If you have chest pain, see your doctor right away.  Follow your doctor's instructions for taking medicines and making lifestyle  changes.  Keep all follow-up visits as told by your doctor. This includes visits for any further testing if your chest pain does not go away.  Be sure to know the signs that show that your condition has become worse. Get help right away if you have these symptoms. This information is not intended to replace advice given to you by your health care provider. Make sure you discuss any questions you have with your health care provider. Document Released: 04/19/2008 Document Revised: 05/04/2018 Document Reviewed: 05/04/2018 Elsevier Interactive Patient Education  2019 [employer].

## 2018-12-01 ENCOUNTER — Encounter: Payer: Self-pay | Admitting: Internal Medicine

## 2018-12-01 LAB — BMP8+ANION GAP
Anion Gap: 17 mmol/L (ref 10.0–18.0)
BUN/Creatinine Ratio: 10 (ref 9–23)
BUN: 6 mg/dL (ref 6–24)
CO2: 24 mmol/L (ref 20–29)
Calcium: 9.5 mg/dL (ref 8.7–10.2)
Chloride: 101 mmol/L (ref 96–106)
Creatinine, Ser: 0.62 mg/dL (ref 0.57–1.00)
GFR calc Af Amer: 126 mL/min/{1.73_m2} (ref >59)
GFR calc non Af Amer: 109 mL/min/{1.73_m2} (ref >59)
Glucose: 127 mg/dL — ABNORMAL HIGH (ref 65–99)
Potassium: 4 mmol/L (ref 3.5–5.2)
Sodium: 142 mmol/L (ref 134–144)

## 2018-12-01 NOTE — Assessment & Plan Note (Signed)
BP Readings from Last 3 Encounters:  11/30/18 (!) 134/93  09/26/18 133/79  03/21/18 130/72   Blood pressure not at goal. However not high enough to cause her atypical chest pain. Currently on carvedilol 3.125mg  BID and furosemide 20mg . Used to be on lisinopril-HCTZ in the past but d/ced due to hypotension. Considering consistent hypertension during last 3 visits, will add on lisinopril at low dose for now and monitor.  -BMP today - Lisinopril 10mg  daily

## 2018-12-01 NOTE — Assessment & Plan Note (Signed)
[name] brought her glucometer today which showed fasting blood glucose ranging between 120s to 160s. She denies any polyphagia, polyuria, polydipsia. Currently running low on test strips. Will refill test strips as requested. Not yet due for hgb a1c.  - C/w metformin, victoza

## 2018-12-01 NOTE — Assessment & Plan Note (Signed)
Presents with atypical chest pain that occurred 2 days ago described as 5 minutes of sharp stabbing pain radiating from right neck down to her chest with concomitant headache without blurry vision or focal weakness. Denies any dyspnea, nausea, diaphoreses. Was watching TV on cough when pain occurred. Denies significant family hx of early cardiac disease or stroke. Mentions her bp has been fluctuating and brings bp 614-431V systolic. Her chest pain is most likely musculoskeletal in origin considering atypical distribution and presentation. Heart Score is 1 due to T2DM, HTN. Considering this is her first episode, now currently resolved, and she has low likelihood of ACS will get EKG to assess for structural damage and will refer to cardiology if any abnormal findings.  EKG show normal sinus with no significant change from priors. No cardiac work-up indicated at this time. May do stress test in future if chest pain re-occurs.  - Reassured patient this is most likely not cardiac in nature - Monitor for now

## 2018-12-04 NOTE — Progress Notes (Signed)
Internal Medicine Clinic Attending ° °Case discussed with Dr. Lee at the time of the visit.  We reviewed the resident’s history and exam and pertinent patient test results.  I agree with the assessment, diagnosis, and plan of care documented in the resident’s note.  °

## 2018-12-26 ENCOUNTER — Encounter: Payer: Self-pay | Admitting: Internal Medicine

## 2018-12-26 ENCOUNTER — Ambulatory Visit: Payer: Self-pay

## 2019-01-10 DIAGNOSIS — F250 Schizoaffective disorder, bipolar type: Secondary | ICD-10-CM | POA: Diagnosis not present

## 2019-01-16 DIAGNOSIS — F250 Schizoaffective disorder, bipolar type: Secondary | ICD-10-CM | POA: Diagnosis not present

## 2019-01-29 ENCOUNTER — Other Ambulatory Visit: Payer: Self-pay | Admitting: Internal Medicine

## 2019-01-29 DIAGNOSIS — I10 Essential (primary) hypertension: Secondary | ICD-10-CM

## 2019-02-06 DIAGNOSIS — F250 Schizoaffective disorder, bipolar type: Secondary | ICD-10-CM | POA: Diagnosis not present

## 2019-02-07 DIAGNOSIS — F250 Schizoaffective disorder, bipolar type: Secondary | ICD-10-CM | POA: Diagnosis not present

## 2019-03-01 ENCOUNTER — Telehealth: Payer: Self-pay

## 2019-03-01 NOTE — Telephone Encounter (Signed)
appt 4/17 at 1515, [facility] RN NEEDS TO COME WITH PT

## 2019-03-01 NOTE — Telephone Encounter (Signed)
I will defer to Dr. Truman Hayward, I think the data from the home health nurse will be helpful in triaging further however.  Systolic BP of 448 and headache is different from 220

## 2019-03-01 NOTE — Telephone Encounter (Signed)
Trying to call [facility] rn have left message for her to call triage

## 2019-03-01 NOTE — Telephone Encounter (Signed)
[name] requesting to speak with a nurse about pt having high bp reading. Please call back.

## 2019-03-01 NOTE — Telephone Encounter (Signed)
[facility] case mng hh nurse calls and states she called pt's home to remind of appt visit to the home today, mother answered and did all the talking. She stated pt has been having for appr 2 months elevated bp's, h/a and generally not feeling well. Pt has missed appt and has not called for this problem PLAN: When nurse visits pt she will check VS, count meds, have her meds changed to punch paks and call triage with assessment.  Would you like a telehealth visit or a face to face visit or just wait?

## 2019-03-01 NOTE — Telephone Encounter (Signed)
bp 127/92 HR 93 sitting bp 126/95 HR 107 standing H/a 7/10- took tylenol, no help.

## 2019-03-02 ENCOUNTER — Ambulatory Visit (INDEPENDENT_AMBULATORY_CARE_PROVIDER_SITE_OTHER): Payer: Medicare Other | Admitting: Internal Medicine

## 2019-03-02 ENCOUNTER — Other Ambulatory Visit: Payer: Self-pay

## 2019-03-02 VITALS — BP 135/94 | HR 115 | Temp 98.4°F | Wt 228.3 lb

## 2019-03-02 DIAGNOSIS — I10 Essential (primary) hypertension: Secondary | ICD-10-CM | POA: Diagnosis not present

## 2019-03-02 DIAGNOSIS — J309 Allergic rhinitis, unspecified: Secondary | ICD-10-CM | POA: Diagnosis not present

## 2019-03-02 DIAGNOSIS — Z79899 Other long term (current) drug therapy: Secondary | ICD-10-CM | POA: Diagnosis not present

## 2019-03-02 MED ORDER — CARVEDILOL 3.125 MG PO TABS: ORAL_TABLET | ORAL | 3 refills | Status: DC

## 2019-03-02 NOTE — Patient Instructions (Signed)
Thank you for allowing [place] to provide your care. Please RESTART your carvedilol (coreg) as prescribed.   Postnasal Drip Postnasal drip is the feeling of mucus going down the back of your throat. Mucus is a slimy substance that moistens and cleans your nose and throat, as well as the air pockets in face bones near your forehead and cheeks (sinuses). Small amounts of mucus pass from your nose and sinuses down the back of your throat all the time. This is normal. When you produce too much mucus or the mucus gets too thick, you can feel it. Some common causes of postnasal drip include:  Having more mucus because of: ? A cold or the flu. ? Allergies. ? Cold air. ? Certain medicines.  Having more mucus that is thicker because of: ? A sinus or nasal infection. ? Dry air. ? A food allergy. Follow these instructions at home: Relieving discomfort   Gargle with a salt-water mixture 3-4 times a day or as needed. To make a salt-water mixture, completely dissolve -1 tsp of salt in 1 cup of warm water.  If the air in your home is dry, use a humidifier to add moisture to the air.  Use a saline spray or container (neti pot) to flush out the nose (nasal irrigation). These methods can help clear away mucus and keep the nasal passages moist. General instructions  Take over-the-counter and prescription medicines only as told by your health care provider.  Follow instructions from your health care provider about eating or drinking restrictions. You may need to avoid caffeine.  Avoid things that you know you are allergic to (allergens), like dust, mold, pollen, pets, or certain foods.  Drink enough fluid to keep your urine pale yellow.  Keep all follow-up visits as told by your health care provider. This is important. Contact a health care provider if:  You have a fever.  You have a sore throat.  You have difficulty swallowing.  You have headache.  You have sinus pain.  You have a cough that  does not go away.  The mucus from your nose becomes thick and is green or yellow in color.  You have cold or flu symptoms that last more than 10 days. Summary  Postnasal drip is the feeling of mucus going down the back of your throat.  If your health care provider approves, use nasal irrigation or a nasal spray 2?4 times a day.  Avoid things that you know you are allergic to (allergens), like dust, mold, pollen, pets, or certain foods. This information is not intended to replace advice given to you by your health care provider. Make sure you discuss any questions you have with your health care provider. Document Released: 02/14/2017 Document Revised: 02/14/2017 Document Reviewed: 02/14/2017 Elsevier Interactive Patient Education  2019 [employer].

## 2019-03-02 NOTE — Progress Notes (Signed)
Medicine attending: Medical history, presenting problems, physical findings, and medications, reviewed with resident physician Dr Justin Helberg on the day of the patient visit and I concur with his evaluation and management plan. 

## 2019-03-02 NOTE — Assessment & Plan Note (Signed)
HPI: Presented to clinic to discuss her blood pressure. She states that over the past couple days who's been elevated compared to normal. She has been taking her furosemide 20 mg and lisinopril 10 mg daily. Carvedilol 3.125 mg BID is listed on her medication list but she has not been taking this. She states that she was unaware she was supposed to be on this medication.  A/P: - Continue Furosemide 20 mg and Lisinopril 10 mg  - Restart Carvedilol 3.125 mg BID

## 2019-03-02 NOTE — Telephone Encounter (Signed)
She has hx of schizoaffective disorder on multiple psych meds. Sometimes she does not take them. May be having depression component causing her to not feel well.

## 2019-03-02 NOTE — Assessment & Plan Note (Signed)
HPI: Patient with known allergic rhinitis. States that during high pollen times she gets increased nasal congestion and cough. She currently feels the mucus is dripping on the back of her throat causing her to cough. She denies fevers, chills, nausea/vomiting, shortness of breath, sick contact.  A/P: - Will continue to use her Zyrtec and Flonase - Given further at home instructions on how to decrease congestion

## 2019-03-02 NOTE — Progress Notes (Signed)
   CC: HTN  HPI:  [name] is a 48 y.o. female with PMHx listed below presenting for HTN. Please see the A&P for the status of the patient's chronic medical problems.  Past Medical History:  Diagnosis Date  . Anemia   . Congenital cerebral palsy ([place])   . Depression   . 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 Endometriosis   . Hearing impairment   . Heart murmur    at birth  . Hypertension   . Neuromuscular disorder ([place])    Cerebral palsy- very mild left side of brain  . Schizoaffective disorder ([place])    Review of Systems:  Performed and all others negative.  Physical Exam: Vitals:   03/02/19 1448  BP: (!) 135/94  Pulse: (!) 115  Temp: 98.4 F (36.9 C)  TempSrc: Oral  SpO2: 99%  Weight: 228 lb 4.8 oz (103.6 kg)   General: Obese female in no acute distress Pulm: Good air movement with no wheezing or crackles  CV: RRR, no murmurs, no rubs   Assessment & Plan:   See Encounters Tab for problem based charting.  Patient discussed with Dr. Beryle Beams

## 2019-04-11 ENCOUNTER — Other Ambulatory Visit: Payer: Self-pay

## 2019-04-11 ENCOUNTER — Encounter: Payer: Self-pay | Admitting: Internal Medicine

## 2019-04-11 ENCOUNTER — Ambulatory Visit (INDEPENDENT_AMBULATORY_CARE_PROVIDER_SITE_OTHER): Payer: Medicare Other | Admitting: Internal Medicine

## 2019-04-11 DIAGNOSIS — I10 Essential (primary) hypertension: Secondary | ICD-10-CM | POA: Diagnosis not present

## 2019-04-11 DIAGNOSIS — E119 Type 2 diabetes mellitus without complications: Secondary | ICD-10-CM

## 2019-04-11 DIAGNOSIS — R600 Localized edema: Secondary | ICD-10-CM | POA: Diagnosis not present

## 2019-04-11 DIAGNOSIS — Z79899 Other long term (current) drug therapy: Secondary | ICD-10-CM | POA: Diagnosis not present

## 2019-04-11 DIAGNOSIS — R609 Edema, unspecified: Secondary | ICD-10-CM

## 2019-04-11 DIAGNOSIS — E0800 Diabetes mellitus due to underlying condition with hyperosmolarity without nonketotic hyperglycemic-hyperosmolar coma (NKHHC): Secondary | ICD-10-CM

## 2019-04-11 MED ORDER — GLUCOSE BLOOD VI STRP: ORAL_STRIP | 5 refills | Status: DC

## 2019-04-11 MED ORDER — FUROSEMIDE 20 MG PO TABS: 20.0000 mg | ORAL_TABLET | Freq: Every day | ORAL | 2 refills | Status: DC | PRN

## 2019-04-11 MED ORDER — LISINOPRIL 10 MG PO TABS: 10.0000 mg | ORAL_TABLET | Freq: Every day | ORAL | 2 refills | Status: DC

## 2019-04-11 MED ORDER — METFORMIN HCL 1000 MG PO TABS: ORAL_TABLET | ORAL | 3 refills | Status: DC

## 2019-04-11 MED ORDER — ACCU-CHEK FASTCLIX LANCETS MISC: 5 refills | Status: DC

## 2019-04-11 MED ORDER — CARVEDILOL 3.125 MG PO TABS: ORAL_TABLET | ORAL | 3 refills | Status: DC

## 2019-04-11 NOTE — Progress Notes (Signed)
Internal Medicine Clinic Attending  Case discussed with Dr. Amin at the time of the visit.  We reviewed the resident's history and exam and pertinent patient test results.  I agree with the assessment, diagnosis, and plan of care documented in the resident's note.    

## 2019-04-11 NOTE — Progress Notes (Signed)
   CC: Elevated blood pressure at home.  HPI:  [name] is a 48 y.o. with past medical history as listed below came to the clinic with complaint of elevated blood pressure at home.  Per patient whenever her mother checked her blood pressure at home it remained between 025-427 systolic. Denies any chest pain, exertional dyspnea, palpitations, orthopnea or PND.  Having intermittent lower extremity edema. No fever or chills or sick contacts.  Please see assessment and plan for her chronic conditions.  Past Medical History:  Diagnosis Date  . Anemia   . Congenital cerebral palsy ([place])   . Depression   . 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 Endometriosis   . Hearing impairment   . Heart murmur    at birth  . Hypertension   . Neuromuscular disorder ([place])    Cerebral palsy- very mild left side of brain  . Schizoaffective disorder ([place])    Review of Systems: Negative except mentioned in HPI.  Physical Exam:  Vitals:   04/11/19 0910  BP: 136/89  Pulse: 94  Temp: 98.9 F (37.2 C)  TempSrc: Oral  SpO2: 100%  Weight: 233 lb 6.4 oz (105.9 kg)  Height: 5\' 4"  (1.626 m)    General: Vital signs reviewed.  Patient is well-developed and obese, in no acute distress and cooperative with exam.  Head: Normocephalic and atraumatic. Eyes: EOMI, conjunctivae normal, no scleral icterus.  Neck: Supple, trachea midline, normal ROM, no JVD, Cardiovascular: RRR, S1 normal, S2 normal, no murmurs, gallops, or rubs. Pulmonary/Chest: Clear to auscultation bilaterally, no wheezes, rales, or rhonchi. Abdominal: Soft, non-tender, non-distended, BS +,  Extremities: 1+ lower extremity edema bilaterally,  pulses symmetric and intact bilaterally.  Skin: Warm, dry and intact. No rashes or erythema. Psychiatric: Normal mood and affect.  Assessment & Plan:   See Encounters Tab for problem based charting.  Patient discussed with Dr. Angelia Mould.

## 2019-04-11 NOTE — Assessment & Plan Note (Signed)
1+ pitting edema of lower extremities. Is a gradual increase in her weight. No other sign of CHF exacerbation.  -Restarted Lasix 20 mg daily. -Will during next follow-up visit.

## 2019-04-11 NOTE — Assessment & Plan Note (Signed)
BP Readings from Last 3 Encounters:  04/11/19 136/89  03/02/19 (!) 135/94  11/30/18 (!) 134/93   Patient did bring all her medications in the back, there was no carvedilol or Lasix and that back.  Per patient these are the only medications she take it and was not aware of any other medication. She has similar problem during previous clinic visit and was asked to restart taking carvedilol. [place] is closed so she cannot get more medications at this time.  -Recent all her medications to [place] at [facility]. -Patient will continue with lisinopril 10 mg daily. -We will reassess during next follow-up visit in a month once she restart her Lasix and carvedilol. -If needed we will increase the dose of lisinopril.

## 2019-04-11 NOTE — Patient Instructions (Signed)
Thank you for visiting clinic today. As we discussed you are not taking all of your medications which will help with your blood pressure.  You were supposed to take carvedilol 3.125 mg twice daily and a water pill called Lasix 20 mg daily in the morning.  I am sending new prescription to your pharmacy [facility] as [place] is closed.  Please pick up your prescription from there and start taking them as directed. Please follow-up in 1 month with your PCP for your blood pressure check.

## 2019-05-29 ENCOUNTER — Ambulatory Visit (INDEPENDENT_AMBULATORY_CARE_PROVIDER_SITE_OTHER): Payer: Medicare Other | Admitting: Internal Medicine

## 2019-05-29 ENCOUNTER — Other Ambulatory Visit: Payer: Self-pay

## 2019-05-29 ENCOUNTER — Encounter: Payer: Medicare Other | Admitting: Internal Medicine

## 2019-05-29 ENCOUNTER — Telehealth: Payer: Self-pay | Admitting: Internal Medicine

## 2019-05-29 ENCOUNTER — Encounter: Payer: Self-pay | Admitting: Internal Medicine

## 2019-05-29 VITALS — BP 133/84 | HR 112 | Temp 99.1°F | Ht 64.0 in | Wt 227.6 lb

## 2019-05-29 DIAGNOSIS — F259 Schizoaffective disorder, unspecified: Secondary | ICD-10-CM | POA: Diagnosis not present

## 2019-05-29 DIAGNOSIS — M7989 Other specified soft tissue disorders: Secondary | ICD-10-CM

## 2019-05-29 DIAGNOSIS — I10 Essential (primary) hypertension: Secondary | ICD-10-CM

## 2019-05-29 DIAGNOSIS — Z7984 Long term (current) use of oral hypoglycemic drugs: Secondary | ICD-10-CM

## 2019-05-29 DIAGNOSIS — Z79899 Other long term (current) drug therapy: Secondary | ICD-10-CM | POA: Diagnosis not present

## 2019-05-29 DIAGNOSIS — E119 Type 2 diabetes mellitus without complications: Secondary | ICD-10-CM | POA: Diagnosis not present

## 2019-05-29 DIAGNOSIS — E041 Nontoxic single thyroid nodule: Secondary | ICD-10-CM

## 2019-05-29 LAB — POCT GLYCOSYLATED HEMOGLOBIN (HGB A1C): Hemoglobin A1C: 8 % — AB (ref 4.0–5.6)

## 2019-05-29 LAB — GLUCOSE, CAPILLARY: Glucose-Capillary: 237 mg/dL — ABNORMAL HIGH (ref 70–99)

## 2019-05-29 MED ORDER — CANAGLIFLOZIN 300 MG PO TABS: 300.0000 mg | ORAL_TABLET | Freq: Every day | ORAL | 0 refills | Status: DC

## 2019-05-29 MED ORDER — LOSARTAN POTASSIUM 50 MG PO TABS: 50.0000 mg | ORAL_TABLET | Freq: Every day | ORAL | 0 refills | Status: DC

## 2019-05-29 NOTE — Progress Notes (Signed)
CC: Hypertension  HPI: [name] is a 48 y.o. F w/ PMH of schizoaffective disorder, HTN, and T2DM presenting to the clinic for management of her chronic conditions.  She states that she feels well but she has noticed that her blood pressure has been elevated at home.  She states he brought her a personal blood pressure cuff recently and has been measuring at home with fluctuating readings ranging from systolic above 151V to 616W.  He states she has been taking all of her medications as prescribed but she did not bring her medication back with her today.  She denies any chest pain palpitations dyspnea headache.  She states that she ran out of her furosemide 20 mg and has been noticing increasing lower extremity edema.  Denies any fevers chills orthopnea.  Past Medical History:  Diagnosis Date  . Anemia   . Congenital cerebral palsy ([place])   . Depression   . Diabetes mellitus due to abnormal insulin ([place])   . Edema 06/10/2017  . Endometriosis   . Hearing impairment   . Heart murmur    at birth  . Hypertension   . Neuromuscular disorder ([place])    Cerebral palsy- very mild left side of brain  . Schizoaffective disorder ([place])   . Thyroid nodule 07/30/2014   CT scan from 08/27/2010: A 1.9 cm calcified nodule involving the lower pole of the right lobe of the thyroid gland. US Neck 11/06/14: Bilateral nodules. Dominant right lower pole nodule measures 2.3 cm. Findings meet consensus criteria for biopsy. Ultrasound-guided fine needle aspiration should be considered Aspiration pathology: benign follicular nodule   Review of Systems: Review of Systems  Constitutional: Negative for chills, fever and malaise/fatigue.  Respiratory: Negative for shortness of breath.   Cardiovascular: Positive for leg swelling. Negative for chest pain and palpitations.  Gastrointestinal: Negative for constipation, diarrhea, nausea and vomiting.  Neurological: Negative for dizziness and headaches.  All other  systems reviewed and are negative.   Physical Exam: Vitals:   05/29/19 1523  BP: 133/84  Pulse: (!) 112  Temp: 99.1 F (37.3 C)  TempSrc: Oral  SpO2: 100%  Weight: 227 lb 9.6 oz (103.2 kg)  Height: 5\' 4"  (1.626 m)   Physical Exam  Constitutional: She appears well-developed and well-nourished. No distress.  Eyes: Conjunctivae are normal.  Cardiovascular: Normal rate, regular rhythm, normal heart sounds and intact distal pulses.  No murmur heard. Respiratory: Effort normal and breath sounds normal. She has no wheezes. She has no rales.  GI: Soft. Bowel sounds are normal. She exhibits no distension. There is no abdominal tenderness.  Musculoskeletal: Normal range of motion.        General: Edema (minimal edema on bilateral ankles) present.  Skin: Skin is warm and dry.    Assessment & Plan:   Type 2 diabetes mellitus ([place]) [name]'s personal glucometer reviewed. #32 readings in 3 month period. Tested mostly in morning and evenings.The average reading was 193, % time in target was 46.9, % time below target was 0, and % time above target was. 53.1. Denies any polyuria, polyphagia, polydipsia. Hgb a1c this visit worsening from 7.1->8.0. On metformin and victoza currently. Intervention will be to start SGLT-2 inhibitor for both her diabetes and edema.  - Start canagliflozin 300mg  daily - C/w metformin, Victoza - F/u in 3 months    Hypertension BP Readings from Last 3 Encounters:  05/29/19 133/84  04/11/19 136/89  03/02/19 (!) 135/94   Presents with complaints of hypertension with BP >  140 on home readings. Discussed better blood pressure recorded at clinic and provided instructions on proper blood pressure measurements at home. She mentions that she was endorsing dry coughs on lisinopril which was prescribed at her last visit and she has not discontinued her lisinopril.  - Start losartan 50mg  daily - C/w carvedilol - BMP today - Can also start amlodipine if bp continues  to be elevated    Patient discussed with Dr. Rebeca Alert   -Gilberto Better, PGY2 [facility] Pager: [phone]

## 2019-05-29 NOTE — Assessment & Plan Note (Addendum)
[name]'s personal glucometer reviewed. #32 readings in 3 month period. Tested mostly in morning and evenings.The average reading was 193, % time in target was 46.9, % time below target was 0, and % time above target was. 53.1. Denies any polyuria, polyphagia, polydipsia. Hgb a1c this visit worsening from 7.1->8.0. On metformin and victoza currently. Intervention will be to start SGLT-2 inhibitor for both her diabetes and edema.  - Start canagliflozin 300mg  daily - C/w metformin, Victoza - F/u in 3 months

## 2019-05-29 NOTE — Telephone Encounter (Signed)
Called and left voicemail on home regarding missed appointment with callback number.

## 2019-05-29 NOTE — Patient Instructions (Signed)
Thank you for allowing [place] to provide your care today. Today we discussed your blood pressure and leg swelling    I have ordered bmp labs for you. I will call if any are abnormal.    Today we made the following changes to your medications.    Start losartan 50 mg daily Start Canagliflozin 100mg  daily  Please follow-up in 1 month.    Should you have any questions or concerns please call the internal medicine clinic at [phone].     Diabetes Mellitus and Nutrition, Adult When you have diabetes (diabetes mellitus), it is very important to have healthy eating habits because your blood sugar (glucose) levels are greatly affected by what you eat and drink. Eating healthy foods in the appropriate amounts, at about the same times every day, can help you:  Control your blood glucose.  Lower your risk of heart disease.  Improve your blood pressure.  Reach or maintain a healthy weight. Every person with diabetes is different, and each person has different needs for a meal plan. Your health care provider may recommend that you work with a diet and nutrition specialist (dietitian) to make a meal plan that is best for you. Your meal plan may vary depending on factors such as:  The calories you need.  The medicines you take.  Your weight.  Your blood glucose, blood pressure, and cholesterol levels.  Your activity level.  Other health conditions you have, such as heart or kidney disease. How do carbohydrates affect me? Carbohydrates, also called carbs, affect your blood glucose level more than any other type of food. Eating carbs naturally raises the amount of glucose in your blood. Carb counting is a method for keeping track of how many carbs you eat. Counting carbs is important to keep your blood glucose at a healthy level, especially if you use insulin or take certain oral diabetes medicines. It is important to know how many carbs you can safely have in each meal. This is different for  every person. Your dietitian can help you calculate how many carbs you should have at each meal and for each snack. Foods that contain carbs include:  Bread, cereal, rice, pasta, and crackers.  Potatoes and corn.  Peas, beans, and lentils.  Milk and yogurt.  Fruit and juice.  Desserts, such as cakes, cookies, ice cream, and candy. How does alcohol affect me? Alcohol can cause a sudden decrease in blood glucose (hypoglycemia), especially if you use insulin or take certain oral diabetes medicines. Hypoglycemia can be a life-threatening condition. Symptoms of hypoglycemia (sleepiness, dizziness, and confusion) are similar to symptoms of having too much alcohol. If your health care provider says that alcohol is safe for you, follow these guidelines:  Limit alcohol intake to no more than 1 drink per day for nonpregnant women and 2 drinks per day for men. One drink equals 12 oz of beer, 5 oz of wine, or 1 oz of hard liquor.  Do not drink on an empty stomach.  Keep yourself hydrated with water, diet soda, or unsweetened iced tea.  Keep in mind that regular soda, juice, and other mixers may contain a lot of sugar and must be counted as carbs. What are tips for following this plan?  Reading food labels  Start by checking the serving size on the "Nutrition Facts" label of packaged foods and drinks. The amount of calories, carbs, fats, and other nutrients listed on the label is based on one serving of the item. Many items  contain more than one serving per package.  Check the total grams (g) of carbs in one serving. You can calculate the number of servings of carbs in one serving by dividing the total carbs by 15. For example, if a food has 30 g of total carbs, it would be equal to 2 servings of carbs.  Check the number of grams (g) of saturated and trans fats in one serving. Choose foods that have low or no amount of these fats.  Check the number of milligrams (mg) of salt (sodium) in one  serving. Most people should limit total sodium intake to less than 2,300 mg per day.  Always check the nutrition information of foods labeled as "low-fat" or "nonfat". These foods may be higher in added sugar or refined carbs and should be avoided.  Talk to your dietitian to identify your daily goals for nutrients listed on the label. Shopping  Avoid buying canned, premade, or processed foods. These foods tend to be high in fat, sodium, and added sugar.  Shop around the outside edge of the grocery store. This includes fresh fruits and vegetables, bulk grains, fresh meats, and fresh dairy. Cooking  Use low-heat cooking methods, such as baking, instead of high-heat cooking methods like deep frying.  Cook using healthy oils, such as olive, canola, or sunflower oil.  Avoid cooking with butter, cream, or high-fat meats. Meal planning  Eat meals and snacks regularly, preferably at the same times every day. Avoid going long periods of time without eating.  Eat foods high in fiber, such as fresh fruits, vegetables, beans, and whole grains. Talk to your dietitian about how many servings of carbs you can eat at each meal.  Eat 4-6 ounces (oz) of lean protein each day, such as lean meat, chicken, fish, eggs, or tofu. One oz of lean protein is equal to: ? 1 oz of meat, chicken, or fish. ? 1 egg. ?  cup of tofu.  Eat some foods each day that contain healthy fats, such as avocado, nuts, seeds, and fish. Lifestyle  Check your blood glucose regularly.  Exercise regularly as told by your health care provider. This may include: ? 150 minutes of moderate-intensity or vigorous-intensity exercise each week. This could be brisk walking, biking, or water aerobics. ? Stretching and doing strength exercises, such as yoga or weightlifting, at least 2 times a week.  Take medicines as told by your health care provider.  Do not use any products that contain nicotine or tobacco, such as cigarettes and  e-cigarettes. If you need help quitting, ask your health care provider.  Work with a Social worker or diabetes educator to identify strategies to manage stress and any emotional and social challenges. Questions to ask a health care provider  Do I need to meet with a diabetes educator?  Do I need to meet with a dietitian?  What number can I call if I have questions?  When are the best times to check my blood glucose? Where to find more information:  American Diabetes Association: diabetes.org  Academy of Nutrition and Dietetics: www.eatright.[employer] of Diabetes and Digestive and Kidney Diseases (NIH): [employer] Summary  A healthy meal plan will help you control your blood glucose and maintain a healthy lifestyle.  Working with a diet and nutrition specialist (dietitian) can help you make a meal plan that is best for you.  Keep in mind that carbohydrates (carbs) and alcohol have immediate effects on your blood glucose levels. It is important to  count carbs and to use alcohol carefully. This information is not intended to replace advice given to you by your health care provider. Make sure you discuss any questions you have with your health care provider. Document Released: 07/29/2005 Document Revised: 10/14/2017 Document Reviewed: 12/06/2016 Elsevier Patient Education  2020 [employer].

## 2019-05-30 ENCOUNTER — Encounter: Payer: Self-pay | Admitting: Internal Medicine

## 2019-05-30 ENCOUNTER — Other Ambulatory Visit: Payer: Self-pay

## 2019-05-30 LAB — BMP8+ANION GAP
Anion Gap: 17 mmol/L (ref 10.0–18.0)
BUN/Creatinine Ratio: 10 (ref 9–23)
BUN: 6 mg/dL (ref 6–24)
CO2: 23 mmol/L (ref 20–29)
Calcium: 9.6 mg/dL (ref 8.7–10.2)
Chloride: 98 mmol/L (ref 96–106)
Creatinine, Ser: 0.62 mg/dL (ref 0.57–1.00)
GFR calc Af Amer: 126 mL/min/{1.73_m2} (ref >59)
GFR calc non Af Amer: 109 mL/min/{1.73_m2} (ref >59)
Glucose: 214 mg/dL — ABNORMAL HIGH (ref 65–99)
Potassium: 3.7 mmol/L (ref 3.5–5.2)
Sodium: 138 mmol/L (ref 134–144)

## 2019-05-30 NOTE — Telephone Encounter (Signed)
RX for losartan and invokana sent to [facility] yesterday. Confirmed with Pharmacist patient picked up both these Rxs today. Hubbard Hartshorn, RN, BSN

## 2019-05-30 NOTE — Telephone Encounter (Signed)
Pt states Dr. Truman Hayward supposed to send a Rx to her pharmacy yesterday, there is no medicine at the pharmacy. Please call pt back.

## 2019-05-30 NOTE — Assessment & Plan Note (Signed)
BP Readings from Last 3 Encounters:  05/29/19 133/84  04/11/19 136/89  03/02/19 (!) 135/94   Presents with complaints of hypertension with BP >140 on home readings. Discussed better blood pressure recorded at clinic and provided instructions on proper blood pressure measurements at home. She mentions that she was endorsing dry coughs on lisinopril which was prescribed at her last visit and she has not discontinued her lisinopril.  - Start losartan 50mg  daily - C/w carvedilol - BMP today - Can also start amlodipine if bp continues to be elevated

## 2019-05-31 NOTE — Progress Notes (Signed)
Internal Medicine Clinic Attending  Case discussed with Dr. Lee at the time of the visit.  We reviewed the resident's history and exam and pertinent patient test results.  I agree with the assessment, diagnosis, and plan of care documented in the resident's note.  Alexander Raines, M.D., Ph.D.  

## 2019-06-19 ENCOUNTER — Encounter: Payer: Medicare Other | Admitting: Internal Medicine

## 2019-08-02 ENCOUNTER — Other Ambulatory Visit (HOSPITAL_COMMUNITY)
Admission: RE | Admit: 2019-08-02 | Discharge: 2019-08-02 | Disposition: A | Discharge status: Home / Self Care | Payer: Medicare Other | Source: Ambulatory Visit | Attending: Internal Medicine | Admitting: Internal Medicine

## 2019-08-02 ENCOUNTER — Other Ambulatory Visit: Payer: Self-pay | Admitting: Internal Medicine

## 2019-08-02 ENCOUNTER — Other Ambulatory Visit: Payer: Self-pay

## 2019-08-02 ENCOUNTER — Ambulatory Visit (INDEPENDENT_AMBULATORY_CARE_PROVIDER_SITE_OTHER): Payer: Medicare Other | Admitting: Internal Medicine

## 2019-08-02 VITALS — BP 128/94 | HR 93 | Ht 64.0 in | Wt 223.5 lb

## 2019-08-02 DIAGNOSIS — Z114 Encounter for screening for human immunodeficiency virus [HIV]: Secondary | ICD-10-CM

## 2019-08-02 DIAGNOSIS — N898 Other specified noninflammatory disorders of vagina: Secondary | ICD-10-CM

## 2019-08-02 DIAGNOSIS — K625 Hemorrhage of anus and rectum: Secondary | ICD-10-CM | POA: Insufficient documentation

## 2019-08-02 DIAGNOSIS — I10 Essential (primary) hypertension: Secondary | ICD-10-CM | POA: Diagnosis not present

## 2019-08-02 DIAGNOSIS — E119 Type 2 diabetes mellitus without complications: Secondary | ICD-10-CM

## 2019-08-02 LAB — POCT GLYCOSYLATED HEMOGLOBIN (HGB A1C): Hemoglobin A1C: 7.7 % — AB (ref 4.0–5.6)

## 2019-08-02 LAB — GLUCOSE, CAPILLARY: Glucose-Capillary: 97 mg/dL (ref 70–99)

## 2019-08-02 MED ORDER — CARVEDILOL 3.125 MG PO TABS: ORAL_TABLET | ORAL | 3 refills | Status: DC

## 2019-08-02 NOTE — Patient Instructions (Addendum)
It was a pleasure to see you today [name]. Please make the following changes:  For your hypertension:  Please start taking carvedilol 3.125mg  twice daily   For your diabetes:  Please continue taking invokana and metformin   For your vaginal discharge:  We did a pelvic exam and collected a vaginal swab which I will send off for testing  I have also checked you for HIV  For your rectal bleeding:  I did a rectal exam and did not feel any hemorrhoids or masses. I will refer you to gastroenterology to get this evaluated.   If you have any questions or concerns, please call our clinic at [phone] between 9am-5pm and after hours call [phone] and ask for the internal medicine resident on call. If you feel you are having a medical emergency please call 911.   Thank you, we look forward to help you remain healthy!  Lars Mage, MD Internal Medicine PGY3

## 2019-08-02 NOTE — Assessment & Plan Note (Signed)
The patient has an a1c of 7.7. She is currently being prescribed metformin 1000mg  bid and invokana 300mg  qd. The patient also has victoza listed on her med list, but she is not taking it.   Assessment and plan  The patient may have missed some doses of invokana as she has having vaginal discharge. Encouraged patient to be adherent to invokana and metformin. Will follow up in 4 weeks.

## 2019-08-02 NOTE — Assessment & Plan Note (Signed)
  Patient states that she has been noticing bright red blood per her rectum over the past few months. She has noticed a quarter sized amount of blood on her panties and in the toilet bowel.   On physical exam, the patient had a 1 cm in size skin tear near the anal verge. No anal mass palpable.  Assessment and plan  The patient's rectal tear cannot explain the quantity of brbpr.   -refer to GI to consider need for sigmoidoscopy given brbpr in a patient between age of 40-69yrs.

## 2019-08-02 NOTE — Progress Notes (Signed)
   CC: Vaginal discharge  HPI:  [name] is a 48 y.o. female living with hypertension, osa, diabetes mellitus type 2, schizoaffective disorder who presents for vaginal yeast infection. Please see problem based charting for evaluation, assessment, and plan.  Past Medical History:  Diagnosis Date  . Anemia   . Congenital cerebral palsy ([place])   . Depression   . Diabetes mellitus due to abnormal insulin ([place])   . Edema 06/10/2017  . Endometriosis   . Hearing impairment   . Heart murmur    at birth  . Hypertension   . Neuromuscular disorder ([place])    Cerebral palsy- very mild left side of brain  . Schizoaffective disorder ([place])   . Thyroid nodule 07/30/2014   CT scan from 08/27/2010: A 1.9 cm calcified nodule involving the lower pole of the right lobe of the thyroid gland. US Neck 11/06/14: Bilateral nodules. Dominant right lower pole nodule measures 2.3 cm. Findings meet consensus criteria for biopsy. Ultrasound-guided fine needle aspiration should be considered Aspiration pathology: benign follicular nodule   Review of Systems:    Review of Systems  Constitutional: Negative for chills and fever.  Respiratory: Negative for cough and shortness of breath.   Cardiovascular: Negative for chest pain.  Gastrointestinal: Negative for abdominal pain, constipation, nausea and vomiting.  Neurological: Negative for dizziness and headaches.   Physical Exam:  Vitals:   08/02/19 1411  BP: (!) 128/94  Pulse: 93  SpO2: 100%  Weight: 223 lb 8 oz (101.4 kg)  Height: 5\' 4"  (1.626 m)   Physical Exam  Constitutional: She is oriented to person, place, and time. She appears well-developed and well-nourished. No distress.  HENT:  Head: Normocephalic and atraumatic.  Eyes: Conjunctivae are normal.  Cardiovascular: Normal rate, regular rhythm and normal heart sounds.  Respiratory: Effort normal and breath sounds normal. No respiratory distress. She has no wheezes.  GI: Soft. Bowel sounds  are normal. She exhibits no distension. There is no abdominal tenderness.  Genitourinary: Rectum:     Guaiac result negative.     Anal fissure present.     No rectal mass, tenderness, external hemorrhoid, internal hemorrhoid or abnormal anal tone.  There is no rash, tenderness or lesion on the right labia. There is no rash, tenderness or lesion on the left labia. Cervix exhibits no motion tenderness and no friability.    Vaginal discharge (white colored) present.   Musculoskeletal:        General: No edema.  Neurological: She is alert and oriented to person, place, and time.  Skin: She is not diaphoretic.  Psychiatric: Her speech is normal. Thought content normal. Her affect is blunt and labile. She is slowed.    Assessment & Plan:   See Encounters Tab for problem based charting.  Patient discussed with Dr. Evette Doffing

## 2019-08-02 NOTE — Assessment & Plan Note (Signed)
The patient states that she has had numerous episodes of white colored vaginal discharge over the past 2 months since she started taking invokana. She describes the vaginal discharge as white in color and not foul smelling.   On physical exam the patient had marked white vaginal discharge that was seen even prior to insering the speculum in the vaginal canal.   Assessment and plan  Will send wet prep to assess for gonorrhea, chlamydia, bv, and candida. The patient likely had candida. Given that the patient is also on invokana, if the patient continues to have recurring vaginal candidiasis then may consider switching the patient off of the medication.

## 2019-08-02 NOTE — Assessment & Plan Note (Signed)
The patient's blood pressure during this visit is 128/94. She is supposed to be on losartan 50mg  qd and carvedilol 3.125mg  bid. However, the patient states that she has not been taking carvedilol.   Assessment and plan  Carvedilol was prescribed by previous pcp due to suspicion of mild heart failure. The patient's blood pressure is not at goal.   -continue losartan 50mg  qd  -restart carvedilol 3.125mg  bid

## 2019-08-03 LAB — HIV ANTIBODY (ROUTINE TESTING W REFLEX): HIV Screen 4th Generation wRfx: NONREACTIVE

## 2019-08-03 NOTE — Progress Notes (Signed)
Internal Medicine Clinic Attending  Case discussed with Dr. Chundi at the time of the visit.  We reviewed the resident's history and exam and pertinent patient test results.  I agree with the assessment, diagnosis, and plan of care documented in the resident's note. 

## 2019-08-06 LAB — CERVICOVAGINAL ANCILLARY ONLY
Bacterial Vaginitis (gardnerella): POSITIVE — AB
Candida Glabrata: NEGATIVE
Candida Vaginitis: POSITIVE — AB
Molecular Disclaimer: NEGATIVE
Molecular Disclaimer: NEGATIVE
Molecular Disclaimer: NEGATIVE
Molecular Disclaimer: NORMAL
Trichomonas: NEGATIVE

## 2019-08-07 LAB — CERVICOVAGINAL ANCILLARY ONLY
Chlamydia: NEGATIVE
Neisseria Gonorrhea: NEGATIVE

## 2019-08-09 MED ORDER — METRONIDAZOLE 500 MG PO TABS: 500.0000 mg | ORAL_TABLET | Freq: Two times a day (BID) | ORAL | 0 refills | Status: AC

## 2019-08-09 MED ORDER — FLUCONAZOLE 150 MG PO TABS: ORAL_TABLET | ORAL | 0 refills | Status: DC

## 2019-08-09 NOTE — Addendum Note (Signed)
Addended by: Lars Mage on: 08/09/2019 10:57 AM   Modules accepted: Orders

## 2019-08-16 ENCOUNTER — Ambulatory Visit (INDEPENDENT_AMBULATORY_CARE_PROVIDER_SITE_OTHER): Payer: Medicare Other | Admitting: Internal Medicine

## 2019-08-16 ENCOUNTER — Other Ambulatory Visit: Payer: Self-pay

## 2019-08-16 ENCOUNTER — Encounter: Payer: Self-pay | Admitting: Internal Medicine

## 2019-08-16 VITALS — BP 127/86 | HR 82 | Temp 98.4°F | Wt 222.0 lb

## 2019-08-16 DIAGNOSIS — N760 Acute vaginitis: Secondary | ICD-10-CM

## 2019-08-16 DIAGNOSIS — I10 Essential (primary) hypertension: Secondary | ICD-10-CM | POA: Diagnosis not present

## 2019-08-16 DIAGNOSIS — B373 Candidiasis of vulva and vagina: Secondary | ICD-10-CM | POA: Diagnosis not present

## 2019-08-16 DIAGNOSIS — Z79899 Other long term (current) drug therapy: Secondary | ICD-10-CM

## 2019-08-16 DIAGNOSIS — N898 Other specified noninflammatory disorders of vagina: Secondary | ICD-10-CM

## 2019-08-16 NOTE — Assessment & Plan Note (Signed)
  Patient seen for vaginal discharge on previous visit. Gonorrhea and chlamydia negative. Patient with bacterial vaginosis and candida infection. Reports candida infection is better today and she is almost finished with fluconazole prescription.  - resolved, recommend follow-up if symptoms return

## 2019-08-16 NOTE — Patient Instructions (Addendum)
[name],  Today we discussed the following:  Blood pressure - You stopped taking Losartan and have continued to take your Coreg, BP is good today.  - Continue taking Coreg  Vaginal discharge - you were found to have a yeast infection - Finish fluconazole prescription  Have a good day,  Tamsen Snider , MD

## 2019-08-16 NOTE — Progress Notes (Signed)
   CC: Vaginal discharge and hypertension  HPI:  [name] is a 48 y.o. who has PMhx below. Today she is here for follow-up of vaginal discharge and hypertension. Please see problem base charting for evaluation ,assessment , and plan.  Past Medical History:  Diagnosis Date  . Anemia   . Congenital cerebral palsy ([place])   . Depression   . Diabetes mellitus due to abnormal insulin ([place])   . Edema 06/10/2017  . Endometriosis   . Hearing impairment   . Heart murmur    at birth  . Hypertension   . Neuromuscular disorder ([place])    Cerebral palsy- very mild left side of brain  . Schizoaffective disorder ([place])   . Thyroid nodule 07/30/2014   CT scan from 08/27/2010: A 1.9 cm calcified nodule involving the lower pole of the right lobe of the thyroid gland. US Neck 11/06/14: Bilateral nodules. Dominant right lower pole nodule measures 2.3 cm. Findings meet consensus criteria for biopsy. Ultrasound-guided fine needle aspiration should be considered Aspiration pathology: benign follicular nodule   Review of Systems:  Completed and otherwise negative unless mention in problem base charting.   Physical Exam:  Vitals:   08/16/19 0924  BP: 127/86  Pulse: 82  Temp: 98.4 F (36.9 C)  TempSrc: Oral  SpO2: 100%  Weight: 222 lb (100.7 kg)   Physical Exam Constitutional:      Appearance: Normal appearance. She is obese.  Cardiovascular:     Rate and Rhythm: Normal rate and regular rhythm.  Pulmonary:     Breath sounds: No wheezing, rhonchi or rales.  Skin:    General: Skin is warm and dry.  Neurological:     Mental Status: She is alert.      Assessment & Plan:   See Encounters Tab for problem based charting.  Patient seen with Dr. Evette Doffing

## 2019-08-16 NOTE — Assessment & Plan Note (Signed)
Patient reports she is not taking losartan because it makes her cough. She is taking Coreg, prescribed for suspicion of heart failure. BP 127/86  - Stop losartan 50 mg qd - Continue carvedilol 3.125mg  bid - Recommended outpatient monitoring and follow-up as needed

## 2019-08-27 NOTE — Progress Notes (Signed)
Internal Medicine Clinic Attending  I saw and evaluated the patient.  I personally confirmed the key portions of the history and exam documented by Dr. Steen and I reviewed pertinent patient test results.  The assessment, diagnosis, and plan were formulated together and I agree with the documentation in the resident's note.     

## 2019-09-07 ENCOUNTER — Other Ambulatory Visit: Payer: Self-pay | Admitting: Internal Medicine

## 2019-09-07 DIAGNOSIS — E0800 Diabetes mellitus due to underlying condition with hyperosmolarity without nonketotic hyperglycemic-hyperosmolar coma (NKHHC): Secondary | ICD-10-CM

## 2019-09-07 DIAGNOSIS — E119 Type 2 diabetes mellitus without complications: Secondary | ICD-10-CM

## 2019-09-18 ENCOUNTER — Other Ambulatory Visit: Payer: Self-pay | Admitting: Internal Medicine

## 2019-09-18 DIAGNOSIS — I10 Essential (primary) hypertension: Secondary | ICD-10-CM

## 2020-01-11 ENCOUNTER — Other Ambulatory Visit: Payer: Self-pay | Admitting: Internal Medicine

## 2020-01-11 DIAGNOSIS — E119 Type 2 diabetes mellitus without complications: Secondary | ICD-10-CM

## 2020-01-14 NOTE — Telephone Encounter (Signed)
Next appt scheduled 3/5 in [place].

## 2020-01-18 ENCOUNTER — Encounter: Payer: Self-pay | Admitting: Internal Medicine

## 2020-01-18 ENCOUNTER — Ambulatory Visit (INDEPENDENT_AMBULATORY_CARE_PROVIDER_SITE_OTHER): Payer: Medicare Other | Admitting: Internal Medicine

## 2020-01-18 ENCOUNTER — Other Ambulatory Visit: Payer: Self-pay

## 2020-01-18 VITALS — BP 127/91 | HR 87 | Temp 98.0°F | Ht 64.0 in | Wt 223.6 lb

## 2020-01-18 DIAGNOSIS — N898 Other specified noninflammatory disorders of vagina: Secondary | ICD-10-CM

## 2020-01-18 DIAGNOSIS — E119 Type 2 diabetes mellitus without complications: Secondary | ICD-10-CM

## 2020-01-18 MED ORDER — LIRAGLUTIDE 18 MG/3ML ~~LOC~~ SOPN: PEN_INJECTOR | SUBCUTANEOUS | 3 refills | Status: DC

## 2020-01-18 MED ORDER — FLUCONAZOLE 150 MG PO TABS: ORAL_TABLET | ORAL | 0 refills | Status: DC

## 2020-01-18 NOTE — Progress Notes (Signed)
   CC: Vaginal discharge  HPI:[name] is a 49 y.o. female who presents for evaluation of vaginal discharge. Please see individual problem based A/P for details.  Past Medical History:  Diagnosis Date  . Anemia   . Congenital cerebral palsy ([place])   . Depression   . Diabetes mellitus due to abnormal insulin ([place])   . Edema 06/10/2017  . Endometriosis   . Hearing impairment   . Heart murmur    at birth  . Hypertension   . Neuromuscular disorder ([place])    Cerebral palsy- very mild left side of brain  . Schizoaffective disorder ([place])   . Thyroid nodule 07/30/2014   CT scan from 08/27/2010: A 1.9 cm calcified nodule involving the lower pole of the right lobe of the thyroid gland. US Neck 11/06/14: Bilateral nodules. Dominant right lower pole nodule measures 2.3 cm. Findings meet consensus criteria for biopsy. Ultrasound-guided fine needle aspiration should be considered Aspiration pathology: benign follicular nodule   Review of Systems: ROS negative except as per HPI.  Physical Exam: Vitals:   01/18/20 0957  BP: (!) 127/91  Pulse: 87  Temp: 98 F (36.7 C)  TempSrc: Oral  SpO2: 100%  Weight: 223 lb 9.6 oz (101.4 kg)  Height: 5\' 4"  (1.626 m)    General: Alert, nl appearance HE: Normocephalic, atraumatic , EOMI, Conjunctivae normal ENT: No congestion, no rhinorrhea moist, no exudate or erythema  Cardiovascular: Normal rate, regular rhythm.  No murmurs, rubs, or gallops Pulmonary : Effort normal, breath sounds normal. No wheezes, rales, or rhonchi Abdominal: soft, nontender,  bowel sounds normal Musculoskeletal: no swelling , deformity, injury ,or tenderness in extremities, Skin: Warm, dry , no bruising, erythema, or rash Psychiatric/Behavioral:  normal mood, normal behavior    Assessment & Plan:   See Encounters Tab for problem based charting.  Patient discussed with Dr. Lynnae January

## 2020-01-18 NOTE — Patient Instructions (Addendum)
Thank you for trusting me with your care. To recap, today we discussed the following:   White vaginal discharge Prescribed fluconazole,  you should take this medication 1 pill every 3 days.  If your symptoms do not resolve please schedule an appointment.   Type 2 diabetes We will stop your Invokona today because it is causing you side effects.  I restarted your Victoza, please take this medication at the 0.6 mg daily dose for 1 week.  If you do not have any GI side effects you can increase the dose to 1.2 daily.   My best,  Tamsen Snider, MD

## 2020-01-18 NOTE — Progress Notes (Signed)
Internal Medicine Clinic Attending ° °Case discussed with Dr. Steen  at the time of the visit.  We reviewed the resident’s history and exam and pertinent patient test results.  I agree with the assessment, diagnosis, and plan of care documented in the resident’s note.  °

## 2020-01-18 NOTE — Assessment & Plan Note (Signed)
Vaginal discharge: Patient presents with recurrence of vaginal discharge. Seen in clinic a few months ago and tested positive for Candida vaginitis and bacterial vaginitis. At this time she was having white discharge and had a noticeable odor. She was treated with improvement. These vaginal infections started with Invokana she reports. We are adjusting her diabetic regimen today. We will treat for Candida vaginitis, most likely reinfection given patient is only experiencing the white discharge without odor. Patient given instructions to return for visit if she does not improved with taking the prescribed medication. If patient symptoms do not resolve she will need a vaginal exam and further testing.  Plan: Diflucan 150 mg every 72 hours x 3 doses

## 2020-01-18 NOTE — Assessment & Plan Note (Signed)
  Type 2 diabetes: Patient's A1c 07/2019 was 7.7. Goal of less than 7 . Currently patient is on Metformin 1000 mg tablet twice daily and has stopped taking her prescribed Invokana 300 mg. Patient was previously on Victoza, but appears she stopped taking per notes. Discussed stopping Invokana because she is having recurrent vaginal infections. Also restarting Victoza, patient is agreeable. Discussed starting dose and monitoring for GI symptoms for 1 week before increasing dose to 1.2 mg.  Plan:  -Stop Invokana -Start Victoza 0.6 mg for 1 week, patient give instructions to monitor for GI symptoms. After 1 week she will increase her dose to 1.2 mg if she is not experiencing symptoms -Check A1c on next visit in 3 months.

## 2020-02-05 ENCOUNTER — Ambulatory Visit (INDEPENDENT_AMBULATORY_CARE_PROVIDER_SITE_OTHER): Payer: Medicare Other | Admitting: Internal Medicine

## 2020-02-05 VITALS — BP 153/99 | HR 92 | Temp 98.5°F | Ht 64.0 in | Wt 223.1 lb

## 2020-02-05 DIAGNOSIS — M25512 Pain in left shoulder: Secondary | ICD-10-CM | POA: Diagnosis not present

## 2020-02-05 MED ORDER — MELOXICAM 15 MG PO TABS: 15.0000 mg | ORAL_TABLET | Freq: Every day | ORAL | 0 refills | Status: DC

## 2020-02-05 NOTE — Assessment & Plan Note (Addendum)
Patient presents with 3 days of left shoulder pain. She states there was no inciting and injury and she woke up with the pain. She reports pain in her anterior shoulder with just about any movement of her shoulder. She has tried only tylenol at home with minimal relief.  ROM limited by pain on exam, which also limits most special tests. TTP at anterior shoulder and biceps tendon. Her nontraumatic etiology and pain with movement in multiple [place] points towards bursitis, but she also has some tenderness of her biceps tendon.  Will treat with NSAIDs and refer to sports medicine for re-evaluation, likely [facility] ultrasound, and possible injection. We are unable to perform injection today in clinic and I believe she may benefit from the [facility] ultrasound to better characterize her problem. - Mobic 15mg  Daily for 2 weeks, then PRN, #30 - Referral to Sports Medicine

## 2020-02-05 NOTE — Progress Notes (Signed)
   CC: Left Shoulder Pain  HPI:  [name] is a 49 y.o. F with PMHx listed below presenting for Left Shoulder Pain. Please see the A&P for the status of the patient's chronic medical problems.   Past Medical History:  Diagnosis Date  . Anemia   . Congenital cerebral palsy ([place])   . Depression   . Diabetes mellitus due to abnormal insulin ([place])   . Edema 06/10/2017  . Endometriosis   . Hearing impairment   . Heart murmur    at birth  . Hypertension   . Neuromuscular disorder ([place])    Cerebral palsy- very mild left side of brain  . Schizoaffective disorder ([place])   . Thyroid nodule 07/30/2014   CT scan from 08/27/2010: A 1.9 cm calcified nodule involving the lower pole of the right lobe of the thyroid gland. US Neck 11/06/14: Bilateral nodules. Dominant right lower pole nodule measures 2.3 cm. Findings meet consensus criteria for biopsy. Ultrasound-guided fine needle aspiration should be considered Aspiration pathology: benign follicular nodule   Review of Systems:  Performed and all others negative.  Physical Exam:  Vitals:   02/05/20 1501  BP: (!) 149/103  Pulse: 97  Temp: 98.5 F (36.9 C)  TempSrc: Oral  SpO2: 100%  Weight: 223 lb 1.6 oz (101.2 kg)  Height: 5\' 4"  (1.626 m)   Physical Exam Constitutional:      General: She is not in acute distress.    Appearance: Normal appearance. She is obese.  Cardiovascular:     Rate and Rhythm: Normal rate and regular rhythm.     Pulses: Normal pulses.     Heart sounds: Normal heart sounds.  Pulmonary:     Effort: Pulmonary effort is normal. No respiratory distress.     Breath sounds: Normal breath sounds.  Abdominal:     General: Bowel sounds are normal. There is no distension.     Palpations: Abdomen is soft.     Tenderness: There is no abdominal tenderness.  Musculoskeletal:     Comments: L Shoulder: No obvious deformity or asymmetry. No bruising. No swelling TTP at Left anterior shoulder. Not tender to  palpation over posterior or lateral shoulder. TTP over biceps tendon. ROM limited by pain to 45-55 in flexion, abduction. Able to reach 70 degrees with passive ROM NV intact distally Special Tests: All special test limited by pain. Mildly positive Yergasons.  Skin:    General: Skin is warm and dry.  Neurological:     General: No focal deficit present.     Mental Status: Mental status is at baseline.    Assessment & Plan:   See Encounters Tab for problem based charting.  Patient discussed with Dr. Rebeca Alert

## 2020-02-05 NOTE — Patient Instructions (Addendum)
Thank you for allowing [place] to care for you  For your shoulder pain - This may be due to inflammation of a fluid filled sack (bursa) that lubricate your joints - There may also be inflammation of one of your tendons - We will treat with anti-inflammatories: Mobic 15mg  Daily for 14 days, then as needed - Avoid painful activities for now  - We will refer you to see sports medicine for re-evaluation and likely ultrasound exam to better characterize your injury - They may offer and injection at that visit for pain relief - You will be contacted to schedule this appointment  Follow up with PCP in about 1 month

## 2020-02-06 NOTE — Progress Notes (Signed)
Internal Medicine Clinic Attending  Case discussed with Dr. Melvin at the time of the visit.  We reviewed the resident's history and exam and pertinent patient test results.  I agree with the assessment, diagnosis, and plan of care documented in the resident's note.  Nahlia Hellmann, M.D., Ph.D.  

## 2020-02-12 ENCOUNTER — Encounter: Payer: Self-pay | Admitting: *Deleted

## 2020-03-12 NOTE — Addendum Note (Signed)
Addended by: Hulan Fray on: 03/12/2020 05:42 PM   Modules accepted: Orders

## 2020-04-30 ENCOUNTER — Telehealth: Payer: Self-pay | Admitting: *Deleted

## 2020-04-30 NOTE — Telephone Encounter (Signed)
BP 155-198/ 107-135

## 2020-05-01 ENCOUNTER — Telehealth: Payer: Self-pay | Admitting: Dietician

## 2020-05-01 ENCOUNTER — Other Ambulatory Visit: Payer: Self-pay

## 2020-05-01 ENCOUNTER — Ambulatory Visit (INDEPENDENT_AMBULATORY_CARE_PROVIDER_SITE_OTHER): Payer: Medicare Other | Admitting: Internal Medicine

## 2020-05-01 ENCOUNTER — Encounter: Payer: Self-pay | Admitting: Internal Medicine

## 2020-05-01 VITALS — BP 140/92 | HR 87 | Temp 99.0°F | Ht 64.0 in | Wt 223.0 lb

## 2020-05-01 DIAGNOSIS — E119 Type 2 diabetes mellitus without complications: Secondary | ICD-10-CM | POA: Diagnosis not present

## 2020-05-01 DIAGNOSIS — I10 Essential (primary) hypertension: Secondary | ICD-10-CM

## 2020-05-01 LAB — POCT GLYCOSYLATED HEMOGLOBIN (HGB A1C): Hemoglobin A1C: 9.3 % — AB (ref 4.0–5.6)

## 2020-05-01 LAB — GLUCOSE, CAPILLARY: Glucose-Capillary: 282 mg/dL — ABNORMAL HIGH (ref 70–99)

## 2020-05-01 MED ORDER — CARVEDILOL 6.25 MG PO TABS: ORAL_TABLET | ORAL | 3 refills | Status: DC

## 2020-05-01 NOTE — Progress Notes (Signed)
   CC: HTN  HPI: [name] is a 49 y.o. with a PMHx as noted below who presents for HTN follow up. Please refer to the problem based charting for further details.  Past Medical History:  Diagnosis Date  . Anemia   . Congenital cerebral palsy ([place])   . Depression   . Diabetes mellitus due to abnormal insulin ([place])   . Edema 06/10/2017  . Endometriosis   . Hearing impairment   . Heart murmur    at birth  . Hypertension   . Neuromuscular disorder ([place])    Cerebral palsy- very mild left side of brain  . Schizoaffective disorder ([place])   . Thyroid nodule 07/30/2014   CT scan from 08/27/2010: A 1.9 cm calcified nodule involving the lower pole of the right lobe of the thyroid gland. US Neck 11/06/14: Bilateral nodules. Dominant right lower pole nodule measures 2.3 cm. Findings meet consensus criteria for biopsy. Ultrasound-guided fine needle aspiration should be considered Aspiration pathology: benign follicular nodule   Review of Systems:  Negative unless mentioned in the HPI  Physical Exam:  Vitals:   05/01/20 0850 05/01/20 0925  BP: (!) 149/98 (!) 140/92  Pulse: 94 87  Temp: 99 F (37.2 C)   TempSrc: Oral   SpO2: 100%   Weight: 223 lb (101.2 kg)   Height: 5\' 4"  (1.626 m)    Physical Exam Constitutional:      General: She is not in acute distress.    Appearance: Normal appearance. She is obese. She is not ill-appearing or toxic-appearing.  Cardiovascular:     Rate and Rhythm: Normal rate and regular rhythm.     Pulses: Normal pulses.     Heart sounds: Normal heart sounds. No murmur heard.  No friction rub. No gallop.   Pulmonary:     Effort: Pulmonary effort is normal. No respiratory distress.     Breath sounds: Normal breath sounds. No wheezing or rales.  Musculoskeletal:     Right lower leg: No edema.     Left lower leg: No edema.  Skin:    General: Skin is warm.  Neurological:     General: No focal deficit present.     Mental Status: She is alert.    Psychiatric:        Mood and Affect: Mood normal.    Assessment & Plan:   See Encounters Tab for problem based charting.  Patient discussed with Dr. Philipp Ovens

## 2020-05-01 NOTE — Telephone Encounter (Addendum)
Per Dr. Zenia Resides office patient has had no recent eye exam there.

## 2020-05-01 NOTE — Patient Instructions (Addendum)
Thank you for visiting [place] in clinic today.  Below is a summary of what we discussed:  1.  Hypertension -Take of Coreg 6.25 mg twice a day -We will recheck your blood pressure at your next visit in 1 month  2. Diabetes -Continue taking Victoza once a week and Metformin 1000 mg twice a day.  We will check your A1c again in 3 months.  3.  Follow-up -Please schedule follow-up appointment to see your PCP in 1 month  In the meantime, if you have any questions or concerns, please feel free to reach out to [place].  Take care of Ms. Owens Shark!

## 2020-05-01 NOTE — Assessment & Plan Note (Signed)
Last A1c was 7.7 nine months ago. Pt currently takes Victoza once a week and metformin 1000mg  BID  Plan: -A1c today

## 2020-05-01 NOTE — Progress Notes (Signed)
Internal Medicine Clinic Attending  Case discussed with Dr. Alexander at the time of the visit.  We reviewed the resident's history and exam and pertinent patient test results.  I agree with the assessment, diagnosis, and plan of care documented in the resident's note.  

## 2020-05-01 NOTE — Assessment & Plan Note (Addendum)
Patient has been taking Coreg 3.125 mg twice daily to manage her hypertension.  Pt was previously on Lostartan as well but this caused a cough so she stopped taking it back in October 2020. The patient has a blood pressure cuff at home and measures her blood pressure on a daily basis.  Over the past week she has noticed elevated blood pressure numbers in the 150s and as high as 198. Today, her BP was 148.98 and 140/92.   Plan: -Increase Coreg to 6.25 BID -BMP today  -Measure BP and record readings in a journal -F/u in 4 weeks

## 2020-05-02 LAB — LIPID PANEL
Chol/HDL Ratio: 3.5 ratio (ref 0.0–4.4)
Cholesterol, Total: 134 mg/dL (ref 100–199)
HDL: 38 mg/dL — ABNORMAL LOW (ref >39)
LDL Chol Calc (NIH): 81 mg/dL (ref 0–99)
Triglycerides: 74 mg/dL (ref 0–149)
VLDL Cholesterol Cal: 15 mg/dL (ref 5–40)

## 2020-05-02 LAB — BMP8+ANION GAP
Anion Gap: 16 mmol/L (ref 10.0–18.0)
BUN/Creatinine Ratio: 11 (ref 9–23)
BUN: 7 mg/dL (ref 6–24)
CO2: 24 mmol/L (ref 20–29)
Calcium: 9.6 mg/dL (ref 8.7–10.2)
Chloride: 97 mmol/L (ref 96–106)
Creatinine, Ser: 0.65 mg/dL (ref 0.57–1.00)
GFR calc Af Amer: 123 mL/min/{1.73_m2} (ref >59)
GFR calc non Af Amer: 107 mL/min/{1.73_m2} (ref >59)
Glucose: 283 mg/dL — ABNORMAL HIGH (ref 65–99)
Potassium: 4.3 mmol/L (ref 3.5–5.2)
Sodium: 137 mmol/L (ref 134–144)

## 2020-05-30 ENCOUNTER — Ambulatory Visit (INDEPENDENT_AMBULATORY_CARE_PROVIDER_SITE_OTHER): Payer: Medicare Other | Admitting: Internal Medicine

## 2020-05-30 ENCOUNTER — Other Ambulatory Visit: Payer: Self-pay

## 2020-05-30 ENCOUNTER — Other Ambulatory Visit: Payer: Self-pay | Admitting: Internal Medicine

## 2020-05-30 ENCOUNTER — Encounter: Payer: Self-pay | Admitting: Internal Medicine

## 2020-05-30 VITALS — BP 152/78 | HR 96 | Temp 99.2°F | Ht 64.0 in | Wt 225.2 lb

## 2020-05-30 DIAGNOSIS — E119 Type 2 diabetes mellitus without complications: Secondary | ICD-10-CM | POA: Diagnosis not present

## 2020-05-30 DIAGNOSIS — I10 Essential (primary) hypertension: Secondary | ICD-10-CM

## 2020-05-30 DIAGNOSIS — E0800 Diabetes mellitus due to underlying condition with hyperosmolarity without nonketotic hyperglycemic-hyperosmolar coma (NKHHC): Secondary | ICD-10-CM

## 2020-05-30 DIAGNOSIS — F259 Schizoaffective disorder, unspecified: Secondary | ICD-10-CM | POA: Diagnosis not present

## 2020-05-30 MED ORDER — ACCU-CHEK GUIDE VI STRP: ORAL_STRIP | 5 refills | Status: DC

## 2020-05-30 MED ORDER — ACCU-CHEK FASTCLIX LANCETS MISC: 5 refills | Status: DC

## 2020-05-30 MED ORDER — ATORVASTATIN CALCIUM 20 MG PO TABS: 20.0000 mg | ORAL_TABLET | Freq: Every day | ORAL | 3 refills | Status: DC

## 2020-05-30 MED ORDER — LIRAGLUTIDE 18 MG/3ML ~~LOC~~ SOPN: 1.8000 mg | PEN_INJECTOR | SUBCUTANEOUS | 3 refills | Status: DC

## 2020-05-30 MED ORDER — METFORMIN HCL 1000 MG PO TABS: ORAL_TABLET | ORAL | 3 refills | Status: DC

## 2020-05-30 MED ORDER — AMLODIPINE BESYLATE 5 MG PO TABS: 5.0000 mg | ORAL_TABLET | Freq: Every day | ORAL | 3 refills | Status: DC

## 2020-05-30 NOTE — Assessment & Plan Note (Addendum)
BP Readings from Last 3 Encounters:  05/30/20 (!) 152/78  05/01/20 (!) 140/92  02/05/20 (!) 153/99   [name] is a 49 yo F w/ PMH of HTN, T2DM, HLD, Schizoaffective disorder who presents for f/u visit for hypertension. She was previously on carvedilol and losartan but losartan was d/ced due to cough. Since then she has continued to take her increased dose of carvedilol but expresses concern as she has noticed elevated blood pressure readings with her home bp cuff. She states her blood pressure numbers are in the 150s or higher. She denies any chest pain, blurry vision, dyspnea, lower extremity swelling.  A/P Continues to be uncontrolled. Currently on carvedilol 6.25mg  BID. Previously on lisinopril and losartan but unable to tolerate both due to cough. Does monitor blood pressure at home and is able to show me how she operates it at home.  - C/w carvedilol 6.25mg  BID - Start amlodipine 5mg  daily (advised to self-titrate up to 10mg  if her bp remains high) - C/w home bp monitoring

## 2020-05-30 NOTE — Progress Notes (Signed)
CC: Hypertension  HPI: Ms.Lendora Rikard is a 49 y.o. with PMH listed below presenting with complaint of hypertension. Please see problem based assessment and plan for further details.  Past Medical History:  Diagnosis Date  . Anemia   . Congenital cerebral palsy ([place])   . Depression   . Diabetes mellitus due to abnormal insulin ([place])   . Edema 06/10/2017  . Endometriosis   . Hearing impairment   . Heart murmur    at birth  . Hypertension   . Neuromuscular disorder ([place])    Cerebral palsy- very mild left side of brain  . Schizoaffective disorder ([place])   . Thyroid nodule 07/30/2014   CT scan from 08/27/2010: A 1.9 cm calcified nodule involving the lower pole of the right lobe of the thyroid gland. US Neck 11/06/14: Bilateral nodules. Dominant right lower pole nodule measures 2.3 cm. Findings meet consensus criteria for biopsy. Ultrasound-guided fine needle aspiration should be considered Aspiration pathology: benign follicular nodule   Review of Systems: Review of Systems  Constitutional: Negative for chills, fever and malaise/fatigue.  Respiratory: Negative for shortness of breath.   Cardiovascular: Negative for chest pain, palpitations and leg swelling.  Gastrointestinal: Negative for diarrhea, nausea and vomiting.  Genitourinary: Negative for dysuria.  All other systems reviewed and are negative.    Physical Exam: Vitals:   05/30/20 0912 05/30/20 0931  BP: (!) 150/92 (!) 152/78  Pulse: 96   Temp: 99.2 F (37.3 C)   TempSrc: Oral   SpO2: 100%   Weight: 225 lb 3.2 oz (102.2 kg)   Height: 5\' 4"  (1.626 m)    Gen: Well-developed, well nourished, NAD HEENT: NCAT head, hearing intact CV: RRR, S1, S2 normal Pulm: CTAB, No rales, no wheezes Extm: ROM intact, Peripheral pulses intact, No peripheral edema Skin: Dry, Warm, normal turgor  Assessment & Plan:   Hypertension BP Readings from Last 3 Encounters:  05/30/20 (!) 152/78  05/01/20 (!) 140/92  02/05/20 (!)  153/99   Ms.Cliff is a 49 yo F w/ PMH of HTN, T2DM, HLD, Schizoaffective disorder who presents for f/u visit for hypertension. She was previously on carvedilol and losartan but losartan was d/ced due to cough. Since then she has continued to take her increased dose of carvedilol but expresses concern as she has noticed elevated blood pressure readings with her home bp cuff. She states her blood pressure numbers are in the 150s or higher. She denies any chest pain, blurry vision, dyspnea, lower extremity swelling.  A/P Continues to be uncontrolled. Currently on carvedilol 6.25mg  BID. Previously on lisinopril and losartan but unable to tolerate both due to cough. Does monitor blood pressure at home and is able to show me how she operates it at home.  - C/w carvedilol 6.25mg  BID - Start amlodipine 5mg  daily (advised to self-titrate up to 10mg  if her bp remains high) - C/w home bp monitoring  Type 2 diabetes mellitus ([place]) Lab Results  Component Value Date   HGBA1C 9.3 (A) 05/01/2020   Presents for f/u. Mentions that her glucometer appears to be broken and she is unable to record blood sugar at home. Glucometer shows error message. Currently on metformin 1000mg  BID, victoza 1.2mg  weekly. Previously prescribed canagliflozin but was unable to tolerate due to recurrent yeast infections. Last hgb a1c shows worsening glycemic control. She mentions drinking soda and juice at home although she has been trying to reduce the amount.  - Increase victoza to 1.8mg  weekly - C/w metformin 1000mg  daily -  Advised to reduce carb intake  Schizoaffective disorder, chronic condition (HCC) Hx of schizoaffective disorder. Currently symptoms well controlled on monthly Invega. She mentions feeling bloated and expresses concern about weight gain after starting Invega. Advised that we can up-titrate her victoza to assist in weight loss.  - C/w Lorayne Bender    Patient discussed with Dr.Williams  -Gilberto Better, PGY3 [facility]  Health Internal Medicine Pager: [phone]

## 2020-05-30 NOTE — Assessment & Plan Note (Signed)
Hx of schizoaffective disorder. Currently symptoms well controlled on monthly Invega. She mentions feeling bloated and expresses concern about weight gain after starting Invega. Advised that we can up-titrate her victoza to assist in weight loss.  - C/w Lorayne Bender

## 2020-05-30 NOTE — Assessment & Plan Note (Signed)
Lab Results  Component Value Date   HGBA1C 9.3 (A) 05/01/2020   Presents for f/u. Mentions that her glucometer appears to be broken and she is unable to record blood sugar at home. Glucometer shows error message. Currently on metformin 1000mg  BID, victoza 1.2mg  weekly. Previously prescribed canagliflozin but was unable to tolerate due to recurrent yeast infections. Last hgb a1c shows worsening glycemic control. She mentions drinking soda and juice at home although she has been trying to reduce the amount.  - Increase victoza to 1.8mg  weekly - C/w metformin 1000mg  daily - Advised to reduce carb intake

## 2020-05-30 NOTE — Patient Instructions (Addendum)
[name],   Your eye exam with Dr. Shirley Muscat at Dr. Zenia Resides office is scheduled for :  Tuesday,  August 17th at  11 am. Their phone number is [phone].   Dear [name],  Thank you for allowing [place] to provide your care today. Today we discussed your diabetes, blood pressure and weight    I have ordered urine labs for you. I will call if any are abnormal.    Today we made the following changes to your medications:    Please start amlodipine 5mg  daily Please increase your victoza to 1.8mg  daily Please continue to take your other medications as prescribed  Please follow-up in 2 months.    Should you have any questions or concerns please call the internal medicine clinic at [phone].    Thank you for choosing [facility].  Liraglutide injection What is this medicine? LIRAGLUTIDE (LIR a GLOO tide) is used to improve blood sugar control in adults with type 2 diabetes. This medicine may be used with other diabetes medicines. This drug may also reduce the risk of heart attack or stroke if you have type 2 diabetes and risk factors for heart disease. This medicine may be used for other purposes; ask your health care provider or pharmacist if you have questions. COMMON BRAND NAME(S): Victoza What should I tell my health care provider before I take this medicine? They need to know if you have any of these conditions:  endocrine tumors (MEN 2) or if someone in your family had these tumors  gallbladder disease  high cholesterol  history of alcohol abuse problem  history of pancreatitis  kidney disease or if you are on dialysis  liver disease  previous swelling of the tongue, face, or lips with difficulty breathing, difficulty swallowing, hoarseness, or tightening of the throat  stomach problems  thyroid cancer or if someone in your family had thyroid cancer  an unusual or allergic reaction to liraglutide, other medicines, foods, dyes, or preservatives  pregnant  or trying to get pregnant  breast-feeding How should I use this medicine? This medicine is for injection under the skin of your upper leg, stomach area, or upper arm. You will be taught how to prepare and give this medicine. Use exactly as directed. Take your medicine at regular intervals. Do not take it more often than directed. It is important that you put your used needles and syringes in a special sharps container. Do not put them in a trash can. If you do not have a sharps container, call your pharmacist or healthcare provider to get one [place] will be given to you by the pharmacist with each prescription and refill. Be sure to read this information carefully each time. This drug comes with INSTRUCTIONS FOR USE. Ask your pharmacist for directions on how to use this drug. Read the information carefully. Talk to your pharmacist or health care provider if you have questions. Talk to your pediatrician regarding the use of this medicine in children. While this drug may be prescribed for children as young as 84 years of age, precautions do apply. Overdosage: If you think you have taken too much of this medicine contact a poison control center or emergency room at once. NOTE: This medicine is only for you. Do not share this medicine with others. What if I miss a dose? If you miss a dose, take it as soon as you can. If it is almost time for your next dose, take only that dose. Do not take  double or extra doses. What may interact with this medicine?  other medicines for diabetes Many medications may cause changes in blood sugar, these include:  alcohol containing beverages  antiviral medicines for HIV or AIDS  aspirin and aspirin-like drugs  certain medicines for blood pressure, heart disease, irregular heart beat  chromium  diuretics  female hormones, such as estrogens or progestins, birth control pills  fenofibrate  gemfibrozil  isoniazid  lanreotide  female hormones  or anabolic steroids  MAOIs like Carbex, Eldepryl, Marplan, Nardil, and Parnate  medicines for weight loss  medicines for allergies, asthma, cold, or cough  medicines for depression, anxiety, or psychotic disturbances  niacin  nicotine  NSAIDs, medicines for pain and inflammation, like ibuprofen or naproxen  octreotide  pasireotide  pentamidine  phenytoin  probenecid  quinolone antibiotics such as ciprofloxacin, levofloxacin, ofloxacin  some herbal dietary supplements  steroid medicines such as prednisone or cortisone  sulfamethoxazole; trimethoprim  thyroid hormones Some medications can hide the warning symptoms of low blood sugar (hypoglycemia). You may need to monitor your blood sugar more closely if you are taking one of these medications. These include:  beta-blockers, often used for high blood pressure or heart problems (examples include atenolol, metoprolol, propranolol)  clonidine  guanethidine  reserpine This list may not describe all possible interactions. Give your health care provider a list of all the medicines, herbs, non-prescription drugs, or dietary supplements you use. Also tell them if you smoke, drink alcohol, or use illegal drugs. Some items may interact with your medicine. What should I watch for while using this medicine? Visit your doctor or health care professional for regular checks on your progress. Drink plenty of fluids while taking this medicine. Check with your doctor or health care professional if you get an attack of severe diarrhea, nausea, and vomiting. The loss of too much body fluid can make it dangerous for you to take this medicine. A test called the HbA1C (A1C) will be monitored. This is a simple blood test. It measures your blood sugar control over the last 2 to 3 months. You will receive this test every 3 to 6 months. Learn how to check your blood sugar. Learn the symptoms of low and high blood sugar and how to manage  them. Always carry a quick-source of sugar with you in case you have symptoms of low blood sugar. Examples include hard sugar candy or glucose tablets. Make sure others know that you can choke if you eat or drink when you develop serious symptoms of low blood sugar, such as seizures or unconsciousness. They must get medical help at once. Tell your doctor or health care professional if you have high blood sugar. You might need to change the dose of your medicine. If you are sick or exercising more than usual, you might need to change the dose of your medicine. Do not skip meals. Ask your doctor or health care professional if you should avoid alcohol. Many nonprescription cough and cold products contain sugar or alcohol. These can affect blood sugar. Pens should never be shared. Even if the needle is changed, sharing may result in passing of viruses like hepatitis or HIV. Wear a medical ID bracelet or chain, and carry a card that describes your disease and details of your medicine and dosage times. What side effects may I notice from receiving this medicine? Side effects that you should report to your doctor or health care professional as soon as possible:  allergic reactions like skin rash,  itching or hives, swelling of the face, lips, or tongue  breathing problems  diarrhea that continues or is severe  lump or swelling on the neck  severe nausea  signs and symptoms of infection like fever or chills; cough; sore throat; pain or trouble passing urine  signs and symptoms of low blood sugar such as feeling anxious, confusion, dizziness, increased hunger, unusually weak or tired, sweating, shakiness, cold, irritable, headache, blurred vision, fast heartbeat, loss of consciousness  signs and symptoms of kidney injury like trouble passing urine or change in the amount of urine  trouble swallowing  unusual stomach upset or pain  vomiting Side effects that usually do not require medical  attention (report to your doctor or health care professional if they continue or are bothersome):  constipation  decreased appetite  diarrhea  fatigue  headache  nausea  pain, redness, or irritation at site where injected  stomach upset  stuffy or runny nose This list may not describe all possible side effects. Call your doctor for medical advice about side effects. You may report side effects to FDA at 1-800-FDA-1088. Where should I keep my medicine? Keep out of the reach of children. Store unopened pen in a refrigerator between 2 and 8 degrees C (36 and 46 degrees F). Do not freeze or use if the medicine has been frozen. Protect from light and excessive heat. After you first use the pen, it can be stored at room temperature between 15 and 30 degrees C (59 and 86 degrees F) or in a refrigerator. Throw away your used pen after 30 days or after the expiration date, whichever comes first. Do not store your pen with the needle attached. If the needle is left on, medicine may leak from the pen. NOTE: This sheet is a summary. It may not cover all possible information. If you have questions about this medicine, talk to your doctor, pharmacist, or health care provider.  2020 Elsevier/Gold Standard (2019-07-17 09:39:47)

## 2020-05-31 LAB — MICROALBUMIN / CREATININE URINE RATIO
Creatinine, Urine: 124.7 mg/dL
Microalb/Creat Ratio: 20 mg/g creat (ref 0–29)
Microalbumin, Urine: 25.2 ug/mL

## 2020-06-02 NOTE — Progress Notes (Signed)
Internal Medicine Clinic Attending  Case discussed with Dr. Truman Hayward  At the time of the visit.  We reviewed the resident's history and exam and pertinent patient test results.  I agree with the assessment, diagnosis, and plan of care documented in the resident's note.  Sphenoid sinusitis could present this way, imaging to proceed.

## 2020-08-07 ENCOUNTER — Ambulatory Visit (INDEPENDENT_AMBULATORY_CARE_PROVIDER_SITE_OTHER): Payer: Medicare Other | Admitting: Internal Medicine

## 2020-08-07 ENCOUNTER — Other Ambulatory Visit: Payer: Self-pay

## 2020-08-07 ENCOUNTER — Telehealth: Payer: Self-pay | Admitting: *Deleted

## 2020-08-07 DIAGNOSIS — R05 Cough: Secondary | ICD-10-CM | POA: Diagnosis not present

## 2020-08-07 DIAGNOSIS — R059 Cough, unspecified: Secondary | ICD-10-CM

## 2020-08-07 NOTE — Telephone Encounter (Signed)
Patient called in c/o 1 day hx of cough, sneezing, N/V. States she received her second Covid vaccination last week. No one else in home is symptomatic. Advised to quarantine away from others in household, where mask and stay 6 feet apart if must be in same room. Wash hands frequently, do not share items or bathroom if able. Advised to have a covid test today. Number given for available sites [phone]). She will call now. Also, tele appt to discuss sx given for this afternoon with Red Team as there are no openings on Yellow Team. Hubbard Hartshorn, BSN, RN-BC

## 2020-08-07 NOTE — Telephone Encounter (Signed)
Thank you for letting us know.  

## 2020-08-07 NOTE — Progress Notes (Signed)
  [facility] Health Internal Medicine Residency Telephone Encounter Continuity Care Appointment  HPI:   This telephone encounter was created for [name] on 08/07/2020 for the following purpose/cc cough and nausea .   Past Medical History:  Past Medical History:  Diagnosis Date  . Anemia   . Congenital cerebral palsy ([place])   . Depression   . 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 Edema 06/10/2017  . Endometriosis   . Hearing impairment   . Heart murmur    at birth  . Hypertension   . Neuromuscular disorder ([place])    Cerebral palsy- very mild left side of brain  . Schizoaffective disorder ([place])   . Thyroid nodule 07/30/2014   CT scan from 08/27/2010: A 1.9 cm calcified nodule involving the lower pole of the right lobe of the thyroid gland. US Neck 11/06/14: Bilateral nodules. Dominant right lower pole nodule measures 2.3 cm. Findings meet consensus criteria for biopsy. Ultrasound-guided fine needle aspiration should be considered Aspiration pathology: benign follicular nodule      ROS:      Assessment / Plan / Recommendations:   Please see A&P under problem oriented charting for assessment of the patient's acute and chronic medical conditions.   As always, pt is advised that if symptoms worsen or new symptoms arise, they should go to an urgent care facility or to to ER for further evaluation.   Consent and Medical Decision Making:   Patient discussed with Dr. Dareen Piano  This is a telephone encounter between [name] and Maudie Mercury on 08/07/2020 for nausea and cough. The visit was conducted with the patient located at home and Maudie Mercury at [facility]. The patient's identity was confirmed using their DOB and current address. The patient has consented to being evaluated through a telephone encounter and understands the associated risks (an examination cannot be done and the patient may need to come in for an appointment) / benefits (allows the patient to  remain at home, decreasing exposure to coronavirus). I personally spent 13 minutes on medical discussion.

## 2020-08-08 ENCOUNTER — Encounter: Payer: Self-pay | Admitting: Internal Medicine

## 2020-08-08 MED ORDER — GUAIFENESIN ER 600 MG PO TB12
600.0000 mg | ORAL_TABLET | Freq: Two times a day (BID) | ORAL | 2 refills | Status: DC
Start: 2020-08-08 — End: 2021-02-19

## 2020-08-08 NOTE — Assessment & Plan Note (Signed)
Cough, sneezing, and weakness over the past two days. Patient states that yesterday her symptoms started. She has a productive cough with white, blood tinged sputum. She is not longer having cough today, but does feel fatigued. She denies fevers, loss of taste,smell, nausea, vomiting, abdominal pain. She did have her second COVID vaccination last Friday, so her etiology is likely not a vaccine reaction given the timing of her symptoms. Likely viral etiology, she feels better today than yesterday. Discussed that while she does have the vaccine, it takes 2 weeks after vaccination to have full effects. Discussed supportive treatment, fluids, and will add mucinex for secretions. Instructed patient that if symptoms do not improve, check with a COVID swab. If she begins to have fevers, SHOB to call the clinic or go to nearest urgent care/ED.  - Continue to monitor - Mucinex 600 mg twice daily

## 2020-08-15 NOTE — Progress Notes (Signed)
Internal Medicine Clinic Attending ? ?Case discussed with Dr. Winters  At the time of the visit.  We reviewed the resident?s history and exam and pertinent patient test results.  I agree with the assessment, diagnosis, and plan of care documented in the resident?s note.  ?

## 2020-08-19 DIAGNOSIS — H838X3 Other specified diseases of inner ear, bilateral: Secondary | ICD-10-CM | POA: Diagnosis not present

## 2020-08-19 DIAGNOSIS — H903 Sensorineural hearing loss, bilateral: Secondary | ICD-10-CM | POA: Diagnosis not present

## 2020-10-06 ENCOUNTER — Other Ambulatory Visit: Payer: Self-pay

## 2020-10-06 ENCOUNTER — Emergency Department (HOSPITAL_COMMUNITY)
Admission: EM | Admit: 2020-10-06 | Discharge: 2020-10-07 | Disposition: A | Discharge status: Home / Self Care | Payer: Medicare Other | Attending: Emergency Medicine | Admitting: Emergency Medicine

## 2020-10-06 ENCOUNTER — Emergency Department (HOSPITAL_COMMUNITY): Payer: Medicare Other

## 2020-10-06 ENCOUNTER — Ambulatory Visit (INDEPENDENT_AMBULATORY_CARE_PROVIDER_SITE_OTHER): Payer: Medicare Other | Admitting: Internal Medicine

## 2020-10-06 ENCOUNTER — Encounter (HOSPITAL_COMMUNITY): Payer: Self-pay | Admitting: Pediatrics

## 2020-10-06 DIAGNOSIS — I10 Essential (primary) hypertension: Secondary | ICD-10-CM | POA: Insufficient documentation

## 2020-10-06 DIAGNOSIS — J069 Acute upper respiratory infection, unspecified: Secondary | ICD-10-CM | POA: Diagnosis not present

## 2020-10-06 DIAGNOSIS — R000 Tachycardia, unspecified: Secondary | ICD-10-CM | POA: Diagnosis not present

## 2020-10-06 DIAGNOSIS — Z20822 Contact with and (suspected) exposure to covid-19: Secondary | ICD-10-CM | POA: Diagnosis not present

## 2020-10-06 DIAGNOSIS — Z79899 Other long term (current) drug therapy: Secondary | ICD-10-CM | POA: Diagnosis not present

## 2020-10-06 DIAGNOSIS — R059 Cough, unspecified: Secondary | ICD-10-CM | POA: Diagnosis not present

## 2020-10-06 DIAGNOSIS — Z794 Long term (current) use of insulin: Secondary | ICD-10-CM | POA: Diagnosis not present

## 2020-10-06 DIAGNOSIS — R0602 Shortness of breath: Secondary | ICD-10-CM | POA: Insufficient documentation

## 2020-10-06 DIAGNOSIS — E119 Type 2 diabetes mellitus without complications: Secondary | ICD-10-CM | POA: Insufficient documentation

## 2020-10-06 DIAGNOSIS — Z91010 Allergy to peanuts: Secondary | ICD-10-CM | POA: Insufficient documentation

## 2020-10-06 DIAGNOSIS — Z7984 Long term (current) use of oral hypoglycemic drugs: Secondary | ICD-10-CM | POA: Diagnosis not present

## 2020-10-06 DIAGNOSIS — E860 Dehydration: Secondary | ICD-10-CM

## 2020-10-06 DIAGNOSIS — B9789 Other viral agents as the cause of diseases classified elsewhere: Secondary | ICD-10-CM | POA: Diagnosis not present

## 2020-10-06 LAB — CBC WITH DIFFERENTIAL/PLATELET
Abs Immature Granulocytes: 0.02 10*3/uL (ref 0.00–0.07)
Basophils Absolute: 0 10*3/uL (ref 0.0–0.1)
Basophils Relative: 0 %
Eosinophils Absolute: 0.1 10*3/uL (ref 0.0–0.5)
Eosinophils Relative: 2 %
HCT: 39.9 % (ref 36.0–46.0)
Hemoglobin: 12.9 g/dL (ref 12.0–15.0)
Immature Granulocytes: 1 %
Lymphocytes Relative: 29 %
Lymphs Abs: 1.3 10*3/uL (ref 0.7–4.0)
MCH: 28 pg (ref 26.0–34.0)
MCHC: 32.3 g/dL (ref 30.0–36.0)
MCV: 86.7 fL (ref 80.0–100.0)
Monocytes Absolute: 0.5 10*3/uL (ref 0.1–1.0)
Monocytes Relative: 11 %
Neutro Abs: 2.4 10*3/uL (ref 1.7–7.7)
Neutrophils Relative %: 57 %
Platelets: 273 10*3/uL (ref 150–400)
RBC: 4.6 MIL/uL (ref 3.87–5.11)
RDW: 13.4 % (ref 11.5–15.5)
WBC: 4.3 10*3/uL (ref 4.0–10.5)
nRBC: 0 % (ref 0.0–0.2)

## 2020-10-06 LAB — COMPREHENSIVE METABOLIC PANEL
ALT: 21 U/L (ref 0–44)
AST: 22 U/L (ref 15–41)
Albumin: 3.7 g/dL (ref 3.5–5.0)
Alkaline Phosphatase: 65 U/L (ref 38–126)
Anion gap: 10 (ref 5–15)
BUN: <5 mg/dL — ABNORMAL LOW (ref 6–20)
CO2: 27 mmol/L (ref 22–32)
Calcium: 8.7 mg/dL — ABNORMAL LOW (ref 8.9–10.3)
Chloride: 99 mmol/L (ref 98–111)
Creatinine, Ser: 0.59 mg/dL (ref 0.44–1.00)
GFR, Estimated: >60 mL/min (ref >60)
Glucose, Bld: 224 mg/dL — ABNORMAL HIGH (ref 70–99)
Potassium: 3.2 mmol/L — ABNORMAL LOW (ref 3.5–5.1)
Sodium: 136 mmol/L (ref 135–145)
Total Bilirubin: 0.4 mg/dL (ref 0.3–1.2)
Total Protein: 7 g/dL (ref 6.5–8.1)

## 2020-10-06 LAB — RESPIRATORY PANEL BY RT PCR (FLU A&B, COVID)
Influenza A by PCR: NEGATIVE
Influenza B by PCR: NEGATIVE
SARS Coronavirus 2 by RT PCR: NEGATIVE

## 2020-10-06 MED ORDER — DEXAMETHASONE SODIUM PHOSPHATE 10 MG/ML IJ SOLN
10.0000 mg | Freq: Once | INTRAMUSCULAR | Status: AC
  Administered 2020-10-07: 10 mg via INTRAVENOUS
  Filled 2020-10-06: qty 1

## 2020-10-06 MED ORDER — ACETAMINOPHEN 325 MG PO TABS: 650.0000 mg | ORAL_TABLET | Freq: Once | ORAL | Status: DC | PRN

## 2020-10-06 MED ORDER — SODIUM CHLORIDE 0.9 % IV BOLUS
1000.0000 mL | Freq: Once | INTRAVENOUS | Status: AC
  Administered 2020-10-07: 1000 mL via INTRAVENOUS

## 2020-10-06 NOTE — ED Triage Notes (Signed)
covid exposure with a  Family member, complaint of congestion, cough and sore throat since Wednesday.

## 2020-10-06 NOTE — Assessment & Plan Note (Signed)
5-day history of viral URI symptoms, however concerning findings include acute shortness of breath with chest pain both pleuritic and at rest.  Due to this, patient was urged to call an ambulance to be evaluated in the ER.  Ms. Clinkscale is in agreement with that plan and plans to call the ambulance immediately.

## 2020-10-06 NOTE — Progress Notes (Signed)
  [facility] Health Internal Medicine Residency Telephone Encounter Continuity Care Appointment  HPI:   This telephone encounter was created for [name] on 10/06/2020 for the following purpose/cc URI symptoms.  [name] states she has been experiencing nasal congestion, sore throat, rhinorrhea, myalgias since Wednesday the 17th. She also endorses chest pain that is at rest and with cough. She is endorsing difficulty catching her breathe. She is concerned this is secondary to COVID-19 as she has had a recent exposure.    Past Medical History:  Past Medical History:  Diagnosis Date  . Anemia   . Congenital cerebral palsy ([place])   . Depression   . 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 Edema 06/10/2017  . Endometriosis   . Hearing impairment   . Heart murmur    at birth  . Hypertension   . Neuromuscular disorder ([place])    Cerebral palsy- very mild left side of brain  . Schizoaffective disorder ([place])   . Thyroid nodule 07/30/2014   CT scan from 08/27/2010: A 1.9 cm calcified nodule involving the lower pole of the right lobe of the thyroid gland. US Neck 11/06/14: Bilateral nodules. Dominant right lower pole nodule measures 2.3 cm. Findings meet consensus criteria for biopsy. Ultrasound-guided fine needle aspiration should be considered Aspiration pathology: benign follicular nodule      ROS:  As noted above in the HPI   Assessment / Plan / Recommendations:   Please see A&P under problem oriented charting for assessment of the patient's acute and chronic medical conditions.   As always, pt is advised that if symptoms worsen or new symptoms arise, they should go to an urgent care facility or to to ER for further evaluation.   Consent and Medical Decision Making:   Patient discussed with Dr. Philipp Ovens  This is a telephone encounter between [name] and Jose Persia on 10/06/2020 for URI symptoms. The visit was conducted with the patient located at home and  Jose Persia at [facility]. The patient's identity was confirmed using their DOB and current address. The patient has consented to being evaluated through a telephone encounter and understands the associated risks (an examination cannot be done and the patient may need to come in for an appointment) / benefits (allows the patient to remain at home, decreasing exposure to coronavirus). I personally spent 10 minutes on medical discussion.

## 2020-10-06 NOTE — ED Provider Notes (Signed)
[facility] EMERGENCY DEPARTMENT Provider Note   CSN: [id number] Arrival date & time: 10/06/20  1634     History Chief Complaint  Patient presents with  . Cough    [name] is a 49 y.o. female.  HPI      This is a 49 year old female with a history of COPD, diabetes, hypertension, schizoaffective disorder who presents with cough, congestion, sore throat.  Onset of symptoms Wednesday of last week.  She reports that her grandson tested positive for COVID-19.  She is fully vaccinated.  She has had some shortness of breath.  No fevers.  She states that what made her come in was that she lost her voice yesterday but that seems to have improved.  Has not had any nausea, vomiting, diarrhea, abdominal pain, chest pain.   Past Medical History:  Diagnosis Date  . Anemia   . Congenital cerebral palsy ([place])   . Depression   . Diabetes mellitus due to abnormal insulin ([place])   . Edema 06/10/2017  . Endometriosis   . Hearing impairment   . Heart murmur    at birth  . Hypertension   . Neuromuscular disorder ([place])    Cerebral palsy- very mild left side of brain  . Schizoaffective disorder ([place])   . Thyroid nodule 07/30/2014   CT scan from 08/27/2010: A 1.9 cm calcified nodule involving the lower pole of the right lobe of the thyroid gland. US Neck 11/06/14: Bilateral nodules. Dominant right lower pole nodule measures 2.3 cm. Findings meet consensus criteria for biopsy. Ultrasound-guided fine needle aspiration should be considered Aspiration pathology: benign follicular nodule    Patient Active Problem List   Diagnosis Date Noted  . Shortness of breath 10/06/2020  . Left shoulder pain 02/05/2020  . Vaginal discharge 08/02/2019  . Need for immunization against influenza 09/30/2018  . Hypertension 03/21/2018  . Allergic rhinitis 01/25/2018  . OSA (obstructive sleep apnea) 06/10/2017  . Congenital hearing disorder 12/08/2016  . Schizoaffective disorder, chronic  condition ([place]) 06/18/2016  . Cough in adult 05/21/2015  . Hyperlipidemia LDL goal <70 09/10/2014  . Obesity (BMI 30-39.9) 07/30/2014  . Healthcare maintenance 07/30/2014  . Type 2 diabetes mellitus ([place]) 11/07/2012    Past Surgical History:  Procedure Laterality Date  . CESAREAN SECTION    . EXPLORATORY LAPAROTOMY WITH ABDOMINAL MASS EXCISION    . MYOMECTOMY N/A 08/05/2015   Procedure: Abdominal Hysterectomy, Bilateral Salpingectomy;  Surgeon: Osborne Oman, MD;  Location: [facility];  Service: Gynecology;  Laterality: N/A;     OB History    Gravida  1   Para  1   Term  1   Preterm      AB      Living  1     SAB      TAB      Ectopic      Multiple      Live Births              Family History  Problem Relation Age of Onset  . Heart failure Mother   . Hypertension Mother   . Diabetes Mellitus II Mother   . Cancer Mother   . Diabetes Mellitus II Father   . Hypertension Father     Social History   Tobacco Use  . Smoking status: Never Smoker  . Smokeless tobacco: Never Used  Substance Use Topics  . Alcohol use: No  . Drug use: No    Home Medications Prior to  Admission medications   Medication Sig Start Date End Date Taking? Authorizing Provider  Accu-Chek FastClix Lancets MISC Check blood sugar two times a day 05/30/20   Mosetta Anis, MD  amLODipine (NORVASC) 5 MG tablet Take 1 tablet (5 mg total) by mouth daily. 05/30/20   Mosetta Anis, MD  atorvastatin (LIPITOR) 20 MG tablet Take 1 tablet (20 mg total) by mouth daily. 05/30/20   Mosetta Anis, MD  benztropine (COGENTIN) 0.5 MG tablet Take 0.5 mg by mouth 2 (two) times daily. MORNING (0900) AND NIGHT (2100) 11/10/16   [provider]  carvedilol (COREG) 6.25 MG tablet TAKE ONE (1) TABLET BY MOUTH TWO (2) TIMES DAILY WITH A MEAL 05/01/20   Earlene Plater, MD  cetirizine (ZYRTEC ALLERGY) 10 MG tablet Take 1 tablet (10 mg total) by mouth daily. 01/25/18   Ina Homes, MD  DULoxetine  (CYMBALTA) 30 MG capsule Take 30 mg by mouth daily. 11/12/16   [provider]  fluticasone (FLONASE) 50 MCG/ACT nasal spray Place 1 spray into both nostrils daily. 01/25/18 01/25/19  Ina Homes, MD  glucose blood (ACCU-CHEK GUIDE) test strip Check blood sugar two times a day as instructed 05/30/20   Mosetta Anis, MD  guaiFENesin (MUCINEX) 600 MG 12 hr tablet Take 1 tablet (600 mg total) by mouth 2 (two) times daily. 08/08/20 08/08/21  Maudie Mercury, MD  Insulin Pen Needle (SURE COMFORT PEN NEEDLES) 32G X 4 MM MISC USE TO INJECT INSULIN ONCE DAILY. Diagnosis code E11.21 02/06/18   Tawny Asal, MD  liraglutide (VICTOZA) 18 MG/3ML SOPN Inject 0.3 mLs (1.8 mg total) into the skin once a week. 05/30/20   Mosetta Anis, MD  metFORMIN (GLUCOPHAGE) 1000 MG tablet Take 1 tablet twice daily 05/30/20   Mosetta Anis, MD  paliperidone (INVEGA SUSTENNA) 156 MG/ML SUSP injection Inject 156 mg into the muscle every 30 (thirty) days.     [provider]    Allergies    Bactrim [sulfamethoxazole-trimethoprim], Lisinopril, Losartan, Bee pollen, Ivp dye [iodinated diagnostic agents], Metrizamide, Pollen extract, Sulfa antibiotics, Sulfasalazine, and Peanut-containing drug products  Review of Systems   Review of Systems  Constitutional: Negative for fever.  HENT: Positive for sore throat.   Respiratory: Positive for cough and shortness of breath.   Cardiovascular: Negative for chest pain.  Gastrointestinal: Negative for abdominal pain and diarrhea.  Genitourinary: Negative for dysuria.  All other systems reviewed and are negative.   Physical Exam Updated Vital Signs BP 119/81   Pulse 96   Temp 99 F (37.2 C) (Oral)   Resp (!) 22   LMP 07/23/2015 (Exact Date)   SpO2 99%   Physical Exam Vitals and nursing note reviewed.  Constitutional:      Appearance: She is well-developed. She is obese. She is not ill-appearing.  HENT:     Head: Normocephalic and atraumatic.     Right Ear:  Tympanic membrane normal.     Left Ear: Tympanic membrane normal.     Nose: Congestion present.     Mouth/Throat:     Mouth: Mucous membranes are dry.     Comments: Posterior oropharyngeal erythema with bilateral tonsillar enlargement, uvula midline, no exudate noted Eyes:     Pupils: Pupils are equal, round, and reactive to light.  Cardiovascular:     Rate and Rhythm: Regular rhythm. Tachycardia present.     Heart sounds: Normal heart sounds.  Pulmonary:     Effort: Pulmonary effort is normal. No respiratory distress.  Breath sounds: No wheezing.  Abdominal:     General: Bowel sounds are normal.     Palpations: Abdomen is soft.     Tenderness: There is no abdominal tenderness.  Musculoskeletal:     Cervical back: Neck supple.     Right lower leg: No edema.     Left lower leg: No edema.  Skin:    General: Skin is warm and dry.  Neurological:     Mental Status: She is alert and oriented to person, place, and time.  Psychiatric:        Mood and Affect: Mood normal.     ED Results / Procedures / Treatments   Labs (all labs ordered are listed, but only abnormal results are displayed) Labs Reviewed  COMPREHENSIVE METABOLIC PANEL - Abnormal; Notable for the following components:      Result Value   Potassium 3.2 (*)    Glucose, Bld 224 (*)    BUN <5 (*)    Calcium 8.7 (*)    All other components within normal limits  RESPIRATORY PANEL BY RT PCR (FLU A&B, COVID)  GROUP A STREP BY PCR  CBC WITH DIFFERENTIAL/PLATELET    EKG None  Radiology DG Chest 2 View  Result Date: 10/06/2020 CLINICAL DATA:  Cough EXAM: CHEST - 2 VIEW COMPARISON:  11/30/2016 FINDINGS: The heart size and mediastinal contours are within normal limits. Both lungs are clear. The visualized skeletal structures are unremarkable. IMPRESSION: No active cardiopulmonary disease. Electronically Signed   By: Donavan Foil M.D.   On: 10/06/2020 20:08    Procedures Procedures (including critical care  time)  Medications Ordered in ED Medications  acetaminophen (TYLENOL) tablet 650 mg (has no administration in time range)  sodium chloride 0.9 % bolus 1,000 mL (1,000 mLs Intravenous New Bag/Given 10/07/20 0007)  dexamethasone (DECADRON) injection 10 mg (10 mg Intravenous Given 10/07/20 0001)    ED Course  I have reviewed the triage vital signs and the nursing notes.  Pertinent labs & imaging results that were available during my care of the patient were reviewed by me and considered in my medical decision making (see chart for details).    MDM Rules/Calculators/A&P                          Patient presents with upper respiratory symptoms.  She is overall nontoxic and vital signs are reassuring.  She does clinically appear somewhat dry with dry mucous membranes.  She was given fluids.  Covid and flu testing are negative.  She is fully vaccinated.  Chest x-ray without pneumonia.  Given sore throat and physical exam findings, patient was given Decadron.  Strep screening was sent and is negative.  Suspect viral etiology.  Recommend supportive measures at home.  After history, exam, and medical workup I feel the patient has been appropriately medically screened and is safe for discharge home. Pertinent diagnoses were discussed with the patient. Patient was given return precautions.   Final Clinical Impression(s) / ED Diagnoses Final diagnoses:  Viral URI with cough  Dehydration    Rx / DC Orders ED Discharge Orders    None       Merryl Hacker, MD 10/07/20 [phone]

## 2020-10-07 DIAGNOSIS — J069 Acute upper respiratory infection, unspecified: Secondary | ICD-10-CM | POA: Diagnosis not present

## 2020-10-07 LAB — GROUP A STREP BY PCR: Group A Strep by PCR: NOT DETECTED

## 2020-10-07 NOTE — Discharge Instructions (Addendum)
You were seen today for upper respiratory symptoms.  Your Covid, flu, and strep testing are negative.  Make sure that you are staying hydrated.

## 2020-10-07 NOTE — Progress Notes (Signed)
Internal Medicine Clinic Attending  Case discussed with Dr. Basaraba  At the time of the visit.  We reviewed the resident's history and exam and pertinent patient test results.  I agree with the assessment, diagnosis, and plan of care documented in the resident's note.  

## 2020-10-13 ENCOUNTER — Ambulatory Visit (INDEPENDENT_AMBULATORY_CARE_PROVIDER_SITE_OTHER): Payer: Medicare Other | Admitting: Internal Medicine

## 2020-10-13 ENCOUNTER — Other Ambulatory Visit: Payer: Self-pay

## 2020-10-13 DIAGNOSIS — R059 Cough, unspecified: Secondary | ICD-10-CM

## 2020-10-13 NOTE — Assessment & Plan Note (Signed)
Patient reports productive cough for one week. She states mucus is greenish in color. She endorses nasal congestion, dry mouth and diarrhea for 2 days which has resolved. She denies fevers, chills, sore throat, sinus pain/pressure, loss in taste or smell, nausea or vomiting. Denies any recent sick contacts or travel. She was seen in the ED on 11/22 and at that time flu, covid and strep tests were negative. Chest xray was unremarkable. She states since then her symptoms have improved. She has been taking robitussin for her cough. Recommended she continue supportive care at this time with cough syrup and mucinex to help with congestion. Patient to call clinic if symptoms worsen.

## 2020-10-13 NOTE — Progress Notes (Signed)
°  [facility] Internal Medicine Residency Telephone Encounter Continuity Care Appointment  HPI:   This telephone encounter was created for [name] on 10/13/2020 for the following purpose/cc cough.   Past Medical History:  Past Medical History:  Diagnosis Date   Anemia    Congenital cerebral palsy (HCC)    Depression    Diabetes mellitus due to abnormal insulin ([place])    Edema 06/10/2017   Endometriosis    Hearing impairment    Heart murmur    at birth   Hypertension    Neuromuscular disorder ([place])    Cerebral palsy- very mild left side of brain   Schizoaffective disorder (HCC)    Thyroid nodule 07/30/2014   CT scan from 08/27/2010: A 1.9 cm calcified nodule involving the lower pole of the right lobe of the thyroid gland. US Neck 11/06/14: Bilateral nodules. Dominant right lower pole nodule measures 2.3 cm. Findings meet consensus criteria for biopsy. Ultrasound-guided fine needle aspiration should be considered Aspiration pathology: benign follicular nodule      ROS:  Review of Systems  Constitutional: Negative for chills, fever and malaise/fatigue.  HENT: Positive for congestion. Negative for sinus pain and sore throat.   Respiratory: Positive for cough and sputum production. Negative for shortness of breath and wheezing.   Cardiovascular: Negative for chest pain.  Gastrointestinal: Positive for diarrhea. Negative for abdominal pain, constipation, nausea and vomiting.  Neurological: Negative for headaches.  Endo/Heme/Allergies: Positive for environmental allergies.     Assessment / Plan / Recommendations:   Please see A&P under problem oriented charting for assessment of the patient's acute and chronic medical conditions.   As always, pt is advised that if symptoms worsen or new symptoms arise, they should go to an urgent care facility or to to ER for further evaluation.   Consent and Medical Decision Making:   Patient discussed with Dr.  Dareen Piano  This is a telephone encounter between [name] and [place] on 10/13/2020 for productive cough. The visit was conducted with the patient located at home and Tashanna Dolin N Quatisha Zylka at [facility]. The patient's identity was confirmed using their DOB and current address. The patient has consented to being evaluated through a telephone encounter and understands the associated risks (an examination cannot be done and the patient may need to come in for an appointment) / benefits (allows the patient to remain at home, decreasing exposure to coronavirus). I personally spent 9 minutes on medical discussion.

## 2020-10-15 NOTE — Addendum Note (Signed)
Addended by: Aldine Contes on: 10/15/2020 09:49 AM   Modules accepted: Level of Service

## 2020-10-15 NOTE — Progress Notes (Signed)
Internal Medicine Clinic Attending ° °Case discussed with Dr. Rehman  At the time of the visit.  We reviewed the resident’s history and exam and pertinent patient test results.  I agree with the assessment, diagnosis, and plan of care documented in the resident’s note.  ° °

## 2020-10-21 ENCOUNTER — Other Ambulatory Visit: Payer: Self-pay | Admitting: Internal Medicine

## 2020-10-21 NOTE — Telephone Encounter (Signed)
Hi Helen  Is the Clonazepam a new medication? I do not see it on her list nor the [employer]

## 2020-10-21 NOTE — Telephone Encounter (Signed)
Pt also calls this am and states she had one episode of quarter size amount of blood when she voided and wiped. Has not continued. Denies abd pain, back apin, pain when voiding, pain while voiding, bad odor to urine, h/a, urgency, increased voiding but less output. She is cautioned to call or go to urg care if any of these symptoms occur

## 2020-10-23 NOTE — Telephone Encounter (Signed)
This was an automated request from pharmacy

## 2020-11-12 ENCOUNTER — Other Ambulatory Visit (HOSPITAL_COMMUNITY)
Admission: RE | Admit: 2020-11-12 | Discharge: 2020-11-12 | Disposition: A | Discharge status: Home / Self Care | Payer: Medicare Other | Source: Ambulatory Visit | Attending: Internal Medicine | Admitting: Internal Medicine

## 2020-11-12 ENCOUNTER — Other Ambulatory Visit: Payer: Self-pay | Admitting: Internal Medicine

## 2020-11-12 ENCOUNTER — Ambulatory Visit (INDEPENDENT_AMBULATORY_CARE_PROVIDER_SITE_OTHER): Payer: Medicare Other | Admitting: Internal Medicine

## 2020-11-12 ENCOUNTER — Encounter: Payer: Self-pay | Admitting: Internal Medicine

## 2020-11-12 VITALS — BP 125/87 | HR 112 | Wt 212.6 lb

## 2020-11-12 DIAGNOSIS — B373 Candidiasis of vulva and vagina: Secondary | ICD-10-CM | POA: Diagnosis not present

## 2020-11-12 DIAGNOSIS — Z124 Encounter for screening for malignant neoplasm of cervix: Secondary | ICD-10-CM | POA: Insufficient documentation

## 2020-11-12 DIAGNOSIS — Z1151 Encounter for screening for human papillomavirus (HPV): Secondary | ICD-10-CM | POA: Insufficient documentation

## 2020-11-12 DIAGNOSIS — Z0000 Encounter for general adult medical examination without abnormal findings: Secondary | ICD-10-CM | POA: Insufficient documentation

## 2020-11-12 DIAGNOSIS — N898 Other specified noninflammatory disorders of vagina: Secondary | ICD-10-CM

## 2020-11-12 DIAGNOSIS — E119 Type 2 diabetes mellitus without complications: Secondary | ICD-10-CM | POA: Diagnosis not present

## 2020-11-12 DIAGNOSIS — Z23 Encounter for immunization: Secondary | ICD-10-CM | POA: Diagnosis not present

## 2020-11-12 LAB — POCT GLYCOSYLATED HEMOGLOBIN (HGB A1C): Hemoglobin A1C: 12.7 % — AB (ref 4.0–5.6)

## 2020-11-12 LAB — GLUCOSE, CAPILLARY: Glucose-Capillary: 288 mg/dL — ABNORMAL HIGH (ref 70–99)

## 2020-11-12 MED ORDER — LIRAGLUTIDE 18 MG/3ML ~~LOC~~ SOPN: 1.8000 mg | PEN_INJECTOR | SUBCUTANEOUS | 3 refills | Status: DC

## 2020-11-12 MED ORDER — METFORMIN HCL 1000 MG PO TABS: ORAL_TABLET | ORAL | 3 refills | Status: DC

## 2020-11-12 NOTE — Assessment & Plan Note (Signed)
Agree to receive flu shot in clinic today 

## 2020-11-12 NOTE — Assessment & Plan Note (Signed)
Presents with recurrent vaginal discharge. Mentions having white discharge with foul smell.   A/P On exam noted to have white vaginal discharge. Prior hx of BV and candida vaginitis. Wet prep collected during pap smear - F/u wet prep

## 2020-11-12 NOTE — Patient Instructions (Addendum)
Dear [name],  Thank you for allowing [place] to provide your care today. Today we discussed your diabetes    I have ordered hemoglobin a1c labs for you. I will call if any are abnormal.    Today we made the following changes to your medications:    Please make sure to take your Victoza as prescribed  Please follow-up in 3 months.    Please call the internal medicine center clinic if you have any questions or concerns, we may be able to help and keep you from a long and expensive emergency room wait. Our clinic and after hours phone number is [phone], the best time to call is Monday through Friday 9 am to 4 pm but there is always someone available 24/7 if you have an emergency. If you need medication refills please notify your pharmacy one week in advance and they will send [place] a request.    If you have not gotten the COVID vaccine, I recommend doing so:  You may get it at your local [facility] or [facility] OR To schedule an appointment for a COVID vaccine or be added to the vaccine wait list: Go to [email or web address]   OR Go to [email or web address]                  OR Call [phone]                                     OR Call [phone] and select Option 2  Thank you for choosing [facility]   Diabetes Mellitus and Nutrition, Adult When you have diabetes (diabetes mellitus), it is very important to have healthy eating habits because your blood sugar (glucose) levels are greatly affected by what you eat and drink. Eating healthy foods in the appropriate amounts, at about the same times every day, can help you:  Control your blood glucose.  Lower your risk of heart disease.  Improve your blood pressure.  Reach or maintain a healthy weight. Every person with diabetes is different, and each person has different needs for a meal plan. Your health care provider may recommend that you work with a diet and nutrition specialist (dietitian) to make a meal plan that  is best for you. Your meal plan may vary depending on factors such as:  The calories you need.  The medicines you take.  Your weight.  Your blood glucose, blood pressure, and cholesterol levels.  Your activity level.  Other health conditions you have, such as heart or kidney disease. How do carbohydrates affect me? Carbohydrates, also called carbs, affect your blood glucose level more than any other type of food. Eating carbs naturally raises the amount of glucose in your blood. Carb counting is a method for keeping track of how many carbs you eat. Counting carbs is important to keep your blood glucose at a healthy level, especially if you use insulin or take certain oral diabetes medicines. It is important to know how many carbs you can safely have in each meal. This is different for every person. Your dietitian can help you calculate how many carbs you should have at each meal and for each snack. Foods that contain carbs include:  Bread, cereal, rice, pasta, and crackers.  Potatoes and corn.  Peas, beans, and lentils.  Milk and yogurt.  Fruit and juice.  Desserts, such as cakes, cookies, ice cream, and candy. How does alcohol affect  me? Alcohol can cause a sudden decrease in blood glucose (hypoglycemia), especially if you use insulin or take certain oral diabetes medicines. Hypoglycemia can be a life-threatening condition. Symptoms of hypoglycemia (sleepiness, dizziness, and confusion) are similar to symptoms of having too much alcohol. If your health care provider says that alcohol is safe for you, follow these guidelines:  Limit alcohol intake to no more than 1 drink per day for nonpregnant women and 2 drinks per day for men. One drink equals 12 oz of beer, 5 oz of wine, or 1 oz of hard liquor.  Do not drink on an empty stomach.  Keep yourself hydrated with water, diet soda, or unsweetened iced tea.  Keep in mind that regular soda, juice, and other mixers may contain a lot  of sugar and must be counted as carbs. What are tips for following this plan?  Reading food labels  Start by checking the serving size on the "Nutrition Facts" label of packaged foods and drinks. The amount of calories, carbs, fats, and other nutrients listed on the label is based on one serving of the item. Many items contain more than one serving per package.  Check the total grams (g) of carbs in one serving. You can calculate the number of servings of carbs in one serving by dividing the total carbs by 15. For example, if a food has 30 g of total carbs, it would be equal to 2 servings of carbs.  Check the number of grams (g) of saturated and trans fats in one serving. Choose foods that have low or no amount of these fats.  Check the number of milligrams (mg) of salt (sodium) in one serving. Most people should limit total sodium intake to less than 2,300 mg per day.  Always check the nutrition information of foods labeled as "low-fat" or "nonfat". These foods may be higher in added sugar or refined carbs and should be avoided.  Talk to your dietitian to identify your daily goals for nutrients listed on the label. Shopping  Avoid buying canned, premade, or processed foods. These foods tend to be high in fat, sodium, and added sugar.  Shop around the outside edge of the grocery store. This includes fresh fruits and vegetables, bulk grains, fresh meats, and fresh dairy. Cooking  Use low-heat cooking methods, such as baking, instead of high-heat cooking methods like deep frying.  Cook using healthy oils, such as olive, canola, or sunflower oil.  Avoid cooking with butter, cream, or high-fat meats. Meal planning  Eat meals and snacks regularly, preferably at the same times every day. Avoid going long periods of time without eating.  Eat foods high in fiber, such as fresh fruits, vegetables, beans, and whole grains. Talk to your dietitian about how many servings of carbs you can eat at  each meal.  Eat 4-6 ounces (oz) of lean protein each day, such as lean meat, chicken, fish, eggs, or tofu. One oz of lean protein is equal to: ? 1 oz of meat, chicken, or fish. ? 1 egg. ?  cup of tofu.  Eat some foods each day that contain healthy fats, such as avocado, nuts, seeds, and fish. Lifestyle  Check your blood glucose regularly.  Exercise regularly as told by your health care provider. This may include: ? 150 minutes of moderate-intensity or vigorous-intensity exercise each week. This could be brisk walking, biking, or water aerobics. ? Stretching and doing strength exercises, such as yoga or weightlifting, at least 2 times a week.  Take medicines as told by your health care provider.  Do not use any products that contain nicotine or tobacco, such as cigarettes and e-cigarettes. If you need help quitting, ask your health care provider.  Work with a Veterinary surgeon or diabetes educator to identify strategies to manage stress and any emotional and social challenges. Questions to ask a health care provider  Do I need to meet with a diabetes educator?  Do I need to meet with a dietitian?  What number can I call if I have questions?  When are the best times to check my blood glucose? Where to find more information:  American Diabetes Association: diabetes.org  Academy of Nutrition and Dietetics: www.eatright.[employer] of Diabetes and Digestive and Kidney Diseases (NIH): CarFlippers.tn Summary  A healthy meal plan will help you control your blood glucose and maintain a healthy lifestyle.  Working with a diet and nutrition specialist (dietitian) can help you make a meal plan that is best for you.  Keep in mind that carbohydrates (carbs) and alcohol have immediate effects on your blood glucose levels. It is important to count carbs and to use alcohol carefully. This information is not intended to replace advice given to you by your health care provider. Make  sure you discuss any questions you have with your health care provider. Document Revised: 10/14/2017 Document Reviewed: 12/06/2016 Elsevier Patient Education  2020 [employer].

## 2020-11-12 NOTE — Assessment & Plan Note (Signed)
Due for pap smear. Agree to undergo pap smear this visit

## 2020-11-12 NOTE — Progress Notes (Signed)
CC: Diabetes  HPI: [name] is a 49 y.o. with PMH listed below presenting with complaint of diabetes. Please see problem based assessment and plan for further details.  Past Medical History:  Diagnosis Date  . Anemia   . Congenital cerebral palsy (HCC)   . Depression   . Diabetes mellitus due to abnormal insulin (HCC)   . Edema 06/10/2017  . Endometriosis   . Hearing impairment   . Heart murmur    at birth  . Hypertension   . Neuromuscular disorder (HCC)    Cerebral palsy- very mild left side of brain  . Schizoaffective disorder (HCC)   . Thyroid nodule 07/30/2014   CT scan from 08/27/2010: A 1.9 cm calcified nodule involving the lower pole of the right lobe of the thyroid gland. US Neck 11/06/14: Bilateral nodules. Dominant right lower pole nodule measures 2.3 cm. Findings meet consensus criteria for biopsy. Ultrasound-guided fine needle aspiration should be considered Aspiration pathology: benign follicular nodule    Review of Systems: Review of Systems  Constitutional: Negative for chills, fever and malaise/fatigue.  Eyes: Negative for blurred vision.  Respiratory: Negative for shortness of breath.   Cardiovascular: Negative for chest pain and palpitations.  Gastrointestinal: Negative for constipation, diarrhea, nausea and vomiting.  Genitourinary: Positive for frequency.  All other systems reviewed and are negative.    Physical Exam: Vitals:   11/12/20 1006  BP: 125/87  Pulse: (!) 112  SpO2: 99%  Weight: 212 lb 9.6 oz (96.4 kg)   Gen: Well-developed, well nourished, NAD HEENT: NCAT head, hearing intact CV: RRR, S1, S2 normal Pulm: CTAB, No rales, no wheezes Abd: NTND, BS+ GU: External exam normal, Vaginal vault with thick white discharge, Cervix intact, no drainage from os Extm: ROM intact, Peripheral pulses intact, No peripheral edema Skin: Dry, Warm, normal turgor, no wounds, no rashes, no lesions  Assessment & Plan:   Type 2 diabetes mellitus  (HCC) Lab Results  Component Value Date   HGBA1C 12.7 (A) 11/12/2020   Ms.Pro is a 49 yo F w/ PMH of T2DM, HTN, Schizoaffective disorder presenting to [facility] for management of her chronic conditions. She has been having worsening glycemic control over the last 3 months. Previously on metformin, victoza and canagliflozin. Canagliflozin discontinued due to recurrent yeast infection. She also mentions that she has been non-adherent to victoza due to lack of organization which she attributes to her psych diagnosis. She mentions continuing to have significant dietary indiscretion with consumption of large amounts of soda and sweet drinks. She mentions she has trouble with understanding why her sugars rise in setting of non-sweet foods such as bread.  A/P Present w/ worsening glycemic control. Previously on victoza and metformin with hgb a1c of 8-9 but having difficulty with adherence at home to injectables. Discussed impact of uncontrolled diabetes and need to start insulin therapy which she is resistant to. Also spoke about starting sulfonylurea but she is found to be allergic to sulfa-drugs upon clarification. Worsening glycemic control appear to be primarily driven by food indiscretion. Will make referral for medical nutrition therapy.  - Referral to MNT - Refills for victoza sent with counseling on medication adherence - C/w metformin 1000mg  BID - Need to start insulin If  hgb a1c does not improve with dietary changes  Vaginal discharge Presents with recurrent vaginal discharge. Mentions having white discharge with foul smell.   A/P On exam noted to have white vaginal discharge. Prior hx of BV and candida vaginitis. Wet prep collected during  pap smear - F/u wet prep  Need for immunization against influenza Agree to receive flu shot in clinic today  Pap smear for cervical cancer screening Due for pap smear. Agree to undergo pap smear this visit    Patient discussed with Dr.  Criselda Peaches  -Judeth Cornfield, PGY3 [facility] Internal Medicine Pager: [phone]

## 2020-11-12 NOTE — Assessment & Plan Note (Addendum)
Lab Results  Component Value Date   HGBA1C 12.7 (A) 11/12/2020   [name] is a 49 yo F w/ PMH of T2DM, HTN, Schizoaffective disorder presenting to [facility] for management of her chronic conditions. She has been having worsening glycemic control over the last 3 months. Previously on metformin, victoza and canagliflozin. Canagliflozin discontinued due to recurrent yeast infection. She also mentions that she has been non-adherent to victoza due to lack of organization which she attributes to her psych diagnosis. She mentions continuing to have significant dietary indiscretion with consumption of large amounts of soda and sweet drinks. She mentions she has trouble with understanding why her sugars rise in setting of non-sweet foods such as bread.  A/P Present w/ worsening glycemic control. Previously on victoza and metformin with hgb a1c of 8-9 but having difficulty with adherence at home to injectables. Discussed impact of uncontrolled diabetes and need to start insulin therapy which she is resistant to. Also spoke about starting sulfonylurea but she is found to be allergic to sulfa-drugs upon clarification. Worsening glycemic control appear to be primarily driven by food indiscretion. Will make referral for medical nutrition therapy.  - Referral to MNT - Refills for victoza sent with counseling on medication adherence - C/w metformin 1000mg  BID - Need to start insulin If  hgb a1c does not improve with dietary changes

## 2020-11-13 LAB — CERVICOVAGINAL ANCILLARY ONLY
Bacterial Vaginitis (gardnerella): NEGATIVE
Candida Glabrata: NEGATIVE
Candida Vaginitis: POSITIVE — AB
Chlamydia: NEGATIVE
Comment: NEGATIVE
Comment: NEGATIVE
Comment: NEGATIVE
Comment: NEGATIVE
Comment: NEGATIVE
Comment: NORMAL
Neisseria Gonorrhea: NEGATIVE
Trichomonas: NEGATIVE

## 2020-11-16 LAB — CYTOLOGY - PAP
Adequacy: ABSENT
Comment: NEGATIVE
Diagnosis: NEGATIVE
High risk HPV: NEGATIVE

## 2020-11-17 ENCOUNTER — Telehealth: Payer: Self-pay | Admitting: Internal Medicine

## 2020-11-17 ENCOUNTER — Other Ambulatory Visit: Payer: Self-pay | Admitting: Internal Medicine

## 2020-11-17 DIAGNOSIS — N898 Other specified noninflammatory disorders of vagina: Secondary | ICD-10-CM

## 2020-11-17 MED ORDER — FLUCONAZOLE 150 MG PO TABS: 150.0000 mg | ORAL_TABLET | Freq: Once | ORAL | 0 refills | Status: DC

## 2020-11-17 NOTE — Telephone Encounter (Signed)
Attempted to call [name] regarding her wet smear result. Patient did not pick up. Voicemail not set up

## 2020-11-18 ENCOUNTER — Other Ambulatory Visit: Payer: Self-pay | Admitting: Internal Medicine

## 2020-11-18 ENCOUNTER — Telehealth: Payer: Self-pay

## 2020-11-18 DIAGNOSIS — E1121 Type 2 diabetes mellitus with diabetic nephropathy: Secondary | ICD-10-CM

## 2020-11-18 DIAGNOSIS — E119 Type 2 diabetes mellitus without complications: Secondary | ICD-10-CM

## 2020-11-18 MED ORDER — LIRAGLUTIDE 18 MG/3ML ~~LOC~~ SOPN: 1.8000 mg | PEN_INJECTOR | SUBCUTANEOUS | 3 refills | Status: DC

## 2020-11-18 MED ORDER — SURE COMFORT PEN NEEDLES 32G X 4 MM MISC: 1 refills | Status: DC

## 2020-11-18 NOTE — Progress Notes (Signed)
Internal Medicine Clinic Attending  Case discussed with Dr. Lee  At the time of the visit.  We reviewed the resident's history and exam and pertinent patient test results.  I agree with the assessment, diagnosis, and plan of care documented in the resident's note.    

## 2020-11-18 NOTE — Telephone Encounter (Signed)
I'll call her back

## 2020-11-18 NOTE — Telephone Encounter (Signed)
Spoke with [name] regarding her lab results and recommendation take fluconazole. Also discussed her request for information on recommendation for Ob/gyn practiced. Provided contact information on her previous ob/gynecologist. Also requesting refills on needles and Victoza which were sent.

## 2020-11-18 NOTE — Telephone Encounter (Signed)
Requesting to speak with Dr. Nedra Hai for test results. Please call pt back.

## 2020-11-20 ENCOUNTER — Other Ambulatory Visit: Payer: Self-pay | Admitting: Internal Medicine

## 2020-11-20 DIAGNOSIS — E119 Type 2 diabetes mellitus without complications: Secondary | ICD-10-CM

## 2020-11-27 ENCOUNTER — Encounter: Payer: Self-pay | Admitting: Internal Medicine

## 2020-12-25 NOTE — Addendum Note (Signed)
Addended by: Hulan Fray on: 12/25/2020 05:52 PM   Modules accepted: Orders

## 2021-01-14 ENCOUNTER — Ambulatory Visit (INDEPENDENT_AMBULATORY_CARE_PROVIDER_SITE_OTHER): Payer: Medicare Other | Admitting: Dietician

## 2021-01-14 ENCOUNTER — Other Ambulatory Visit: Payer: Self-pay | Admitting: Dietician

## 2021-01-14 ENCOUNTER — Encounter: Payer: Self-pay | Admitting: Dietician

## 2021-01-14 DIAGNOSIS — Z713 Dietary counseling and surveillance: Secondary | ICD-10-CM | POA: Diagnosis not present

## 2021-01-14 DIAGNOSIS — E119 Type 2 diabetes mellitus without complications: Secondary | ICD-10-CM

## 2021-01-14 LAB — GLUCOSE, CAPILLARY: Glucose-Capillary: 261 mg/dL — ABNORMAL HIGH (ref 70–99)

## 2021-01-14 NOTE — Progress Notes (Signed)
Great. Thank you.

## 2021-01-14 NOTE — Patient Instructions (Addendum)
Thank you for your visit today!   You said you want to decrease the sugar in your drinks by:   1- drinking more water,  Like when you eat out   2- ADD a half a can of sugar to your TEA when you make it   3- Buy lower sugar juices like on the handout provided today.   Please get your victoza and test strips and start taking the Victoza and checking your blood sugar.   The office should call you to make an appointment:  Dr. Shirley Muscat   Located in: [facility] Address: [address], [place], [place] [address] Phone: [phone]  Butch Penny [phone]

## 2021-01-14 NOTE — Progress Notes (Addendum)
Medical Nutrition Therapy:  Appt start time: 0915 end time:  1000 Total time spent: 45 minutes Visit # 2, last visit 03/2018  Assessment:  Primary concerns today: glucose control and weight management.  Ms. Whitmoyer states she last took her victoza a month ago and last checked her blood sugar in January. She has been out of both. She lives with her 48 year old son who she says is supportive. Her sister lives 5 minutes a way. She states she does the food shopping.  She uses transportation services to get to the pharmacy.  States she eats a lot of pasta and bread and knows this is not good for her. She says she eats more salads now and attaches this to her weight loss/maintenance. States she has a large appetite starting recently. Knows that she needs to drink less soda and has recently started drinking more whereas before she says she was only drinking water.   Stays up late watching TV until 12-1 & sometimes does not get to sleep until 2-3 AM. Naps during the day (12-3)   Her goal to do that is to drink more water, eat small meals and walk for 15-30 minutes a day.   ANTHROPOMETRICS: Estimated body mass index is 37.09 kg/m as calculated from the following:   Height as of 05/30/20: 5\' 4"  (1.626 m).   Weight as of this encounter: 216 lb 1.6 oz (98 kg).  Wt Readings from Last 10 Encounters:  01/14/21 216 lb 1.6 oz (98 kg)  11/12/20 212 lb 9.6 oz (96.4 kg)  05/30/20 225 lb 3.2 oz (102.2 kg)  05/01/20 223 lb (101.2 kg)  02/05/20 223 lb 1.6 oz (101.2 kg)  01/18/20 223 lb 9.6 oz (101.4 kg)  08/16/19 222 lb (100.7 kg)  08/02/19 223 lb 8 oz (101.4 kg)  05/29/19 227 lb 9.6 oz (103.2 kg)  04/11/19 233 lb 6.4 oz (105.9 kg)   RBW- 150-160# MEDICATIONS: victoza 1.8 mg/day(last dose 1 months ago), metformin 1000 mg twice a day BLOOD SUGAR: has not checked in 1 month. Brought meter: checked 13 times in past 60 days, last check 12/01/20. Average 226, range 132-437, possible pattern of being higher at  night CBG (last 3)  Recent Labs    01/14/21 0937  GLUCAP 261*   Lab Results  Component Value Date   HGBA1C 12.7 (A) 11/12/2020   HGBA1C 9.3 (A) 05/01/2020   HGBA1C 7.7 (A) 08/02/2019   HGBA1C 8.0 (A) 05/29/2019   HGBA1C 7.1 (A) 09/26/2018     Progress Towards Goal(s):  No progress.   Nutritional Diagnosis:  NI-1.5 Excessive energy intake As related to excess added sugar and starches  As evidenced by her increased blood sugar.   Intervention:  Nutrition education about how high blood sugar can cause tiredness, irritability, hunger, and increased urination. Gave examples of sugar free juices. Left a message for eye appointment. Assisted with goal setting and planning for a change in beverage choices Coordination of care:referral to  pharmacy to assist with med adherence and diabetes self care.   Teaching Method Utilized: Visual, Auditory,Hands on Handouts given during visit include:healthy fats, goals and evaluation of goals from last month.  Barriers to learning/adherence to lifestyle change:limited finances,  mental health issues, medicines and social situation Demonstrated degree of understanding via:  Teach Back   Monitoring/Evaluation:  Dietary intake, exercise, meter, and body weight in 3 week(s). Debera Lat, RD 01/14/2021 11:52 AM.

## 2021-01-16 ENCOUNTER — Encounter: Payer: Self-pay | Admitting: Internal Medicine

## 2021-01-19 ENCOUNTER — Other Ambulatory Visit: Payer: Self-pay | Admitting: Dietician

## 2021-01-19 ENCOUNTER — Telehealth: Payer: Self-pay | Admitting: Dietician

## 2021-01-19 DIAGNOSIS — E119 Type 2 diabetes mellitus without complications: Secondary | ICD-10-CM

## 2021-01-19 MED ORDER — ACCU-CHEK FASTCLIX LANCETS MISC: 3 refills | Status: DC

## 2021-01-19 MED ORDER — ACCU-CHEK GUIDE VI STRP: ORAL_STRIP | 3 refills | Status: DC

## 2021-01-19 NOTE — Telephone Encounter (Signed)
Patient requests 90 day refill on testing supplies.

## 2021-01-19 NOTE — Telephone Encounter (Signed)
Called and spoke to [place]: She said Mail order and 90 days Rxs would help.   I was not sure how to I tell her to transfer her meds.  She said she has to check with her payee and that will complicate matters.  She has to get a ride to [place] and still has not gotten her strips. Will request 90 days refills to limit her trips to pharmacies.   [name]

## 2021-01-19 NOTE — Addendum Note (Signed)
Addended by: Resa Miner on: 01/19/2021 02:01 PM   Modules accepted: Orders

## 2021-01-19 NOTE — Telephone Encounter (Signed)
[name] is having difficulties with transportation to her pharmacies. [place] does not deliver so she has to schedule medical transportation to go there and they do not bill Medicare for her testing supplies so she has to use two pharmacies. She recently picked up Victoza -a 20 day supply because it was written for 25ml. The pharmacist recommended 3 month supplies which would be 27 ml . She told me at her lat visit that she would like to have her pharmacy needs delivered if possible. I will ask pharmacy to assist in coordinating this.

## 2021-01-19 NOTE — Telephone Encounter (Signed)
I just let [name] know that they could do everything except bill Part B. She said she is about to reach out to the patient to see what she can do about those.

## 2021-01-19 NOTE — Telephone Encounter (Signed)
Thank you both.  I told Rosendo Gros I will call her as [facility] does not bill Medicare part B.

## 2021-01-20 NOTE — Telephone Encounter (Signed)
Yes it shouldnt be an issue... we/she will just have to call the pharmacy she wants the meds at & tell them where to transfer them from, or the provider can send the new RX's to the pharmacy she wants to use.

## 2021-01-23 ENCOUNTER — Telehealth: Payer: Self-pay | Admitting: *Deleted

## 2021-01-23 NOTE — Chronic Care Management (AMB) (Signed)
  Care Management   Note  01/23/2021 Name: [name] MRN: [id number] DOB: 03/29/1971  [name] is a 50 y.o. year old female who is a primary care patient of Truman Hayward Dimple Nanas, MD. I reached out to Johnanna Schneiders by phone today in response to a referral sent by Ms. Lulu Riding PCP, Dr. Truman Hayward.     Ms. Cofield was given information about care management services today including:  1. Care management services include personalized support from designated clinical staff supervised by her physician, including individualized plan of care and coordination with other care providers 2. 24/7 contact phone numbers for assistance for urgent and routine care needs. 3. The patient may stop care management services at any time by phone call to the office staff.  Patient agreed to services and verbal consent obtained.   Follow up plan: Telephone appointment with care management team member scheduled for:02/16/2021  [place] [facility]

## 2021-02-03 ENCOUNTER — Telehealth: Payer: Self-pay | Admitting: Dietician

## 2021-02-03 NOTE — Telephone Encounter (Signed)
Has to go to [facility] to get her test strips and [facility] for her blood pressure medicine. She is waiting for her sister to take her. I informed her that she'd be meeting with our pharmacist next week and they can discuss her options to make getting her medicines and supplies easier for her. She rescheduled her visit with me for Friday if she can get transportation.

## 2021-02-04 ENCOUNTER — Ambulatory Visit: Payer: Medicare Other | Admitting: Dietician

## 2021-02-04 NOTE — Telephone Encounter (Signed)
That would be great.  Thank you

## 2021-02-04 NOTE — Telephone Encounter (Signed)
[name] confirmed her appointment for Friday with diabetes Educator. She has not been able to check her blood sugars since January as she could not get her strips due to not having transportation to [employer]. Could consider professional  CGM sensor placement on Friday if blood sugars are elevated.

## 2021-02-06 ENCOUNTER — Ambulatory Visit (INDEPENDENT_AMBULATORY_CARE_PROVIDER_SITE_OTHER): Payer: Medicare Other | Admitting: Dietician

## 2021-02-06 ENCOUNTER — Encounter: Payer: Self-pay | Admitting: Dietician

## 2021-02-06 DIAGNOSIS — Z6837 Body mass index (BMI) 37.0-37.9, adult: Secondary | ICD-10-CM

## 2021-02-06 DIAGNOSIS — E119 Type 2 diabetes mellitus without complications: Secondary | ICD-10-CM

## 2021-02-06 DIAGNOSIS — Z713 Dietary counseling and surveillance: Secondary | ICD-10-CM | POA: Diagnosis not present

## 2021-02-06 LAB — GLUCOSE, CAPILLARY: Glucose-Capillary: 177 mg/dL — ABNORMAL HIGH (ref 70–99)

## 2021-02-06 NOTE — Progress Notes (Signed)
Medical Nutrition Therapy:  Appt start time: 01115 end time:  1155 Total time spent: 40 minutes Visit # 3, last visit 03/2018  Assessment:  Primary concerns today: glucose control and Dexcom  Ms. Amato states she last took her victoza a month ago and last checked her blood sugar in January. She has been out of test strips, Metformin and blood pressure medicines, but she is planning to go on Saturday to pick those up. She states she does the food shopping.  She uses transportation services to get to the pharmacy.   Today was mostly focused on installing the Dexcom app on her phone and placing a sample sensor on her arm to be read at her doctors appointment next week. Time/transportation constraints did not allow for full instruction on how to use the Dexcom app with her phone.  Today her blood sugar was 177mg /dL. She stated that she started buying the juices that were on the handout she was given last visit. Plan to do more in depth nutrition instruction at the next visit.  Her goal to do that is to drink more water, eat small meals and walk for 15-30 minutes a day.   ANTHROPOMETRICS: Estimated body mass index is 37.59 kg/m as calculated from the following:   Height as of 05/30/20: 5\' 4"  (1.626 m).   Weight as of this encounter: 219 lb (99.3 kg).  Wt Readings from Last 10 Encounters:  02/06/21 219 lb (99.3 kg)  01/14/21 216 lb 1.6 oz (98 kg)  11/12/20 212 lb 9.6 oz (96.4 kg)  05/30/20 225 lb 3.2 oz (102.2 kg)  05/01/20 223 lb (101.2 kg)  02/05/20 223 lb 1.6 oz (101.2 kg)  01/18/20 223 lb 9.6 oz (101.4 kg)  08/16/19 222 lb (100.7 kg)  08/02/19 223 lb 8 oz (101.4 kg)  05/29/19 227 lb 9.6 oz (103.2 kg)   RBW- 150-160# MEDICATIONS: victoza 1.8 mg/day(last dose 1 months ago); out of Metformin  CBG (last 3)  Recent Labs    02/06/21 1141  GLUCAP 177*   Lab Results  Component Value Date   HGBA1C 12.7 (A) 11/12/2020   HGBA1C 9.3 (A) 05/01/2020   HGBA1C 7.7 (A) 08/02/2019   HGBA1C  8.0 (A) 05/29/2019   HGBA1C 7.1 (A) 09/26/2018     Progress Towards Goal(s):  Some progress.   Nutritional Diagnosis:  NI-1.5 Excessive energy intake As related to excess added sugar and starches is improving As evidenced by her decreased blood sugar and report of switching to low sugar juices  Intervention: Nutrition education about CGM & placed sample Dexcom G6 and assisted with setting up phone app Coordination of care:referral to  pharmacy to assist with entering sharing code and lab to upload Dexcom at her next appointment with her doctor   Teaching Method Utilized: Visual, Auditory,Hands on Handouts given during visit include: AVS Barriers to learning/adherence to lifestyle change:limited finances,  mental health issues, medicines and social situation Demonstrated degree of understanding via:  Teach Back   Monitoring/Evaluation:  Dietary intake, exercise, meter, Dexcom and body weight in 4 week(s). Debera Lat, RD 02/06/2021 2:56 PM.  Addendum Called patient to confirm Dexcom had started and that she can see readings on her phone. She reports blood sugar is 155mg /dL. Confirmed she is taking her Victoza

## 2021-02-10 ENCOUNTER — Encounter: Payer: Medicare Other | Admitting: Student

## 2021-02-10 ENCOUNTER — Encounter: Payer: Medicare Other | Admitting: Pharmacist

## 2021-02-15 ENCOUNTER — Other Ambulatory Visit (HOSPITAL_COMMUNITY): Payer: Self-pay

## 2021-02-15 MED FILL — Metformin HCl Tab 1000 MG: ORAL | 90 days supply | Qty: 180 | Fill #0 | Status: CN

## 2021-02-15 MED FILL — Liraglutide Soln Pen-injector 18 MG/3ML (6 MG/ML): SUBCUTANEOUS | 20 days supply | Qty: 6 | Fill #0 | Status: CN

## 2021-02-16 ENCOUNTER — Ambulatory Visit: Payer: Medicare Other | Admitting: *Deleted

## 2021-02-16 DIAGNOSIS — E119 Type 2 diabetes mellitus without complications: Secondary | ICD-10-CM

## 2021-02-16 DIAGNOSIS — F259 Schizoaffective disorder, unspecified: Secondary | ICD-10-CM

## 2021-02-16 DIAGNOSIS — I10 Essential (primary) hypertension: Secondary | ICD-10-CM

## 2021-02-16 NOTE — Patient Instructions (Signed)
Visit Information It was nice speaking with you today. PATIENT GOALS:  Goals Addressed            This Visit's Progress   . Obtain Eye Exam-Diabetes Type 2       Follow Up Date 03/17/21   - keep appointment with eye doctor - schedule appointment with eye doctor    Why is this important?    Eye check-ups are important when you have diabetes.   Vision loss can be prevented.    Notes: 02/16/21- Patient states she will make appointment       [name] was given information about Care Management services today including:  1. Care Management services include personalized support from designated clinical staff supervised by her physician, including individualized plan of care and coordination with other care providers 2. 24/7 contact phone numbers for assistance for urgent and routine care needs. 3. The patient may stop CCM services at any time (effective at the end of the month) by phone call to the office staff.  Patient agreed to services and verbal consent obtained.   The patient verbalized understanding of instructions, educational materials, and care plan provided today and declined offer to receive copy of patient instructions, educational materials, and care plan.   The care management team will reach out to the patient again over the next 30-60 days.   Kelli Churn RN, CCM, [facility] RN Care Manager [phone]

## 2021-02-16 NOTE — Chronic Care Management (AMB) (Signed)
Care Management    RN Visit Note  02/16/2021 Name: [name] MRN: [id number] DOB: Jun 26, 1971  Subjective: [name] is a 50 y.o. year old female who is a primary care patient of Truman Hayward Dimple Nanas, MD. The care management team was consulted for assistance with disease management and care coordination needs.    Engaged with patient by telephone for initial visit in response to provider referral for case management and/or care coordination services.   Consent to Services:   [name] was given information about Care Management services today including:  1. Care Management services includes personalized support from designated clinical staff supervised by her physician, including individualized plan of care and coordination with other care providers 2. 24/7 contact phone numbers for assistance for urgent and routine care needs. 3. The patient may stop case management services at any time by phone call to the office staff.  Patient agreed to services and consent obtained.   Assessment: Review of patient past medical history, allergies, medications, health status, including review of consultants reports, laboratory and other test data, was performed as part of comprehensive evaluation and provision of chronic care management services.   SDOH (Social Determinants of Health) assessments and interventions performed:    Care Plan  Allergies  Allergen Reactions  . Bactrim [Sulfamethoxazole-Trimethoprim] Anaphylaxis, Shortness Of Breath and Swelling  . Lisinopril Cough  . Losartan Cough  . Bee Pollen Other (See Comments)    Seasonal allergies  . Ivp Dye [Iodinated Diagnostic Agents] Nausea And Vomiting  . Metrizamide Nausea And Vomiting  . Pollen Extract     Seasonal allergies  . Sulfa Antibiotics Swelling    Eyes and lips swelled up  . Sulfasalazine Swelling    Eyes and lips swelled up  . Other Rash  . Peanut-Containing Drug Products Rash    Outpatient Encounter Medications as  of 02/16/2021  Medication Sig Note  . amLODipine (NORVASC) 5 MG tablet TAKE 1 TABLET (5 MG TOTAL) BY MOUTH DAILY.   . benztropine (COGENTIN) 0.5 MG tablet Take 0.5 mg by mouth 2 (two) times daily. MORNING (0900) AND NIGHT (2100)   . cetirizine (ZYRTEC ALLERGY) 10 MG tablet Take 1 tablet (10 mg total) by mouth daily. 02/16/2021: Takes prn  . DULoxetine (CYMBALTA) 30 MG capsule Take 30 mg by mouth daily.   [name] liraglutide (VICTOZA) 18 MG/3ML SOPN INJECT 1.8 MG INTO THE SKIN ONCE AS DIRECTED   . metFORMIN (GLUCOPHAGE) 1000 MG tablet TAKE 1 TABLET BY MOUTH 2 TIMES DAILY   . paliperidone (INVEGA SUSTENNA) 156 MG/ML SUSP injection Inject 156 mg into the muscle every 30 (thirty) days.    . Accu-Chek FastClix Lancets MISC Check blood sugar two times a day   . atorvastatin (LIPITOR) 20 MG tablet Take 1 tablet (20 mg total) by mouth daily. (Patient not taking: Reported on 02/16/2021)   . carvedilol (COREG) 6.25 MG tablet TAKE ONE (1) TABLET BY MOUTH TWO (2) TIMES DAILY WITH A MEAL (Patient not taking: Reported on 02/16/2021)   . fluticasone (FLONASE) 50 MCG/ACT nasal spray Place 1 spray into both nostrils daily. 02/16/2021: Uses prn  . glucose blood (ACCU-CHEK GUIDE) test strip Check blood sugar two times a day as instructed   . guaiFENesin (MUCINEX) 600 MG 12 hr tablet Take 1 tablet (600 mg total) by mouth 2 (two) times daily.   . Insulin Pen Needle 32G X 4 MM MISC USE TO INJECT INSULIN ONCE DAILY. DIAGNOSIS CODE E11.21    No facility-administered encounter medications  on file as of 02/16/2021.    Patient Active Problem List   Diagnosis Date Noted  . Left shoulder pain 02/05/2020  . Vaginal discharge 08/02/2019  . Need for immunization against influenza 09/30/2018  . Hypertension 03/21/2018  . Allergic rhinitis 01/25/2018  . OSA (obstructive sleep apnea) 06/10/2017  . Congenital hearing disorder 12/08/2016  . Schizoaffective disorder, chronic condition ([place]) 06/18/2016  . Hyperlipidemia LDL goal <70  09/10/2014  . Obesity (BMI 30-39.9) 07/30/2014  . Pap smear for cervical cancer screening 07/30/2014  . Type 2 diabetes mellitus ([place]) 11/07/2012    Conditions to be addressed/monitored: NIDDM, HTN, HLD, Schizoaffective disorder, obesity, OSA- no CPAP  Care Plan : CCM RN- Diabetes Type 2 (Adult)  Updates made by Barrington Ellison, RN since 02/16/2021 12:00 AM    Problem: Disease Progression (Diabetes, Type 2)     Long-Range Goal: Disease Progression Prevented or Minimized   Start Date: 02/16/2021  This Visit's Progress: On track  Priority: High  Note:   Objective:  Lab Results  Component Value Date   HGBA1C 12.7 (A) 11/12/2020 .   Lab Results  Component Value Date   CREATININE 0.59 10/06/2020   CREATININE 0.65 05/01/2020   CREATININE 0.62 05/29/2019 .   [name] No results found for: EGFR Current Barriers:  Knowledge Deficits related to basic Diabetes pathophysiology and self care/management-successful outreach to patient to complete initial CCM assessment, she states the issue she struggles with the most is knowing what to eat to managed her blood sugars, she says she is wearing the professional CGM and has not been checking her blood sugars because she has not been able to get to the drug store to pick up the supplies as she must arrange transportation with [place], she says she receives help from the [facility] staff to manage her finances and to assist with managing her mental health issues . Does not have glucometer to monitor blood sugar . Unable to perform IADLs independently Case Manager Clinical Goal(s):  . patient will demonstrate improved adherence to prescribed treatment plan for diabetes self care/management as evidenced by: adherence to ADA/ carb modified diet exercise 3-5 days/week adherence to prescribed medication regimen contacting provider for new or worsened symptoms or questions Interventions:  . Collaboration with Mosetta Anis, MD regarding development and update  of comprehensive plan of care as evidenced by provider attestation and co-signature . Inter-disciplinary care team collaboration (see longitudinal plan of care) . Provided education to patient about basic DM disease process . Reviewed medications with patient and discussed importance of medication adherence . Discussed plans with patient for ongoing care management follow up and provided patient with direct contact information for care management team . Provided patient with written educational materials related to hypo and hyperglycemia and importance of correct treatment . Reviewed scheduled/upcoming provider appointments including: 02/19/21 with clinic provider and clinic pharmacist . Review of patient status, including review of consultants reports, relevant laboratory and other test results, and medications completed. Self-Care Activities - UNABLE to independently manage diabetes to meet treatment goals Patient Goals:  - change to whole grain breads, cereal, pasta - drink 6 to 8 glasses of water each day - limit fast food meals to no more than 1 per week - switch to sugar-free drinks - keep appointment with eye doctor - schedule appointment with eye doctor - check feet daily for cuts, sores or redness - keep feet up while sitting - wash and dry feet carefully every day - wear comfortable, well-fitting  shoes Follow Up Plan: The care management team will reach out to the patient again over the next 30-60 days.     Task: Monitor and Manage Follow-up for Comorbidities   Priority: Routine  Note:   Care Management Activities:    - completion of annual dilated eye exam confirmed - completion of annual foot exam verified - modest weight loss (5 percent) promoted - quality of sleep assessed - reduction of sedentary activity encouraged - response to pharmacologic therapy monitored  -remind patient to keep appointment with clinic pharmacist on 02/19/21   Notes:      Plan: The care  management team will reach out to the patient again over the next 30-60 days.  Kelli Churn RN, CCM, [facility] RN Care Manager [phone]

## 2021-02-17 ENCOUNTER — Telehealth: Payer: Self-pay | Admitting: Dietician

## 2021-02-17 NOTE — Telephone Encounter (Signed)
[name] called and left a message" The thing on her arm is not reading anymore and is asking for a pass code. Please call." Called her back and informed her that it only works for 10 days and yesterday was her 10th day. She state the readings were 97-119. She was asked to be sure to bring her phone and the sensor to her appointments on Thursday. I explained that currently her insurance will not cover more sensors and that she can resume checking her blood sugar with her meter as soon as she can get test strips.

## 2021-02-19 ENCOUNTER — Other Ambulatory Visit: Payer: Self-pay

## 2021-02-19 ENCOUNTER — Ambulatory Visit: Payer: Medicare Other | Admitting: Pharmacist

## 2021-02-19 ENCOUNTER — Ambulatory Visit (INDEPENDENT_AMBULATORY_CARE_PROVIDER_SITE_OTHER): Payer: Medicare Other | Admitting: Internal Medicine

## 2021-02-19 ENCOUNTER — Encounter: Payer: Self-pay | Admitting: Internal Medicine

## 2021-02-19 ENCOUNTER — Telehealth: Payer: Self-pay

## 2021-02-19 ENCOUNTER — Other Ambulatory Visit (HOSPITAL_COMMUNITY): Payer: Self-pay

## 2021-02-19 VITALS — BP 132/90 | HR 102 | Temp 98.6°F | Ht 64.0 in | Wt 215.0 lb

## 2021-02-19 DIAGNOSIS — E785 Hyperlipidemia, unspecified: Secondary | ICD-10-CM | POA: Diagnosis not present

## 2021-02-19 DIAGNOSIS — E119 Type 2 diabetes mellitus without complications: Secondary | ICD-10-CM

## 2021-02-19 DIAGNOSIS — I10 Essential (primary) hypertension: Secondary | ICD-10-CM

## 2021-02-19 DIAGNOSIS — Z794 Long term (current) use of insulin: Secondary | ICD-10-CM

## 2021-02-19 DIAGNOSIS — Z23 Encounter for immunization: Secondary | ICD-10-CM | POA: Insufficient documentation

## 2021-02-19 LAB — GLUCOSE, CAPILLARY: Glucose-Capillary: 166 mg/dL — ABNORMAL HIGH (ref 70–99)

## 2021-02-19 LAB — POCT GLYCOSYLATED HEMOGLOBIN (HGB A1C): Hemoglobin A1C: 8.4 % — AB (ref 4.0–5.6)

## 2021-02-19 MED ORDER — ATORVASTATIN CALCIUM 20 MG PO TABS
20.0000 mg | ORAL_TABLET | Freq: Every day | ORAL | 3 refills | Status: DC
  Filled 2021-02-19 (×3): qty 90, 90d supply, fill #0

## 2021-02-19 MED ORDER — CARVEDILOL 6.25 MG PO TABS
ORAL_TABLET | ORAL | 3 refills | Status: DC
  Filled 2021-02-19 (×3): qty 120, 60d supply, fill #0

## 2021-02-19 MED FILL — Metformin HCl Tab 1000 MG: ORAL | 90 days supply | Qty: 180 | Fill #0 | Status: CN

## 2021-02-19 MED FILL — Amlodipine Besylate Tab 5 MG (Base Equivalent): ORAL | 90 days supply | Qty: 90 | Fill #0 | Status: AC

## 2021-02-19 MED FILL — Liraglutide Soln Pen-injector 18 MG/3ML (6 MG/ML): SUBCUTANEOUS | 20 days supply | Qty: 6 | Fill #0 | Status: CN

## 2021-02-19 MED FILL — Liraglutide Soln Pen-injector 18 MG/3ML (6 MG/ML): SUBCUTANEOUS | 20 days supply | Qty: 6 | Fill #0 | Status: AC

## 2021-02-19 MED FILL — Amlodipine Besylate Tab 5 MG (Base Equivalent): ORAL | 90 days supply | Qty: 90 | Fill #0 | Status: CN

## 2021-02-19 MED FILL — Metformin HCl Tab 1000 MG: ORAL | 90 days supply | Qty: 180 | Fill #0 | Status: AC

## 2021-02-19 NOTE — Telephone Encounter (Signed)
   Telephone encounter was:  Unsuccessful.  02/19/2021 Name: [name] MRN: [id number] DOB: [birth year]  Unsuccessful outbound call made today to assist with:  Transportation Needs   Outreach Attempt:  1st Attempt   Unable to leave message voicemail does not pick up  [place], AAS Paralegal, [place] . Embedded Care Coordination [facility]  Care Management  [address], [place] [address] ??[email or web address]  ?? [phone]   [email or web address]

## 2021-02-19 NOTE — Progress Notes (Signed)
CC: Diabetes  HPI: [name] is a 50 y.o. with PMH listed below presenting with complaint of diabetes. Please see problem based assessment and plan for further details.  Past Medical History:  Diagnosis Date  . Anemia   . Congenital cerebral palsy ([place])   . Depression   . Diabetes mellitus due to abnormal insulin ([place])   . Edema 06/10/2017  . Endometriosis   . Hearing impairment   . Heart murmur    at birth  . Hypertension   . Neuromuscular disorder ([place])    Cerebral palsy- very mild left side of brain  . Schizoaffective disorder ([place])   . Thyroid nodule 07/30/2014   CT scan from 08/27/2010: A 1.9 cm calcified nodule involving the lower pole of the right lobe of the thyroid gland. US Neck 11/06/14: Bilateral nodules. Dominant right lower pole nodule measures 2.3 cm. Findings meet consensus criteria for biopsy. Ultrasound-guided fine needle aspiration should be considered Aspiration pathology: benign follicular nodule   Review of Systems: Review of Systems  Constitutional: Negative for chills, fever and malaise/fatigue.  Eyes: Negative for blurred vision.  Respiratory: Negative for shortness of breath.   Cardiovascular: Negative for chest pain, palpitations and leg swelling.  Gastrointestinal: Negative for constipation, diarrhea, nausea and vomiting.  Genitourinary: Negative for dysuria.  All other systems reviewed and are negative.    Physical Exam: Vitals:   02/19/21 0847 02/19/21 0909  BP: (!) 143/100 132/90  Pulse: (!) 102   Temp: 98.6 F (37 C)   TempSrc: Oral   SpO2: 100%   Weight: 215 lb (97.5 kg)   Height: 5\' 4"  (1.626 m)    Gen: Well-developed, well nourished, NAD HEENT: NCAT head, hearing intact CV: RRR, S1, S2 normal Pulm: CTAB, No rales, no wheezes Extm: ROM intact, Peripheral pulses intact, No peripheral edema Skin: Dry, Warm, normal turgor, no wounds, no rashes, no lesions  Assessment & Plan:   Hypertension BP Readings from Last 3  Encounters:  02/19/21 132/90  11/12/20 125/87  10/07/20 (!) 136/99   Above goal but acceptable. Currently on amlodipine and carvedilol. Mentions that she has difficulty with transportation to [facility] to pick up her medications and while she has been taking amlodipine daily, She has not taken any carvedilol recently.  A/P Elevated bp but close to goal. Unable to tolerate Ace/Arb, if persistently elevated, can increase amlodipine dose or add on thiazide diuretic  - C/w amlodipine 5mg  daily, carvedilol 6.25mg  BID  Hyperlipidemia LDL goal <70 Lab Results  Component Value Date   CHOL 134 05/01/2020   HDL 38 (L) 05/01/2020   LDLCALC 81 05/01/2020   TRIG 74 05/01/2020   CHOLHDL 3.5 05/01/2020   Mentions that she has stopped taking atorvastatin after she ran out due to transportation difficulty to [employer]. Discussed option of switching to mail order pharmacy but she mentions being more diligent about picking up her medications on time.  - C/w atorvastatin 40mg  daily, refills sent  Need for pneumococcal vaccination Agreeable to receive pneumococcal vaccination this visit  Type 2 diabetes mellitus ([place]) Lab Results  Component Value Date   HGBA1C 8.4 (A) 02/19/2021   [name] is a 50 yo F w/ PMH of T2DM, HTN, Schizoaffective disorder presenting to [facility] for management of her chronic conditions. She has had significant improvement in her glycemic control after referral to [facility] for medical nutrition therapy. She mentions having significant decrease in her consumption of soda, bread, sweets although she has not completely cut it out yet. She  mentions taking her victoza and metformin as prescribed without significant side effects. She has an appointment with clinic pharmacy team for further medication education today.  A/P Improved glycemic control with dietary changes and improved adherence to prescribed meds. Congratulated [name] on her success and advised on goal hgb a1c of <7 with  recommendation to continue improving her diet. Also offered weekly dosing with semaglutide rather than liraglutide for easier med adherence but Ms.Chiao prefers the current regimen.  - F/u with MNT - C/w liraglutide 1.8mg  daily, metformin 1000mg  bid - Has upcoming appt with optho - F/u in 3 months   Patient discussed with Dr. Evette Doffing  -Gilberto Better, [facility] Pager: [phone]

## 2021-02-19 NOTE — Progress Notes (Signed)
   Subjective/Objective:    Patient ID: [name], female    DOB: [birth year], 50 y.o.   MRN: [id number]  HPI  Patient is a 50 y.o. female who presents for medication review and management.  She is in good spirits and presents without assistance. Patient was referred on 01/14/21 and last seen by Primary Care Provider on 11/12/20.  Medication Adherence Questionnaire (A score of 2 or more points indicates risk for nonadherence)  Do you know what each of your medicines is for? 0 (1 point if no)  Do you ever have trouble remembering to take your medicine? 0 (2 points if yes)  Do you ever not take a medicine because you feel you do not need it?  0 (1 point if yes)  Do you think that any of your medicines is not helping you? 0 (1 point if yes)  Do you have any physical problems such as vision loss that keep you from taking your medicines as prescribed?  2; transportation (2 points if yes)  Do you think any of your medicine is causing a side effect?  0 (1 point if yes)  Do you know the names of ALL of your medicines? 0 (1 point if no)  Do you think that you need ALL of your medicines? 0 (1 point if no)  In the past 6 months, have you missed getting a refill or a new prescription filled on time? 1; due to transportation  (1 point if yes)  How often do you miss taking a dose of medicine? 2; sometimes she oversleeps and forgets her morning medications Never (0 points), 1 or 2 times a month (1 points), 1 time a week (2 points), 2 or more times a week (3 points).   TOTAL SCORE 5/14    Assessment/Plan:   Understanding of regimen: good  Understanding of indications: good  Potential of compliance: good  Patient has known adherence challenges based on score of 5 for questionnaire. Barriers include: forgetfulness and physical barriers of transportation. Medication list reviewed and updated. Patient was provided with a printed medication list. I had patient's medications refilled today transferred to  [facility] as they will mail her the prescriptions. This will eliminate the barrier of transportation. Patient also agreeable to setting alarm on her phone in the morning to help her remember to take her morning medications.  Follow-up appointment with provider at next appt. Written patient instructions provided.  This appointment required 40 minutes of patient care (this includes precharting, chart review, review of results, and face-to-face care).  Thank you for involving pharmacy to assist in providing this patient's care.

## 2021-02-19 NOTE — Assessment & Plan Note (Signed)
Agreeable to receive pneumococcal vaccination this visit

## 2021-02-19 NOTE — Assessment & Plan Note (Signed)
Lab Results  Component Value Date   CHOL 134 05/01/2020   HDL 38 (L) 05/01/2020   LDLCALC 81 05/01/2020   TRIG 74 05/01/2020   CHOLHDL 3.5 05/01/2020   Mentions that she has stopped taking atorvastatin after she ran out due to transportation difficulty to [place]. Discussed option of switching to mail order pharmacy but she mentions being more diligent about picking up her medications on time.  - C/w atorvastatin 40mg  daily, refills sent

## 2021-02-19 NOTE — Patient Instructions (Signed)
Ms. Souffrant it was a pleasure seeing you today.   Today we reviewed all of the medications you are currently taking. Included is an updated medication list. Please continue taking all medications as prescribed on this list.  To help you remember to take your medications:  - Set up phone alarms to remember to take your morning medications  I will have your medications sent to a pharmacy that can deliver them to you.  If you have any questions please call the clinic and ask to speak with me.  Follow-up with Dr. Truman Hayward at next appt.

## 2021-02-19 NOTE — Assessment & Plan Note (Signed)
Lab Results  Component Value Date   HGBA1C 8.4 (A) 02/19/2021   [name] is a 50 yo F w/ PMH of T2DM, HTN, Schizoaffective disorder presenting to [facility] for management of her chronic conditions. She has had significant improvement in her glycemic control after referral to [facility] for medical nutrition therapy. She mentions having significant decrease in her consumption of soda, bread, sweets although she has not completely cut it out yet. She mentions taking her victoza and metformin as prescribed without significant side effects. She has an appointment with clinic pharmacy team for further medication education today.  A/P Improved glycemic control with dietary changes and improved adherence to prescribed meds. Congratulated Ms.Owens Shark on her success and advised on goal hgb a1c of <7 with recommendation to continue improving her diet. Also offered weekly dosing with semaglutide rather than liraglutide for easier med adherence but Ms.Kuipers prefers the current regimen.  - F/u with MNT - C/w liraglutide 1.8mg  daily, metformin 1000mg  bid - Has upcoming appt with optho - F/u in 3 months

## 2021-02-19 NOTE — Patient Instructions (Addendum)
Dear [name],  Thank you for allowing [place] to provide your care today. Today we discussed your diabetes    I have ordered hgb a1c labs for you. I will call if any are abnormal.    Today we made no changes to your medications:    Please follow-up in 3 months.    Please call the internal medicine center clinic if you have any questions or concerns, we may be able to help and keep you from a long and expensive emergency room wait. Our clinic and after hours phone number is [phone], the best time to call is Monday through Friday 9 am to 4 pm but there is always someone available 24/7 if you have an emergency. If you need medication refills please notify your pharmacy one week in advance and they will send [place] a request.    If you have not gotten the COVID vaccine, I recommend doing so:  You may get it at your local [facility] or [facility] OR To schedule an appointment for a COVID vaccine or be added to the vaccine wait list: Go to [email or web address]   OR Go to [email or web address]                  OR Call [phone]                                     OR Call [phone] and select Option 2  Thank you for choosing [facility]   Diabetes Mellitus and Nutrition, Adult When you have diabetes, or diabetes mellitus, it is very important to have healthy eating habits because your blood sugar (glucose) levels are greatly affected by what you eat and drink. Eating healthy foods in the right amounts, at about the same times every day, can help you:  Control your blood glucose.  Lower your risk of heart disease.  Improve your blood pressure.  Reach or maintain a healthy weight. What can affect my meal plan? Every person with diabetes is different, and each person has different needs for a meal plan. Your health care provider may recommend that you work with a dietitian to make a meal plan that is best for you. Your meal plan may vary depending on factors such as:  The  calories you need.  The medicines you take.  Your weight.  Your blood glucose, blood pressure, and cholesterol levels.  Your activity level.  Other health conditions you have, such as heart or kidney disease. How do carbohydrates affect me? Carbohydrates, also called carbs, affect your blood glucose level more than any other type of food. Eating carbs naturally raises the amount of glucose in your blood. Carb counting is a method for keeping track of how many carbs you eat. Counting carbs is important to keep your blood glucose at a healthy level, especially if you use insulin or take certain oral diabetes medicines. It is important to know how many carbs you can safely have in each meal. This is different for every person. Your dietitian can help you calculate how many carbs you should have at each meal and for each snack. How does alcohol affect me? Alcohol can cause a sudden decrease in blood glucose (hypoglycemia), especially if you use insulin or take certain oral diabetes medicines. Hypoglycemia can be a life-threatening condition. Symptoms of hypoglycemia, such as sleepiness, dizziness, and confusion, are similar to symptoms of having too much alcohol.  Do not drink alcohol if: ? Your health care provider tells you not to drink. ? You are pregnant, may be pregnant, or are planning to become pregnant.  If you drink alcohol: ? Do not drink on an empty stomach. ? Limit how much you use to:  0-1 drink a day for women.  0-2 drinks a day for men. ? Be aware of how much alcohol is in your drink. In the [place] one drink equals one 12 oz bottle of beer (355 mL), one 5 oz glass of wine (148 mL), or one 1 oz glass of hard liquor (44 mL). ? Keep yourself hydrated with water, diet soda, or unsweetened iced tea.  Keep in mind that regular soda, juice, and other mixers may contain a lot of sugar and must be counted as carbs. What are tips for following this plan? Reading food labels  Start  by checking the serving size on the "Nutrition Facts" label of packaged foods and drinks. The amount of calories, carbs, fats, and other nutrients listed on the label is based on one serving of the item. Many items contain more than one serving per package.  Check the total grams (g) of carbs in one serving. You can calculate the number of servings of carbs in one serving by dividing the total carbs by 15. For example, if a food has 30 g of total carbs per serving, it would be equal to 2 servings of carbs.  Check the number of grams (g) of saturated fats and trans fats in one serving. Choose foods that have a low amount or none of these fats.  Check the number of milligrams (mg) of salt (sodium) in one serving. Most people should limit total sodium intake to less than 2,300 mg per day.  Always check the nutrition information of foods labeled as "low-fat" or "nonfat." These foods may be higher in added sugar or refined carbs and should be avoided.  Talk to your dietitian to identify your daily goals for nutrients listed on the label. Shopping  Avoid buying canned, pre-made, or processed foods. These foods tend to be high in fat, sodium, and added sugar.  Shop around the outside edge of the grocery store. This is where you will most often find fresh fruits and vegetables, bulk grains, fresh meats, and fresh dairy. Cooking  Use low-heat cooking methods, such as baking, instead of high-heat cooking methods like deep frying.  Cook using healthy oils, such as olive, canola, or sunflower oil.  Avoid cooking with butter, cream, or high-fat meats. Meal planning  Eat meals and snacks regularly, preferably at the same times every day. Avoid going long periods of time without eating.  Eat foods that are high in fiber, such as fresh fruits, vegetables, beans, and whole grains. Talk with your dietitian about how many servings of carbs you can eat at each meal.  Eat 4-6 oz (112-168 g) of lean protein  each day, such as lean meat, chicken, fish, eggs, or tofu. One ounce (oz) of lean protein is equal to: ? 1 oz (28 g) of meat, chicken, or fish. ? 1 egg. ?  cup (62 g) of tofu.  Eat some foods each day that contain healthy fats, such as avocado, nuts, seeds, and fish.   What foods should I eat? Fruits Berries. Apples. Oranges. Peaches. Apricots. Plums. Grapes. Mango. Papaya. Pomegranate. Kiwi. Cherries. Vegetables Lettuce. Spinach. Leafy greens, including kale, chard, collard greens, and mustard greens. Beets. Cauliflower. Cabbage. Broccoli. Carrots. Green beans.  Tomatoes. Peppers. Onions. Cucumbers. Brussels sprouts. Grains Whole grains, such as whole-wheat or whole-grain bread, crackers, tortillas, cereal, and pasta. Unsweetened oatmeal. Quinoa. [place] or wild rice. Meats and other proteins Seafood. Poultry without skin. Lean cuts of poultry and beef. Tofu. Nuts. Seeds. Dairy Low-fat or fat-free dairy products such as milk, yogurt, and cheese. The items listed above may not be a complete list of foods and beverages you can eat. Contact a dietitian for more information. What foods should I avoid? Fruits Fruits canned with syrup. Vegetables Canned vegetables. Frozen vegetables with butter or cream sauce. Grains Refined white flour and flour products such as bread, pasta, snack foods, and cereals. Avoid all processed foods. Meats and other proteins Fatty cuts of meat. Poultry with skin. Breaded or fried meats. Processed meat. Avoid saturated fats. Dairy Full-fat yogurt, cheese, or milk. Beverages Sweetened drinks, such as soda or iced tea. The items listed above may not be a complete list of foods and beverages you should avoid. Contact a dietitian for more information. Questions to ask a health care provider  Do I need to meet with a diabetes educator?  Do I need to meet with a dietitian?  What number can I call if I have questions?  When are the best times to check my blood  glucose? Where to find more information:  American Diabetes Association: diabetes.org  Academy of Nutrition and Dietetics: www.eatright.[employer] of Diabetes and Digestive and Kidney Diseases: [employer]inesFuneral.dk  Association of Diabetes Care and Education Specialists: www.diabeteseducator.org Summary  It is important to have healthy eating habits because your blood sugar (glucose) levels are greatly affected by what you eat and drink.  A healthy meal plan will help you control your blood glucose and maintain a healthy lifestyle.  Your health care provider may recommend that you work with a dietitian to make a meal plan that is best for you.  Keep in mind that carbohydrates (carbs) and alcohol have immediate effects on your blood glucose levels. It is important to count carbs and to use alcohol carefully. This information is not intended to replace advice given to you by your health care provider. Make sure you discuss any questions you have with your health care provider. Document Revised: 10/09/2019 Document Reviewed: 10/09/2019 Elsevier Patient Education  2021 [employer].

## 2021-02-19 NOTE — Assessment & Plan Note (Addendum)
BP Readings from Last 3 Encounters:  02/19/21 132/90  11/12/20 125/87  10/07/20 (!) 136/99   Above goal but acceptable. Currently on amlodipine and carvedilol. Mentions that she has difficulty with transportation to [facility] to pick up her medications and while she has been taking amlodipine daily, She has not taken any carvedilol recently.  A/P Elevated bp but close to goal. Unable to tolerate Ace/Arb, if persistently elevated, can increase amlodipine dose or add on thiazide diuretic  - C/w amlodipine 5mg  daily, carvedilol 6.25mg  BID

## 2021-02-20 ENCOUNTER — Telehealth: Payer: Self-pay | Admitting: Dietician

## 2021-02-20 ENCOUNTER — Encounter: Payer: Self-pay | Admitting: Dietician

## 2021-02-20 NOTE — Telephone Encounter (Signed)
Sending letter to ask patient to call [facility] .Embedded Care Coordination  [phone]

## 2021-02-20 NOTE — Progress Notes (Signed)
Internal Medicine Clinic Attending  Case discussed with Dr. Lee  At the time of the visit.  We reviewed the resident's history and exam and pertinent patient test results.  I agree with the assessment, diagnosis, and plan of care documented in the resident's note.    

## 2021-02-23 ENCOUNTER — Telehealth: Payer: Self-pay

## 2021-02-23 NOTE — Telephone Encounter (Signed)
   Telephone encounter was:  Successful.  02/23/2021 Name: Anisha Starliper MRN: [id number] DOB: [birth year]  Tashayla Therien is a 50 y.o. year old female who is a primary care patient of Truman Hayward Dimple Nanas, MD . The community resource team was consulted for assistance with Transportation Needs   Care guide performed the following interventions: Patient provided with information about care guide support team and interviewed to confirm resource needs Spoke with patient gave her the number to call for Medicaid transportation and patient currently uses [facility] transportation for appointments. No further assistance is needed at this time.  Follow Up Plan:  No further follow up planned at this time. The patient has been provided with needed resources.  Parker Sawatzky, AAS Paralegal, [place] . Embedded Care Coordination [facility]  Care Management  [address], [place] [address] [email or web address]  ?? [phone]   [email or web address]

## 2021-03-02 ENCOUNTER — Ambulatory Visit (INDEPENDENT_AMBULATORY_CARE_PROVIDER_SITE_OTHER): Payer: Medicare Other | Admitting: Dietician

## 2021-03-02 ENCOUNTER — Ambulatory Visit: Payer: Medicare Other | Admitting: Dietician

## 2021-03-02 ENCOUNTER — Encounter: Payer: Self-pay | Admitting: Dietician

## 2021-03-02 DIAGNOSIS — Z6838 Body mass index (BMI) 38.0-38.9, adult: Secondary | ICD-10-CM

## 2021-03-02 DIAGNOSIS — Z713 Dietary counseling and surveillance: Secondary | ICD-10-CM

## 2021-03-02 DIAGNOSIS — E119 Type 2 diabetes mellitus without complications: Secondary | ICD-10-CM | POA: Diagnosis not present

## 2021-03-02 LAB — GLUCOSE, CAPILLARY: Glucose-Capillary: 184 mg/dL — ABNORMAL HIGH (ref 70–99)

## 2021-03-02 NOTE — Patient Instructions (Signed)
Please call Dr. Shirley Muscat to schedule an eye exam.  [facility] to [facility] to get your test strips  To help with weight and blood sugar try to eat smaller portions of: - thousand [place] Smaller amount of cheese on your salad Fruit for snacks and sides- like instead the chips at [employer]  Follow up in 1 month.  Butch Penny [phone]

## 2021-03-02 NOTE — Progress Notes (Signed)
Medical Nutrition Therapy:  Appt start time: 1115 end time:  1145 Total time spent: 30 minutes Visit # 4, last visit 01/2021  Assessment:  Primary concerns today: glucose control and Dexcom  Ms. Haros states she is not chekcing her blood sugar, but is taking her medicines. She is unhappy that her weight is increasing and states that her new heart medicines are making her sleep more- 12 hours a day. She states she walks daily for 30 minutes. She is drinking sugar free beverages and water. She is eating salads, today had [employer] chicken salad sandwich with chips and water.  Today her blood sugar was 184 mg/dL after eating. Her Dexcom sensor usage was inadequate to use.   Her goal to do that is to eat smaller portions and try to choose fruit and vegetables for snacks and sides and continue to walk for 30 minutes a day.   ANTHROPOMETRICS: Estimated body mass index is 38.11 kg/m as calculated from the following:   Height as of 02/19/21: 5\' 4"  (1.626 m).   Weight as of this encounter: 222 lb (100.7 kg).  Wt Readings from Last 10 Encounters:  03/02/21 222 lb (100.7 kg)  02/19/21 215 lb (97.5 kg)  02/06/21 219 lb (99.3 kg)  01/14/21 216 lb 1.6 oz (98 kg)  11/12/20 212 lb 9.6 oz (96.4 kg)  05/30/20 225 lb 3.2 oz (102.2 kg)  05/01/20 223 lb (101.2 kg)  02/05/20 223 lb 1.6 oz (101.2 kg)  01/18/20 223 lb 9.6 oz (101.4 kg)  08/16/19 222 lb (100.7 kg)   RBW- 150-160# MEDICATIONS: victoza 1.8 mg/day daily in am      Metformin 1000 mg twice a day .                              Lab Results  Component Value Date   HGBA1C 8.4 (A) 02/19/2021   HGBA1C 12.7 (A) 11/12/2020   HGBA1C 9.3 (A) 05/01/2020   HGBA1C 7.7 (A) 08/02/2019   HGBA1C 8.0 (A) 05/29/2019     Progress Towards Goal(s):  Some progress.   Nutritional Diagnosis:  NI-1.5 Excessive energy intake As related to excess added sugar and starches is improving As evidenced by her decreased blood sugar and report of switching to low sugar  juices  Intervention: Nutrition education about ways to decrease her weight and diabetes self management support. Coordination of care: contacted pharmacy to inform patient about how to obtain pen needles to take her victoza. Suggest switching to semaglutide for additional blood glucose and weight lowering will discuss with patient at next visit.  Teaching Method Utilized: Visual, Auditory,Hands on Handouts given during visit include: AVS Barriers to learning/adherence to lifestyle change:limited finances,  mental health issues, medicines and social situation Demonstrated degree of understanding via:  Teach Back   Monitoring/Evaluation:  Dietary intake, exercise, meter, and body weight in 4 week(s). Debera Lat, RD 03/02/2021 11:38 AM.

## 2021-03-12 ENCOUNTER — Other Ambulatory Visit: Payer: Self-pay | Admitting: Internal Medicine

## 2021-03-12 ENCOUNTER — Other Ambulatory Visit: Payer: Self-pay

## 2021-03-12 DIAGNOSIS — E119 Type 2 diabetes mellitus without complications: Secondary | ICD-10-CM

## 2021-03-12 MED ORDER — VICTOZA 18 MG/3ML ~~LOC~~ SOPN
PEN_INJECTOR | SUBCUTANEOUS | 3 refills | Status: DC
  Filled 2021-03-12: qty 9, 30d supply, fill #0

## 2021-03-13 ENCOUNTER — Other Ambulatory Visit: Payer: Self-pay

## 2021-03-16 ENCOUNTER — Other Ambulatory Visit: Payer: Self-pay

## 2021-03-17 ENCOUNTER — Other Ambulatory Visit (HOSPITAL_COMMUNITY): Payer: Self-pay

## 2021-03-17 ENCOUNTER — Ambulatory Visit: Payer: Medicare Other | Admitting: *Deleted

## 2021-03-17 ENCOUNTER — Other Ambulatory Visit: Payer: Self-pay

## 2021-03-17 DIAGNOSIS — E119 Type 2 diabetes mellitus without complications: Secondary | ICD-10-CM

## 2021-03-17 DIAGNOSIS — F259 Schizoaffective disorder, unspecified: Secondary | ICD-10-CM

## 2021-03-17 DIAGNOSIS — E785 Hyperlipidemia, unspecified: Secondary | ICD-10-CM

## 2021-03-17 DIAGNOSIS — I10 Essential (primary) hypertension: Secondary | ICD-10-CM

## 2021-03-17 NOTE — Chronic Care Management (AMB) (Signed)
Care Management    RN Visit Note  03/17/2021 Name: [name] MRN: [id number] DOB: [birth year]  Subjective: [name] is a 50 y.o. year old female who is a primary care patient of Truman Hayward Dimple Nanas, MD. The care management team was consulted for assistance with disease management and care coordination needs.    Engaged with patient by telephone for follow up visit in response to provider referral for case management and/or care coordination services.   Consent to Services:   [name] was given information about Care Management services today including:  1. Care Management services includes personalized support from designated clinical staff supervised by her physician, including individualized plan of care and coordination with other care providers 2. 24/7 contact phone numbers for assistance for urgent and routine care needs. 3. The patient may stop case management services at any time by phone call to the office staff.  Patient agreed to services and consent obtained.   Assessment: Review of patient past medical history, allergies, medications, health status, including review of consultants reports, laboratory and other test data, was performed as part of comprehensive evaluation and provision of chronic care management services.   SDOH (Social Determinants of Health) assessments and interventions performed:    Care Plan  Allergies  Allergen Reactions  . Bactrim [Sulfamethoxazole-Trimethoprim] Anaphylaxis, Shortness Of Breath and Swelling  . Lisinopril Cough  . Losartan Cough  . Bee Pollen Other (See Comments)    Seasonal allergies  . Ivp Dye [Iodinated Diagnostic Agents] Nausea And Vomiting  . Metrizamide Nausea And Vomiting  . Pollen Extract     Seasonal allergies  . Sulfa Antibiotics Swelling    Eyes and lips swelled up  . Sulfasalazine Swelling    Eyes and lips swelled up  . Other Rash  . Peanut-Containing Drug Products Rash    Outpatient Encounter Medications  as of 03/17/2021  Medication Sig Note  . Accu-Chek FastClix Lancets MISC Check blood sugar two times a day   . amLODipine (NORVASC) 5 MG tablet TAKE 1 TABLET (5 MG TOTAL) BY MOUTH DAILY.   [name] atorvastatin (LIPITOR) 20 MG tablet Take 1 tablet (20 mg total) by mouth daily. (Patient not taking: No sig reported)   . benztropine (COGENTIN) 0.5 MG tablet Take 0.5 mg by mouth 2 (two) times daily. MORNING (0900) AND NIGHT (2100)   . carvedilol (COREG) 6.25 MG tablet TAKE ONE (1) TABLET BY MOUTH TWO (2) TIMES DAILY WITH A MEAL   . cetirizine (ZYRTEC ALLERGY) 10 MG tablet Take 1 tablet (10 mg total) by mouth daily. 02/16/2021: Takes prn  . DULoxetine (CYMBALTA) 30 MG capsule Take 30 mg by mouth daily.   [name] glucose blood (ACCU-CHEK GUIDE) test strip Check blood sugar two times a day as instructed   . Insulin Pen Needle 32G X 4 MM MISC USE TO INJECT INSULIN ONCE DAILY. DIAGNOSIS CODE E11.21   . liraglutide (VICTOZA) 18 MG/3ML SOPN INJECT 1.8 MG INTO THE SKIN ONCE AS DIRECTED   . metFORMIN (GLUCOPHAGE) 1000 MG tablet TAKE 1 TABLET BY MOUTH 2 TIMES DAILY   . paliperidone (INVEGA SUSTENNA) 156 MG/ML SUSP injection Inject 156 mg into the muscle every 30 (thirty) days.     No facility-administered encounter medications on file as of 03/17/2021.    Patient Active Problem List   Diagnosis Date Noted  . Need for pneumococcal vaccination 02/19/2021  . Hypertension 03/21/2018  . Allergic rhinitis 01/25/2018  . OSA (obstructive sleep apnea) 06/10/2017  . Congenital hearing disorder  12/08/2016  . Schizoaffective disorder, chronic condition ([place]) 06/18/2016  . Hyperlipidemia LDL goal <70 09/10/2014  . Obesity (BMI 30-39.9) 07/30/2014  . Pap smear for cervical cancer screening 07/30/2014  . Type 2 diabetes mellitus ([name]) 11/07/2012    Conditions to be addressed/monitored: NIDDM, HTN, HLD, Schizoaffective disorder, obesity, OSA  Care Plan : CCM RN- Diabetes Type 2 (Adult)  Updates made by Barrington Ellison, RN since  03/17/2021 12:00 AM    Problem: Disease Progression (Diabetes, Type 2)     Long-Range Goal: Disease Progression Prevented or Minimized   Start Date: 02/16/2021  This Visit's Progress: Not on track  Recent Progress: On track  Priority: High  Note:   Objective:  Lab Results  Component Value Date   HGBA1C 12.7 (A) 11/12/2020 .   Lab Results  Component Value Date   CREATININE 0.59 10/06/2020   CREATININE 0.65 05/01/2020   CREATININE 0.62 05/29/2019 .   [name] No results found for: EGFR Current Barriers:  [name] Knowledge Deficits related to basic Diabetes pathophysiology and self care/management- successful outreach to patient to complete follow up CCM assessment, she states she is not checking her blood sugars because she has not been able to get to the drug store to pick up the supplies at [facility] as she must arrange transportation with [place], she says she is just about out of her "insulin" and needs refills, says she doesn't think [employer] delivers, she says she receives help from the [facility] staff to manage her finances and to assist with managing her mental health issues. Confirms she walks for 30 minutes daily . Does not have glucometer to monitor blood sugar . Unable to perform IADLs independently Case Manager Clinical Goal(s):  . patient will demonstrate improved adherence to prescribed treatment plan for diabetes self care/management as evidenced by: adherence to ADA/ carb modified diet exercise 3-5 days/week adherence to prescribed medication regimen contacting provider for new or worsened symptoms or questions Interventions:  . Collaboration with Mosetta Anis, MD regarding development and update of comprehensive plan of care as evidenced by provider attestation and co-signature . Inter-disciplinary care team collaboration (see longitudinal plan of care)- messaged clinic pharmacist Hughes Better that it was difficult to complete medication reconciliation over the phone  because patient is not clear on purpose and names of her medications . Confirmed patient received health educational material and [place] [facility] calendar in the mail . Reviewed medications with patient and discussed importance of medication adherence, reinforced with patient that she is not on insulin, that Victoza is an injection that helps with blood sugar  . Encouraged her to call [facility] and have her Victoza delivered to her home, ensured she has pharmacy phone number . Provided her with the phone number for Medicaid transportation and encouraged her to call and schedule a telephone assessment so she can utilize their services for medical transportation . Discussed plans with patient for ongoing care management follow up and provided patient with direct contact information for care management team . Provided patient with written educational materials related to hypo and hyperglycemia and importance of correct treatment via mailing her [place] [facility] spiral bound day planner with DM education . Reviewed notes of her visit with clinic pharmacist on 4/7 and reminded her medications will be delivered to her home by [facility] but she will need to call them  . Reviewed noted of her visit with Debera Lat clinic RD, [place] on 4/18 and reviewed  importance of continuing her daily walks, eliminating calories via drinks, increasing her consumption of fruits and vegetables and getting diabetes testing supplies . Reviewed scheduled/upcoming provider appointments including: 04/01/21 at 1:15 pm with Debera Lat clinic RD, [place] and encouraged her to write down her appointments in the [place] [facility] calendar . Reinforced need to schedule annual eye exam . Review of patient status, including review of consultants reports, relevant laboratory and other test results, and medications completed. Self-Care Activities - UNABLE to  independently manage diabetes to meet treatment goals Patient Goals:  - change to whole grain breads, cereal, pasta - drink 6 to 8 glasses of water each day - limit fast food meals to no more than 1 per week - switch to sugar-free drinks - keep appointment with eye doctor - schedule appointment with eye doctor - check feet daily for cuts, sores or redness - keep feet up while sitting - wash and dry feet carefully every day - wear comfortable, well-fitting shoes Follow Up Plan: The care management team will reach out to the patient again over the next 30-60 days.       Plan: The care management team will reach out to the patient again over the next 30-60 days.  Kelli Churn RN, CCM, [facility] RN Care Manager [phone]

## 2021-03-17 NOTE — Patient Instructions (Signed)
Visit Information It was nice speaking with you today. Goals Addressed            This Visit's Progress   . Obtain Eye Exam-Diabetes Type 2       Follow Up Date 04/17/21   - keep appointment with eye doctor - schedule appointment with eye doctor    Why is this important?    Eye check-ups are important when you have diabetes.   Vision loss can be prevented.    Notes: 03/17/21- Patient again states she will make appointment the appointment and says she has the phone number to her eye MD       The patient verbalized understanding of instructions, educational materials, and care plan provided today and declined offer to receive copy of patient instructions, educational materials, and care plan.   The care management team will reach out to the patient again over the next 30-60 days.   Kelli Churn RN, CCM, [facility] RN Care Manager [phone]

## 2021-03-18 ENCOUNTER — Other Ambulatory Visit: Payer: Self-pay

## 2021-03-18 DIAGNOSIS — E119 Type 2 diabetes mellitus without complications: Secondary | ICD-10-CM

## 2021-03-18 DIAGNOSIS — I10 Essential (primary) hypertension: Secondary | ICD-10-CM

## 2021-03-18 DIAGNOSIS — E1121 Type 2 diabetes mellitus with diabetic nephropathy: Secondary | ICD-10-CM

## 2021-03-18 MED ORDER — ACCU-CHEK FASTCLIX LANCETS MISC: 3 refills | Status: DC

## 2021-03-18 MED ORDER — ATORVASTATIN CALCIUM 20 MG PO TABS: 20.0000 mg | ORAL_TABLET | Freq: Every day | ORAL | 3 refills | Status: DC

## 2021-03-18 MED ORDER — CARVEDILOL 6.25 MG PO TABS: ORAL_TABLET | ORAL | 3 refills | Status: DC

## 2021-03-18 MED ORDER — VICTOZA 18 MG/3ML ~~LOC~~ SOPN: PEN_INJECTOR | SUBCUTANEOUS | 3 refills | Status: DC

## 2021-03-18 MED ORDER — INSULIN PEN NEEDLE 32G X 4 MM MISC: 1 refills | Status: DC

## 2021-03-18 MED ORDER — METFORMIN HCL 1000 MG PO TABS: ORAL_TABLET | Freq: Two times a day (BID) | ORAL | 3 refills | Status: DC

## 2021-03-18 MED ORDER — ACCU-CHEK GUIDE VI STRP: ORAL_STRIP | 3 refills | Status: DC

## 2021-03-18 MED ORDER — AMLODIPINE BESYLATE 5 MG PO TABS: ORAL_TABLET | Freq: Every day | ORAL | 3 refills | Status: DC

## 2021-03-18 NOTE — Telephone Encounter (Signed)
Pt want all meds to be transferred to [facility].

## 2021-03-23 ENCOUNTER — Other Ambulatory Visit: Payer: Self-pay

## 2021-03-24 ENCOUNTER — Ambulatory Visit: Payer: Medicare Other | Admitting: Licensed Clinical Social Worker

## 2021-03-24 ENCOUNTER — Ambulatory Visit: Payer: Medicare Other | Admitting: *Deleted

## 2021-03-24 DIAGNOSIS — E785 Hyperlipidemia, unspecified: Secondary | ICD-10-CM

## 2021-03-24 DIAGNOSIS — F259 Schizoaffective disorder, unspecified: Secondary | ICD-10-CM

## 2021-03-24 DIAGNOSIS — E119 Type 2 diabetes mellitus without complications: Secondary | ICD-10-CM

## 2021-03-24 DIAGNOSIS — I10 Essential (primary) hypertension: Secondary | ICD-10-CM

## 2021-03-24 NOTE — Patient Instructions (Signed)
Visit Information  Goals Addressed            This Visit's Progress   . Find Help in My Community       Timeframe:  Short-Term Goal Priority:  High Start Date:              03/24/21               Expected End Date:   06/13/21                    Follow Up Date 05/14/21   - follow-up on any referrals for help I am given    Why is this important?    Knowing how and where to find help for yourself or family in your neighborhood and community is an important skill.   You will want to take some steps to learn how.    Notes: patient was referred to [facility] BSW for assist with transportation resources       The patient verbalized understanding of instructions, educational materials, and care plan provided today and declined offer to receive copy of patient instructions, educational materials, and care plan.   The care management team will reach out to the patient again over the next 30-60 days.   Kelli Churn RN, CCM, [facility] RN Care Manager [phone]

## 2021-03-24 NOTE — Chronic Care Management (AMB) (Signed)
Care Management    RN Visit Note  03/24/2021 Name: [name] MRN: [id number] DOB: 12-13-1970  Subjective: [name] is a 50 y.o. year old female who is a primary care patient of Truman Hayward Dimple Nanas, MD. The care management team was consulted for assistance with disease management and care coordination needs.    Consent to Services:   Ms. Skilton was given information about Care Management services today including:  1. Care Management services includes personalized support from designated clinical staff supervised by her physician, including individualized plan of care and coordination with other care providers 2. 24/7 contact phone numbers for assistance for urgent and routine care needs. 3. The patient may stop case management services at any time by phone call to the office staff.  Patient agreed to services and consent obtained.   Assessment: Review of patient past medical history, allergies, medications, health status, including review of consultants reports, laboratory and other test data, was performed as part of comprehensive evaluation and provision of chronic care management services.   SDOH (Social Determinants of Health) assessments and interventions performed:    Care Plan  Allergies  Allergen Reactions  . Bactrim [Sulfamethoxazole-Trimethoprim] Anaphylaxis, Shortness Of Breath and Swelling  . Lisinopril Cough  . Losartan Cough  . Bee Pollen Other (See Comments)    Seasonal allergies  . Ivp Dye [Iodinated Diagnostic Agents] Nausea And Vomiting  . Metrizamide Nausea And Vomiting  . Pollen Extract     Seasonal allergies  . Sulfa Antibiotics Swelling    Eyes and lips swelled up  . Sulfasalazine Swelling    Eyes and lips swelled up  . Other Rash  . Peanut-Containing Drug Products Rash    Outpatient Encounter Medications as of 03/24/2021  Medication Sig Note  . Accu-Chek FastClix Lancets MISC Check blood sugar two times a day   . amLODipine (NORVASC) 5 MG tablet  TAKE 1 TABLET (5 MG TOTAL) BY MOUTH DAILY.   [name] atorvastatin (LIPITOR) 20 MG tablet Take 1 tablet (20 mg total) by mouth daily.   . benztropine (COGENTIN) 0.5 MG tablet Take 0.5 mg by mouth 2 (two) times daily. MORNING (0900) AND NIGHT (2100)   . carvedilol (COREG) 6.25 MG tablet TAKE ONE (1) TABLET BY MOUTH TWO (2) TIMES DAILY WITH A MEAL   . cetirizine (ZYRTEC ALLERGY) 10 MG tablet Take 1 tablet (10 mg total) by mouth daily. 02/16/2021: Takes prn  . DULoxetine (CYMBALTA) 30 MG capsule Take 30 mg by mouth daily.   [name] glucose blood (ACCU-CHEK GUIDE) test strip Check blood sugar two times a day as instructed   . Insulin Pen Needle 32G X 4 MM MISC USE TO INJECT INSULIN ONCE DAILY. DIAGNOSIS CODE E11.21   . liraglutide (VICTOZA) 18 MG/3ML SOPN INJECT 1.8 MG INTO THE SKIN ONCE AS DIRECTED   . metFORMIN (GLUCOPHAGE) 1000 MG tablet TAKE 1 TABLET BY MOUTH 2 TIMES DAILY   . paliperidone (INVEGA SUSTENNA) 156 MG/ML SUSP injection Inject 156 mg into the muscle every 30 (thirty) days.     No facility-administered encounter medications on file as of 03/24/2021.    Patient Active Problem List   Diagnosis Date Noted  . Need for pneumococcal vaccination 02/19/2021  . Hypertension 03/21/2018  . Allergic rhinitis 01/25/2018  . OSA (obstructive sleep apnea) 06/10/2017  . Congenital hearing disorder 12/08/2016  . Schizoaffective disorder, chronic condition ([place]) 06/18/2016  . Hyperlipidemia LDL goal <70 09/10/2014  . Obesity (BMI 30-39.9) 07/30/2014  . Pap smear for cervical cancer  screening 07/30/2014  . Type 2 diabetes mellitus ([place]) 11/07/2012    Conditions to be addressed/monitored: NIDDM, HTN, HLD, Schizoaffective disorder, obesity, OSA-  Care Plan : CCM RN- General Plan of Care (Adult)  Updates made by Barrington Ellison, RN since 03/24/2021 12:00 AM    Problem: Patient has transporation insecurity which results in difficulty being able to access medications and diabetic supplies   Priority: High   Onset Date: 03/24/2021    Goal: Patient will receive assistance from CCM BSW to address transportation insecurity resulting in  inability to obtain medicaitons and diabetic testing supplies   Start Date: 03/24/2021  Expected End Date: 05/14/2021  This Visit's Progress: On track  Priority: High  Note:   Current Barriers:  . Chronic Disease Management support, education, and care coordination needs related to NIDDM, HTN, HLD, Schizoaffective disorder, obesity, OSA . Needs assistance with transportation resources Case Manager Clinical Goal(s):  . patient will work with BSW to address needs related to [employer] knowledge of community resource: related to transportation in patient with NIDDM, HTN, HLD, Schizoaffective disorder, obesity, OSA Interventions:  . Collaborated with BSW to initiate plan of care to address needs related to Transportation in patient with NIDDM, HTN, HLD, Schizoaffective disorder, obesity, OSA- . Collaboration with Mosetta Anis, MD regarding development and update of comprehensive plan of care as evidenced by provider attestation and co-signature . Inter-disciplinary care team collaboration (see longitudinal plan of care) Patient Goals/Self-Care Activities .    - Patient will work with BSW to address care coordination needs and will continue to work with the clinical team to address health care and disease management related needs.   Follow Up Plan: The care management team will reach out to the patient again over the next 30-60 days.       Kelli Churn RN, CCM, [facility] RN Care Manager [phone]

## 2021-03-24 NOTE — Chronic Care Management (AMB) (Signed)
  Care Management   Social Work Visit Note  03/24/2021 Name: [name] MRN: [id number] DOB: 21-Jan-1971  Britzy Graul is a 50 y.o. year old female who sees Mosetta Anis, MD for primary care. The care management team was consulted for assistance with care management and care coordination needs related to Transportation Needs    Patient was given the following information about care management and care coordination services today, agreed to services, and gave verbal consent: 1.care management/care coordination services include personalized support from designated clinical staff supervised by their physician, including individualized plan of care and coordination with other care providers 2. 24/7 contact phone numbers for assistance for urgent and routine care needs. 3. The patient may stop care management/care coordination services at any time by phone call to the office staff.  Engaged with patient by telephone for initial visit in response to provider referral for social work chronic care management and care coordination services.  Assessment: Review of patient history, allergies, and health status during evaluation of patient need for care management/care coordination services.    Interventions:  . Patient interviewed and appropriate assessments performed . Collaborated with clinical team regarding patient needs  . The patient stated she was unaware of where to receive her medications. SW collaborated with the pharmacist [facility]) who called [facility]. [facility] will get her prescriptions from [facility] and have them delivered to the patient's home. The pharmacist contacted the patient to advise and also request the patient to call [facility] to inquire about the delivery process and the patient's med compliance package. A follow-up call from the pharmacist will occur on Friday, May 13th.    SDOH (Social Determinants of Health) assessments performed: Yes     Plan:  . patient will work with  BSW to address needs related to Transportation . Social Worker will follow up with client within 30-days. [name]   SIGNATURE

## 2021-03-24 NOTE — Patient Instructions (Signed)
Visit Information  Instructions: patient will work with SW to address concerns related to transportation  Patient was given the following information about care management and care coordination services today, agreed to services, and gave verbal consent: 1.care management/care coordination services include personalized support from designated clinical staff supervised by their physician, including individualized plan of care and coordination with other care providers 2. 24/7 contact phone numbers for assistance for urgent and routine care needs. 3. The patient may stop care management/care coordination services at any time by phone call to the office staff.  Patient verbalizes understanding of instructions provided today and agrees to view in [place].   Telephone follow up appointment with care management team member scheduled for: Within the next 30-days.  Milus Height, [place]  Social Worker [facility] Care Management  [phone]

## 2021-03-25 ENCOUNTER — Other Ambulatory Visit: Payer: Self-pay | Admitting: Internal Medicine

## 2021-03-25 DIAGNOSIS — E119 Type 2 diabetes mellitus without complications: Secondary | ICD-10-CM

## 2021-03-25 MED ORDER — VICTOZA 18 MG/3ML ~~LOC~~ SOPN: PEN_INJECTOR | SUBCUTANEOUS | 3 refills | Status: DC

## 2021-03-25 MED ORDER — ACCU-CHEK FASTCLIX LANCETS MISC: 3 refills | Status: DC

## 2021-03-25 MED ORDER — ACCU-CHEK GUIDE VI STRP: ORAL_STRIP | 3 refills | Status: DC

## 2021-03-25 NOTE — Telephone Encounter (Signed)
Call to pharmacy  Requesting victoza be dispensed as 84ml (30 day supply) vs the 14ml sent in by [facility].  Verbal auth given to change dispense qty-will have MD reflect change in chart.   Pharmacy unable to bill pt's insurance for test strips and lancets. Asked that rx be sent to [facility] on [address] Will send to MD for review

## 2021-03-25 NOTE — Telephone Encounter (Signed)
Please call the patient pharmacy back about changing the following medication to a 30 day supply rather the 20 day supply that was called in.   liraglutide ([place]) 18 MG/3ML [place], [place]

## 2021-03-26 ENCOUNTER — Other Ambulatory Visit: Payer: Self-pay | Admitting: Student

## 2021-03-26 DIAGNOSIS — E119 Type 2 diabetes mellitus without complications: Secondary | ICD-10-CM

## 2021-03-26 MED ORDER — ACCU-CHEK FASTCLIX LANCETS MISC: 3 refills | Status: DC

## 2021-03-26 MED ORDER — ACCU-CHEK GUIDE VI STRP: ORAL_STRIP | 3 refills | Status: DC

## 2021-03-26 MED ORDER — VICTOZA 18 MG/3ML ~~LOC~~ SOPN: PEN_INJECTOR | SUBCUTANEOUS | 3 refills | Status: DC

## 2021-03-26 NOTE — Addendum Note (Signed)
Addended by: Ebbie Latus on: 03/26/2021 09:23 AM   Modules accepted: Orders

## 2021-03-26 NOTE — Addendum Note (Signed)
Addended by: Mosetta Anis on: 03/26/2021 09:25 AM   Modules accepted: Orders

## 2021-03-29 ENCOUNTER — Encounter: Payer: Self-pay | Admitting: *Deleted

## 2021-03-29 NOTE — Progress Notes (Signed)

## 2021-03-30 NOTE — Progress Notes (Signed)
Things That May Be Affecting Your Health:  Alcohol  Hearing loss  Pain    Depression  Home Safety  Sexual Health  + Diabetes  Lack of physical activity  Stress  + Difficulty with daily activities  Loneliness  Tiredness   Drug use  Medicines  Tobacco use  + Falls  Motor Vehicle Safety  Weight   Food choices  Oral Health  Other    YOUR PERSONALIZED HEALTH PLAN : 1. Schedule your next subsequent Medicare Wellness visit in one year 2. Attend all of your regular appointments to address your medical issues 3. Complete the preventative screenings and services   Annual Wellness Visit   Medicare Covered Preventative Screenings and [place] Men and Women Who How Often Need? Date of Last Service Action  Abdominal Aortic Aneurysm Adults with AAA risk factors Once      Alcohol Misuse and Counseling All Adults Screening once a year if no alcohol misuse. Counseling up to 4 face to face sessions.     Bone Density Measurement  Adults at risk for osteoporosis Once every 2 yrs      Lipid Panel Z13.6 All adults without CV disease Once every 5 yrs       Colorectal Cancer   Stool sample or  Colonoscopy All adults 14 and older   Once every year  Every 10 years Yes       Depression All Adults Once a year  Today   Diabetes Screening Blood glucose, post glucose load, or GTT Z13.1  All adults at risk  Pre-diabetics  Once per year  Twice per year      Diabetes  Self-Management Training All adults Diabetics 10 hrs first year; 2 hours subsequent years. Requires Copay     Glaucoma  Diabetics  Family history of glaucoma  African Americans 1 yrs +  Hispanic Americans 45 yrs + Annually - requires coppay      Hepatitis C Z72.89 or F19.20  High Risk for HCV  Born between 1945 and 1965  Annually  Once Yes     HIV Z11.4 All adults based on risk  Annually btw ages 49 & 74 regardless of risk  Annually > 65 yrs if at increased risk      Lung Cancer Screening  Asymptomatic adults aged 64-77 with 30 pack yr history and current smoker OR quit within the last 15 yrs Annually Must have counseling and shared decision making documentation before first screen      Medical Nutrition Therapy Adults with   Diabetes  Renal disease  Kidney transplant within past 3 yrs 3 hours first year; 2 hours subsequent years     Obesity and Counseling All adults Screening once a year Counseling if BMI 30 or higher  Today   Tobacco Use Counseling Adults who use tobacco  Up to 8 visits in one year     Vaccines Z23  Hepatitis B  Influenza   Pneumonia  Adults   Once  Once every flu season  Two different vaccines separated by one year     Next Annual Wellness Visit People with Medicare Every year  Today     Services & Screenings Women Who How Often Need  Date of Last Service Action  Mammogram  Z12.31 Women over 39 One baseline ages 61-39. Annually ager 40 yrs+      Pap tests All women Annually if high risk. Every 2 yrs for normal risk women  Screening for cervical cancer with   Pap (Z01.419 nl or Z01.411abnl) &  HPV Z11.51 Women aged 42 to 34 Once every 5 yrs     Screening pelvic and breast exams All women Annually if high risk. Every 2 yrs for normal risk women     Sexually Transmitted Diseases  Chlamydia  Gonorrhea  Syphilis All at risk adults Annually for non pregnant females at increased risk         [place] Men Who How Ofter Need  Date of Last Service Action  Prostate Cancer - DRE & PSA Men over 50 Annually.  DRE might require a copay.        Sexually Transmitted Diseases  Syphilis All at risk adults Annually for men at increased risk      Health Maintenance List Health Maintenance  Topic Date Due  . Hepatitis C Screening  Never done  . OPHTHALMOLOGY EXAM  12/27/2017  . COLONOSCOPY (Pts 45-34yrs Insurance coverage will need to be confirmed)  Never done  . COVID-19 Vaccine (3 - Booster for Pfizer series)  12/26/2020  . URINE MICROALBUMIN  05/30/2021  . LIPID PANEL  05/01/2021  . HEMOGLOBIN A1C  05/21/2021  . FOOT EXAM  05/30/2021  . INFLUENZA VACCINE  06/15/2021  . TETANUS/TDAP  09/10/2024  . PAP SMEAR-Modifier  11/12/2025  . PNEUMOCOCCAL POLYSACCHARIDE VACCINE AGE 40-64 HIGH RISK  Completed  . HIV Screening  Completed  . HPV VACCINES  Aged Out

## 2021-04-01 ENCOUNTER — Ambulatory Visit: Payer: Medicare Other | Admitting: Dietician

## 2021-04-04 ENCOUNTER — Encounter: Payer: Self-pay | Admitting: *Deleted

## 2021-04-06 ENCOUNTER — Ambulatory Visit: Payer: Medicare Other | Admitting: *Deleted

## 2021-04-06 DIAGNOSIS — I10 Essential (primary) hypertension: Secondary | ICD-10-CM

## 2021-04-06 DIAGNOSIS — E785 Hyperlipidemia, unspecified: Secondary | ICD-10-CM

## 2021-04-06 DIAGNOSIS — F259 Schizoaffective disorder, unspecified: Secondary | ICD-10-CM

## 2021-04-06 DIAGNOSIS — E119 Type 2 diabetes mellitus without complications: Secondary | ICD-10-CM

## 2021-04-06 NOTE — Chronic Care Management (AMB) (Signed)
Care Management    RN Visit Note  04/06/2021 Name: [name] MRN: [id number] DOB: 08/16/1971  Subjective: [name] is a 50 y.o. year old female who is a primary care patient of Sandy Hayward Dimple Nanas, MD. The care management team was consulted for assistance with disease management and care coordination needs.    Engaged with patient by telephone for follow up visit in response to provider referral for case management and/or care coordination services.   Consent to Services:   [name] was given information about Care Management services today including:  1. Care Management services includes personalized support from designated clinical staff supervised by her physician, including individualized plan of care and coordination with other care providers 2. 24/7 contact phone numbers for assistance for urgent and routine care needs. 3. The patient may stop case management services at any time by phone call to the office staff.  Patient agreed to services and consent obtained.   Assessment: Review of patient past medical history, allergies, medications, health status, including review of consultants reports, laboratory and other test data, was performed as part of comprehensive evaluation and provision of chronic care management services.   SDOH (Social Determinants of Health) assessments and interventions performed:    Care Plan  Allergies  Allergen Reactions  . Bactrim [Sulfamethoxazole-Trimethoprim] Anaphylaxis, Shortness Of Breath and Swelling  . Lisinopril Cough  . Losartan Cough  . Bee Pollen Other (See Comments)    Seasonal allergies  . Ivp Dye [Iodinated Diagnostic Agents] Nausea And Vomiting  . Metrizamide Nausea And Vomiting  . Pollen Extract     Seasonal allergies  . Sulfa Antibiotics Swelling    Eyes and lips swelled up  . Sulfasalazine Swelling    Eyes and lips swelled up  . Other Rash  . Peanut-Containing Drug Products Rash    Outpatient Encounter Medications  as of 04/06/2021  Medication Sig Note  . Accu-Chek FastClix Lancets MISC Check blood sugar two times a day   . amLODipine (NORVASC) 5 MG tablet TAKE 1 TABLET (5 MG TOTAL) BY MOUTH DAILY.   [name] atorvastatin (LIPITOR) 20 MG tablet Take 1 tablet (20 mg total) by mouth daily.   . benztropine (COGENTIN) 0.5 MG tablet Take 0.5 mg by mouth 2 (two) times daily. MORNING (0900) AND NIGHT (2100)   . carvedilol (COREG) 6.25 MG tablet TAKE ONE (1) TABLET BY MOUTH TWO (2) TIMES DAILY WITH A MEAL   . cetirizine (ZYRTEC ALLERGY) 10 MG tablet Take 1 tablet (10 mg total) by mouth daily. 02/16/2021: Takes prn  . DULoxetine (CYMBALTA) 30 MG capsule Take 30 mg by mouth daily.   [name] glucose blood (ACCU-CHEK GUIDE) test strip Check blood sugar two times a day as instructed   . Insulin Pen Needle 32G X 4 MM MISC USE TO INJECT INSULIN ONCE DAILY. DIAGNOSIS CODE E11.21   . liraglutide (VICTOZA) 18 MG/3ML SOPN INJECT 1.8 MG INTO THE SKIN ONCE AS DIRECTED   . metFORMIN (GLUCOPHAGE) 1000 MG tablet TAKE 1 TABLET BY MOUTH 2 TIMES DAILY   . paliperidone (INVEGA SUSTENNA) 156 MG/ML SUSP injection Inject 156 mg into the muscle every 30 (thirty) days.     No facility-administered encounter medications on file as of 04/06/2021.    Patient Active Problem List   Diagnosis Date Noted  . Need for pneumococcal vaccination 02/19/2021  . Hypertension 03/21/2018  . Allergic rhinitis 01/25/2018  . OSA (obstructive sleep apnea) 06/10/2017  . Congenital hearing disorder 12/08/2016  . Schizoaffective disorder, chronic  condition ([place]) 06/18/2016  . Hyperlipidemia LDL goal <70 09/10/2014  . Obesity (BMI 30-39.9) 07/30/2014  . Pap smear for cervical cancer screening 07/30/2014  . Type 2 diabetes mellitus ([place]) 11/07/2012    Conditions to be addressed/monitored: NIDDM, HTN, HLD, Schizoaffective disorder, obesity, OSA  Care Plan : CCM RN- Diabetes Type 2 (Adult)  Updates made by Barrington Ellison, RN since 04/06/2021 12:00 AM    Problem:  Disease Progression (Diabetes, Type 2)     Long-Range Goal: Disease Progression Prevented or Minimized   Start Date: 02/16/2021  Recent Progress: Not on track  Priority: High  Note:   Objective:  Lab Results  Component Value Date   HGBA1C 12.7 (A) 11/12/2020 .   Lab Results  Component Value Date   CREATININE 0.59 10/06/2020   CREATININE 0.65 05/01/2020   CREATININE 0.62 05/29/2019 .   [name] No results found for: EGFR Current Barriers:  [name] Knowledge Deficits related to basic Diabetes pathophysiology and self care/management- successful outreach to patient to complete follow up CCM assessment, she states she is not checking her blood sugars because she went to [facility] and she says they weren't able to give her the testing supplies but she doesn't know why, she said she thinks her insurance will not pay for the supplies, she says her medications are now being delivered to her home by the [facility] she says she receives help from the [facility] staff to manage her finances and to assist with managing her mental health issues. Confirms she walks for 30 minutes daily, she is aware she has an appointment with Debera Lat the clinic RD, [facility] on Wednesday 5/25 and verified that she has arranged transportation . Does not have glucometer to monitor blood sugar . Unable to perform IADLs independently Case Manager Clinical Goal(s):  . patient will demonstrate improved adherence to prescribed treatment plan for diabetes self care/management as evidenced by: adherence to ADA/ carb modified diet exercise 3-5 days/week adherence to prescribed medication regimen contacting provider for new or worsened symptoms or questions Interventions:  . Collaboration with Mosetta Anis, MD regarding development and update of comprehensive plan of care as evidenced by provider attestation and co-signature . Inter-disciplinary care team collaboration (see longitudinal plan of care) . Reviewed medications with  patient,  reinforced with patient that Victoza is a non insulin injection that helps with blood sugar control and should not caused low blood sugar . Ensured she received her medications via home delivery; assessed medication taking behavior . Reviewed importance of continuing her daily walks, eliminating calories via drinks, increasing her consumption of fruits and vegetables  . Reviewed Medicare and Medicaid benefits re: coverage of diabetes testing supplies and importance getting testing supplies so she can begin testing her blood sugars . Reviewed scheduled/upcoming provider appointments including: 04/08/21 at 9:15 am with [facility] clinic RD, [place] and ensured she has transportation,  again encouraged her to write down her appointments in the [place] [facility] calendar . Reinforced need to schedule annual eye exam . Review of patient status, including review of consultants reports, relevant laboratory and other test results, and medications completed. Self-Care Activities - UNABLE to independently manage diabetes to meet treatment goals Patient Goals:  - change to whole grain breads, cereal, pasta - drink 6 to 8 glasses of water each day - limit fast food meals to no more than 1 per week - switch to sugar-free drinks - keep appointment with Butch Penny on 04/08/21 - schedule  appointment with eye doctor, write the appointment down in the [facility] calendar and keep appointment - check feet daily for cuts, sores or redness - keep feet up while sitting - wash and dry feet carefully every day - wear comfortable, well-fitting shoes Follow Up Plan: The care management team will reach out to the patient again over the next 30-60 days.       Kelli Churn RN, CCM, [facility] RN Care Manager [phone]

## 2021-04-06 NOTE — Patient Instructions (Signed)
Visit Information It was nice speaking with you today. Goals Addressed            This Visit's Progress   . Find Help in My Community       Timeframe:  Short-Term Goal Priority:  High Start Date:              03/24/21               Expected End Date:   06/13/21                    Follow Up Date 05/14/21   - follow-up on any referrals for help I am given    Why is this important?    Knowing how and where to find help for yourself or family in your neighborhood and community is an important skill.   You will want to take some steps to learn how.    Notes: patient was referred to [facility] BSW for assist with transportation resources and she spoke with BSW on 03/24/21    . Obtain Eye Exam-Diabetes Type 2       Follow Up Date 05/14/21   - keep appointment with eye doctor - schedule appointment with eye doctor    Why is this important?    Eye check-ups are important when you have diabetes.   Vision loss can be prevented.    Notes: 04/06/21- Patient again states she will make appointment the appointment and says she has the phone number to her eye MD       The patient verbalized understanding of instructions, educational materials, and care plan provided today and declined offer to receive copy of patient instructions, educational materials, and care plan.   The care management team will reach out to the patient again over the next 30-60 days.   Kelli Churn RN, CCM, [facility] RN Care Manager [phone]

## 2021-04-08 ENCOUNTER — Ambulatory Visit (INDEPENDENT_AMBULATORY_CARE_PROVIDER_SITE_OTHER): Payer: Medicare Other | Admitting: Dietician

## 2021-04-08 ENCOUNTER — Other Ambulatory Visit: Payer: Self-pay

## 2021-04-08 ENCOUNTER — Other Ambulatory Visit: Payer: Self-pay | Admitting: Dietician

## 2021-04-08 ENCOUNTER — Encounter: Payer: Self-pay | Admitting: Dietician

## 2021-04-08 DIAGNOSIS — E119 Type 2 diabetes mellitus without complications: Secondary | ICD-10-CM

## 2021-04-08 DIAGNOSIS — Z713 Dietary counseling and surveillance: Secondary | ICD-10-CM

## 2021-04-08 LAB — GLUCOSE, CAPILLARY: Glucose-Capillary: 146 mg/dL — ABNORMAL HIGH (ref 70–99)

## 2021-04-08 MED ORDER — ACCU-CHEK FASTCLIX LANCETS MISC: 3 refills | Status: DC

## 2021-04-08 MED ORDER — ACCU-CHEK GUIDE VI STRP: ORAL_STRIP | 3 refills | Status: DC

## 2021-04-08 NOTE — Progress Notes (Signed)
Medical Nutrition Therapy:  Appt start time: 920 end time:  0955 Total time spent: 35 minutes Visit # 5, last visit 02/2021  Assessment:  Primary concerns today: glucose control  Ms. Youngren states she is not checking her blood sugar because she still does not have test strips and  is taking her medicines as directed. She is unhappy that her weight is not decreasing. She is drinking sugar free beverages and water. She is eating high calorie foods.   Today her blood sugar was 184 mg/dL after eating.  She did not meet her goal  to choose fruit and vegetables for snacks and sides and continue to walk for 30 minutes a day.   24-hr recall:  (Up at  AM) B ( AM)- egg sandwich with mayo, chocolate milk  Snk ( AM)-   L ( PM)- oodles of noodles, water  Snk ( PM)-  Lorna done cookie pack- 210 calroes) D ( PM)- hiot dog on bun with fries and hawain punch  Snk ( PM)-   Typical day? Yes.    ANTHROPOMETRICS: Estimated body mass index is 38.14 kg/m as calculated from the following:   Height as of 02/19/21: 5\' 4"  (1.626 m).   Weight as of this encounter: 222 lb 3.2 oz (100.8 kg).  Wt Readings from Last 10 Encounters:  04/08/21 222 lb 3.2 oz (100.8 kg)  03/02/21 222 lb (100.7 kg)  02/19/21 215 lb (97.5 kg)  02/06/21 219 lb (99.3 kg)  01/14/21 216 lb 1.6 oz (98 kg)  11/12/20 212 lb 9.6 oz (96.4 kg)  05/30/20 225 lb 3.2 oz (102.2 kg)  05/01/20 223 lb (101.2 kg)  02/05/20 223 lb 1.6 oz (101.2 kg)  01/18/20 223 lb 9.6 oz (101.4 kg)   RBW- 150-160# MEDICATIONS: victoza 1.8 mg/day daily in am      Metformin 1000 mg twice a day .                              Lab Results  Component Value Date   HGBA1C 8.4 (A) 02/19/2021   HGBA1C 12.7 (A) 11/12/2020   HGBA1C 9.3 (A) 05/01/2020   HGBA1C 7.7 (A) 08/02/2019   HGBA1C 8.0 (A) 05/29/2019     Progress Towards Goal(s):  Some progress.   Nutritional Diagnosis:  NI-1.5 Excessive energy intake As related to excess added sugar and starches is slowly  improving As evidenced by her decreased blood sugar and report of switching to low sugar juices  Intervention: Nutrition education about ways to decrease her weight by adjusting her food choices  And diabetes self managment support. Coordination of care: requested prescription for testing suplies be sent to [place]. And the pharmacy they were sent to cannot fill them.  Suggest switching to semaglutide for additional blood glucose and weight lowering and less frequent injections.   Teaching Method Utilized: Visual, Auditory,Hands on Handouts given during visit include: AVS Barriers to learning/adherence to lifestyle change:limited finances,  mental health issues, medicines and social situation Demonstrated degree of understanding via:  Teach Back   Monitoring/Evaluation:  Dietary intake, exercise, meter, and body weight in 4 week(s). Debera Lat, RD 04/08/2021 4:57 PM.

## 2021-04-08 NOTE — Progress Notes (Signed)
Referral request for additional hours of Medical Nutrition Therapy

## 2021-04-08 NOTE — Patient Instructions (Addendum)
Eye Exam- Reminder that you have an appointment  on Monday,  08/10/2021 @ 1015M With: Warden Fillers, MD Where: [facility],  [address] , [address], [place] [address] Contact Number: [phone]  Please stop at the front desk to schedule with me and your doctor on or after July 7th.   Eat more veggies, lean protein like beans, chicken, fish or [place] and  fruits and drink more low calorie drinks to help you with weight loss.   Wt Readings from Last 10 Encounters:  04/08/21 222 lb 3.2 oz (100.8 kg)  03/02/21 222 lb (100.7 kg)  02/19/21 215 lb (97.5 kg)  02/06/21 219 lb (99.3 kg)  01/14/21 216 lb 1.6 oz (98 kg)  11/12/20 212 lb 9.6 oz (96.4 kg)  05/30/20 225 lb 3.2 oz (102.2 kg)  05/01/20 223 lb (101.2 kg)  02/05/20 223 lb 1.6 oz (101.2 kg)  01/18/20 223 lb 9.6 oz (101.4 kg)   [name] [phone]

## 2021-04-08 NOTE — Telephone Encounter (Signed)
Please sign to send testing supplies to [facility].

## 2021-04-23 ENCOUNTER — Ambulatory Visit: Payer: Medicare Other | Admitting: Licensed Clinical Social Worker

## 2021-04-23 NOTE — Chronic Care Management (AMB) (Signed)
  Care Management   Social Work Visit Note  04/23/2021 Name: Nashea Chumney MRN: [id number] DOB: [birth year]  Mckynzie Liwanag is a 50 y.o. year old female who sees Mosetta Anis, MD for primary care. The care management team was consulted for assistance with care management and care coordination needs related to  community resources    Assessment: Review of patient history, allergies, and health status during evaluation of patient need for care management/care coordination services.    Interventions:  SW attempted contact with patient, but was unsuccessful.  SW reviewed chart. SW previously collaborated with pharmacist. Pharmacist assisted with medication delivery from [facility].  SW attempted contact to verify patient received medications. SW unable to leave a VM.      Plan:  SW will follow up in 30-days.    Milus Height, [place]  Social Worker [facility] Care Management  [phone]

## 2021-05-05 ENCOUNTER — Ambulatory Visit: Payer: Medicare Other | Admitting: *Deleted

## 2021-05-05 ENCOUNTER — Telehealth: Payer: Medicare Other

## 2021-05-05 DIAGNOSIS — E119 Type 2 diabetes mellitus without complications: Secondary | ICD-10-CM

## 2021-05-05 DIAGNOSIS — E785 Hyperlipidemia, unspecified: Secondary | ICD-10-CM

## 2021-05-05 DIAGNOSIS — F259 Schizoaffective disorder, unspecified: Secondary | ICD-10-CM

## 2021-05-05 DIAGNOSIS — I10 Essential (primary) hypertension: Secondary | ICD-10-CM

## 2021-05-05 NOTE — Patient Instructions (Addendum)
Visit Information   Goals Addressed             This Visit's Progress    Find Help in My Community       Timeframe:  Short-Term Goal Priority:  High Start Date:              03/24/21               Expected End Date:   06/13/21                    Follow Up Date 05/14/21   - follow-up on any referrals for help I am given    Why is this important?   Knowing how and where to find help for yourself or family in your neighborhood and community is an important skill.  You will want to take some steps to learn how.    Notes: patient was referred to [facility] BSW for assist with transportation resources and she spoke with BSW on 03/24/21; she will reach out in one month to provide transportation [place] Exam-Diabetes Type 2       Follow Up Date 06/13/21   - keep appointment with eye doctor - schedule appointment with eye doctor    Why is this important?   Eye check-ups are important when you have diabetes.  Vision loss can be prevented.   Notes: 05/05/21- Patient states Debera Lat assisted her with securing an opthalmology appointment in September.          The patient verbalized understanding of instructions, educational materials, and care plan provided today and declined offer to receive copy of patient instructions, educational materials, and care plan.   The care management team will reach out to the patient again over the next 30 days.   Kelli Churn RN, CCM, [facility] RN Care Manager [phone]    Visit Information   Goals Addressed             This Visit's Progress    Find Help in My Community       Timeframe:  Short-Term Goal Priority:  High Start Date:              03/24/21               Expected End Date:   06/13/21                    Follow Up Date 05/14/21   - follow-up on any referrals for help I am given    Why is this important?   Knowing how and where to find help for yourself or family in your neighborhood and community  is an important skill.  You will want to take some steps to learn how.    Notes: patient was referred to [facility] BSW for assist with transportation resources and she spoke with BSW on 03/24/21; she will reach out in one month to provide transportation [place] Exam-Diabetes Type 2       Follow Up Date 06/13/21   - keep appointment with eye doctor - schedule appointment with eye doctor    Why is this important?   Eye check-ups are important when you have diabetes.  Vision loss can be prevented.   Notes: 05/05/21- Patient states Debera Lat assisted her with securing an opthalmology appointment in September.  The patient verbalized understanding of instructions, educational materials, and care plan provided today and declined offer to receive copy of patient instructions, educational materials, and care plan.   The care management team will reach out to the patient again over the next 30 days.   Kelli Churn RN, CCM, [facility] RN Care Manager [phone]

## 2021-05-05 NOTE — Chronic Care Management (AMB) (Addendum)
Care Management    RN Visit Note  05/05/2021 Name: [name] MRN: [id number] DOB: 1971-06-07  Subjective: [name] is a 50 y.o. year old female who is a primary care patient of [name] Sandy Nanas, MD. The care management team was consulted for assistance with disease management and care coordination needs.    Engaged with patient by telephone for follow up visit in response to provider referral for case management and/or care coordination services.   Consent to Services:   [name] was given information about Care Management services today including:  Care Management services includes personalized support from designated clinical staff supervised by her physician, including individualized plan of care and coordination with other care providers 24/7 contact phone numbers for assistance for urgent and routine care needs. The patient may stop case management services at any time by phone call to the office staff.  Patient agreed to services and consent obtained.   Assessment: Review of patient past medical history, allergies, medications, health status, including review of consultants reports, laboratory and other test data, was performed as part of comprehensive evaluation and provision of chronic care management services.   SDOH (Social Determinants of Health) assessments and interventions performed:    Care Plan  Allergies  Allergen Reactions   Bactrim [Sulfamethoxazole-Trimethoprim] Anaphylaxis, Shortness Of Breath and Swelling   Lisinopril Cough   Losartan Cough   Bee Pollen Other (See Comments)    Seasonal allergies   Ivp Dye [Iodinated Diagnostic Agents] Nausea And Vomiting   Metrizamide Nausea And Vomiting   Pollen Extract     Seasonal allergies   Sulfa Antibiotics Swelling    Eyes and lips swelled up   Sulfasalazine Swelling    Eyes and lips swelled up   Other Rash   Peanut-Containing Drug Products Rash    Outpatient Encounter Medications as of 05/05/2021   Medication Sig Note   Accu-Chek FastClix Lancets MISC Check blood sugar one times a day    amLODipine (NORVASC) 5 MG tablet TAKE 1 TABLET (5 MG TOTAL) BY MOUTH DAILY.    atorvastatin (LIPITOR) 20 MG tablet Take 1 tablet (20 mg total) by mouth daily.    benztropine (COGENTIN) 0.5 MG tablet Take 0.5 mg by mouth 2 (two) times daily. MORNING (0900) AND NIGHT (2100)    carvedilol (COREG) 6.25 MG tablet TAKE ONE (1) TABLET BY MOUTH TWO (2) TIMES DAILY WITH A MEAL    cetirizine (ZYRTEC ALLERGY) 10 MG tablet Take 1 tablet (10 mg total) by mouth daily. 02/16/2021: Takes prn   DULoxetine (CYMBALTA) 30 MG capsule Take 30 mg by mouth daily.    glucose blood (ACCU-CHEK GUIDE) test strip Check blood sugar one times a day as instructed    Insulin Pen Needle 32G X 4 MM MISC USE TO INJECT INSULIN ONCE DAILY. DIAGNOSIS CODE E11.21    liraglutide (VICTOZA) 18 MG/3ML SOPN INJECT 1.8 MG INTO THE SKIN ONCE AS DIRECTED    metFORMIN (GLUCOPHAGE) 1000 MG tablet TAKE 1 TABLET BY MOUTH 2 TIMES DAILY    paliperidone (INVEGA SUSTENNA) 156 MG/ML SUSP injection Inject 156 mg into the muscle every 30 (thirty) days.     No facility-administered encounter medications on file as of 05/05/2021.    Patient Active Problem List   Diagnosis Date Noted   Need for pneumococcal vaccination 02/19/2021   Hypertension 03/21/2018   Allergic rhinitis 01/25/2018   OSA (obstructive sleep apnea) 06/10/2017   Congenital hearing disorder 12/08/2016   Schizoaffective disorder, chronic condition ([place]) 06/18/2016  Hyperlipidemia LDL goal <70 09/10/2014   Obesity (BMI 30-39.9) 07/30/2014   Pap smear for cervical cancer screening 07/30/2014   Type 2 diabetes mellitus ([place]) 11/07/2012    Conditions to be addressed/monitored:  NIDDM, HTN, HLD, Schizoaffective disorder, obesity, OSA  Care Plan : CCM RN- Diabetes Type 2 (Adult)  Updates made by Barrington Ellison, RN since 05/05/2021 12:00 AM     Problem: Disease Progression (Diabetes, Type 2)       Long-Range Goal: Disease Progression Prevented or Minimized   Start Date: 02/16/2021  Recent Progress: Not on track  Priority: High  Note:   Objective:   Lab Results  Component Value Date   HGBA1C 8.4 (A) 02/19/2021   HGBA1C 12.7 (A) 11/12/2020   HGBA1C 9.3 (A) 05/01/2020   Lab Results  Component Value Date   MICROALBUR 0.67 07/30/2014   LDLCALC 81 05/01/2020   CREATININE 0.59 10/06/2020    Current Barriers:  Knowledge Deficits related to basic Diabetes pathophysiology and self care/management- successful outreach to patient to complete follow up CCM assessment, she states she was finally able to get her Dm testing supplies and is checking a fasting every morning, she reports the last 2 fasting values as 123 and 164, she reports good medication taking behavior, she says her medications are now being delivered to her home by the [facility]), she says she receives help from the [facility] staff to manage her finances and to assist with managing her mental health issues. Confirms she walks for 30 minutes daily, she is aware she has an appointment with [name] the clinic RD, [facility] on Wednesday 6/30 and will arrange transportation for the appointment tomorrow, she says she struggles with reducing her sweetened drink intake Unable to perform IADLs independently Case Manager Clinical Goal(s):  patient will demonstrate improved adherence to prescribed treatment plan for diabetes self care/management as evidenced by: adherence to ADA/ carb modified diet exercise 3-5 days/week adherence to prescribed medication regimen contacting provider for new or worsened symptoms or questions Interventions:  Collaboration with Mosetta Anis, MD regarding development and update of comprehensive plan of care as evidenced by provider attestation and co-signature Inter-disciplinary care team collaboration (see longitudinal plan of care) Reviewed medications with patient , ensured she receives  her medications via home delivery and assessed medication taking behavior Reviewed importance of continuing her daily walks, eliminating calories via drinks, increasing her consumption of fruits and vegetables  Positive reinforcement provided to patient for getting testing supplies and testing her blood sugars Reviewed fasting CBG target Reviewed scheduled/upcoming provider appointments including: 05/14/21 with [facility] Lee clinic RD, [place] and ensured she has transportation,  again encouraged her to write down her appointments in the [facility] Management calendar Assessed status of  scheduling her annual eye exam and discussed importance of keeping appointment to assess for DM eye disease Review of patient status, including review of consultants reports, relevant laboratory and other test results, and medications completed. Informed patient role of clinic CCM RN will transition from this CCM RN to Johnney Killian CCM RN as of 06/01/21. Ensured patient has contact number for CCM team and clinic Self-Care Activities - UNABLE to independently manage diabetes to meet treatment goals Patient Goals:  - change to whole grain breads, cereal, pasta - drink 6 to 8 glasses of water each day - limit fast food meals to no more than 1 per week - switch to sugar-free drinks - keep appointment with Butch Penny on 05/14/21 -  keep appointment with eye doctor, write the appointment down in the [facility] Management calendar and keep appointment - check feet daily for cuts, sores or redness - keep feet up while sitting - wash and dry feet carefully every day - wear comfortable, well-fitting shoes Follow Up Plan: The care management team will reach out to the patient again over the next 30-60 days.      Care Plan : CCM RN- General Plan of Care (Adult)  Updates made by Barrington Ellison, RN since 05/05/2021 12:00 AM     Problem: Patient has transporation insecurity which results in  difficulty being able to access medications and diabetic supplies   Priority: High  Onset Date: 03/24/2021     Goal: Patient will receive assistance from CCM BSW to address transportation insecurity resulting in  inability to obtain medicaitons and diabetic testing supplies   Start Date: 03/24/2021  Expected End Date: 05/14/2021  This Visit's Progress: Not on track  Recent Progress: On track  Priority: High  Note:   Current Barriers:  Chronic Disease Management support, education, and care coordination needs related to NIDDM, HTN, HLD, Schizoaffective disorder, obesity, OSA Needs assistance with transportation resources- As of 05/05/21 per BSW documentation, she has been unsuccessful in reaching patient to provide transportation resources Case Manager Clinical Goal(s):  patient will work with BSW to address needs related to [employer] knowledge of community resource: related to transportation in patient with NIDDM, HTN, HLD, Schizoaffective disorder, obesity, OSA Interventions:  Collaborated with BSW to initiate plan of care to address needs related to Transportation in patient with NIDDM, HTN, HLD, Schizoaffective disorder, obesity, OSA- Collaboration with Mosetta Anis, MD regarding development and update of comprehensive plan of care as evidenced by provider attestation and co-signature Inter-disciplinary care team collaboration (see longitudinal plan of care) Patient Goals/Self-Care Activities   - Patient will work with BSW to address care coordination needs and will continue to work with the clinical team to address health care and disease management related needs.   Follow Up Plan: The care management team will reach out to the patient again over the next 30-60 days.       Plan: The care management team will reach out to the patient again over the next 30 days.  Kelli Churn RN, CCM, [facility] RN Care Manager [phone]

## 2021-05-07 ENCOUNTER — Encounter: Payer: Medicare Other | Admitting: Dietician

## 2021-05-14 ENCOUNTER — Encounter: Payer: Self-pay | Admitting: Dietician

## 2021-05-14 ENCOUNTER — Telehealth: Payer: Medicare Other

## 2021-05-14 ENCOUNTER — Ambulatory Visit (INDEPENDENT_AMBULATORY_CARE_PROVIDER_SITE_OTHER): Payer: Medicare Other | Admitting: Dietician

## 2021-05-14 ENCOUNTER — Other Ambulatory Visit: Payer: Self-pay

## 2021-05-14 DIAGNOSIS — E119 Type 2 diabetes mellitus without complications: Secondary | ICD-10-CM | POA: Diagnosis not present

## 2021-05-14 DIAGNOSIS — Z124 Encounter for screening for malignant neoplasm of cervix: Secondary | ICD-10-CM | POA: Diagnosis not present

## 2021-05-14 NOTE — Progress Notes (Addendum)
Medical Nutrition Therapy:  Appt start time: 900 end time:  0952 Total time spent: 52 minutes Visit # 5, last visit 02/2021  Assessment:  Primary concerns today: glucose control  Ms. Poth states she is now checking her blood sugar because she was abel to gt her strips from [place]. She is taking her medicines as directed. Her weight is unchanged and she would like it and her blood sugars to be lower. She says  problems/barriers to reaching these goals is being very hungry a lot,  eating out, drinking regular soda and sugar sweetened beverages.She spends most of her time with her son and sister and they both eat out often and drink sugar sweetened beverages and water. She is eating and drinking high calorie/sugar foods and beverages.   This morning her blood sugar was 238 fasting after chinese take out a last night or rice, sesame chicken and egg roll.   She is not walking because it is too hot, she was encouraged to move after eating and not sit down for at least 30 minutes a day.   24-hr recall:  (Up at  7 AM) B ( AM)- egg, sausage, regular 20 oz mt dew or 12 oz whole milk/ ate at [employer] today- sausage & egg bagel sweet green tea L ( PM)- fried bologna sandwich, water or a can of Dr. Dorthula Nettles ( PM)-ice cream D ( PM)- frozen meals/ take out meatloaf, mashed pot w/ gravy, fried okra  Typical day? Yes.    ANTHROPOMETRICS: Estimated body mass index is 38.14 kg/m as calculated from the following:   Height as of 02/19/21: 5\' 4"  (1.626 m).   Weight as of 04/08/21: 222 lb 3.2 oz (100.8 kg).  Wt Readings from Last 10 Encounters:  04/08/21 222 lb 3.2 oz (100.8 kg)  03/02/21 222 lb (100.7 kg)  02/19/21 215 lb (97.5 kg)  02/06/21 219 lb (99.3 kg)  01/14/21 216 lb 1.6 oz (98 kg)  11/12/20 212 lb 9.6 oz (96.4 kg)  05/30/20 225 lb 3.2 oz (102.2 kg)  05/01/20 223 lb (101.2 kg)  02/05/20 223 lb 1.6 oz (101.2 kg)  01/18/20 223 lb 9.6 oz (101.4 kg)   RBW- 150-160# MEDICATIONS: victoza 1.8 mg/day  daily in am      Metformin 1000 mg twice a day METER DOWNLOAD  Report summary is from last 30 days,  Average tests per day: 0.8 Average blood glucose: 162 Range: minimum: 100 and maximum: 238 Days without test: 7 % in target range: 19/23 % below target range: 0 % above target range: 4/23 % hypoglycemia: 0 Notes about patterns: tests through the day, highest mid-sleep and stable ~ 160 during the day                         Lab Results  Component Value Date   HGBA1C 8.4 (A) 02/19/2021   HGBA1C 12.7 (A) 11/12/2020   HGBA1C 9.3 (A) 05/01/2020   HGBA1C 7.7 (A) 08/02/2019   HGBA1C 8.0 (A) 05/29/2019     Progress Towards Goal(s):  Some progress.   Nutritional Diagnosis:  NI-1.5 Excessive energy intake As related to excess added sugar and starches is slowly improving As evidenced by her decreased blood sugar and report of switching to low sugar juices  Intervention: Nutrition education about ways to decrease her weight by adjusting her food choices  And diabetes self managment support. Coordination of care: requested prescription for testing suplies be sent to [facility]. And the  pharmacy they were sent to cannot fill them.  Suggest switching to semaglutide for additional blood glucose and weight lowering and less frequent injections. Consider inviting son and sister to future visits  Teaching Method Utilized: Visual, Auditory,Hands on Handouts given during visit include: AVS Barriers to learning/adherence to lifestyle change:limited finances,  mental health issues, medicines and social situation Demonstrated degree of understanding via:  Teach Back   Monitoring/Evaluation:  Dietary intake, exercise, meter, and body weight in 4 week(s). Butch Penny Aron Inge, RD 05/14/2021 10:12 AM.

## 2021-06-02 ENCOUNTER — Telehealth: Payer: Medicare Other

## 2021-06-03 ENCOUNTER — Ambulatory Visit: Payer: Medicare Other | Admitting: Pharmacist

## 2021-06-03 ENCOUNTER — Telehealth: Payer: Self-pay | Admitting: Pharmacist

## 2021-06-03 NOTE — Telephone Encounter (Signed)
Attempted to contact patient for appt this AM. Patient did not answer and unable to leave voicemail.

## 2021-06-11 ENCOUNTER — Encounter: Payer: Self-pay | Admitting: Dietician

## 2021-06-11 ENCOUNTER — Ambulatory Visit (INDEPENDENT_AMBULATORY_CARE_PROVIDER_SITE_OTHER): Payer: Medicare Other | Admitting: Dietician

## 2021-06-11 ENCOUNTER — Other Ambulatory Visit: Payer: Self-pay

## 2021-06-11 VITALS — Wt 219.5 lb

## 2021-06-11 DIAGNOSIS — E119 Type 2 diabetes mellitus without complications: Secondary | ICD-10-CM | POA: Diagnosis present

## 2021-06-11 LAB — POCT GLYCOSYLATED HEMOGLOBIN (HGB A1C): Hemoglobin A1C: 7.9 % — AB (ref 4.0–5.6)

## 2021-06-11 LAB — GLUCOSE, CAPILLARY: Glucose-Capillary: 167 mg/dL — ABNORMAL HIGH (ref 70–99)

## 2021-06-11 NOTE — Patient Instructions (Addendum)
Healthier options to replace ice cream- ice pops, sugar free popsicles, frozen bananas-   How to make frozen bananas-  1- peel the banana 2- break into bite sized pieces 3- put in a bowl or plastic container with a lid 4- Let get frozen for at least an hour- then eat and enjoy  You can also freeze grapes.  Please bring one of the iced tea packets to your next visit.   It sounds like you can get an appointment with your doctor in September.  Please mae an appointment with me on the same day.   Call your pharmacy to make an appointment for your covid vaccine booster.   Lab Results  Component Value Date   HGBA1C 7.9 (A) 06/11/2021   HGBA1C 8.4 (A) 02/19/2021   HGBA1C 12.7 (A) 11/12/2020   HGBA1C 9.3 (A) 05/01/2020   HGBA1C 7.7 (A) 08/02/2019     Good job!!!  Freight forwarder [phone]

## 2021-06-11 NOTE — Progress Notes (Signed)
Medical Nutrition Therapy:  Appt start time: 810 AM end time:  [id number] AM Total time spent: 40 minutes Visit # 6  Assessment:  Primary concerns today: glucose control  Ms. Rosenbloom brought her meter today. She is taking her medicines as directed. Her weight is decreased 3#; she would like it and her blood sugars to be lower. She was encouraged to move more at today's visit.  Eating out less, eats at [employer] moreso eating more lean meats. Drinking diet drinks.   24-hr recall:  (Up at  530 AM) B (730 AM)- ate egg, sausage biscuit and got blueberry muffin at [facility] today for later L ( PM)-  bologna sandwich, water or a can of Dr. Malachi Bonds Snk ( PM)-ice cream D ( PM)- 12' sub [place] and cheese, tomato and lettuce Typical day? Yes.    ANTHROPOMETRICS: Estimated body mass index is 37.68 kg/m as calculated from the following:   Height as of 02/19/21: '5\' 4"'$  (1.626 m).   Weight as of this encounter: 219 lb 8 oz (99.6 kg).  Wt Readings from Last 10 Encounters:  06/11/21 219 lb 8 oz (99.6 kg)  05/14/21 222 lb 4.8 oz (100.8 kg)  04/08/21 222 lb 3.2 oz (100.8 kg)  03/02/21 222 lb (100.7 kg)  02/19/21 215 lb (97.5 kg)  02/06/21 219 lb (99.3 kg)  01/14/21 216 lb 1.6 oz (98 kg)  11/12/20 212 lb 9.6 oz (96.4 kg)  05/30/20 225 lb 3.2 oz (102.2 kg)  05/01/20 223 lb (101.2 kg)   RBW- 150-160# MEDICATIONS: victoza 1.8 mg/day daily in am      Metformin 1000 mg twice a day METER DOWNLOAD Report summary is from last 30 days,  Average tests per day: 0.7 Average blood glucose: 153 Range: minimum: 117 and maximum: 238 % in target range: 19/22 % below target range: 0 % above target range: 3/22 % hypoglycemia: 0 Notes about patterns: tests this month are all before 3 PM, there is discordance between smbg and A1C                         Lab Results  Component Value Date   HGBA1C 7.9 (A) 06/11/2021   HGBA1C 8.4 (A) 02/19/2021   HGBA1C 12.7 (A) 11/12/2020   HGBA1C 9.3 (A) 05/01/2020   HGBA1C 7.7 (A)  08/02/2019    Progress Towards Goal(s):  Some progress.   Nutritional Diagnosis:  NI-1.5 Excessive energy intake As related to excess added sugar and starches is slowly improving As evidenced by her decreased blood sugar and report of switching to low sugar juices, more diet drinks and weight loss  Intervention: Nutrition education about ways to decrease her weight by adjusting her food choices  and increasing activity to maintain weight loss.. Coordination of care: A1C and microalbumin checks done per okay of SDr. Dareen Piano. Foot exam done today.  Suggest switching to semaglutide for additional blood glucose and weight lowering and less frequent injections. Consider inviting son and sister to future visits  Teaching Method Utilized: Visual, Auditory,Hands on Handouts given during visit include: AVS Barriers to learning/adherence to lifestyle change:limited finances,  mental health issues, medicines and social situation Demonstrated degree of understanding via:  Teach Back   Monitoring/Evaluation:  Dietary intake, exercise, meter, and body weight in 8 week(s). Debera Lat, RD 06/11/2021 10:55 AM.

## 2021-06-12 LAB — MICROALBUMIN / CREATININE URINE RATIO
Creatinine, Urine: 168.1 mg/dL
Microalb/Creat Ratio: 16 mg/g creat (ref 0–29)
Microalbumin, Urine: 27.6 ug/mL

## 2021-06-15 ENCOUNTER — Telehealth: Payer: Self-pay

## 2021-06-15 ENCOUNTER — Telehealth: Payer: Medicare Other

## 2021-06-15 NOTE — Telephone Encounter (Signed)
  Care Management   Outreach Note  06/15/2021 Name: Raeden Battie MRN: [id number] DOB: 29-Apr-1971  Referred by: Orvis Brill, MD Reason for referral : No chief complaint on file.   An unsuccessful telephone outreach was attempted today. The patient was referred to the case management team for assistance with care management and care coordination.   Follow Up Plan: [place] to reschedule appointment with patient.  Johnney Killian, RN, BSN, CCM Care Management Coordinator [facility] Internal Medicine Phone: [phone] / Fax: [phone]

## 2021-06-16 ENCOUNTER — Telehealth: Payer: Self-pay | Admitting: *Deleted

## 2021-06-16 NOTE — Chronic Care Management (AMB) (Signed)
  Care Management   Note  06/16/2021 Name: Jailee Galindez MRN: [id number] DOB: 1971-09-20  Draizy Delasancha is a 50 y.o. year old female who is a primary care patient of Orvis Brill, MD and is actively engaged with the care management team. I reached out to Johnanna Schneiders by phone today to assist with re-scheduling a follow up visit with the RN Case Manager  Follow up plan: Unsuccessful telephone outreach attempt made. The care management team will reach out to the patient again over the next 7 days. If patient returns call to provider office, please advise to call [place] at [place] Management  Direct Dial: [phone]

## 2021-06-19 NOTE — Chronic Care Management (AMB) (Signed)
  Care Management   Note  06/19/2021 Name: [name] MRN: [id number] DOB: [birth year]  [name] is a 50 y.o. year old female who is a primary care patient of Orvis Brill, MD and is actively engaged with the care management team. I reached out to Johnanna Schneiders by phone today to assist with re-scheduling a follow up visit with the RN Case Manager  Follow up plan: Telephone appointment with care management team member scheduled for:06/29/21  Gwenna Fuston  Care Guide, Embedded Care Coordination [place]  Care Management  Direct Dial: [phone]

## 2021-06-22 ENCOUNTER — Other Ambulatory Visit (HOSPITAL_COMMUNITY): Payer: Self-pay

## 2021-06-29 ENCOUNTER — Telehealth: Payer: Medicare Other

## 2021-06-29 ENCOUNTER — Telehealth: Payer: Self-pay

## 2021-06-29 NOTE — Telephone Encounter (Signed)
  Care Management   Outreach Note  06/29/2021 Name: [name] MRN: [id number] DOB: June 06, 1971  Referred by: Orvis Brill, MD Reason for referral : No chief complaint on file.   A second unsuccessful telephone outreach was attempted today. The patient was referred to the case management team for assistance with care management and care coordination.   Follow Up Plan: If patient returns call to provider office, please advise to call [place] at [phone].  Johnney Killian, RN, BSN, CCM Care Management Coordinator [facility] Internal Medicine Phone: [phone] / Fax: [phone]

## 2021-07-03 ENCOUNTER — Telehealth: Payer: Self-pay | Admitting: *Deleted

## 2021-07-03 NOTE — Chronic Care Management (AMB) (Signed)
  Care Management   Note  07/03/2021 Name: [name] MRN: [id number] DOB: October 31, 1971  [name] is a 50 y.o. year old female who is a primary care patient of Orvis Brill, MD and is actively engaged with the care management team. I reached out to Johnanna Schneiders by phone today to assist with re-scheduling a follow up visit with the RN Case Manager  Follow up plan: Unsuccessful telephone outreach attempt made. A HIPAA compliant phone message was left for the patient providing contact information and requesting a return call. The care management team will reach out to the patient again over the next 7 days. If patient returns call to provider office, please advise to call [place] at [phone].  [place] Management  Direct Dial: [phone]

## 2021-07-10 NOTE — Chronic Care Management (AMB) (Signed)
  Care Management   Note  07/10/2021 Name: Celenia Paramo MRN: [id number] DOB: [birth year]  Anaeli Kempen is a 50 y.o. year old female who is a primary care patient of Orvis Brill, MD and is actively engaged with the care management team. I reached out to Johnanna Schneiders by phone today to assist with re-scheduling a follow up visit with the RN Case Manager  Follow up plan: Telephone appointment with care management team member scheduled for:07/17/21  [place] Management  Direct Dial: [phone]

## 2021-07-16 ENCOUNTER — Other Ambulatory Visit (HOSPITAL_COMMUNITY): Payer: Self-pay

## 2021-07-17 ENCOUNTER — Ambulatory Visit: Payer: Medicare Other

## 2021-07-17 NOTE — Chronic Care Management (AMB) (Signed)
Care Management    RN Visit Note  07/17/2021 Name: [name] MRN: [id number] DOB: February 23, 1971  Subjective: [name] is a 50 y.o. year old female who is a primary care patient of Lorin Glass, Eliseo Squires, MD. The care management team was consulted for assistance with disease management and care coordination needs.    Engaged with patient by telephone for follow up visit in response to provider referral for case management and/or care coordination services.   Consent to Services:   [name] was given information about Care Management services today including:  Care Management services includes personalized support from designated clinical staff supervised by her physician, including individualized plan of care and coordination with other care providers 24/7 contact phone numbers for assistance for urgent and routine care needs. The patient may stop case management services at any time by phone call to the office staff.  Patient agreed to services and consent obtained.   Assessment: Review of patient past medical history, allergies, medications, health status, including review of consultants reports, laboratory and other test data, was performed as part of comprehensive evaluation and provision of chronic care management services.   SDOH (Social Determinants of Health) assessments and interventions performed:    Care Plan  Allergies  Allergen Reactions   Bactrim [Sulfamethoxazole-Trimethoprim] Anaphylaxis, Shortness Of Breath and Swelling   Lisinopril Cough   Losartan Cough   Bee Pollen Other (See Comments)    Seasonal allergies   Ivp Dye [Iodinated Diagnostic Agents] Nausea And Vomiting   Metrizamide Nausea And Vomiting   Pollen Extract     Seasonal allergies   Sulfa Antibiotics Swelling    Eyes and lips swelled up   Sulfasalazine Swelling    Eyes and lips swelled up   Other Rash   Peanut-Containing Drug Products Rash    Outpatient Encounter Medications as of 07/17/2021   Medication Sig Note   Accu-Chek FastClix Lancets MISC Check blood sugar one times a day    amLODipine (NORVASC) 5 MG tablet TAKE 1 TABLET (5 MG TOTAL) BY MOUTH DAILY.    atorvastatin (LIPITOR) 20 MG tablet Take 1 tablet (20 mg total) by mouth daily.    benztropine (COGENTIN) 0.5 MG tablet Take 0.5 mg by mouth 2 (two) times daily. MORNING (0900) AND NIGHT (2100)    carvedilol (COREG) 6.25 MG tablet TAKE ONE (1) TABLET BY MOUTH TWO (2) TIMES DAILY WITH A MEAL    cetirizine (ZYRTEC ALLERGY) 10 MG tablet Take 1 tablet (10 mg total) by mouth daily. 02/16/2021: Takes prn   DULoxetine (CYMBALTA) 30 MG capsule Take 30 mg by mouth daily.    glucose blood (ACCU-CHEK GUIDE) test strip Check blood sugar one times a day as instructed    Insulin Pen Needle 32G X 4 MM MISC USE TO INJECT INSULIN ONCE DAILY. DIAGNOSIS CODE E11.21    liraglutide (VICTOZA) 18 MG/3ML SOPN INJECT 1.8 MG INTO THE SKIN ONCE AS DIRECTED    metFORMIN (GLUCOPHAGE) 1000 MG tablet TAKE 1 TABLET BY MOUTH 2 TIMES DAILY    paliperidone (INVEGA SUSTENNA) 156 MG/ML SUSP injection Inject 156 mg into the muscle every 30 (thirty) days.     No facility-administered encounter medications on file as of 07/17/2021.    Patient Active Problem List   Diagnosis Date Noted   Need for pneumococcal vaccination 02/19/2021   Hypertension 03/21/2018   Allergic rhinitis 01/25/2018   OSA (obstructive sleep apnea) 06/10/2017   Congenital hearing disorder 12/08/2016   Schizoaffective disorder, chronic condition ([place]) 06/18/2016  Hyperlipidemia LDL goal <70 09/10/2014   Obesity (BMI 30-39.9) 07/30/2014   Pap smear for cervical cancer screening 07/30/2014   Type 2 diabetes mellitus ([place]) 11/07/2012    Conditions to be addressed/monitored: HTN and DMII  Care Plan : CCM RN- Diabetes Type 2 (Adult)  Updates made by Johnney Killian, RN since 07/17/2021 12:00 AM     Problem: Disease Progression (Diabetes, Type 2)      Long-Range Goal: Disease Progression  Prevented or Minimized   Start Date: 02/16/2021  Recent Progress: Not on track  Priority: High  Note:   Objective:   Lab Results  Component Value Date   HGBA1C 7.9 (A) 06/11/2021   HGBA1C 8.4 (A) 02/19/2021   HGBA1C 12.7 (A) 11/12/2020   Lab Results  Component Value Date   MICROALBUR 0.67 07/30/2014   LDLCALC 81 05/01/2020   CREATININE 0.59 10/06/2020    Current Barriers:  Knowledge Deficits related to basic Diabetes pathophysiology and self care/management- Successful outreach to patient to complete follow up CCM assessment.  Pt notes her glucometer is not working so she did not have an readings to discuss.  Patient thinks it is the battery and she is going to take it to [facility] to see if she can find a replacement. Congratulated patient on the drop in her A!C to 7.9 in July from 8.4 in April.  This RNCM also wished patient a happy birthday (tomorrow).  Patient noted she is due to come into clinic for a physical soon, she was thinking about the 10th or 11th.  Message sent to [place] staff to contact patient to set up appt. Unable to perform IADLs independently Case Manager Clinical Goal(s):  patient will demonstrate improved adherence to prescribed treatment plan for diabetes self care/management as evidenced by: adherence to ADA/ carb modified diet exercise 3-5 days/week adherence to prescribed medication regimen contacting provider for new or worsened symptoms or questions Interventions:  Collaboration with Orvis Brill, MD regarding development and update of comprehensive plan of care as evidenced by provider attestation and co-signature Inter-disciplinary care team collaboration (see longitudinal plan of care) Reviewed medications with patient , ensured she receives her medications via home delivery and assessed medication taking behavior Reviewed importance of continuing her daily walks, eliminating calories via drinks, increasing her consumption of fruits and vegetables   Positive reinforcement provided to patient for getting testing supplies and testing her blood sugars Reviewed fasting CBG target Assessed status of  scheduling her annual eye exam and discussed importance of keeping appointment to assess for DM eye disease Review of patient status, including review of consultants reports, relevant laboratory and other test results, and medications completed. Self-Care Activities - UNABLE to independently manage diabetes to meet treatment goals Patient Goals:  - change to whole grain breads, cereal, pasta - drink 6 to 8 glasses of water each day - limit fast food meals to no more than 1 per week - switch to sugar-free drinks - keep appointment with Butch Penny on 05/14/21 - keep appointment with eye doctor, write the appointment down in the [facility] calendar and keep appointment - check feet daily for cuts, sores or redness - keep feet up while sitting - wash and dry feet carefully every day - wear comfortable, well-fitting shoes Follow Up Plan: The care management team will reach out to the patient again over the next 30-60 days.       Plan: Telephone follow up appointment with care management team member scheduled for:  89 days  Johnney Killian, RN, BSN, CCM Care Management Coordinator [facility] Internal Medicine Phone: [phone] / Fax: [phone]

## 2021-07-17 NOTE — Patient Instructions (Signed)
Visit Information   Goals Addressed             This Visit's Progress    Blood Pressure < 140/90       HEMOGLOBIN A1C < 7.0       Obtain Eye Exam-Diabetes Type 2       Follow Up Date 08/15/21/22   - keep appointment with eye doctor - schedule appointment with eye doctor    Why is this important?   Eye check-ups are important when you have diabetes.  Vision loss can be prevented.   Notes:Opthalmologic appointment scheduled for September.         The patient verbalized understanding of instructions, educational materials, and care plan provided today and declined offer to receive copy of patient instructions, educational materials, and care plan.   Telephone follow up appointment with care management team member scheduled for: 08/21/21'@0930'$   Johnney Killian, RN, BSN, CCM Care Management Coordinator [facility] Internal Medicine Phone: [phone] / Fax: [phone]

## 2021-07-22 ENCOUNTER — Other Ambulatory Visit: Payer: Self-pay | Admitting: *Deleted

## 2021-07-22 DIAGNOSIS — E119 Type 2 diabetes mellitus without complications: Secondary | ICD-10-CM

## 2021-07-22 MED ORDER — VICTOZA 18 MG/3ML ~~LOC~~ SOPN: PEN_INJECTOR | SUBCUTANEOUS | 0 refills | Status: DC

## 2021-07-22 NOTE — Telephone Encounter (Signed)
Please arrange an office visit for diabetes follow up in the next 30 days. 30 day refill of victoza sent to her pharmacy

## 2021-08-13 ENCOUNTER — Other Ambulatory Visit: Payer: Self-pay | Admitting: Internal Medicine

## 2021-08-13 DIAGNOSIS — E119 Type 2 diabetes mellitus without complications: Secondary | ICD-10-CM

## 2021-08-18 ENCOUNTER — Other Ambulatory Visit: Payer: Self-pay

## 2021-08-18 DIAGNOSIS — E1121 Type 2 diabetes mellitus with diabetic nephropathy: Secondary | ICD-10-CM

## 2021-08-18 MED ORDER — INSULIN PEN NEEDLE 32G X 4 MM MISC: 1 refills | Status: DC

## 2021-08-21 ENCOUNTER — Telehealth: Payer: Self-pay

## 2021-08-21 ENCOUNTER — Telehealth: Payer: Medicare Other

## 2021-08-21 NOTE — Telephone Encounter (Signed)
     Care Management   Outreach Note  08/21/2021 Name: Mathew Storck MRN: [id number] DOB: 04-04-1971  Referred by: Orvis Brill, MD Reason for referral : Chronic Care Management (Type 2 diabetes mellitus without complication, without long-term current use of insulin )   An unsuccessful telephone outreach was attempted today. The patient was referred to the case management team for assistance with care management and care coordination.   Follow Up Plan: If patient returns call to provider office, please advise to call [place] at [phone].  Johnney Killian, RN, BSN, CCM Care Management Coordinator [facility] Internal Medicine Phone: [phone] / Fax: [phone]

## 2021-08-24 ENCOUNTER — Encounter: Payer: Self-pay | Admitting: Internal Medicine

## 2021-08-24 ENCOUNTER — Other Ambulatory Visit: Payer: Self-pay

## 2021-08-24 ENCOUNTER — Ambulatory Visit (INDEPENDENT_AMBULATORY_CARE_PROVIDER_SITE_OTHER): Payer: Medicare Other | Admitting: Internal Medicine

## 2021-08-24 VITALS — BP 129/85 | HR 99 | Temp 98.5°F | Resp 24 | Ht 64.0 in | Wt 217.0 lb

## 2021-08-24 DIAGNOSIS — Z1239 Encounter for other screening for malignant neoplasm of breast: Secondary | ICD-10-CM | POA: Diagnosis not present

## 2021-08-24 DIAGNOSIS — Z1231 Encounter for screening mammogram for malignant neoplasm of breast: Secondary | ICD-10-CM

## 2021-08-24 DIAGNOSIS — F259 Schizoaffective disorder, unspecified: Secondary | ICD-10-CM | POA: Diagnosis not present

## 2021-08-24 DIAGNOSIS — E119 Type 2 diabetes mellitus without complications: Secondary | ICD-10-CM

## 2021-08-24 DIAGNOSIS — Z23 Encounter for immunization: Secondary | ICD-10-CM | POA: Diagnosis not present

## 2021-08-24 DIAGNOSIS — I10 Essential (primary) hypertension: Secondary | ICD-10-CM

## 2021-08-24 DIAGNOSIS — E1121 Type 2 diabetes mellitus with diabetic nephropathy: Secondary | ICD-10-CM | POA: Diagnosis not present

## 2021-08-24 DIAGNOSIS — E785 Hyperlipidemia, unspecified: Secondary | ICD-10-CM

## 2021-08-24 LAB — POCT GLYCOSYLATED HEMOGLOBIN (HGB A1C): Hemoglobin A1C: 7.3 % — AB (ref 4.0–5.6)

## 2021-08-24 MED ORDER — ATORVASTATIN CALCIUM 20 MG PO TABS: 40.0000 mg | ORAL_TABLET | Freq: Every day | ORAL | 3 refills | Status: DC

## 2021-08-24 MED ORDER — VICTOZA 18 MG/3ML ~~LOC~~ SOPN: PEN_INJECTOR | SUBCUTANEOUS | 0 refills | Status: DC

## 2021-08-24 MED ORDER — ACCU-CHEK GUIDE VI STRP: ORAL_STRIP | 3 refills | Status: DC

## 2021-08-24 MED ORDER — ACCU-CHEK FASTCLIX LANCETS MISC: 3 refills | Status: DC

## 2021-08-24 MED ORDER — INSULIN PEN NEEDLE 32G X 4 MM MISC: 1 refills | Status: DC

## 2021-08-24 NOTE — Assessment & Plan Note (Signed)
Patient's blood pressure at goal today. Expresses no side effects such as dizziness, lightheadedness, or leg swelling - C/w carvedilol 6.25mg  BID - c/w amlodipine 5mg  daily

## 2021-08-24 NOTE — Progress Notes (Signed)
   CC: Blood sugar follow up  HPI:  [name] is a 50 y.o. PMH noted below, who presents to the [facility] tio follow up on her diabetes with no complaints. To see the management of his acute and chronic conditions, please refer to the A&P note under the encounters tab.   Past Medical History:  Diagnosis Date   Anemia    Congenital cerebral palsy (HCC)    Depression    Diabetes mellitus due to abnormal insulin ([place])    Edema 06/10/2017   Endometriosis    Hearing impairment    Heart murmur    at birth   Hypertension    Neuromuscular disorder ([place])    Cerebral palsy- very mild left side of brain   Schizoaffective disorder (HCC)    Thyroid nodule 07/30/2014   CT scan from 08/27/2010: A 1.9 cm calcified nodule involving the lower pole of the right lobe of the thyroid gland. US Neck 11/06/14: Bilateral nodules. Dominant right lower pole nodule measures 2.3 cm. Findings meet consensus criteria for biopsy. Ultrasound-guided fine needle aspiration should be considered Aspiration pathology: benign follicular nodule   Review of Systems:  negative as per HPI  Physical Exam: Gen: obese appearing woman in NAD, sitting comfortably in chair HEENT: normocephalic atraumatic, MMM, acanthosis nigrans present, neck supple CV: RRR, no m/r/g, no pitting edema Resp: Talking in complete sentences, normal WOB on room air, CTAB GI: Soft nontender, distended Neuro: Alert oriented answering questions appropriately MSK: Normal gait, moves extremities without difficulty Skin:warm and dry Psych: normal affect   Assessment & Plan:   See Encounters Tab for problem based charting.  Patient seen with Dr. Philipp Ovens

## 2021-08-24 NOTE — Assessment & Plan Note (Signed)
Patient is well controlled on cymbalta and paliperidone. Denies any problems with her mood and is feeling well today. - continue paliperidone every 30 days

## 2021-08-24 NOTE — Patient Instructions (Addendum)
[name]  It was a pleasure seeing you in the clinic today.   We talked about your diabetes, your high blood pressure, and your high cholesterol. We gave you a flu shot and will refer you to get a mammogram. We checked labs today, I will call you if anything comes back abnormal.  Diabetes You sugar levels continue to improve. Keep up the hard work! Continue to eat healthy, take the victoza, and your metformin.   2. High blood pressure Continue to take your amlodipine and the carvedilol  3. High cholesterol Continue taking your atorvastatin  4. Mammogram They should call you to schedule this appointment  Please call our clinic at [phone] if you have any questions or concerns. The best time to call is Monday-Friday from 9am-4pm, but there is someone available 24/7 at the same number. If you need medication refills, please notify your pharmacy one week in advance and they will send [place] a request.   Thank you for letting us take part in your care. We look forward to seeing you next time!

## 2021-08-24 NOTE — Assessment & Plan Note (Addendum)
Lipid panel checked today, patient at goal. - continue atorvastatin 40 mg daily

## 2021-08-24 NOTE — Assessment & Plan Note (Addendum)
Patient's diabetes continues to improve on the victoza and metformin. A1c down from 7.9 to 7.3 today. Patient does not currently have a battery for her blood sugar meter but says that she will [facility]. She was able to see the ophthalmologist but was unable to hear the results at the end of her appointment, advised patient to call back to discuss the results rather than to wait until her next yearly appointment. Provided encouragement and further dietary counseling - f/u BMP - C/w liraglutide 1.8mg  daily, metformin 1000mg  bid - f/u results of ophtho at next appointment - foot exam today - F/u in 3 months

## 2021-08-25 ENCOUNTER — Telehealth: Payer: Self-pay | Admitting: *Deleted

## 2021-08-25 LAB — GLUCOSE, CAPILLARY: Glucose-Capillary: 163 mg/dL — ABNORMAL HIGH (ref 70–99)

## 2021-08-25 NOTE — Chronic Care Management (AMB) (Signed)
  Care Management   Note  08/25/2021 Name: [name] MRN: [id number] DOB: 11-Apr-1971  [name] is a 50 y.o. year old female who is a primary care patient of Orvis Brill, MD and is actively engaged with the care management team. I reached out to Johnanna Schneiders by phone today to assist with re-scheduling a follow up visit with the RN Case Manager  Follow up plan: Unsuccessful telephone outreach attempt made. The care management team will reach out to the patient again over the next 7 days. If patient returns call to provider office, please advise to call [place] at [place] Management  Direct Dial: [phone]

## 2021-08-27 LAB — BMP8+ANION GAP
Anion Gap: 19 mmol/L — ABNORMAL HIGH (ref 10.0–18.0)
BUN/Creatinine Ratio: 12 (ref 9–23)
BUN: 7 mg/dL (ref 6–24)
CO2: 22 mmol/L (ref 20–29)
Calcium: 9.1 mg/dL (ref 8.7–10.2)
Chloride: 98 mmol/L (ref 96–106)
Creatinine, Ser: 0.58 mg/dL (ref 0.57–1.00)
Glucose: 168 mg/dL — ABNORMAL HIGH (ref 70–99)
Potassium: 3.3 mmol/L — ABNORMAL LOW (ref 3.5–5.2)
Sodium: 139 mmol/L (ref 134–144)
eGFR: 112 mL/min/{1.73_m2} (ref >59)

## 2021-08-27 LAB — LIPID PANEL
Chol/HDL Ratio: 3 ratio (ref 0.0–4.4)
Cholesterol, Total: 92 mg/dL — ABNORMAL LOW (ref 100–199)
HDL: 31 mg/dL — ABNORMAL LOW (ref >39)
LDL Chol Calc (NIH): 47 mg/dL (ref 0–99)
Triglycerides: 58 mg/dL (ref 0–149)
VLDL Cholesterol Cal: 14 mg/dL (ref 5–40)

## 2021-08-27 NOTE — Progress Notes (Signed)
Internal Medicine Clinic Attending  I saw and evaluated the patient.  I personally confirmed the key portions of the history and exam documented by Dr. DeMaio   and I reviewed pertinent patient test results.  The assessment, diagnosis, and plan were formulated together and I agree with the documentation in the resident's note.  

## 2021-09-01 NOTE — Chronic Care Management (AMB) (Signed)
  Care Management   Note  09/01/2021 Name: [name] MRN: [id number] DOB: 14-Feb-1971  [name] is a 50 y.o. year old female who is a primary care patient of Orvis Brill, MD and is actively engaged with the care management team. I reached out to Johnanna Schneiders by phone today to assist with re-scheduling a follow up visit with the RN Case Manager  Follow up plan: Unsuccessful telephone outreach attempt made. A HIPAA compliant phone message was left for the patient providing contact information and requesting a return call.  The care management team will reach out to the patient again over the next 7 days.  If patient returns call to provider office, please advise to call [place] at [phone].  [place] Management  Direct Dial: [phone]

## 2021-09-08 NOTE — Chronic Care Management (AMB) (Signed)
  Care Management   Note  09/08/2021 Name: [name] MRN: [id number] DOB: 11/10/1971  [name] is a 50 y.o. year old female who is a primary care patient of Orvis Brill, MD and is actively engaged with the care management team. I reached out to Johnanna Schneiders by phone today to assist with re-scheduling a follow up visit with the RN Case Manager  Follow up plan: A third unsuccessful telephone outreach attempt made. Unable to make contact on outreach attempts x 3. PCP Orvis Brill, MD notified via routed documentation in medical record. We have been unable to make contact with the patient for follow up. The care management team is available to follow up with the patient after provider conversation with the patient regarding recommendation for care management engagement and subsequent re-referral to the care management team.   [place] Management  Direct Dial: [phone]

## 2021-09-12 ENCOUNTER — Ambulatory Visit: Payer: Medicare Other

## 2021-10-13 ENCOUNTER — Other Ambulatory Visit: Payer: Self-pay | Admitting: Internal Medicine

## 2021-10-13 ENCOUNTER — Ambulatory Visit (INDEPENDENT_AMBULATORY_CARE_PROVIDER_SITE_OTHER): Payer: Medicare Other | Admitting: Internal Medicine

## 2021-10-13 ENCOUNTER — Other Ambulatory Visit: Payer: Self-pay

## 2021-10-13 DIAGNOSIS — J029 Acute pharyngitis, unspecified: Secondary | ICD-10-CM | POA: Insufficient documentation

## 2021-10-13 DIAGNOSIS — E119 Type 2 diabetes mellitus without complications: Secondary | ICD-10-CM

## 2021-10-13 NOTE — Telephone Encounter (Signed)
Next appt scheduled 10/16/21 with Dr Marlou Sa.

## 2021-10-13 NOTE — Progress Notes (Signed)
   CC: sore throat and malaise  This is a telephone encounter between [name] and Orvis Brill MD on 10/13/2021 for sore throat and malaise. The visit was conducted with the patient located at home and Orvis Brill MD at [facility]. The patient's identity was confirmed using their DOB and current address. The patient has consented to being evaluated through a telephone encounter and understands the associated risks (an examination cannot be done and the patient may need to come in for an appointment) / benefits (allows the patient to remain at home, decreasing exposure to coronavirus). I personally spent 10 minutes on medical discussion.   HPI:  [name] is a 50 y.o. with PMH as below.   Please see A&P for assessment of the patient's acute and chronic medical conditions.   Past Medical History:  Diagnosis Date   Anemia    Congenital cerebral palsy (HCC)    Depression    Diabetes mellitus due to abnormal insulin ([place])    Edema 06/10/2017   Endometriosis    Hearing impairment    Heart murmur    at birth   Hypertension    Neuromuscular disorder ([place])    Cerebral palsy- very mild left side of brain   Schizoaffective disorder (HCC)    Thyroid nodule 07/30/2014   CT scan from 08/27/2010: A 1.9 cm calcified nodule involving the lower pole of the right lobe of the thyroid gland. US Neck 11/06/14: Bilateral nodules. Dominant right lower pole nodule measures 2.3 cm. Findings meet consensus criteria for biopsy. Ultrasound-guided fine needle aspiration should be considered Aspiration pathology: benign follicular nodule   Review of Systems:  Negative aside from that listed in individualized problem based charting.   Assessment & Plan:   See Encounters Tab for problem based charting.  Patient discussed with Dr.  Rutherford Guys, MD Internal Medicine Resident, PGY-1 Internal Medicine Clinic

## 2021-10-13 NOTE — Assessment & Plan Note (Signed)
The patient was seen for virtual visit for sore throat, dry cough, and diarrhea x2 weeks. She states that she was out of town in [place] prior to her symptoms starting. She was around a lot of people but does not recall anyone feeling sick. She c/o a sore throat that has worsened since 2 weeks ago though she does feel somewhat better overall. Also endorses watery diarrhea and states that there was "a little bit of blood in the diarrhea." Also c/o chest pain on the right side while coughing. Tested herself for COVID and was found to be negative. Denies fevers, n/v, abdominal pain. She has tried robitussin and cough drops which have not helped. Tylenol helps with the throat pain, but the pain comes back when the medication wears off.  P: Advised the patient to come in to the clinic this week to be seen for physical exam and labs. Differential includes viral infection (+diarrhea, some overall improvement since onset) vs strep throat (worsening sore throat). Pt is agreeable to this plan. Recommend conservative tx in the meantime, and pt advised to go to the ED for any worsening SOB, etc.

## 2021-10-14 ENCOUNTER — Ambulatory Visit: Payer: Medicare Other

## 2021-10-16 ENCOUNTER — Ambulatory Visit (INDEPENDENT_AMBULATORY_CARE_PROVIDER_SITE_OTHER): Payer: Medicare Other | Admitting: Internal Medicine

## 2021-10-16 ENCOUNTER — Encounter: Payer: Self-pay | Admitting: Internal Medicine

## 2021-10-16 ENCOUNTER — Other Ambulatory Visit: Payer: Self-pay

## 2021-10-16 VITALS — BP 119/73 | HR 97 | Temp 98.3°F | Resp 20 | Ht 64.0 in | Wt 216.9 lb

## 2021-10-16 DIAGNOSIS — E1121 Type 2 diabetes mellitus with diabetic nephropathy: Secondary | ICD-10-CM | POA: Diagnosis not present

## 2021-10-16 DIAGNOSIS — J310 Chronic rhinitis: Secondary | ICD-10-CM | POA: Insufficient documentation

## 2021-10-16 DIAGNOSIS — E119 Type 2 diabetes mellitus without complications: Secondary | ICD-10-CM

## 2021-10-16 MED ORDER — INSULIN PEN NEEDLE 32G X 4 MM MISC: 1 refills | Status: DC

## 2021-10-16 MED ORDER — OXYMETAZOLINE HCL 0.05 % NA SOLN: 1.0000 | Freq: Two times a day (BID) | NASAL | 0 refills | Status: DC

## 2021-10-16 MED ORDER — FLUTICASONE PROPIONATE 50 MCG/ACT NA SUSP: 1.0000 | Freq: Every day | NASAL | 0 refills | Status: DC

## 2021-10-16 MED ORDER — VICTOZA 18 MG/3ML ~~LOC~~ SOPN: PEN_INJECTOR | SUBCUTANEOUS | 0 refills | Status: DC

## 2021-10-16 NOTE — Assessment & Plan Note (Signed)
Assessment: Patient reports two weeks of diarrhea, cough, sore throat, sneezing, ear pain, and runny nose that have been gradually improving. She denies diarrheal illness over the last several days. She reports no fever but states that she "runs hot and cold," body aches, and denies nausea and vomiting. She has had a poor appetite. She has had her flu shot.She denies sick contacts. At this time her most concerning symptom is persistent sore throat due to sinus drainage. Plan: Given clinical improvement with time of symptoms, I am less inclined to feel that this is a bacterial infectious process--both of ears or sinuses. Crestor criteria score is 0 making strep throat highly unlikely. It seems that her symptoms are related to a previous and resolving viral infection. Will recommend that she use Neti pot, Flonase daily, afrin daily for three days, clorasept throat spray for pain control, and a teaspoon of honey and/or drinking warm beverages for further symptom management.

## 2021-10-16 NOTE — Patient Instructions (Addendum)
Thank you for visiting the Internal Medicine Clinic today. It was a pleasure to meet you! Today we discussed how you have been feeling unwell over the last several weeks including a sore throat, some congestion, and ear pain. I am glad to hear that you are feeling better overall! To help with the lingering symptoms that you have, I recommend that you obtain the following to manage your symptoms at home:  Neti Pot: For nasal irrigation Flonase Nasal Spray Afrin Nasal spray--for three days, then stop. Cloraseptic spray to help with your throat pain A teaspoon of honey, swallowed Warm beverages  Follow-up: In 1 week if you don't notice improvement in your symptoms or that they are getting worse.  Remember: If you have any questions or concerns, please call our clinic at [phone] between 9am-5pm and after hours call [phone] and ask for the internal medicine resident on call. If you feel you are having a medical emergency please call 911.  Farrel Gordon, DO

## 2021-10-16 NOTE — Progress Notes (Signed)
   CC: sore throat  HPI:  [name] is a 50 y.o. F with PMH as detailed below who presents with chief complaint of sore throat in addition to sinus drainage, cough, and recently resolved diarrhea. Please see problem based charting for detailed assessment and plan.  Past Medical History:  Diagnosis Date   Anemia    Congenital cerebral palsy (HCC)    Depression    Diabetes mellitus due to abnormal insulin ([place])    Edema 06/10/2017   Endometriosis    Hearing impairment    Heart murmur    at birth   Hypertension    Neuromuscular disorder ([place])    Cerebral palsy- very mild left side of brain   Schizoaffective disorder (HCC)    Thyroid nodule 07/30/2014   CT scan from 08/27/2010: A 1.9 cm calcified nodule involving the lower pole of the right lobe of the thyroid gland. US Neck 11/06/14: Bilateral nodules. Dominant right lower pole nodule measures 2.3 cm. Findings meet consensus criteria for biopsy. Ultrasound-guided fine needle aspiration should be considered Aspiration pathology: benign follicular nodule   Review of Systems:  Review of Systems  Constitutional:  Positive for chills and diaphoresis. Negative for fever.  HENT:  Positive for congestion, ear pain and sore throat. Negative for sinus pain.   Respiratory:  Positive for cough. Negative for shortness of breath.   Cardiovascular:  Negative for chest pain.  Gastrointestinal:  Positive for diarrhea.  Musculoskeletal:  Positive for myalgias.  Neurological:  Negative for headaches.    Physical Exam:  Vitals:   10/16/21 0937  BP: 119/73  Pulse: 97  Resp: 20  Temp: 98.3 F (36.8 C)  TempSrc: Oral  SpO2: 99%  Weight: 216 lb 14.4 oz (98.4 kg)  Height: 5\' 4"  (1.626 m)   Constitutional:Pleasant, in no acute distress. HENT: Bilateral TM without erythema, no fluid appreciated behind TM. Bilateral nasal turbinates are erythematous and edematous. Throat with cobblestoning but no exudate. Eyes: No discharge or  erythema. Neck: Non-tender, no cervical lymphadenopathy. Cardio: Regular rate and rhythm. No murmurs, rubs, gallops. Pulm: Clear to auscultation bilaterally. Abdomen: Soft, non-tender. MSK: No extremity edema noted. Skin: Warm and dry. Neuro: Alert and oriented x3. No focal deficit noted. Psych: Normal mood and affect.  Assessment & Plan:   See Encounters Tab for problem based charting.  Patient seen with Dr.  Saverio Danker

## 2021-10-19 NOTE — Progress Notes (Signed)
Internal Medicine Clinic Attending  I saw and evaluated the patient.  I personally confirmed the key portions of the history and exam documented by Dr. Marlou Sa and I reviewed pertinent patient test results.  The assessment, diagnosis, and plan were formulated together and I agree with the documentation in the resident's note. Centor criteria 0.

## 2021-10-20 ENCOUNTER — Telehealth: Payer: Self-pay

## 2021-10-20 NOTE — Telephone Encounter (Signed)
Insulin Pen Needle 32G X 4 MM MISC  paliperidone (INVEGA SUSTENNA) 156 MG/ML SUSP injection, refill request @

## 2021-10-20 NOTE — Telephone Encounter (Signed)
Called pt -stated she needs refill on pen needles and insulin (not Invega). Pen needles and Victoza were refilled 10/16/21.  I called Amelia who stated the ACT team picks up pt's medications. Stated they did received new rxs on 12/2. And she checked and stated the ACT Team picked up Victoza on 10/15/21.  Called pt back and informed of the above and to call the ACT Team - she stated "ok".

## 2021-10-27 NOTE — Progress Notes (Signed)
Internal Medicine Clinic Attending ° °Case discussed with Dr. Bonanno  °  At the time of the visit.  We reviewed the resident’s history and exam and pertinent patient test results.  I agree with the assessment, diagnosis, and plan of care documented in the resident’s note. ° °

## 2021-11-25 ENCOUNTER — Other Ambulatory Visit: Payer: Self-pay

## 2021-11-25 DIAGNOSIS — I10 Essential (primary) hypertension: Secondary | ICD-10-CM

## 2021-12-01 MED ORDER — CARVEDILOL 6.25 MG PO TABS: ORAL_TABLET | ORAL | 3 refills | Status: DC

## 2021-12-17 ENCOUNTER — Encounter: Payer: Self-pay | Admitting: Internal Medicine

## 2021-12-17 ENCOUNTER — Ambulatory Visit (INDEPENDENT_AMBULATORY_CARE_PROVIDER_SITE_OTHER): Payer: Medicare Other | Admitting: Internal Medicine

## 2021-12-17 ENCOUNTER — Other Ambulatory Visit: Payer: Self-pay

## 2021-12-17 VITALS — BP 121/78 | HR 96 | Temp 98.2°F | Ht 64.0 in | Wt 216.2 lb

## 2021-12-17 DIAGNOSIS — E1121 Type 2 diabetes mellitus with diabetic nephropathy: Secondary | ICD-10-CM | POA: Diagnosis not present

## 2021-12-17 DIAGNOSIS — H905 Unspecified sensorineural hearing loss: Secondary | ICD-10-CM

## 2021-12-17 DIAGNOSIS — E119 Type 2 diabetes mellitus without complications: Secondary | ICD-10-CM

## 2021-12-17 LAB — POCT GLYCOSYLATED HEMOGLOBIN (HGB A1C): Hemoglobin A1C: 6.7 % — AB (ref 4.0–5.6)

## 2021-12-17 LAB — GLUCOSE, CAPILLARY: Glucose-Capillary: 98 mg/dL (ref 70–99)

## 2021-12-17 MED ORDER — METFORMIN HCL 1000 MG PO TABS: ORAL_TABLET | Freq: Two times a day (BID) | ORAL | 3 refills | Status: DC

## 2021-12-17 MED ORDER — VICTOZA 18 MG/3ML ~~LOC~~ SOPN: PEN_INJECTOR | SUBCUTANEOUS | 5 refills | Status: DC

## 2021-12-17 NOTE — Assessment & Plan Note (Addendum)
The patient is requesting refill on her Victoza today.  She has been taking this as prescribed.  She has not been able to check her sugars at home recently due to battery and her meter being dead.  She states she is able to get a new battery in the next few days.  The patient states that she is concerned about her weight, stating that she has weighed around 200 pounds and feels she is not making much progress on her weight.  She tries to exercise every day.  However, she continues to drink soda and juice every day.  Fasting glucose today 98.  A1c 6.7, down from 7.3 three months ago.  Plan: Refilled Victoza and metformin today.  Advised to cut back on sugary drinks.  We will follow-up in 3 months for repeat A1c.

## 2021-12-17 NOTE — Progress Notes (Signed)
° °  CC: medication refill, left hearing aid issue  HPI:  [name] is a 51 y.o. with past medical history as noted below who presents to the clinic today for medication refill/concern with left hearing aid. Please see problem-based list for further details, assessments, and plans.   Past Medical History:  Diagnosis Date   Anemia    Congenital cerebral palsy (HCC)    Depression    Diabetes mellitus due to abnormal insulin ([place])    Edema 06/10/2017   Endometriosis    Hearing impairment    Heart murmur    at birth   Hypertension    Neuromuscular disorder ([place])    Cerebral palsy- very mild left side of brain   Schizoaffective disorder (HCC)    Thyroid nodule 07/30/2014   CT scan from 08/27/2010: A 1.9 cm calcified nodule involving the lower pole of the right lobe of the thyroid gland. US Neck 11/06/14: Bilateral nodules. Dominant right lower pole nodule measures 2.3 cm. Findings meet consensus criteria for biopsy. Ultrasound-guided fine needle aspiration should be considered Aspiration pathology: benign follicular nodule   Review of Systems:  Negative aside from that listed in individualized problem based charting.  Physical Exam:  Vitals:   12/17/21 0927  BP: 121/78  Pulse: 96  Temp: 98.2 F (36.8 C)  TempSrc: Oral  SpO2: 100%  Weight: 216 lb 3.2 oz (98.1 kg)  Height: 5\' 4"  (1.626 m)   General: NAD, nl appearance HE: Normocephalic, atraumatic, EOMI, Conjunctivae normal ENT: No congestion, no rhinorrhea, no exudate or erythema. Tympanic membranes are normal-appearing on both sides Cardiovascular: Normal rate, regular rhythm. No murmurs, rubs, or gallops Pulmonary: Effort normal, breath sounds normal. No wheezes, rales, or rhonchi Abdominal: soft, nontender, bowel sounds present Musculoskeletal: no swelling, deformity, injury or tenderness in extremities Skin: Warm, dry, no bruising, erythema, or rash Psychiatric/Behavioral: normal mood, normal behavior      Assessment & Plan:   See Encounters Tab for problem based charting.  Patient discussed with Dr. Evette Doffing

## 2021-12-17 NOTE — Patient Instructions (Addendum)
Thank you, Ms.Johnanna Schneiders for allowing [place] to provide your care today. Today we discussed your diabetes and your hearing aid.    For your diabetes: Your sugars looked okay for now, but please obtain your battery when you can so that you can use your meter to check her sugars at home.  Your A1c is pending, but I will contact with this result when it comes back.  For your hearing aids: I will place a new referral to ENT so that they can fix or get you a new hearing aid.  Your ears did not look infected on my exam today.  I have ordered the following labs for you:   Lab Orders         Glucose, capillary         POC Hbg A1C         POCT CBG (Fasting - Glucose)       Referrals ordered today:   Referral Orders  No referral(s) requested today     I have ordered the following medication/changed the following medications:   Stop the following medications: Medications Discontinued During This Encounter  Medication Reason   metFORMIN (GLUCOPHAGE) 1000 MG tablet Reorder     Start the following medications: Meds ordered this encounter  Medications   metFORMIN (GLUCOPHAGE) 1000 MG tablet    Sig: TAKE 1 TABLET BY MOUTH 2 TIMES DAILY    Dispense:  180 tablet    Refill:  3     Follow up: 3 months   Should you have any questions or concerns please call the internal medicine clinic at [phone].

## 2021-12-17 NOTE — Assessment & Plan Note (Signed)
The patient is here today due to concerns with her left hearing aid.  She states that she has had hearing loss in both ears since she was a child.  Her left hearing aid is turned up all the way and she is asking for referral to get the hearing aid fixed/adjusted.  No abnormalities on HEENT exam.  The patient has been seen by ENT in the past but does not have their contact info.  Plan: New referral placed to ENT for evaluation of hearing on the left side.

## 2021-12-18 NOTE — Progress Notes (Signed)
Internal Medicine Clinic Attending ° °Case discussed with Dr. Bonanno  °  At the time of the visit.  We reviewed the resident’s history and exam and pertinent patient test results.  I agree with the assessment, diagnosis, and plan of care documented in the resident’s note. ° °

## 2022-02-08 ENCOUNTER — Ambulatory Visit (INDEPENDENT_AMBULATORY_CARE_PROVIDER_SITE_OTHER): Payer: Medicare Other | Admitting: Internal Medicine

## 2022-02-08 ENCOUNTER — Encounter: Payer: Self-pay | Admitting: Internal Medicine

## 2022-02-08 DIAGNOSIS — J029 Acute pharyngitis, unspecified: Secondary | ICD-10-CM

## 2022-02-08 MED ORDER — BENZONATATE 100 MG PO CAPS: 100.0000 mg | ORAL_CAPSULE | Freq: Three times a day (TID) | ORAL | 1 refills | Status: DC | PRN

## 2022-02-08 NOTE — Assessment & Plan Note (Signed)
Patient evaluated via telehealth visit due to not feeling well. She reports cough with clear sputum, sore throat which patient thinks is related to cough, generlized myalgias, headache for the past week.  Has been checking temperature with maximum 100.1 2 days ago. She has tried robitussin and alleve without relief. She also endorses some DOE, but states she is still able to ambulate thorugh her house, able to get food and make it to the bathroom without difficulty. Had some lightheadedness prevoously, now resolved. Had some nausea which has now resolved and she is tolerating PO intake again. States she feels a little bit better, but requesting something to help with symptoms. She reports she is UTD with flu shot, but is not up to date with covid shots. She has not taken any home tests. States her son had similar symptoms earlier last week which have now resolved, but he did not get evaluated.  ? ?Speaking full sentences. Coughing.  ? ?- tessalon and robitussin, advil, tylenol scheduled, fluids. Advised to call clinic if no improvement, sooner if worsening symptoms. ?

## 2022-02-08 NOTE — Progress Notes (Signed)
?  [facility] Telephone Encounter ?Continuity Care Appointment ? ?HPI:  ?This telephone encounter was created for [name] on 02/08/2022 for the following purpose/cc not feeling well.  ? ?Past Medical History:  ?Past Medical History:  ?Diagnosis Date  ? Anemia   ? Congenital cerebral palsy ([place])   ? Depression   ? Diabetes mellitus due to abnormal insulin ([place])   ? Edema 06/10/2017  ? Endometriosis   ? Hearing impairment   ? Heart murmur   ? at birth  ? Hypertension   ? Neuromuscular disorder ([place])   ? Cerebral palsy- very mild left side of brain  ? Schizoaffective disorder ([place])   ? Thyroid nodule 07/30/2014  ? CT scan from 08/27/2010: A 1.9 cm calcified nodule involving the lower pole of the right lobe of the thyroid gland. US Neck 11/06/14: Bilateral nodules. Dominant right lower pole nodule measures 2.3 cm. Findings meet consensus criteria for biopsy. Ultrasound-guided fine needle aspiration should be considered Aspiration pathology: benign follicular nodule  ?  ? ?ROS:  ?See hpi  ? ?Assessment / Plan / Recommendations:  ?Please see A&P under problem oriented charting for assessment of the patient's acute and chronic medical conditions.  ?As always, pt is advised that if symptoms worsen or new symptoms arise, they should go to an urgent care facility or to to ER for further evaluation.  ? ?Consent and Medical Decision Making:  ?Patient discussed with Dr. Dareen Piano ?This is a telephone encounter between [employer] and [employer] on 02/08/2022 for feeling sick. The visit was conducted with the patient located at home and Delene Ruffini at [facility]. The patient's identity was confirmed using their DOB and current address. The patient has consented to being evaluated through a telephone encounter and understands the associated risks (an examination cannot be done and the patient may need to come in for an appointment) / benefits (allows the patient to remain at home,  decreasing exposure to coronavirus). I personally spent 15 minutes on medical discussion.   ? ? ?

## 2022-02-10 NOTE — Progress Notes (Signed)
Internal Medicine Clinic Attending ° °Case discussed with Dr. Gawaluck  At the time of the visit.  We reviewed the resident’s history and exam and pertinent patient test results.  I agree with the assessment, diagnosis, and plan of care documented in the resident’s note.  °

## 2022-04-02 DIAGNOSIS — H903 Sensorineural hearing loss, bilateral: Secondary | ICD-10-CM | POA: Insufficient documentation

## 2022-04-06 ENCOUNTER — Other Ambulatory Visit: Payer: Self-pay

## 2022-04-06 DIAGNOSIS — I10 Essential (primary) hypertension: Secondary | ICD-10-CM

## 2022-04-07 MED ORDER — AMLODIPINE BESYLATE 5 MG PO TABS: ORAL_TABLET | Freq: Every day | ORAL | 3 refills | Status: DC

## 2022-04-16 ENCOUNTER — Ambulatory Visit (INDEPENDENT_AMBULATORY_CARE_PROVIDER_SITE_OTHER): Payer: Medicare Other

## 2022-04-16 ENCOUNTER — Other Ambulatory Visit: Payer: Self-pay

## 2022-04-16 DIAGNOSIS — E119 Type 2 diabetes mellitus without complications: Secondary | ICD-10-CM

## 2022-04-16 DIAGNOSIS — Z0000 Encounter for general adult medical examination without abnormal findings: Secondary | ICD-10-CM | POA: Diagnosis not present

## 2022-04-16 MED ORDER — ACCU-CHEK GUIDE VI STRP: ORAL_STRIP | 3 refills | Status: DC

## 2022-04-16 NOTE — Progress Notes (Cosign Needed)
Subjective:   [name] is a 51 y.o. female who presents for an Initial Medicare Annual Wellness Visit. I connected with  Johnanna Schneiders on 04/16/22 by a audio enabled telemedicine application and verified that I am speaking with the correct person using two identifiers.  Patient Location: Home  Provider Location: Office/Clinic  I discussed the limitations of evaluation and management by telemedicine. The patient expressed understanding and agreed to proceed.  Review of Systems    Defer to PCP       Objective:    There were no vitals filed for this visit. There is no height or weight on file to calculate BMI.     04/16/2022    1:29 PM 12/17/2021    9:36 AM 10/16/2021    9:44 AM 08/24/2021   10:37 AM 02/19/2021    8:44 AM 11/12/2020   10:03 AM 10/06/2020    5:19 PM  Advanced Directives  Does Patient Have a Medical Advance Directive? No No No No No No No  Would patient like information on creating a medical advance directive? No - Patient declined No - Patient declined No - Patient declined No - Patient declined No - Patient declined No - Patient declined No - Patient declined    Current Medications (verified) Outpatient Encounter Medications as of 04/16/2022  Medication Sig   Accu-Chek FastClix Lancets MISC Check blood sugar one times a day   amLODipine (NORVASC) 5 MG tablet TAKE 1 TABLET (5 MG TOTAL) BY MOUTH DAILY.   atorvastatin (LIPITOR) 20 MG tablet Take 2 tablets (40 mg total) by mouth daily.   benzonatate (TESSALON PERLES) 100 MG capsule Take 1 capsule (100 mg total) by mouth 3 (three) times daily as needed for cough.   benztropine (COGENTIN) 0.5 MG tablet Take 0.5 mg by mouth 2 (two) times daily. MORNING (0900) AND NIGHT (2100)   carvedilol (COREG) 6.25 MG tablet TAKE ONE (1) TABLET BY MOUTH TWO (2) TIMES DAILY WITH A MEAL   cetirizine (ZYRTEC ALLERGY) 10 MG tablet Take 1 tablet (10 mg total) by mouth daily.   DULoxetine (CYMBALTA) 30 MG capsule Take 30 mg by mouth  daily.   fluticasone (FLONASE) 50 MCG/ACT nasal spray Place 1 spray into both nostrils daily.   glucose blood (ACCU-CHEK GUIDE) test strip Check blood sugar one times a day as instructed   Insulin Pen Needle 32G X 4 MM MISC USE TO INJECT INSULIN ONCE DAILY. DIAGNOSIS CODE E11.21   liraglutide (VICTOZA) 18 MG/3ML SOPN INJECT 1.'8MG'$  INTO THE SKIN ONCE AS DIRECTED   metFORMIN (GLUCOPHAGE) 1000 MG tablet TAKE 1 TABLET BY MOUTH 2 TIMES DAILY   oxymetazoline (AFRIN NASAL SPRAY) 0.05 % nasal spray Place 1 spray into both nostrils 2 (two) times daily.   paliperidone (INVEGA SUSTENNA) 156 MG/ML SUSP injection Inject 156 mg into the muscle every 30 (thirty) days.    No facility-administered encounter medications on file as of 04/16/2022.    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 US Neck 11/06/14: Bilateral nodules. Dominant right lower pole nodule measures 2.3 cm. Findings meet consensus criteria for biopsy. Ultrasound-guided fine needle aspiration should be considered Aspiration pathology: benign follicular nodule   Past Surgical History:  Procedure Laterality Date   CESAREAN SECTION     EXPLORATORY LAPAROTOMY WITH ABDOMINAL MASS EXCISION     MYOMECTOMY N/A 08/05/2015   Procedure:  Abdominal Hysterectomy, Bilateral Salpingectomy;  Surgeon: Osborne Oman, MD;  Location: [facility];  Service: Gynecology;  Laterality: N/A;   Family History  Problem Relation Age of Onset   Heart failure Mother    Hypertension Mother    Diabetes Mellitus II Mother    Cancer Mother    Diabetes Mellitus II Father    Hypertension Father    Social History   Socioeconomic History   Marital status: Married    Spouse name: Not on file   Number of children: Not on file   Years of education: Not on file   Highest education level: Not on file  Occupational History   Not on file  Tobacco Use   Smoking status: Never   Smokeless tobacco: Never  Substance and Sexual Activity   Alcohol use: No   Drug use: No   Sexual activity: Not Currently    Birth control/protection: Surgical  Other Topics Concern   Not on file  Social History Narrative   Not on file   Social Determinants of Health   Financial Resource Strain: Low Risk    Difficulty of Paying Living Expenses: Not hard at all  Food Insecurity: Food Insecurity Present   Worried About Charity fundraiser in the Last Year: Often true   Arboriculturist in the Last Year: Often true  Transportation Needs: No Transportation Needs   Lack of Transportation (Medical): No   Lack of Transportation (Non-Medical): No  Physical Activity: Inactive   Days of Exercise per Week: 0 days   Minutes of Exercise per Session: 0 min  Stress: No Stress Concern Present   Feeling of Stress : Only a little  Social Connections: Moderately Isolated   Frequency of Communication with Friends and Family: More than three times a week   Frequency of Social Gatherings with Friends and Family: Once a week   Attends Religious Services: Never   Marine scientist or Organizations: No   Attends Music therapist: Never   Marital Status: Married    Tobacco Counseling Counseling given: Not Answered   Clinical Intake:  Pre-visit preparation  completed: Yes  Pain : No/denies pain     Nutritional Risks: None Diabetes: Yes  How often do you need to have someone help you when you read instructions, pamphlets, or other written materials from your doctor or pharmacy?: 1 - Never What is the last grade level you completed in school?: 12th grade  Diabetic?Yes  Interpreter Needed?: No  Information entered by :: Nicoli Nardozzi,cma 04/16/22 1:22pm   Activities of Daily Living    04/16/2022    1:29 PM 12/17/2021    9:35 AM  In your present state of health, do you have any difficulty performing the following activities:  Hearing? 0 1  Vision? 0 0  Difficulty concentrating or making decisions? 1 0  Comment at times  Walking or climbing stairs? 0 0  Dressing or bathing? 0 0  Doing errands, shopping? 0 0    Patient Care Team: Orvis Brill, MD as PCP - General Shirley Muscat, Loreen Freud, MD as Referring Physician (Optometry) Jesus Genera, RN as Registered Nurse  Indicate any recent Medical Services you may have received from other than [facility] providers in the past year (date may be approximate).     Assessment:   This is a routine wellness examination for [name].  Hearing/Vision screen No results found.  Dietary issues and exercise activities discussed:     Goals Addressed   None   Depression Screen    04/16/2022    1:24 PM 12/17/2021    9:35 AM 10/16/2021    9:45 AM 02/19/2021    8:46 AM 05/30/2020   10:05 AM 05/01/2020    9:45 AM 02/05/2020    3:08 PM  PHQ 2/9 Scores  PHQ - 2 Score 2 0 2 0 1 4 0  PHQ- 9 Score '7  5 4 2 4 2    '$ Fall Risk    04/16/2022    1:29 PM 12/17/2021    9:35 AM 10/16/2021    9:44 AM 08/24/2021   10:36 AM 02/19/2021    8:45 AM  Fall Risk   Falls in the past year? 0 0 0 0 0  Number falls in past yr: 0 0 0 0   Injury with Fall? 0 0 0 0   Risk for fall due to : No Fall Risks No Fall Risks No Fall Risks No Fall Risks   Follow up Falls evaluation completed;Falls prevention discussed Falls  evaluation completed;Falls prevention discussed Falls evaluation completed;Falls prevention discussed Falls evaluation completed;Falls prevention discussed     FALL RISK PREVENTION PERTAINING TO THE HOME:  Any stairs in or around the home? Yes  If so, are there any without handrails? No  Home free of loose throw rugs in walkways, pet beds, electrical cords, etc? Yes  Adequate lighting in your home to reduce risk of falls? Yes   ASSISTIVE DEVICES UTILIZED TO PREVENT FALLS:  Life alert? No  Use of a cane, walker or w/c? No  Grab bars in the bathroom? No  Shower chair or bench in shower? No  Elevated toilet seat or a handicapped toilet? No   TIMED UP AND GO:  Was the test performed? No .  Length of time to ambulate 10 feet: N/A sec.   Gait steady and fast without use of assistive device  Cognitive Function:        04/16/2022    1:29 PM  6CIT Screen  What Year? 0 points  What month? 3 points  What time? 0 points  Count back from 20 0 points  Months in reverse 0 points  Repeat phrase 0 points  Total Score 3 points    Immunizations Immunization History  Administered Date(s) Administered   Influenza,inj,Quad PF,6+ Mos 07/30/2014, 08/07/2015, 08/02/2016, 09/26/2018, 11/12/2020, 08/24/2021   PFIZER(Purple Top)SARS-COV-2 Vaccination 06/02/2020, 07/26/2020   Pneumococcal Polysaccharide-23 02/19/2021   Tdap 09/10/2014    TDAP status: Up to date  Flu Vaccine status: Up to date   Covid-19 vaccine status: Completed vaccines  Qualifies for Shingles Vaccine? No   Zostavax completed No   Shingrix Completed?: No.    Education has been provided regarding the importance of this vaccine. Patient has been advised to call insurance company to determine out of pocket expense if they have not yet received this vaccine.  Advised may also receive vaccine at local pharmacy or Health Dept. Verbalized acceptance and understanding.  Screening Tests Health Maintenance  Topic Date Due    Hepatitis C Screening  Never done   OPHTHALMOLOGY EXAM  12/27/2017   COLONOSCOPY (Pts 45-50yr Insurance coverage will need to be confirmed)  Never done   COVID-19 Vaccine (3 - Booster for Pfizer series) 09/20/2020   FOOT EXAM  05/30/2021   HEMOGLOBIN A1C  03/16/2022   URINE MICROALBUMIN  06/11/2022   INFLUENZA VACCINE  06/15/2022   LIPID PANEL  08/24/2022   TETANUS/TDAP  09/10/2024   PAP SMEAR-Modifier  11/12/2025   HIV Screening  Completed   HPV VACCINES  Aged Out    Health Maintenance  Health Maintenance Due  Topic Date Due   Hepatitis C Screening  Never done   OPHTHALMOLOGY EXAM  12/27/2017   COLONOSCOPY (Pts 45-420yrInsurance coverage will need to be confirmed)  Never done   COVID-19 Vaccine (3 - Booster for Pfizer series) 09/20/2020   FOOT EXAM  05/30/2021   HEMOGLOBIN A1C  03/16/2022   URINE MICROALBUMIN  06/11/2022    Lung Cancer Screening: (Low Dose CT Chest recommended if Age 624-80ears, 30 pack-year currently smoking OR have quit w/in 15years.) does not qualify.   Lung Cancer Screening Referral: N/A  Additional Screening:  Hepatitis C Screening: does not qualify; Completed N/A  Vision Screening: Recommended annual ophthalmology exams for early detection of glaucoma and other disorders of the eye. Is the patient up to date with their annual eye exam?  No  Who is the provider or what is the name of the office in which the patient attends annual eye exams? Dr.Ber If pt is not established with a provider, would they like to be referred to a provider to establish care? No .   Dental Screening: Recommended annual dental exams for proper oral hygiene  Community Resource Referral / Chronic Care Management: CRR required this visit?  No   CCM required this visit?  No      Plan:     I have personally reviewed and noted the following in the patient's chart:   Medical and social history Use of alcohol, tobacco or illicit drugs  Current medications and  supplements including opioid prescriptions. Patient is not currently taking opioid prescriptions. Functional ability and status Nutritional status Physical activity Advanced directives List of other physicians Hospitalizations, surgeries, and ER visits in previous 12 months Vitals Screenings to include cognitive, depression, and falls Referrals and appointments  In addition, I have reviewed and discussed with patient certain preventive protocols, quality metrics, and best practice recommendations. A written personalized care plan for preventive services as well as general preventive health recommendations were provided to patient.     JaKerin PernaCMKaiser [facility] 04/16/2022   Nurse Notes: Non Face-to-Face 20 minute visit   Ms. BrOwens Shark Thank you for taking time to come for your Medicare Wellness Visit. I appreciate your ongoing commitment to your health goals. Please review the following plan we discussed and let me know if I can assist you in the future.   These are the goals we discussed:  Goals      Blood Pressure < 140/90     Find Help in My Community     Timeframe:  Short-Term Goal Priority:  High Start Date:              03/24/21  Expected End Date:   06/13/21                    Follow Up Date 05/14/21   - follow-up on any referrals for help I am given    Why is this important?   Knowing how and where to find help for yourself or family in your neighborhood and community is an important skill.  You will want to take some steps to learn how.    Notes: patient was referred to [facility] BSW for assist with transportation resources and she spoke with BSW on 03/24/21; she will reach out in one month to provide transportation resources     HEMOGLOBIN A1C < 7.0     Obtain Eye Exam-Diabetes Type 2     Follow Up Date 08/15/21/22   - keep appointment with eye doctor - schedule appointment with eye doctor    Why is this important?   Eye check-ups are important when you have  diabetes.  Vision loss can be prevented.   Notes:Opthalmologic appointment scheduled for September.         This is a list of the screening recommended for you and due dates:  Health Maintenance  Topic Date Due   Hepatitis C Screening: USPSTF Recommendation to screen - Ages 33-79 yo.  Never done   Eye exam for diabetics  12/27/2017   Colon Cancer Screening  Never done   COVID-19 Vaccine (3 - Booster for Pfizer series) 09/20/2020   Complete foot exam   05/30/2021   Hemoglobin A1C  03/16/2022   Urine Protein Check  06/11/2022   Flu Shot  06/15/2022   Lipid (cholesterol) test  08/24/2022   Tetanus Vaccine  09/10/2024   Pap Smear  11/12/2025   HIV Screening  Completed   HPV Vaccine  Aged Out

## 2022-04-16 NOTE — Telephone Encounter (Signed)
glucose blood (ACCU-CHEK GUIDE)  [facility] ([facility]), [place] [facility] [place] Phone:  [phone]  Fax:  [phone]

## 2022-05-08 ENCOUNTER — Encounter: Payer: Self-pay | Admitting: *Deleted

## 2022-05-11 ENCOUNTER — Ambulatory Visit: Payer: Self-pay

## 2022-05-11 NOTE — Chronic Care Management (AMB) (Signed)
   05/11/2022  [name] Apr 25, 1971 [id number]  Care coordination case closed due to inability to reach patient. Jodelle Gross, RN, BSN, CCM Care Management Coordinator [facility] Phone: [phone]/Fax: [phone]

## 2022-05-31 NOTE — Progress Notes (Addendum)
[name] AWV was reviewed and accepted.

## 2022-06-03 ENCOUNTER — Other Ambulatory Visit: Payer: Self-pay | Admitting: Internal Medicine

## 2022-06-03 DIAGNOSIS — E1121 Type 2 diabetes mellitus with diabetic nephropathy: Secondary | ICD-10-CM

## 2022-06-04 NOTE — Telephone Encounter (Signed)
Please have patient follow-up for diabetes. 

## 2022-06-15 ENCOUNTER — Ambulatory Visit (HOSPITAL_COMMUNITY)
Admission: RE | Admit: 2022-06-15 | Discharge: 2022-06-15 | Disposition: A | Discharge status: Home / Self Care | Payer: Medicare Other | Source: Ambulatory Visit | Attending: Internal Medicine | Admitting: Internal Medicine

## 2022-06-15 ENCOUNTER — Ambulatory Visit (INDEPENDENT_AMBULATORY_CARE_PROVIDER_SITE_OTHER): Payer: Medicare Other | Admitting: Student

## 2022-06-15 ENCOUNTER — Telehealth: Payer: Self-pay | Admitting: Student

## 2022-06-15 VITALS — BP 115/74 | HR 95 | Temp 98.0°F | Wt 208.5 lb

## 2022-06-15 DIAGNOSIS — Z7984 Long term (current) use of oral hypoglycemic drugs: Secondary | ICD-10-CM | POA: Diagnosis not present

## 2022-06-15 DIAGNOSIS — I10 Essential (primary) hypertension: Secondary | ICD-10-CM | POA: Diagnosis not present

## 2022-06-15 DIAGNOSIS — D509 Iron deficiency anemia, unspecified: Secondary | ICD-10-CM

## 2022-06-15 DIAGNOSIS — E119 Type 2 diabetes mellitus without complications: Secondary | ICD-10-CM

## 2022-06-15 DIAGNOSIS — R55 Syncope and collapse: Secondary | ICD-10-CM | POA: Insufficient documentation

## 2022-06-15 LAB — POCT GLYCOSYLATED HEMOGLOBIN (HGB A1C): Hemoglobin A1C: 6.5 % — AB (ref 4.0–5.6)

## 2022-06-15 LAB — GLUCOSE, CAPILLARY: Glucose-Capillary: 82 mg/dL (ref 70–99)

## 2022-06-15 MED ORDER — ACCU-CHEK GUIDE VI STRP: ORAL_STRIP | 3 refills | Status: DC

## 2022-06-15 NOTE — Telephone Encounter (Signed)
Pt reporting being weak and light headed. Offered pt appointment for today and the pt states she only has transportation on Thursday.  Pt states she passed out last month and this past Sunday. Pt also reporting feeling short of breath when trying to walk and feels very sleepy.

## 2022-06-15 NOTE — Telephone Encounter (Signed)
Patient transferred to Triage. Explained importance of being seen today. She will call her sister to bring her to clinic today at 3:45.

## 2022-06-15 NOTE — Patient Instructions (Signed)
It was a pleasure seeing you in clinic. Today we discussed:   Lightheadedness and falls I am not sure what may be causing these episodes causing you to fall, your blood pressure and A1c look good. Please try to drink plenty of water and keep hydrated. If you have another episode please check you blood sugar to see if this may be causing your symptoms We will check labs and I will call with results Follow up with Cardiology, your EKG in office was normal today, however it appears you have a history of abnormal heart rhythm in the past and may benefit from wearing a heart monitor  Follow up in 1 month or sooner if symptoms are getting worse or more frequent  If you have any questions or concerns, please call our clinic at [phone] between 9am-5pm and after hours call [phone] and ask for the internal medicine resident on call. If you feel you are having a medical emergency please call 911.   Thank you, we look forward to helping you remain healthy!

## 2022-06-16 ENCOUNTER — Telehealth: Payer: Self-pay | Admitting: *Deleted

## 2022-06-16 ENCOUNTER — Encounter: Payer: Self-pay | Admitting: Student

## 2022-06-16 LAB — CBC WITH DIFFERENTIAL/PLATELET
Basophils Absolute: 0 10*3/uL (ref 0.0–0.2)
Basos: 0 %
EOS (ABSOLUTE): 0.1 10*3/uL (ref 0.0–0.4)
Eos: 2 %
Hematocrit: 22.9 % — ABNORMAL LOW (ref 34.0–46.6)
Hemoglobin: 6.9 g/dL — CL (ref 11.1–15.9)
Immature Grans (Abs): 0 10*3/uL (ref 0.0–0.1)
Immature Granulocytes: 1 %
Lymphocytes Absolute: 1.3 10*3/uL (ref 0.7–3.1)
Lymphs: 28 %
MCH: 19.9 pg — ABNORMAL LOW (ref 26.6–33.0)
MCHC: 30.1 g/dL — ABNORMAL LOW (ref 31.5–35.7)
MCV: 66 fL — ABNORMAL LOW (ref 79–97)
Monocytes Absolute: 0.6 10*3/uL (ref 0.1–0.9)
Monocytes: 12 %
Neutrophils Absolute: 2.6 10*3/uL (ref 1.4–7.0)
Neutrophils: 57 %
Platelets: 378 10*3/uL (ref 150–450)
RBC: 3.47 x10E6/uL — ABNORMAL LOW (ref 3.77–5.28)
RDW: 17.8 % — ABNORMAL HIGH (ref 11.7–15.4)
WBC: 4.6 10*3/uL (ref 3.4–10.8)

## 2022-06-16 LAB — CMP14 + ANION GAP
ALT: 7 IU/L (ref 0–32)
AST: 10 IU/L (ref 0–40)
Albumin/Globulin Ratio: 1.6 (ref 1.2–2.2)
Albumin: 4 g/dL (ref 3.9–4.9)
Alkaline Phosphatase: 64 IU/L (ref 44–121)
Anion Gap: 19 mmol/L — ABNORMAL HIGH (ref 10.0–18.0)
BUN/Creatinine Ratio: 5 — ABNORMAL LOW (ref 9–23)
BUN: 3 mg/dL — ABNORMAL LOW (ref 6–24)
Bilirubin Total: <0.2 mg/dL (ref 0.0–1.2)
CO2: 23 mmol/L (ref 20–29)
Calcium: 9.1 mg/dL (ref 8.7–10.2)
Chloride: 100 mmol/L (ref 96–106)
Creatinine, Ser: 0.56 mg/dL — ABNORMAL LOW (ref 0.57–1.00)
Globulin, Total: 2.5 g/dL (ref 1.5–4.5)
Glucose: 94 mg/dL (ref 70–99)
Potassium: 3.5 mmol/L (ref 3.5–5.2)
Sodium: 142 mmol/L (ref 134–144)
Total Protein: 6.5 g/dL (ref 6.0–8.5)
eGFR: 113 mL/min/{1.73_m2} (ref >59)

## 2022-06-16 NOTE — Telephone Encounter (Signed)
Dr. Lisabeth Devoid contacted the patient this morning. Symptoms are stable. Patient has declined blood transfusion. We will add on iron studies, if she is iron deficient we will arrange for IV iron infusion. Patient was counseled on the risk of this degree of anemia.

## 2022-06-16 NOTE — Assessment & Plan Note (Addendum)
Patient presents for fall 3 days ago.  She was going to a Jehovah witness convention, when shortly after getting out of the car and walking towards the building she felt lightheaded and short of breath causing her to fall forward onto outstretched hands.  She was able to stand up with assistance and attend service.  She has a small abrasion on her right knee and left thumb from the fall.  She also describes muscular pain in her right forearm, chest, and legs the day after the fall.  This has mostly resolved today.  She denies head trauma, headache, current chest pain or chest pain prior to her fall, weakness, or sensory changes.  She describes a similar incident 1 month ago with lightheadedness and shortness of breath after getting out of the car.  Reports she stumbled but did not fall and was able to walk to a building to sit down.  She is unsure if she ever lost consciousness with these episodes, but based on her history I do not suspect she did.  She overall has been feeling more tired and fatigued lately.  She denies any symptoms of bleeding, melena or blood in her stools.  She no longer has menstrual bleeding after hysterectomy in 2016.  She is not on blood thinners or NSAIDs.  Reports she has been eating and drinking normally and usually has 1-2 meals a day.  She is normotensive in office today.   Orthostatic vitals: Laying: BP 120/78 HR 95 Sitting 113/722 HR 95 Standing: 112/73 HR 105 Standing at 5 min 116/80 HR 106  Physical exam unrevealing.  EKG with normal sinus rhythm and no acute ischemic changes.  No obvious cause of her lightheadedness and shortness of breath.  We will check CBC and CMP for anemia or other electrolyte abnormalities.  Hypoglycemia may also be causing her symptoms.  She does not check her blood sugars at home.  Refilled l her glucose strips and have her repeat blood sugars if she has another episode.   She does also have a history of A- flutter previously on Xarelto and perhaps  may have paroxysmal arrhythmias that may be causing her symptoms.  We will refer her for cardiology evaluation and possible cardiac monitoring.  Addendum CBC with hemoglobin of 6.9.  MCV of 66, RDW 17.8.  Previous hemoglobin in 2020 was 12.9.  Suspect microcytic anemia is causing her symptoms.  I called patient to discuss results with her.  No signs of overt bleeding.  Did have symptomatic anemia in 2016 secondary to uterine fibroids for which she had a abdominal hysterectomy so unlikely this would be causing her current anemia.  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  We discussed the risk of this given her current anemia.  -Add on iron, TIBC, ferritin -IV iron if labs show iron deficiency -Hold off on cardiology referral -Once hemoglobin improved could consider diagnostic colonoscopy (no family history of colon cancer)  Addendum Iron/TIBC/Ferritin/ %Sat    Component Value Date/Time   IRON 9 (LL) 06/15/2022 1631   TIBC 376 06/15/2022 1631   FERRITIN 6 (L) 06/15/2022 1631   IRONPCTSAT 2 (LL) 06/15/2022 1631   IRONPCTSAT SEE NOTE 03/04/2015 1436   Iron studies c/w iron deficiency will order IV iron transfusion. Will have her follow up in clinic to evaluate how she feels after iron and monitor Hg.

## 2022-06-16 NOTE — Progress Notes (Signed)
Established Patient Office Visit  Subjective   Patient ID: [name], female    DOB: 03-Jan-1971  Age: 51 y.o. MRN: [id number]  Chief Complaint  Patient presents with   syncopal episode     Last month (unwitnessed)    Fall    Was getting out of car,remembering walking to and fell-now having pain on right side shoulder, chest, down right side     [name] is a 51 year old person with medical history listed below who presents for fall and presyncopal episodes on 06/10/2022 as well as approximately 1 month ago.Please refer to problem based charting for further details and assessment and plan of current problem and chronic medical conditions.  Patient Active Problem List   Diagnosis Date Noted   Rhinitis, nonallergic 10/16/2021   Sore throat 10/13/2021   Need for pneumococcal vaccination 02/19/2021   Hypertension 03/21/2018   Allergic rhinitis 01/25/2018   OSA (obstructive sleep apnea) 06/10/2017   Congenital hearing disorder 12/08/2016   Schizoaffective disorder, chronic condition ([place]) 06/18/2016   Microcytic anemia 03/04/2015   Hyperlipidemia LDL goal <70 09/10/2014   Obesity (BMI 30-39.9) 07/30/2014   Pap smear for cervical cancer screening 07/30/2014   Type 2 diabetes mellitus ([place]) 11/07/2012      Review of Systems  Constitutional:  Positive for malaise/fatigue. Negative for chills and fever.  HENT:  Negative for congestion, sinus pain and sore throat.   Eyes:  Negative for double vision and photophobia.  Respiratory:  Positive for shortness of breath.   Cardiovascular:  Negative for chest pain, palpitations, orthopnea and leg swelling.  Gastrointestinal:  Negative for abdominal pain, blood in stool, melena, nausea and vomiting.  Genitourinary:  Negative for dysuria, hematuria and urgency.  Musculoskeletal:  Positive for myalgias.  Neurological:  Positive for dizziness. Negative for focal weakness, seizures, weakness and headaches.      Objective:      BP 115/74 (BP Location: Left Arm, Patient Position: Sitting, Cuff Size: Normal)   Pulse 95   Temp 98 F (36.7 C) (Oral)   Wt 208 lb 8 oz (94.6 kg)   LMP 07/23/2015 (Exact Date)   SpO2 100%   BMI 35.79 kg/m  BP Readings from Last 3 Encounters:  06/15/22 115/74  12/17/21 121/78  10/16/21 119/73      Physical Exam Constitutional:      General: She is not in acute distress.    Appearance: Normal appearance. She is obese.  HENT:     Head: Normocephalic and atraumatic.     Mouth/Throat:     Mouth: Mucous membranes are moist.     Pharynx: Oropharynx is clear.  Eyes:     Extraocular Movements: Extraocular movements intact.     Conjunctiva/sclera: Conjunctivae normal.     Pupils: Pupils are equal, round, and reactive to light.  Cardiovascular:     Rate and Rhythm: Normal rate and regular rhythm.     Heart sounds: No murmur heard.    No friction rub. No gallop.     Comments: No JVD Pulmonary:     Effort: Pulmonary effort is normal.     Breath sounds: Normal breath sounds. No wheezing or rales.  Abdominal:     General: Abdomen is flat. Bowel sounds are normal. There is no distension.     Palpations: Abdomen is soft. There is no mass.     Tenderness: There is no abdominal tenderness.  Musculoskeletal:     Right lower leg: Right lower leg edema: trace.  Left lower leg: Edema (trace) present.  Skin:    General: Skin is warm and dry.     Capillary Refill: Capillary refill takes less than 2 seconds.     Coloration: Skin is not jaundiced.     Findings: No bruising.  Neurological:     General: No focal deficit present.     Mental Status: She is alert and oriented to person, place, and time. Mental status is at baseline.     Sensory: No sensory deficit.     Motor: No weakness.     Gait: Gait normal.  Psychiatric:        Mood and Affect: Mood normal.        Behavior: Behavior normal.      Last CBC Lab Results  Component Value Date   WBC 4.6 06/15/2022   HGB 6.9  (LL) 06/15/2022   HCT 22.9 (L) 06/15/2022   MCV 66 (L) 06/15/2022   MCH 19.9 (L) 06/15/2022   RDW 17.8 (H) 06/15/2022   PLT 378 17/40/8144   Last metabolic panel Lab Results  Component Value Date   GLUCOSE 94 06/15/2022   NA 142 06/15/2022   K 3.5 06/15/2022   CL 100 06/15/2022   CO2 23 06/15/2022   BUN 3 (L) 06/15/2022   CREATININE 0.56 (L) 06/15/2022   EGFR 113 06/15/2022   CALCIUM 9.1 06/15/2022   PHOS 4.0 03/06/2015   PROT 6.5 06/15/2022   ALBUMIN 4.0 06/15/2022   LABGLOB 2.5 06/15/2022   AGRATIO 1.6 06/15/2022   BILITOT <0.2 06/15/2022   ALKPHOS 64 06/15/2022   AST 10 06/15/2022   ALT 7 06/15/2022   ANIONGAP 10 10/06/2020         Assessment & Plan:   Problem List Items Addressed This Visit       Cardiovascular and Mediastinum   Hypertension    Blood pressure well controlled on current amlodipine and carvedilol.  She is having lightheadedness and dizziness however orthostatics are negative.  We will continue on current medications.        Endocrine   Type 2 diabetes mellitus (HCC)    A1c of 6.5% and well-controlled.  We will continue on metformin and Victoza.  She is out of to glucose test strips.  Will reorder this and have her check her blood sugar if she has further symptoms of lightheadedness or shortness of breath.      Relevant Medications   glucose blood (ACCU-CHEK GUIDE) test strip   Other Relevant Orders   POC Hbg A1C (Completed)     Other   Microcytic anemia    Patient presents for fall 3 days ago.  She was going to a [employer] witness convention, when shortly after getting out of the car and walking towards the building she felt lightheaded and short of breath causing her to fall forward onto outstretched hands.  She was able to stand up with assistance and attend service.  She has a small abrasion on her right knee and left thumb from the fall.  She also describes muscular pain in her right forearm, chest, and legs the day after the fall.  This  has mostly resolved today.  She denies head trauma, headache, current chest pain or chest pain prior to her fall, weakness, or sensory changes.  She describes a similar incident 1 month ago with lightheadedness and shortness of breath after getting out of the car.  Reports she stumbled but did not fall and was able to walk to a  building to sit down.  She is unsure if she ever lost consciousness with these episodes, but based on her history I do not suspect she did.  She overall has been feeling more tired and fatigued lately.  She denies any symptoms of bleeding, melena or blood in her stools.  She no longer has menstrual bleeding after hysterectomy in 2016.  She is not on blood thinners or NSAIDs.  Reports she has been eating and drinking normally and usually has 1-2 meals a day.  She is normotensive in office today.   Orthostatic vitals: Laying: BP 120/78 HR 95 Sitting 113/722 HR 95 Standing: 112/73 HR 105 Standing at 5 min 116/80 HR 106  Physical exam unrevealing.  EKG with normal sinus rhythm and no acute ischemic changes.  No obvious cause of her lightheadedness and shortness of breath.  We will check CBC and CMP for anemia or other electrolyte abnormalities.  Hypoglycemia may also be causing her symptoms.  She does not check her blood sugars at home.  Refilled l her glucose strips and have her repeat blood sugars if she has another episode.   She does also have a history of A- flutter previously on Xarelto and perhaps may have paroxysmal arrhythmias that may be causing her symptoms.  We will refer her for cardiology evaluation and possible cardiac monitoring.  Addendum CBC with hemoglobin of 6.9.  MCV of 66, RDW 17.8.  Previous hemoglobin in 2020 was 12.9.  Suspect microcytic anemia is causing her symptoms.  I called patient to discuss results with her.  No signs of overt bleeding.  Did have symptomatic anemia in 2016 secondary to uterine fibroids for which she had a abdominal hysterectomy so  unlikely this would be causing her current anemia.  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  We discussed the risk of this given her current anemia.  -Add on iron, TIBC, ferritin -IV iron if labs show iron deficiency -Follow-up in clinic next week -Hold off on cardiology referral -Once hemoglobin improved could consider diagnostic colonoscopy (no family history of colon cancer)      Other Visit Diagnoses     Syncope, unspecified syncope type    -  Primary   Relevant Orders   EKG 12-Lead (Completed)   CMP14 + Anion Gap (Completed)   CBC with Diff (Completed)   Iron, TIBC and Ferritin Panel       Return in about 1 week (around 06/22/2022).    Iona Beard, MD

## 2022-06-16 NOTE — Telephone Encounter (Addendum)
Received faxed results from [facility] with Alert on Hgb of 6.9. Handed to Attending at [facility]

## 2022-06-16 NOTE — Assessment & Plan Note (Signed)
Blood pressure well controlled on current amlodipine and carvedilol.  She is having lightheadedness and dizziness however orthostatics are negative.  We will continue on current medications.

## 2022-06-16 NOTE — Assessment & Plan Note (Signed)
A1c of 6.5% and well-controlled.  We will continue on metformin and Victoza.  She is out of to glucose test strips.  Will reorder this and have her check her blood sugar if she has further symptoms of lightheadedness or shortness of breath.

## 2022-06-17 ENCOUNTER — Telehealth: Payer: Self-pay | Admitting: *Deleted

## 2022-06-17 LAB — IRON,TIBC AND FERRITIN PANEL
Ferritin: 6 ng/mL — ABNORMAL LOW (ref 15–150)
Iron Saturation: 2 % — CL (ref 15–55)
Iron: 9 ug/dL — CL (ref 27–159)
Total Iron Binding Capacity: 376 ug/dL (ref 250–450)
UIBC: 367 ug/dL (ref 131–425)

## 2022-06-17 LAB — SPECIMEN STATUS REPORT

## 2022-06-17 NOTE — Telephone Encounter (Signed)
Patient was called an informed of ned to come in for a doctor's appointment on 06/23/2022 instaed of a lab appointment.  Point voiced understanding of and was transferred to the front desk to schedule the appointment.

## 2022-06-17 NOTE — Addendum Note (Signed)
Addended by: Iona Beard on: 06/17/2022 08:42 AM   Modules accepted: Orders

## 2022-06-18 NOTE — Telephone Encounter (Addendum)
First avail appt for IV Iron Mon 8/14 @ 12 noon. CMA had previously discussed with MD that pt will now return to clinic 1-2 wks after infusion.  Pt currently scheduled to see pcp on 08/28 and will keep that appt.

## 2022-06-23 ENCOUNTER — Telehealth: Payer: Self-pay | Admitting: *Deleted

## 2022-06-23 ENCOUNTER — Encounter: Payer: Self-pay | Admitting: Student

## 2022-06-23 ENCOUNTER — Ambulatory Visit: Payer: Medicare Other

## 2022-06-23 ENCOUNTER — Observation Stay (HOSPITAL_COMMUNITY): Payer: Medicare Other

## 2022-06-23 ENCOUNTER — Encounter (HOSPITAL_COMMUNITY): Payer: Self-pay

## 2022-06-23 ENCOUNTER — Other Ambulatory Visit: Payer: Medicare Other

## 2022-06-23 ENCOUNTER — Ambulatory Visit (INDEPENDENT_AMBULATORY_CARE_PROVIDER_SITE_OTHER): Payer: Medicare Other | Admitting: Student

## 2022-06-23 ENCOUNTER — Inpatient Hospital Stay (HOSPITAL_COMMUNITY)
Admission: RE | Admit: 2022-06-23 | Discharge: 2022-06-26 | DRG: 812 | Disposition: A | Discharge status: Home / Self Care | Payer: Medicare Other | Attending: Internal Medicine | Admitting: Internal Medicine

## 2022-06-23 VITALS — BP 125/75 | HR 81 | Temp 98.5°F | Ht 64.0 in | Wt 213.9 lb

## 2022-06-23 DIAGNOSIS — E119 Type 2 diabetes mellitus without complications: Secondary | ICD-10-CM | POA: Diagnosis present

## 2022-06-23 DIAGNOSIS — K922 Gastrointestinal hemorrhage, unspecified: Secondary | ICD-10-CM | POA: Diagnosis present

## 2022-06-23 DIAGNOSIS — Z8249 Family history of ischemic heart disease and other diseases of the circulatory system: Secondary | ICD-10-CM

## 2022-06-23 DIAGNOSIS — Z8679 Personal history of other diseases of the circulatory system: Secondary | ICD-10-CM

## 2022-06-23 DIAGNOSIS — D509 Iron deficiency anemia, unspecified: Secondary | ICD-10-CM | POA: Diagnosis present

## 2022-06-23 DIAGNOSIS — Z79899 Other long term (current) drug therapy: Secondary | ICD-10-CM

## 2022-06-23 DIAGNOSIS — Z91041 Radiographic dye allergy status: Secondary | ICD-10-CM

## 2022-06-23 DIAGNOSIS — Z882 Allergy status to sulfonamides status: Secondary | ICD-10-CM

## 2022-06-23 DIAGNOSIS — I10 Essential (primary) hypertension: Secondary | ICD-10-CM | POA: Diagnosis not present

## 2022-06-23 DIAGNOSIS — K219 Gastro-esophageal reflux disease without esophagitis: Secondary | ICD-10-CM | POA: Diagnosis present

## 2022-06-23 DIAGNOSIS — D500 Iron deficiency anemia secondary to blood loss (chronic): Principal | ICD-10-CM | POA: Diagnosis present

## 2022-06-23 DIAGNOSIS — Z833 Family history of diabetes mellitus: Secondary | ICD-10-CM

## 2022-06-23 DIAGNOSIS — R197 Diarrhea, unspecified: Secondary | ICD-10-CM | POA: Diagnosis present

## 2022-06-23 DIAGNOSIS — Z90710 Acquired absence of both cervix and uterus: Secondary | ICD-10-CM

## 2022-06-23 DIAGNOSIS — G4733 Obstructive sleep apnea (adult) (pediatric): Secondary | ICD-10-CM | POA: Diagnosis present

## 2022-06-23 DIAGNOSIS — E785 Hyperlipidemia, unspecified: Secondary | ICD-10-CM | POA: Diagnosis present

## 2022-06-23 DIAGNOSIS — G809 Cerebral palsy, unspecified: Secondary | ICD-10-CM | POA: Diagnosis present

## 2022-06-23 DIAGNOSIS — F259 Schizoaffective disorder, unspecified: Secondary | ICD-10-CM | POA: Diagnosis present

## 2022-06-23 DIAGNOSIS — F32A Depression, unspecified: Secondary | ICD-10-CM | POA: Diagnosis present

## 2022-06-23 DIAGNOSIS — Z531 Procedure and treatment not carried out because of patient's decision for reasons of belief and group pressure: Secondary | ICD-10-CM | POA: Diagnosis present

## 2022-06-23 DIAGNOSIS — Z888 Allergy status to other drugs, medicaments and biological substances status: Secondary | ICD-10-CM

## 2022-06-23 DIAGNOSIS — D649 Anemia, unspecified: Principal | ICD-10-CM | POA: Diagnosis present

## 2022-06-23 DIAGNOSIS — Z794 Long term (current) use of insulin: Secondary | ICD-10-CM

## 2022-06-23 DIAGNOSIS — R55 Syncope and collapse: Secondary | ICD-10-CM | POA: Diagnosis present

## 2022-06-23 DIAGNOSIS — IMO0001 Reserved for inherently not codable concepts without codable children: Secondary | ICD-10-CM

## 2022-06-23 DIAGNOSIS — Z7984 Long term (current) use of oral hypoglycemic drugs: Secondary | ICD-10-CM

## 2022-06-23 DIAGNOSIS — E876 Hypokalemia: Secondary | ICD-10-CM | POA: Diagnosis present

## 2022-06-23 LAB — CBC
HCT: 22.1 % — ABNORMAL LOW (ref 36.0–46.0)
Hemoglobin: 6.4 g/dL — CL (ref 12.0–15.0)
MCH: 18.9 pg — ABNORMAL LOW (ref 26.0–34.0)
MCHC: 29 g/dL — ABNORMAL LOW (ref 30.0–36.0)
MCV: 65.4 fL — ABNORMAL LOW (ref 80.0–100.0)
Platelets: 259 10*3/uL (ref 150–400)
RBC: 3.38 MIL/uL — ABNORMAL LOW (ref 3.87–5.11)
RDW: 19.6 % — ABNORMAL HIGH (ref 11.5–15.5)
WBC: 6.5 10*3/uL (ref 4.0–10.5)
nRBC: 0 % (ref 0.0–0.2)

## 2022-06-23 LAB — COMPREHENSIVE METABOLIC PANEL
ALT: 12 U/L (ref 0–44)
AST: 13 U/L — ABNORMAL LOW (ref 15–41)
Albumin: 3.5 g/dL (ref 3.5–5.0)
Alkaline Phosphatase: 53 U/L (ref 38–126)
Anion gap: 6 (ref 5–15)
BUN: <5 mg/dL — ABNORMAL LOW (ref 6–20)
CO2: 27 mmol/L (ref 22–32)
Calcium: 8.9 mg/dL (ref 8.9–10.3)
Chloride: 103 mmol/L (ref 98–111)
Creatinine, Ser: 0.54 mg/dL (ref 0.44–1.00)
GFR, Estimated: >60 mL/min (ref >60)
Glucose, Bld: 110 mg/dL — ABNORMAL HIGH (ref 70–99)
Potassium: 3.4 mmol/L — ABNORMAL LOW (ref 3.5–5.1)
Sodium: 136 mmol/L (ref 135–145)
Total Bilirubin: 0.4 mg/dL (ref 0.3–1.2)
Total Protein: 6.5 g/dL (ref 6.5–8.1)

## 2022-06-23 LAB — GLUCOSE, CAPILLARY
Glucose-Capillary: 116 mg/dL — ABNORMAL HIGH (ref 70–99)
Glucose-Capillary: 94 mg/dL (ref 70–99)

## 2022-06-23 MED ORDER — INSULIN ASPART 100 UNIT/ML IJ SOLN
0.0000 [IU] | Freq: Three times a day (TID) | INTRAMUSCULAR | Status: DC
  Administered 2022-06-24 – 2022-06-25 (×3): 2 [IU] via SUBCUTANEOUS
  Administered 2022-06-25: 3 [IU] via SUBCUTANEOUS
  Administered 2022-06-26: 2 [IU] via SUBCUTANEOUS

## 2022-06-23 MED ORDER — ACCU-CHEK FASTCLIX LANCETS MISC: 3 refills | Status: DC

## 2022-06-23 MED ORDER — ATORVASTATIN CALCIUM 40 MG PO TABS
40.0000 mg | ORAL_TABLET | Freq: Every day | ORAL | Status: DC
  Administered 2022-06-23 – 2022-06-26 (×4): 40 mg via ORAL
  Filled 2022-06-23 (×4): qty 1

## 2022-06-23 MED ORDER — POTASSIUM CHLORIDE CRYS ER 20 MEQ PO TBCR
40.0000 meq | EXTENDED_RELEASE_TABLET | Freq: Once | ORAL | Status: AC
Start: 2022-06-23 — End: 2022-06-23
  Administered 2022-06-23: 40 meq via ORAL
  Filled 2022-06-23: qty 2

## 2022-06-23 MED ORDER — BENZTROPINE MESYLATE 0.5 MG PO TABS
0.5000 mg | ORAL_TABLET | Freq: Two times a day (BID) | ORAL | Status: DC
  Administered 2022-06-23 – 2022-06-26 (×6): 0.5 mg via ORAL
  Filled 2022-06-23 (×7): qty 1

## 2022-06-23 MED ORDER — SODIUM CHLORIDE 0.9 % IV SOLN
250.0000 mg | Freq: Every day | INTRAVENOUS | Status: AC
  Administered 2022-06-23 – 2022-06-26 (×4): 250 mg via INTRAVENOUS
  Filled 2022-06-23 (×4): qty 20

## 2022-06-23 MED ORDER — DULOXETINE HCL 30 MG PO CPEP
30.0000 mg | ORAL_CAPSULE | Freq: Every day | ORAL | Status: DC
  Administered 2022-06-23 – 2022-06-26 (×4): 30 mg via ORAL
  Filled 2022-06-23 (×4): qty 1

## 2022-06-23 MED ORDER — PANTOPRAZOLE SODIUM 40 MG PO TBEC
40.0000 mg | DELAYED_RELEASE_TABLET | Freq: Every day | ORAL | Status: DC
  Administered 2022-06-23 – 2022-06-26 (×4): 40 mg via ORAL
  Filled 2022-06-23 (×4): qty 1

## 2022-06-23 NOTE — Patient Instructions (Signed)
Thank you, [name] for allowing [place] to provide your care today. Today we discussed your low blood counts (anemia) and your worsening symptoms. We think it would be best for you to be seen in the hospital, so that you can get iron supplementation in your veins and be monitored. We are currently waiting on a bed for you to be admitted; but we have ordered some labs to start the process of monitoring your blood counts and evaluate your medical needs.    My Chart Access: [email or web address]  Please follow-up in in the next few weeks.  Please make sure to arrive 15 minutes prior to your next appointment. If you arrive late, you may be asked to reschedule.    We look forward to seeing you next time. Please call our clinic at [phone] if you have any questions or concerns. The best time to call is Monday-Friday from 9am-4pm, but there is someone available 24/7. If after hours or the weekend, call the main hospital number and ask for the Internal Medicine Resident On-Call. If you need medication refills, please notify your pharmacy one week in advance and they will send [place] a request.   Thank you for letting us take part in your care. Wishing you the best!  Romana Juniper, MD 06/23/2022, 9:26 AM [facility] Internal Medicine Resident, PGY-1

## 2022-06-23 NOTE — H&P (Cosign Needed Addendum)
NAME:  [name], MRN:  [id number], DOB:  01/17/1971, LOS: 0 ADMISSION DATE:  06/23/2022, Primary: Nani Gasser, MD  CHIEF COMPLAINT:  dyspnea   Medical Service: Internal Medicine Teaching Service         Attending Physician: Dr. Lottie Mussel, MD    First Contact: Dr. Sanjuana Mae Pager: [phone]  Second Contact: Dr. Collene Gobble Pager: [phone]       After Hours (After 5p/  First Contact Pager: [phone]  weekends / holidays): Second Contact Pager: [place]   [name] is 51yo person with type II diabetes mellitus, hyperlipidemia, hypertension, obstructive sleep apnea, and schizoaffective disorder presenting to [facility] after [facility] visit this morning for continued dyspnea and fatigue. [name] reports she had a syncopal episode without prodromal symptoms about a week and a half ago. States she regained consciousness quickly and returned back to baseline immediately, did not hit her head during the fall. She has not experienced a syncopal episode or fall since the initial episode. Since this time, she reports dyspnea, overall fatigue, generalized weakness, and lightheadedness. Mentions she has not been using the shower as she is afraid she might fall. Also notes that she has not had an appetite and has not eaten well over this time. [name] does report intermittent chills and an episode of diarrhea today. Denies melena, but reports dark [place] stool over the last few weeks. Denies any fevers, chest pain, abdominal pain, nausea, vomiting, constipation, hematochezia. She had an appointment with [facility] 8/1 for syncope and returned earlier this morning due to continued symptoms. Due to her severe symptoms and low hemoglobin, patient was directly admitted to IMTS.  PCP: Nani Gasser, MD  PAST MEDICAL HISTORY   She,  has a past medical history of Anemia, Congenital cerebral palsy ([place]), Depression, Diabetes mellitus due to abnormal insulin ([place]), Edema (06/10/2017),  Endometriosis, Hearing impairment, Heart murmur, Hypertension, Neuromuscular disorder ([place]), Schizoaffective disorder ([place]), and Thyroid nodule (07/30/2014).   HOME MEDICATIONS   Prior to Admission medications   Medication Sig Start Date End Date Taking? Authorizing Provider  amLODipine (NORVASC) 5 MG tablet TAKE 1 TABLET (5 MG TOTAL) BY MOUTH DAILY. 04/07/22 04/07/23  Orvis Brill, MD  atorvastatin (LIPITOR) 20 MG tablet Take 2 tablets (40 mg total) by mouth daily. 08/24/21   Demaio, Alexa, MD  benzonatate (TESSALON PERLES) 100 MG capsule Take 1 capsule (100 mg total) by mouth 3 (three) times daily as needed for cough. 02/08/22 02/08/23  Delene Ruffini, MD  benztropine (COGENTIN) 0.5 MG tablet Take 0.5 mg by mouth 2 (two) times daily. MORNING (0900) AND NIGHT (2100) 11/10/16   [provider]  carvedilol (COREG) 6.25 MG tablet TAKE ONE (1) TABLET BY MOUTH TWO (2) TIMES DAILY WITH A MEAL 12/01/21   Orvis Brill, MD  cetirizine (ZYRTEC ALLERGY) 10 MG tablet Take 1 tablet (10 mg total) by mouth daily. 01/25/18   Ina Homes, MD  DULoxetine (CYMBALTA) 30 MG capsule Take 30 mg by mouth daily. 11/12/16   [provider]  fluticasone (FLONASE) 50 MCG/ACT nasal spray Place 1 spray into both nostrils daily. 10/16/21 10/16/22  Farrel Gordon, DO  glucose blood (ACCU-CHEK GUIDE) test strip Check blood sugar one times a day as instructed 06/15/22   Iona Beard, MD  Insulin Pen Needle 32G X 4 MM MISC USE TO INJECT INSULIN ONCE DAILY. DIAGNOSIS CODE E11.21 10/16/21 10/16/22  Farrel Gordon, DO  liraglutide (VICTOZA) 18 MG/3ML SOPN INJECT 1.'8MG'$  INTO SKIN ONCE  WEEKLY AS DIRECTED 06/04/22   Masters, Joellen Jersey, DO  metFORMIN (GLUCOPHAGE) 1000 MG tablet TAKE 1 TABLET BY MOUTH 2 TIMES DAILY 12/17/21 12/17/22  Orvis Brill, MD  oxymetazoline (AFRIN NASAL SPRAY) 0.05 % nasal spray Place 1 spray into both nostrils 2 (two) times daily. 10/16/21   Farrel Gordon, DO  paliperidone (INVEGA SUSTENNA) 156  MG/ML SUSP injection Inject 156 mg into the muscle every 30 (thirty) days.     [provider]    ALLERGIES   Allergies as of 06/23/2022 - Review Complete 06/23/2022  Allergen Reaction Noted   Bactrim [sulfamethoxazole-trimethoprim] Anaphylaxis, Shortness Of Breath, and Swelling 08/20/2015   Lisinopril Cough 05/30/2020   Losartan Cough 05/30/2020   Bee pollen Other (See Comments) 03/04/2015   Ivp dye [iodinated contrast media] Nausea And Vomiting 11/07/2012   Metrizamide Nausea And Vomiting 11/07/2012   Pollen extract  03/04/2015   Sulfa antibiotics Swelling 08/20/2015   Sulfasalazine Swelling 08/20/2015   Other Rash 07/22/2016   Peanut-containing drug products Rash 07/22/2016    SOCIAL HISTORY   [name] has lived here in [place] since 1992 and currently lives with her 75-yo son. She can perform her ADL's and IADL's independently, although reports her son sometimes will cook for her. Denies current or history of tobacco use, alcohol, or recreational drug use.  FAMILY HISTORY   Her family history includes Cancer in her mother; Diabetes Mellitus II in her father and mother; Heart failure in her mother; Hypertension in her father and mother.   REVIEW OF SYSTEMS   ROS per history of present illness.  PHYSICAL EXAMINATION   Blood pressure 138/84, pulse (!) 103, temperature 98.2 F (36.8 C), temperature source Oral, resp. rate 20, last menstrual period 07/23/2015, SpO2 100 %.    There were no vitals filed for this visit.  GENERAL: Comfortable appearing, laying in bed in no acute distress HENT: Normocephalic, atraumatic. Neck supple.  EYES: Vision grossly in tact. No scleral icterus. Conjunctival pallor present. CV: Regular rate, rhythm. No murmurs appreciated. Distal pulses 2+ bilaterally. PULM: Normal work of breathing on room air. Clear to ausculation bilaterally. GI: Abdomen soft, non-tender, non-distended. Normoactive bowel soundss MSK: Normal bulk, tone.  Trace-1+ pitting edema to ankles bilaterally SKIN: Warm, dry. No rashes or lesions appreciated. Good skin turgor.  NEURO: Awake, alert, conversing appropriately. Grossly non-focal PSYCH: Normal mood, affect, speech.  LABS      Latest Ref Rng & Units 06/23/2022    9:04 AM 06/15/2022    4:31 PM 10/06/2020    5:20 PM  CBC  WBC 4.0 - 10.5 K/uL 6.5  4.6  4.3   Hemoglobin 12.0 - 15.0 g/dL 6.4  6.9  12.9   Hematocrit 36.0 - 46.0 % 22.1  22.9  39.9   Platelets 150 - 400 K/uL 259  378  273       Latest Ref Rng & Units 06/23/2022    9:04 AM 06/15/2022    4:31 PM 08/24/2021   11:36 AM  BMP  Glucose 70 - 99 mg/dL 110  94  168   BUN 6 - 20 mg/dL '5  3  7   '$ Creatinine 0.44 - 1.00 mg/dL 0.54  0.56  0.58   BUN/Creat Ratio 9 - '23  5  12   '$ Sodium 135 - 145 mmol/L 136  142  139   Potassium 3.5 - 5.1 mmol/L 3.4  3.5  3.3   Chloride 98 - 111 mmol/L 103  100  98   CO2  22 - 32 mmol/L '27  23  22   '$ Calcium 8.9 - 10.3 mg/dL 8.9  9.1  9.1     ASSESSMENT   Girtrude Enslin is 51yo person with type II diabetes mellitus, hyperlipidemia, hypertension, obstructive sleep apnea, and schizoaffective disorder admitted 8/9 with symptomatic anemia.  PLAN   Principal Problem:   Symptomatic anemia  #Symptomatic anemia #Iron deficiency anemia #Jehovah's witness Ms. Ravert is presenting today after subacute onset of constellation of dyspnea, generalized weakness, fatigue, dizziness, and decreased appetite. She has no signs or evidence of infection. She is hemodynamically stable, sating well on room air. Lab work from the clinic over the last week revealed Hgb 6.4<6.9 from 8/8<8/1. Iron studies from 8/1 revealed ferritin 6, sat 2%, iron 9. Per Nile Riggs equation, ~'1800mg'$  iron deficit.This is most certainly contributing to her symptoms. No other known sources of bleeding, no abdominal pain/no new urinary symptoms. She has not noticed any melena or hematochezia, but given her significant anemia without other known causes, she  will need GI follow-up for colonoscopy/EGD (she has not had this in the past). Of note, she has no stigmata of cirrhosis on exam. FOBT was deferred today given her hemodynamic stability in setting of no melena/hematochezia. Ms. Grace is a [employer] witness and has signed consent to refuse blood products. Therefore, we will plan to give IV iron, can also consider addition of EPO.  - IV ferrlicet '250mg'$  this evening - Follow-up CBC in AM - Follow-up GI outpatient - Telemetry  #Syncope Today patient reports she did not have any prodromal symptoms prior to her syncope last week. Per clinic note from 8/1, she did report some symptoms prior to her episode. Orthostatic vital signs obtained in clinic at that time were normal. There was a concern for possible cardiogenic causes, as she does have a remote history of paroxysmal atrial fibrillation and was previously on Xarelto. We will complete the work-up and obtain ECG and Echocardiogram while she is here.  - Follow-up ECG, Echo - Telemetry  #Type II diabetes mellitus Last A1c 6.5%. She appears to be on GLP-1 and metformin at home. We'll place on sliding scale, adjust as needed - SSI  #Hypertension Blood pressure at goal on arrival. Given her significant anemia, we will hold off re-starting these now. If BP starts to increase, we can add these back on. - Hold home amlodipine, carvedilol  #Hypokalemia K 3.4 in clinic this morning. Likely due to poor po intake over the last week. We'll orally replete today, follow-up labs in the morning. - Klor-Con 72mq once - BMP, Mg in AM  #Schizoaffective disorder Stable, will continue home medications. - Benztropine 0.'5mg'$  twice daily - Duloxetine '30mg'$  daily  #GERD - Pantoprazole '40mg'$  daily  #Obstructive sleep apnea - CPAP QHS  BEST PRACTICE   DIET: CM IVF: n/a DVT PPX: SCD BOWEL: n/a CODE: FULL FAM COM: n/a  DISPO: Admit patient to Observation with expected length of stay less than 2  midnights.  PSanjuan Dame MD Internal Medicine Resident PGY-3 Pager #[phone] PM

## 2022-06-23 NOTE — Assessment & Plan Note (Addendum)
Patient returning to clinic for anemia follow up, diagnosed last visit with Hbg 6.9, MCV 66, Ferritin 6, Iron 9, Iron sat 2%. Iron infusion is scheduled for 06/25/2022. However, patient reports that since last visit, she has had intense cold, shaking episodes that get better with blankets, increased fatigue and tiredness and SOB with minimal movement. Of note, patient has been somnolent and able to sleep for a whole day without eating meals throughout the day. She has stopped driving. Denies chest pain, palpitations, changes in vision, falls since last visit. Patient does not menstruate and reports no overt bleeding, but did have an episode of very dark [place] stool yesterday night. Patient denies hx of epigastric pain or reflux, only episodic when she eats fatty food, but denies frequent consumption. Patient denies oral iron supplementation for the past week. Patient was offered and asked about blood products; patient denied, but is open to IV iron infusion.  Today, patient is afebrile and normotensive, no orthostatic hypotension, but tachycardic. +conjuctival pallor, and poor skin turgor, no ecchymosses noted on exam. No abnormalities on CV or pulmonary physical exam. Patient is somnolent and delayed in her responses, but otherwise. Given her worsening symptoms, clinical presentation, and microcytic anemia, suspect that her Hgb has continued to decreased. Unclear etiology of microcytic anemia, but suspect GI bleed. Patient would benefit of inpatient IV iron infusion and EPO administration with close monitoring. Patient has never had a screening colonoscopy; patient would also benefit from diagnostic EGD, colonoscopy for suspected GI bleeding.  - Admit to hospital - IV iron infusion - EPO administration - STAT CBC, CMP today'  Addendum: Hgb 6.4, mild hypokalemia (3.4) and decrease in AST (13). Patient informed while still in the clinic while waiting for admission. Patient left clinic prior to being admitted  to the hospital. If patient not admitted by the morning of 8/10, will give a call back to assess status.

## 2022-06-23 NOTE — Telephone Encounter (Signed)
Critical lab reported from [facility] in [facility] Lab 6.4 Hemoglobin.   Reported to Dr. Evette Doffing.

## 2022-06-23 NOTE — Assessment & Plan Note (Addendum)
Normotensive today; patient has continued taking her BP medications, which include Amlodipine 10 mg and Carvedilol. Patient endorsing episodic lightheadedness and dizziness. No orthostatic hypotension today. Suspect episodes of lightheadedness and dizziness, likely in the setting of IDA. - Stop Amlodipine and Carvedilol - Monitor BP - Reassess need for antihypertensive medications while hospitalized

## 2022-06-23 NOTE — Progress Notes (Signed)
Subjective:  CC: feeling weak  HPI:  [name] is a 51 y.o. female with a past medical history stated below and presents today for anemia follow up. Please see problem based assessment and plan for additional details.  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 US Neck 11/06/14: Bilateral nodules. Dominant right lower pole nodule measures 2.3 cm. Findings meet consensus criteria for biopsy. Ultrasound-guided fine needle aspiration should be considered Aspiration pathology: benign follicular nodule    Current Outpatient Medications on File Prior to Visit  Medication Sig Dispense Refill   amLODipine (NORVASC) 5 MG tablet TAKE 1 TABLET (5 MG TOTAL) BY MOUTH DAILY. 90 tablet 3   atorvastatin (LIPITOR) 20 MG tablet Take 2 tablets (40 mg total) by mouth daily. 90 tablet 3   benzonatate (TESSALON PERLES) 100 MG capsule Take 1 capsule (100 mg total) by mouth 3 (three) times daily as needed for cough. 30 capsule 1   benztropine (COGENTIN) 0.5 MG tablet Take 0.5 mg by mouth 2 (two) times daily. MORNING (0900) AND NIGHT (2100)     carvedilol (COREG) 6.25 MG tablet TAKE ONE (1) TABLET BY MOUTH TWO (2) TIMES DAILY WITH A MEAL 120 tablet 3   cetirizine (ZYRTEC ALLERGY) 10 MG tablet Take 1 tablet (10 mg total) by mouth daily. 30 tablet 2   DULoxetine (CYMBALTA) 30 MG capsule Take 30 mg by mouth daily.     fluticasone (FLONASE) 50 MCG/ACT nasal spray Place 1 spray into both nostrils daily. 9.9 mL 0   glucose blood (ACCU-CHEK  GUIDE) test strip Check blood sugar one times a day as instructed 100 each 3   Insulin Pen Needle 32G X 4 MM MISC USE TO INJECT INSULIN ONCE DAILY. DIAGNOSIS CODE E11.21 100 each 1   liraglutide (VICTOZA) 18 MG/3ML SOPN INJECT 1.'8MG'$  INTO SKIN ONCE WEEKLY AS DIRECTED 9 mL 5   metFORMIN (GLUCOPHAGE) 1000 MG tablet TAKE 1 TABLET BY MOUTH 2 TIMES DAILY 180 tablet 3   oxymetazoline (AFRIN NASAL SPRAY) 0.05 % nasal spray Place 1 spray into both nostrils 2 (two) times daily. 30 mL 0   paliperidone (INVEGA SUSTENNA) 156 MG/ML SUSP injection Inject 156 mg into the muscle every 30 (thirty) days.      No current facility-administered medications on file prior to visit.    Family History  Problem Relation Age of Onset   Cancer Mother    Heart failure Mother    Hypertension Mother    Diabetes Mellitus II Mother    Diabetes Mellitus II Father    Hypertension Father     Social History   Socioeconomic History   Marital status: Married    Spouse name: Not on file   Number of children: Not on file   Years of education: Not on file   Highest education level: Not on file  Occupational History   Not on file  Tobacco Use  Smoking status: Never   Smokeless tobacco: Never  Substance and Sexual Activity   Alcohol use: No   Drug use: No   Sexual activity: Not Currently    Birth control/protection: Surgical  Other Topics Concern   Not on file  Social History Narrative   Not on file   Social Determinants of Health   Financial Resource Strain: Low Risk  (04/16/2022)   Overall Financial Resource Strain (CARDIA)    Difficulty of Paying Living Expenses: Not hard at all  Food Insecurity: Food Insecurity Present (04/16/2022)   Hunger Vital Sign    Worried About Running Out of Food in the Last Year: Often true    Ran Out of Food in the Last Year: Often true  Transportation Needs: No Transportation Needs (04/16/2022)   PRAPARE - Hydrologist (Medical): No    Lack of  Transportation (Non-Medical): No  Physical Activity: Inactive (04/16/2022)   Exercise Vital Sign    Days of Exercise per Week: 0 days    Minutes of Exercise per Session: 0 min  Stress: No Stress Concern Present (04/16/2022)   [place]    Feeling of Stress : Only a little  Social Connections: Moderately Isolated (04/16/2022)   Social Connection and Isolation Panel [NHANES]    Frequency of Communication with Friends and Family: More than three times a week    Frequency of Social Gatherings with Friends and Family: Once a week    Attends Religious Services: Never    Marine scientist or Organizations: No    Attends Archivist Meetings: Never    Marital Status: Married  Human resources officer Violence: Not At Risk (04/16/2022)   Humiliation, Afraid, Rape, and Kick questionnaire    Fear of Current or Ex-Partner: No    Emotionally Abused: No    Physically Abused: No    Sexually Abused: No    Review of Systems: ROS negative except for what is noted on the assessment and plan.  Objective:   Vitals:   06/23/22 0821  BP: 125/75  Pulse: 81  Temp: 98.5 F (36.9 C)  TempSrc: Oral  SpO2: 100%  Weight: 213 lb 14.4 oz (97 kg)  Height: '5\' 4"'$  (1.626 m)    Physical Exam: Constitutional: ill-appearing woman sitting in exam chair in mild distress HENT: normocephalic atraumatic, dry membranes , sublingual pallor Eyes: conjunctival pallor Neck: supple Cardiovascular: tachycardia, regular rhythm, no m/r/g Pulmonary/Chest: normal work of breathing on room air, lungs clear to auscultation bilaterally Abdominal: soft, non-tender, non-distended MSK: normal bulk and tone Neurological: alert & oriented x 3, 5/5 strength in bilateral upper and lower extremities, normal gait Skin: warm and dry Psych: sad mood and affect     Assessment & Plan:   Microcytic anemia Patient returning to clinic for anemia follow up, diagnosed  last visit with Hbg 6.9, MCV 66, Ferritin 6, Iron 9, Iron sat 2%. Iron infusion is scheduled for 06/25/2022. However, patient reports that since last visit, she has had intense cold, shaking episodes that get better with blankets, increased fatigue and tiredness and SOB with minimal movement. Of note, patient has been somnolent and able to sleep for a whole day without eating meals throughout the day. She has stopped driving. Denies chest pain, palpitations, changes in vision, falls since last visit. Patient does not menstruate and reports no overt bleeding, but did have an episode of very dark [place] stool yesterday night. Patient denies  hx of epigastric pain or reflux, only episodic when she eats fatty food, but denies frequent consumption. Patient denies oral iron supplementation for the past week. Patient was offered and asked about blood products; patient denied, but is open to IV iron infusion.  Today, patient is afebrile and normotensive, no orthostatic hypotension, but tachycardic. +conjuctival pallor, and poor skin turgor, no ecchymosses noted on exam. No abnormalities on CV or pulmonary physical exam. Patient is somnolent and delayed in her responses, but otherwise. Given her worsening symptoms, clinical presentation, and microcytic anemia, suspect that her Hgb has continued to decreased. Unclear etiology of microcytic anemia, but suspect GI bleed. Patient would benefit of inpatient IV iron infusion and EPO administration with close monitoring. Patient has never had a screening colonoscopy; patient would also benefit from diagnostic EGD, colonoscopy for suspected GI bleeding.  - Admit to hospital - IV iron infusion - EPO administration - STAT CBC, CMP today  Hypertension Normotensive today; patient has continued taking her BP medications, which include Amlodipine 10 mg and Carvedilol. Patient endorsing episodic lightheadedness and dizziness. No orthostatic hypotension today. Suspect episodes of  lightheadedness and dizziness, likely in the setting of IDA. - Stop Amlodipine and Carvedilol - Monitor BP - Reassess need for antihypertensive medications while hospitalized   Microcytic anemia Patient returning to clinic for anemia follow up, diagnosed last visit with Hbg 6.9, MCV 66, Ferritin 6, Iron 9, Iron sat 2%. Iron infusion is scheduled for 06/25/2022. However, patient reports that since last visit, she has had intense cold, shaking episodes that get better with blankets, increased fatigue and tiredness and SOB with minimal movement. Of note, patient has been somnolent and able to sleep for a whole day without eating meals throughout the day. She has stopped driving. Denies chest pain, palpitations, changes in vision, falls since last visit. Patient does not menstruate and reports no overt bleeding, but did have an episode of very dark [place] stool yesterday night. Patient denies hx of epigastric pain or reflux, only episodic when she eats fatty food, but denies frequent consumption. Patient denies oral iron supplementation for the past week. Patient was offered and asked about blood products; patient denied, but is open to IV iron infusion.  Today, patient is afebrile and normotensive, no orthostatic hypotension, but tachycardic. +conjuctival pallor, and poor skin turgor, no ecchymosses noted on exam. No abnormalities on CV or pulmonary physical exam. Patient is somnolent and delayed in her responses, but otherwise. Given her worsening symptoms, clinical presentation, and microcytic anemia, suspect that her Hgb has continued to decreased. Unclear etiology of microcytic anemia, but suspect GI bleed. Patient would benefit of inpatient IV iron infusion and EPO administration with close monitoring. Patient has never had a screening colonoscopy; patient would also benefit from diagnostic EGD, colonoscopy for suspected GI bleeding.  - Admit to hospital - IV iron infusion - EPO administration - STAT  CBC, CMP today  Hypertension Normotensive today; patient has continued taking her BP medications, which include Amlodipine 10 mg and Carvedilol. Patient endorsing episodic lightheadedness and dizziness. No orthostatic hypotension today. Suspect episodes of lightheadedness and dizziness, likely in the setting of IDA. - Stop Amlodipine and Carvedilol - Monitor BP - Reassess need for antihypertensive medications while hospitalized   Patient seen with Dr. Evette Doffing

## 2022-06-24 ENCOUNTER — Observation Stay (HOSPITAL_COMMUNITY): Payer: Medicare Other

## 2022-06-24 ENCOUNTER — Encounter (HOSPITAL_COMMUNITY): Payer: Self-pay | Admitting: Internal Medicine

## 2022-06-24 ENCOUNTER — Other Ambulatory Visit: Payer: Self-pay

## 2022-06-24 DIAGNOSIS — K922 Gastrointestinal hemorrhage, unspecified: Secondary | ICD-10-CM | POA: Diagnosis present

## 2022-06-24 DIAGNOSIS — G809 Cerebral palsy, unspecified: Secondary | ICD-10-CM | POA: Diagnosis present

## 2022-06-24 DIAGNOSIS — Z90710 Acquired absence of both cervix and uterus: Secondary | ICD-10-CM | POA: Diagnosis not present

## 2022-06-24 DIAGNOSIS — Z8679 Personal history of other diseases of the circulatory system: Secondary | ICD-10-CM | POA: Diagnosis not present

## 2022-06-24 DIAGNOSIS — Z91041 Radiographic dye allergy status: Secondary | ICD-10-CM | POA: Diagnosis not present

## 2022-06-24 DIAGNOSIS — Z531 Procedure and treatment not carried out because of patient's decision for reasons of belief and group pressure: Secondary | ICD-10-CM | POA: Diagnosis present

## 2022-06-24 DIAGNOSIS — Z794 Long term (current) use of insulin: Secondary | ICD-10-CM | POA: Diagnosis not present

## 2022-06-24 DIAGNOSIS — Z882 Allergy status to sulfonamides status: Secondary | ICD-10-CM | POA: Diagnosis not present

## 2022-06-24 DIAGNOSIS — D500 Iron deficiency anemia secondary to blood loss (chronic): Principal | ICD-10-CM

## 2022-06-24 DIAGNOSIS — Z833 Family history of diabetes mellitus: Secondary | ICD-10-CM | POA: Diagnosis not present

## 2022-06-24 DIAGNOSIS — E119 Type 2 diabetes mellitus without complications: Secondary | ICD-10-CM | POA: Diagnosis present

## 2022-06-24 DIAGNOSIS — D649 Anemia, unspecified: Secondary | ICD-10-CM | POA: Diagnosis present

## 2022-06-24 DIAGNOSIS — Z7984 Long term (current) use of oral hypoglycemic drugs: Secondary | ICD-10-CM | POA: Diagnosis not present

## 2022-06-24 DIAGNOSIS — Z79899 Other long term (current) drug therapy: Secondary | ICD-10-CM | POA: Diagnosis not present

## 2022-06-24 DIAGNOSIS — Z8249 Family history of ischemic heart disease and other diseases of the circulatory system: Secondary | ICD-10-CM | POA: Diagnosis not present

## 2022-06-24 DIAGNOSIS — F32A Depression, unspecified: Secondary | ICD-10-CM | POA: Diagnosis present

## 2022-06-24 DIAGNOSIS — R55 Syncope and collapse: Secondary | ICD-10-CM | POA: Diagnosis present

## 2022-06-24 DIAGNOSIS — R197 Diarrhea, unspecified: Secondary | ICD-10-CM | POA: Diagnosis present

## 2022-06-24 DIAGNOSIS — Z888 Allergy status to other drugs, medicaments and biological substances status: Secondary | ICD-10-CM | POA: Diagnosis not present

## 2022-06-24 DIAGNOSIS — F259 Schizoaffective disorder, unspecified: Secondary | ICD-10-CM | POA: Diagnosis present

## 2022-06-24 DIAGNOSIS — E785 Hyperlipidemia, unspecified: Secondary | ICD-10-CM | POA: Diagnosis present

## 2022-06-24 DIAGNOSIS — I10 Essential (primary) hypertension: Secondary | ICD-10-CM | POA: Diagnosis present

## 2022-06-24 DIAGNOSIS — K219 Gastro-esophageal reflux disease without esophagitis: Secondary | ICD-10-CM | POA: Diagnosis present

## 2022-06-24 DIAGNOSIS — G4733 Obstructive sleep apnea (adult) (pediatric): Secondary | ICD-10-CM | POA: Diagnosis present

## 2022-06-24 DIAGNOSIS — E876 Hypokalemia: Secondary | ICD-10-CM | POA: Diagnosis present

## 2022-06-24 LAB — CBC
HCT: 18.8 % — ABNORMAL LOW (ref 36.0–46.0)
Hemoglobin: 5.6 g/dL — CL (ref 12.0–15.0)
MCH: 19.3 pg — ABNORMAL LOW (ref 26.0–34.0)
MCHC: 29.8 g/dL — ABNORMAL LOW (ref 30.0–36.0)
MCV: 64.8 fL — ABNORMAL LOW (ref 80.0–100.0)
Platelets: 221 10*3/uL (ref 150–400)
RBC: 2.9 MIL/uL — ABNORMAL LOW (ref 3.87–5.11)
RDW: 19.5 % — ABNORMAL HIGH (ref 11.5–15.5)
WBC: 4.9 10*3/uL (ref 4.0–10.5)
nRBC: 0 % (ref 0.0–0.2)

## 2022-06-24 LAB — ECHOCARDIOGRAM COMPLETE
AR max vel: 2.52 cm2
AV Area VTI: 2.45 cm2
AV Area mean vel: 2.38 cm2
AV Mean grad: 4 mmHg
AV Peak grad: 7 mmHg
Ao pk vel: 1.32 m/s
Area-P 1/2: 2.03 cm2
Height: 64 in
S' Lateral: 3.4 cm
Weight: 3421.54 oz

## 2022-06-24 LAB — BASIC METABOLIC PANEL
Anion gap: 5 (ref 5–15)
BUN: <5 mg/dL — ABNORMAL LOW (ref 6–20)
CO2: 26 mmol/L (ref 22–32)
Calcium: 8.7 mg/dL — ABNORMAL LOW (ref 8.9–10.3)
Chloride: 107 mmol/L (ref 98–111)
Creatinine, Ser: 0.58 mg/dL (ref 0.44–1.00)
GFR, Estimated: >60 mL/min (ref >60)
Glucose, Bld: 104 mg/dL — ABNORMAL HIGH (ref 70–99)
Potassium: 3.5 mmol/L (ref 3.5–5.1)
Sodium: 138 mmol/L (ref 135–145)

## 2022-06-24 LAB — RETIC PANEL
Immature Retic Fract: 8.9 % (ref 2.3–15.9)
RBC.: 2.94 MIL/uL — ABNORMAL LOW (ref 3.87–5.11)
Retic Count, Absolute: 25.9 10*3/uL (ref 19.0–186.0)
Retic Ct Pct: 0.9 % (ref 0.4–3.1)
Reticulocyte Hemoglobin: 15.5 pg — ABNORMAL LOW (ref >27.9)

## 2022-06-24 LAB — GLUCOSE, CAPILLARY
Glucose-Capillary: 123 mg/dL — ABNORMAL HIGH (ref 70–99)
Glucose-Capillary: 126 mg/dL — ABNORMAL HIGH (ref 70–99)
Glucose-Capillary: 109 mg/dL — ABNORMAL HIGH (ref 70–99)
Glucose-Capillary: 145 mg/dL — ABNORMAL HIGH (ref 70–99)

## 2022-06-24 LAB — MAGNESIUM: Magnesium: 1.5 mg/dL — ABNORMAL LOW (ref 1.7–2.4)

## 2022-06-24 MED ORDER — ACETAMINOPHEN 325 MG PO TABS
650.0000 mg | ORAL_TABLET | Freq: Four times a day (QID) | ORAL | Status: DC | PRN
  Administered 2022-06-24: 650 mg via ORAL
  Filled 2022-06-24: qty 2

## 2022-06-24 NOTE — Progress Notes (Signed)
Subjective:   Summary: [name] is a 51 y.o. year old female currently admitted on the IMTS HD#0 for syncope secondary to iron deficiency anemia.  Overnight Events: NOE   Pt was seen at bedside this AM. She endorses feeling dizzy, and having some stomach pain. She has urinated, but denies any dysuria or blood in urine. She denies any weight loss, instead endorses weight gain. Denies headaches, chest pain (outside of her reflux), LE swelling.   Objective:  Vital signs in last 24 hours: Vitals:   06/24/22 0033 06/24/22 0148 06/24/22 0407 06/24/22 0918  BP: 120/72  128/82 122/80  Pulse: 90 89 94 98  Resp: '16 16 15 18  '$ Temp: 99 F (37.2 C)  98 F (36.7 C) 98.1 F (36.7 C)  TempSrc: Oral  Oral Oral  SpO2: 100% 100% 99% 97%  Weight:   97 kg   Height:   '5\' 4"'$  (1.626 m)    Supplemental O2: Room Air SpO2: 97 %   Physical Exam:  Constitutional: well-appearing woman laying down in bed Cardiovascular: RRR, no murmurs, rubs or gallops Pulmonary/Chest: normal work of breathing on room air, lungs clear to auscultation bilaterally Abdominal: soft, non-tender, non-distended Skin: warm and dry Extremities: upper/lower extremity pulses 2+, no lower extremity edema present  Filed Weights   06/24/22 0407  Weight: 97 kg     Intake/Output Summary (Last 24 hours) at 06/24/2022 1336 Last data filed at 06/24/2022 0600 Gross per 24 hour  Intake 990 ml  Output 0 ml  Net 990 ml   Net IO Since Admission: 990 mL [06/24/22 1336]  Pertinent Labs:    Latest Ref Rng & Units 06/24/2022    4:19 AM 06/23/2022    9:04 AM 06/15/2022    4:31 PM  CBC  WBC 4.0 - 10.5 K/uL 4.9  6.5  4.6   Hemoglobin 12.0 - 15.0 g/dL 5.6  6.4  6.9   Hematocrit 36.0 - 46.0 % 18.8  22.1  22.9   Platelets 150 - 400 K/uL 221  259  378        Latest Ref Rng & Units 06/24/2022    4:19 AM 06/23/2022    9:04 AM 06/15/2022    4:31 PM  CMP  Glucose 70 - 99 mg/dL 104  110  94   BUN 6 - 20 mg/dL <5  <5   3   Creatinine 0.44 - 1.00 mg/dL 0.58  0.54  0.56   Sodium 135 - 145 mmol/L 138  136  142   Potassium 3.5 - 5.1 mmol/L 3.5  3.4  3.5   Chloride 98 - 111 mmol/L 107  103  100   CO2 22 - 32 mmol/L '26  27  23   '$ Calcium 8.9 - 10.3 mg/dL 8.7  8.9  9.1   Total Protein 6.5 - 8.1 g/dL  6.5  6.5   Total Bilirubin 0.3 - 1.2 mg/dL  0.4  <0.2   Alkaline Phos 38 - 126 U/L  53  64   AST 15 - 41 U/L  13  10   ALT 0 - 44 U/L  12  7    Imaging: ECHOCARDIOGRAM COMPLETE  Result Date: 06/24/2022    ECHOCARDIOGRAM REPORT   Patient Name:   [name] Date of Exam: 06/24/2022 Medical Rec #:  [id number]       Height:       64.0  in Accession #:    [id number]      Weight:       213.8 lb Date of Birth:  [birth year]        BSA:          2.013 m Patient Age:    16 years        BP:           122/80 mmHg Patient Gender: F               HR:           90 bpm. Exam Location:  Inpatient Procedure: 2D Echo, Cardiac Doppler and Color Doppler Indications:    Syncope  History:        Patient has prior history of Echocardiogram examinations, most                 recent 12/28/2016. Signs/Symptoms:Dyspnea; Risk Factors:Diabetes,                 Dyslipidemia and Sleep Apnea.  Sonographer:    Clayton Lefort RDCS (AE) Referring Phys: [facility]  Sonographer Comments: Technically difficult study due to poor echo windows. IMPRESSIONS  1. Left ventricular ejection fraction, by estimation, is 55%. The left ventricle has normal function. The left ventricle has no regional wall motion abnormalities. Left ventricular diastolic parameters are indeterminate.  2. Right ventricular systolic function is normal. The right ventricular size is normal. There is normal pulmonary artery systolic pressure. The estimated right ventricular systolic pressure is 53.6 mmHg.  3. The mitral valve is normal in structure. Mild mitral valve regurgitation. No evidence of mitral stenosis.  4. The aortic valve is grossly normal. Aortic valve regurgitation is not  visualized. No aortic stenosis is present.  5. The inferior vena cava is dilated in size with <50% respiratory variability, suggesting right atrial pressure of 15 mmHg. FINDINGS  Left Ventricle: Left ventricular ejection fraction, by estimation, is 55%. The left ventricle has normal function. The left ventricle has no regional wall motion abnormalities. The left ventricular internal cavity size was normal in size. There is no left ventricular hypertrophy. Left ventricular diastolic parameters are indeterminate. Right Ventricle: The right ventricular size is normal. No increase in right ventricular wall thickness. Right ventricular systolic function is normal. There is normal pulmonary artery systolic pressure. The tricuspid regurgitant velocity is 2.27 m/s, and  with an assumed right atrial pressure of 15 mmHg, the estimated right ventricular systolic pressure is 14.4 mmHg. Left Atrium: Left atrial size was normal in size. Right Atrium: Right atrial size was normal in size. Pericardium: There is no evidence of pericardial effusion. Mitral Valve: The mitral valve is normal in structure. Mild mitral valve regurgitation. No evidence of mitral valve stenosis. Tricuspid Valve: The tricuspid valve is normal in structure. Tricuspid valve regurgitation is mild . No evidence of tricuspid stenosis. Aortic Valve: The aortic valve is grossly normal. Aortic valve regurgitation is not visualized. No aortic stenosis is present. Aortic valve mean gradient measures 4.0 mmHg. Aortic valve peak gradient measures 7.0 mmHg. Aortic valve area, by VTI measures 2.45 cm. Pulmonic Valve: The pulmonic valve was normal in structure. Pulmonic valve regurgitation is trivial. No evidence of pulmonic stenosis. Aorta: The aortic root is normal in size and structure. Venous: The inferior vena cava is dilated in size with less than 50% respiratory variability, suggesting right atrial pressure of 15 mmHg. IAS/Shunts: No atrial level shunt detected by  color flow Doppler.  LEFT VENTRICLE PLAX 2D LVIDd:  4.90 cm   Diastology LVIDs:         3.40 cm   LV e' medial:    7.94 cm/s LV PW:         1.00 cm   LV E/e' medial:  13.0 LV IVS:        0.90 cm   LV e' lateral:   12.00 cm/s LVOT diam:     2.10 cm   LV E/e' lateral: 8.6 LV SV:         55 LV SV Index:   27 LVOT Area:     3.46 cm  RIGHT VENTRICLE             IVC RV Basal diam:  2.50 cm     IVC diam: 2.10 cm RV S prime:     13.40 cm/s TAPSE (M-mode): 2.2 cm LEFT ATRIUM             Index        RIGHT ATRIUM           Index LA diam:        3.10 cm 1.54 cm/m   RA Area:     13.20 cm LA Vol (A2C):   49.2 ml 24.44 ml/m  RA Volume:   30.60 ml  15.20 ml/m LA Vol (A4C):   46.5 ml 23.10 ml/m LA Biplane Vol: 52.8 ml 26.23 ml/m  AORTIC VALVE AV Area (Vmax):    2.52 cm AV Area (Vmean):   2.38 cm AV Area (VTI):     2.45 cm AV Vmax:           132.00 cm/s AV Vmean:          90.600 cm/s AV VTI:            0.225 m AV Peak Grad:      7.0 mmHg AV Mean Grad:      4.0 mmHg LVOT Vmax:         95.90 cm/s LVOT Vmean:        62.200 cm/s LVOT VTI:          0.159 m LVOT/AV VTI ratio: 0.71  AORTA Ao Root diam: 2.90 cm Ao Asc diam:  3.20 cm MITRAL VALVE                TRICUSPID VALVE MV Area (PHT): 2.03 cm     TR Peak grad:   20.6 mmHg MV Decel Time: 374 msec     TR Vmax:        227.00 cm/s MV E velocity: 103.00 cm/s MV A velocity: 117.00 cm/s  SHUNTS MV E/A ratio:  0.88         Systemic VTI:  0.16 m                             Systemic Diam: 2.10 cm Cherlynn Kaiser MD Electronically signed by Cherlynn Kaiser MD Signature Date/Time: 06/24/2022/12:49:26 PM    Final    DG CHEST PORT 1 VIEW  Result Date: 06/23/2022 CLINICAL DATA:  Dizziness, dyspnea EXAM: PORTABLE CHEST 1 VIEW COMPARISON:  10/06/2020 FINDINGS: Single frontal view of the chest demonstrates an unremarkable cardiac silhouette. No airspace disease, effusion, or pneumothorax. No acute bony abnormalities. IMPRESSION: 1. No acute intrathoracic process. Electronically  Signed   By: Randa Ngo M.D.   On: 06/23/2022 19:21     Assessment/Plan:   Principal Problem:   Symptomatic anemia Active Problems:  Iron deficiency anemia due to chronic blood loss   Blood transfusion declined because patient is Jehovah's Witness   Patient Summary: [name] is a 51 y.o. with a pertinent PMH of Type 2 Diabetes Mellitus, hyperlipidemia, hypertension ,obstructive sleep apnea, and schizoaffective disorder  who presented with dyspnea and fatigue, and admitted for symptomatic anemia.    #Symptomatic Anemia #Iron Deficiency Anemia  Pt states that she is still feeling light-headed today. She is currently hemodynamically stable. Her Hb has dropped from 6.9 on 8/1 to 5.6 on the most recent lab work at [facility]. She has no signs or evidence of bleeding on physical exam. She does note that her stools have been stickier and darker recently, which could be a sign of GI bleeding. She had a hysterectomy in 2016 and hasn't had a period since. Denies any bloody discharge. 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   Plan:  - Minimize amount of blood drawn, Hemosiderin and Hemoglobin are the only labs we will be ordering. and the minimum amount of volume to get an effective measurement according to [facility] is 1 mL in a Lavender EDTA tube.  - Currently on IV Ferrlicet 850 mg daily - Will continue to monitor Hemoglobin and Hemosiderin -Outpatient GI and colonoscopy appointment    Diet: Normal IVF: None,None VTE: SCDs Code: Full    Dispo: Anticipated discharge in less than 2 midnights.   Drucie Opitz, MD PGY-1 Internal Medicine Resident Pager Number [phone] Please contact the on call pager after 5 pm and on weekends at [phone].

## 2022-06-24 NOTE — TOC Progression Note (Signed)
Transition of Care [facility]) - Initial/Assessment Note    Patient Details  Name: [name] MRN: [id number] Date of Birth: [birth year]  [facility] CM/SW Contact:    Milinda Antis, [place] Phone Number: 06/24/2022, 10:40 AM  Clinical Narrative:                 TOC following patient for any d/c planning needs once medically stable.  Lind Covert, MSW, LCSWA     Patient Goals and CMS Choice        Expected Discharge Plan and Services                                                Prior Living Arrangements/Services                       Activities of Daily Living Home Assistive Devices/Equipment: Scales ADL Screening (condition at time of admission) Patient's cognitive ability adequate to safely complete daily activities?: Yes Is the patient deaf or have difficulty hearing?: Yes Does the patient have difficulty seeing, even when wearing glasses/contacts?: No Does the patient have difficulty concentrating, remembering, or making decisions?: No Patient able to express need for assistance with ADLs?: Yes Does the patient have difficulty dressing or bathing?: No Independently performs ADLs?: Yes (appropriate for developmental age) Does the patient have difficulty walking or climbing stairs?: Yes Weakness of Legs: Both Weakness of Arms/Hands: None  Permission Sought/Granted                  Emotional Assessment              Admission diagnosis:  Symptomatic anemia [D64.9] Patient Active Problem List   Diagnosis Date Noted   Symptomatic anemia 06/23/2022   Rhinitis, nonallergic 10/16/2021   Sore throat 10/13/2021   Need for pneumococcal vaccination 02/19/2021   Hypertension 03/21/2018   Allergic rhinitis 01/25/2018   OSA (obstructive sleep apnea) 06/10/2017   Congenital hearing disorder 12/08/2016   Schizoaffective disorder, chronic condition ([place]) 06/18/2016   Microcytic anemia 03/04/2015   Hyperlipidemia LDL goal <70  09/10/2014   Obesity (BMI 30-39.9) 07/30/2014   Pap smear for cervical cancer screening 07/30/2014   Type 2 diabetes mellitus ([place]) 11/07/2012   PCP:  Nani Gasser, MD Pharmacy:   [facility], [address] [place] [address] Phone: [phone] Fax: [phone]  [facility] [place] [address] [place]) [place] [address] Phone: [phone] Fax: [phone]     Social Determinants of Health (SDOH) Interventions    Readmission Risk Interventions     No data to display

## 2022-06-24 NOTE — Progress Notes (Signed)
Internal Medicine Clinic Attending  I saw and evaluated the patient.  I personally confirmed the key portions of the history and exam documented by Dr. Simeon Craft and I reviewed pertinent patient test results.  The assessment, diagnosis, and plan were formulated together and I agree with the documentation in the resident's note.   Person with a severe symptomatic iron deficiency anemia. Patient declines blood product transfusions, consistent with her religious beliefs. There were delays in arranging for outpatient IV iron infusion last week and her symptoms have progressed to the point of impacting safety at home. She has limited support at home, limited transportation as well. We will arrange for direct admission for the goal of giving daily IV iron. Once iron stores are more replete, can give EPO product which has been shown to hasten symptom resolution in this population. Hopefully those interventions will improve her functional status enough. Will limit phlebotomy draws. Eventually she will warrant outpatient endoscopy to look for the source of the slow blood loss.

## 2022-06-24 NOTE — Hospital Course (Addendum)
8/10 Patient seen at bedside, resting comfortably. Patient reports feeling light headed and having some stomach pain. Has urinated, has not noted any bleeding. Pt denies weight loss, notes weight gain. Denies HA, only notes light headedness when standing. Denies chest pain in epigastric region believes it to be GERD. Denies any LE swelling, only when she fell. Denies any recent falls. Patient's status and plan was discussed and amenable to current plan/workup.  Patient was seen by Dr. Altamease Oiler in the clinic.  Denies vaginal bleeding, but reports a foul odor following C-section in the past couple of weeks. Pt was unsure if it smelled like blood, discharge appeared yellow. This is the only time this has occurred in the past.   Pt reports loose, sticky stools colored [place] since last sunday (7/30). Denies hemoptysis. No prolonged bleeding on any recent cuts.  PE: Cards: normal RR Resp: clear on asucultation  Abd: normal --------------------------------------------------------- 8/11: Patient states that she experienced dizziness last night but has not experienced any dizziness this morning. Patient states that she is feeling well today. Patient states that she had a hard bowel movement today that was dark brow. Discussed plan for obtaining orthostatics today. Discussed plan to begin Epo today. Discussed that patient will require a colonoscopy in the future to determine if she has a GI bleed.   PE: normal heart/lungs, normal bowel sounds, no abdominal tenderness, no LE swelling   ----------------------------------------------------------------------------------------- 8/12 Patient appears resting comfortably at bedside. Reports lightheadedness yesterday. No lightheadedness today, denies chest pain. Is willing to ambulate today on the unit. Sister will come visit today. Discussed discharged  PE: normal.

## 2022-06-24 NOTE — Progress Notes (Signed)
  Echocardiogram 2D Echocardiogram has been performed.  [name] 06/24/2022, 11:56 AM

## 2022-06-25 ENCOUNTER — Inpatient Hospital Stay (HOSPITAL_COMMUNITY): Admission: RE | Admit: 2022-06-25 | Payer: Medicare Other | Source: Ambulatory Visit

## 2022-06-25 DIAGNOSIS — Z531 Procedure and treatment not carried out because of patient's decision for reasons of belief and group pressure: Secondary | ICD-10-CM | POA: Diagnosis not present

## 2022-06-25 DIAGNOSIS — D500 Iron deficiency anemia secondary to blood loss (chronic): Secondary | ICD-10-CM | POA: Diagnosis not present

## 2022-06-25 LAB — HEMOGLOBIN AND HEMATOCRIT, BLOOD
HCT: 19.1 % — ABNORMAL LOW (ref 36.0–46.0)
Hemoglobin: 5.7 g/dL — CL (ref 12.0–15.0)

## 2022-06-25 LAB — GLUCOSE, CAPILLARY
Glucose-Capillary: 98 mg/dL (ref 70–99)
Glucose-Capillary: 123 mg/dL — ABNORMAL HIGH (ref 70–99)
Glucose-Capillary: 162 mg/dL — ABNORMAL HIGH (ref 70–99)
Glucose-Capillary: 94 mg/dL (ref 70–99)

## 2022-06-25 MED ORDER — DARBEPOETIN ALFA 40 MCG/0.4ML IJ SOSY
40.0000 ug | PREFILLED_SYRINGE | INTRAMUSCULAR | Status: DC
  Administered 2022-06-25: 40 ug via SUBCUTANEOUS
  Filled 2022-06-25: qty 0.4

## 2022-06-25 NOTE — Plan of Care (Signed)
  Problem: Education: Goal: Knowledge of General Education information will improve Description Including pain rating scale, medication(s)/side effects and non-pharmacologic comfort measures Outcome: Progressing   

## 2022-06-25 NOTE — Progress Notes (Signed)
MEDICATION RELATED CONSULT NOTE - INITIAL   Pharmacy Consult for Aranesp Indication: anemia  Allergies  Allergen Reactions   Bactrim [Sulfamethoxazole-Trimethoprim] Anaphylaxis, Shortness Of Breath and Swelling   Lisinopril Cough   Losartan Cough   Bee Pollen Other (See Comments)    Seasonal allergies   Ivp Dye [Iodinated Contrast Media] Nausea And Vomiting   Metrizamide Nausea And Vomiting   Pollen Extract     Seasonal allergies   Sulfa Antibiotics Swelling    Eyes and lips swelled up   Sulfasalazine Swelling    Eyes and lips swelled up   Other Rash   Peanut-Containing Drug Products Rash    Patient Measurements: Height: '5\' 4"'$  (162.6 cm) Weight: 97 kg (213 lb 13.5 oz) IBW/kg (Calculated) : 54.7   Vital Signs: BP: 109/73 (08/11 1006) Pulse Rate: 88 (08/11 1006) Intake/Output from previous day: 08/10 0701 - 08/11 0700 In: 990 [P.O.:720; IV Piggyback:270] Out: 0  Intake/Output from this shift: Total I/O In: 300 [P.O.:300] Out: -   Labs: Recent Labs    06/23/22 0904 06/24/22 0419 06/25/22 0402  WBC 6.5 4.9  --   HGB 6.4* 5.6* 5.7*  HCT 22.1* 18.8* 19.1*  PLT 259 221  --   CREATININE 0.54 0.58  --   MG  --  1.5*  --   ALBUMIN 3.5  --   --   PROT 6.5  --   --   AST 13*  --   --   ALT 12  --   --   ALKPHOS 53  --   --   BILITOT 0.4  --   --    Estimated Creatinine Clearance: 97.2 mL/min (by C-G formula based on SCr of 0.58 mg/dL).   Microbiology: No results found for this or any previous visit (from the past 720 hour(s)).  Medical History: Past Medical History:  Diagnosis Date   Anemia    Congenital cerebral palsy (HCC)    Depression    Diabetes mellitus due to abnormal insulin ([place])    Edema 06/10/2017   Endometriosis    Hearing impairment    Heart murmur    at birth   Hypertension    Neuromuscular disorder ([place])    Cerebral palsy- very mild left side of brain   Schizoaffective disorder (HCC)    Thyroid nodule 07/30/2014   CT scan from  08/27/2010: A 1.9 cm calcified nodule involving the lower pole of the right lobe of the thyroid gland. US Neck 11/06/14: Bilateral nodules. Dominant right lower pole nodule measures 2.3 cm. Findings meet consensus criteria for biopsy. Ultrasound-guided fine needle aspiration should be considered Aspiration pathology: benign follicular nodule    Medications:  Medications Prior to Admission  Medication Sig Dispense Refill Last Dose   amLODipine (NORVASC) 5 MG tablet TAKE 1 TABLET (5 MG TOTAL) BY MOUTH DAILY. (Patient taking differently: Take 5 mg by mouth daily.) 90 tablet 3 06/23/2022   atorvastatin (LIPITOR) 40 MG tablet Take 40 mg by mouth daily.   06/23/2022   benzonatate (TESSALON PERLES) 100 MG capsule Take 1 capsule (100 mg total) by mouth 3 (three) times daily as needed for cough. 30 capsule 1 Past Month   carvedilol (COREG) 6.25 MG tablet TAKE ONE (1) TABLET BY MOUTH TWO (2) TIMES DAILY WITH A MEAL (Patient taking differently: Take 6.25 mg by mouth 2 (two) times daily with a meal.) 120 tablet 3 06/23/2022 at 0800   cetirizine (ZYRTEC ALLERGY) 10 MG tablet Take 1 tablet (10 mg total)  by mouth daily. 30 tablet 2 Past Week   DULoxetine (CYMBALTA) 30 MG capsule Take 30 mg by mouth every evening.   06/22/2022   fluticasone (FLONASE) 50 MCG/ACT nasal spray Place 1 spray into both nostrils daily. (Patient taking differently: Place 1 spray into both nostrils daily as needed for allergies.) 9.9 mL 0 Past Week   liraglutide (VICTOZA) 18 MG/3ML SOPN INJECT 1.'8MG'$  INTO SKIN ONCE WEEKLY AS DIRECTED (Patient taking differently: Inject 1.8 mg into the skin once a week.) 9 mL 5 Past Week   metFORMIN (GLUCOPHAGE) 1000 MG tablet TAKE 1 TABLET BY MOUTH 2 TIMES DAILY (Patient taking differently: Take 1,000 mg by mouth 2 (two) times daily.) 180 tablet 3 06/23/2022   oxymetazoline (AFRIN NASAL SPRAY) 0.05 % nasal spray Place 1 spray into both nostrils 2 (two) times daily. 30 mL 0 Past Week   paliperidone (INVEGA SUSTENNA) 156  MG/ML SUSP injection Inject 156 mg into the muscle every 30 (thirty) days.    Past Week   atorvastatin (LIPITOR) 20 MG tablet Take 2 tablets (40 mg total) by mouth daily. (Patient not taking: Reported on 06/23/2022) 90 tablet 3 Not Taking   benztropine (COGENTIN) 0.5 MG tablet Take 0.5 mg by mouth 2 (two) times daily. MORNING (0900) AND NIGHT (2100) (Patient not taking: Reported on 06/23/2022)   Not Taking   glucose blood (ACCU-CHEK GUIDE) test strip Check blood sugar one times a day as instructed 100 each 3    Insulin Pen Needle 32G X 4 MM MISC USE TO INJECT INSULIN ONCE DAILY. DIAGNOSIS CODE E11.21 100 each 1    Scheduled:   atorvastatin  40 mg Oral Daily   benztropine  0.5 mg Oral BID   DULoxetine  30 mg Oral Daily   insulin aspart  0-15 Units Subcutaneous TID WC   pantoprazole  40 mg Oral Daily    Assessment: 51 yo female with iron deficiency anemia. She is also a Jehov[employer]'s Witness and does not want any blood transfusions. She is on IV iron.  -Hg= 5.7 -iron= 9, sat= 2%, ferritin= 6 -wt= 97kg  Goal of Therapy:  Hg  14-15  Plan:  -35mg aranesp once weekly -CBC at least weekly for now  AHildred Laser PharmD Clinical Pharmacist **Pharmacist phone directory can now be found on [email or web address] ([email or web address]).  Listed under [place]

## 2022-06-25 NOTE — Progress Notes (Signed)
Subjective:   Summary: [name] is a 51 y.o. year old female currently admitted on the IMTS HD#1 for syncope, dyspnea, and fatigue secondary to iron deficiency anemia.  Overnight Events: NOE   Pt was seen at bedside this AM. She says she is feeling much better, and does not endorse any lightheadedness or dizziness. She stated she had a bowel movement today that was dark [place] and clumped together. Discussed plan to begin Epo today, and patient is agreeable. I attempted to contact patient's sister however she did not pick up.  Objective:  Vital signs in last 24 hours: Vitals:   06/24/22 0918 06/24/22 1655 06/24/22 2110 06/25/22 1006  BP: 122/80 125/81 106/65 109/73  Pulse: 98 91 96 88  Resp: '18 18 18 16  '$ Temp: 98.1 F (36.7 C) 98.7 F (37.1 C) 98.4 F (36.9 C)   TempSrc: Oral Oral Oral   SpO2: 97% 100% 100% 100%  Weight:      Height:       Supplemental O2: Room Air SpO2: 100 %   Physical Exam:  Constitutional: well-appearing woman laying down in bed  Cardiovascular: RRR, no murmurs, rubs or gallops Pulmonary/Chest: normal work of breathing on room air, lungs clear to auscultation bilaterally Abdominal: soft, non-tender, non-distended, normal bowel sounds Skin: warm and dry Extremities: upper/lower extremity pulses 2+, no lower extremity edema present  [facility] Weights   06/24/22 0407  Weight: 97 kg     Intake/Output Summary (Last 24 hours) at 06/25/2022 1308 Last data filed at 06/25/2022 0951 Gross per 24 hour  Intake 930 ml  Output 0 ml  Net 930 ml   Net IO Since Admission: 2,280 mL [06/25/22 1308]  Pertinent Labs:    Latest Ref Rng & Units 06/25/2022    4:02 AM 06/24/2022    4:19 AM 06/23/2022    9:04 AM  CBC  WBC 4.0 - 10.5 K/uL  4.9  6.5   Hemoglobin 12.0 - 15.0 g/dL 5.7  5.6  6.4   Hematocrit 36.0 - 46.0 % 19.1  18.8  22.1   Platelets 150 - 400 K/uL  221  259        Latest Ref Rng & Units 06/24/2022    4:19 AM 06/23/2022    9:04  AM 06/15/2022    4:31 PM  CMP  Glucose 70 - 99 mg/dL 104  110  94   BUN 6 - 20 mg/dL <5  <5  3   Creatinine 0.44 - 1.00 mg/dL 0.58  0.54  0.56   Sodium 135 - 145 mmol/L 138  136  142   Potassium 3.5 - 5.1 mmol/L 3.5  3.4  3.5   Chloride 98 - 111 mmol/L 107  103  100   CO2 22 - 32 mmol/L '26  27  23   '$ Calcium 8.9 - 10.3 mg/dL 8.7  8.9  9.1   Total Protein 6.5 - 8.1 g/dL  6.5  6.5   Total Bilirubin 0.3 - 1.2 mg/dL  0.4  <0.2   Alkaline Phos 38 - 126 U/L  53  64   AST 15 - 41 U/L  13  10   ALT 0 - 44 U/L  12  7      Assessment/Plan:   Principal Problem:   Symptomatic anemia Active Problems:   Iron deficiency anemia due to chronic blood loss   Blood transfusion declined because patient  is Jehovah's Witness   Patient Summary: [name] is a 51 y.o. with a pertinent PMH of Type 2 Diabetes Mellitus, hyperlipidemia, hypertension, obstructive sleep apnea, and schizoaffective disorder, who presented with dyspnea and fatigue and admitted for symptomatic anemia.    #Symptomatic Anemia  #Iron Deficiency Anemia  Pt states that her light-headedness has disappeared today. She is hemodynamically stable. Her Hb has increased slightly from 5.6 yesterday, to 5.7 today. She says she had a bowel movement today, and described it as clumped together and dark.   Plan:  - Continue to minimize amount of blood drawn by only checking Hematocrit and Hemoglobin  - Currently on IV Ferrlicet 619 mg daily  - Starting Aranesp 40 mcg (EPO dosed by pharmacy)  - Orthostatic blood pressure to assess lightheadedness    Diet: Normal IVF: None,None VTE: None Code: Full    Dispo: Anticipated discharge in more than 2 midnights  Drucie Opitz, MD PGY-1 Internal Medicine Resident Pager Number [phone] Please contact the on call pager after 5 pm and on weekends at [phone].

## 2022-06-26 DIAGNOSIS — Z531 Procedure and treatment not carried out because of patient's decision for reasons of belief and group pressure: Secondary | ICD-10-CM | POA: Diagnosis not present

## 2022-06-26 DIAGNOSIS — D500 Iron deficiency anemia secondary to blood loss (chronic): Secondary | ICD-10-CM | POA: Diagnosis not present

## 2022-06-26 LAB — HEMOGLOBIN AND HEMATOCRIT, BLOOD
HCT: 20.8 % — ABNORMAL LOW (ref 36.0–46.0)
Hemoglobin: 6 g/dL — CL (ref 12.0–15.0)

## 2022-06-26 LAB — GLUCOSE, CAPILLARY
Glucose-Capillary: 131 mg/dL — ABNORMAL HIGH (ref 70–99)
Glucose-Capillary: 102 mg/dL — ABNORMAL HIGH (ref 70–99)

## 2022-06-26 MED ORDER — ATORVASTATIN CALCIUM 40 MG PO TABS
40.0000 mg | ORAL_TABLET | Freq: Every day | ORAL | 0 refills | Status: DC
  Filled 2022-06-26: qty 30, 30d supply, fill #0

## 2022-06-26 MED ORDER — ATORVASTATIN CALCIUM 40 MG PO TABS: 40.0000 mg | ORAL_TABLET | Freq: Every day | ORAL | 0 refills | Status: DC

## 2022-06-26 MED ORDER — DULOXETINE HCL 30 MG PO CPEP: 30.0000 mg | ORAL_CAPSULE | Freq: Every day | ORAL | 3 refills | Status: DC

## 2022-06-26 MED ORDER — FERROUS SULFATE 325 (65 FE) MG PO TBEC: 325.0000 mg | DELAYED_RELEASE_TABLET | ORAL | 0 refills | Status: DC

## 2022-06-26 MED ORDER — DULOXETINE HCL 30 MG PO CPEP
30.0000 mg | ORAL_CAPSULE | Freq: Every day | ORAL | 3 refills | Status: DC
  Filled 2022-06-26: qty 30, 30d supply, fill #0

## 2022-06-26 MED ORDER — FERROUS SULFATE 325 (65 FE) MG PO TBEC
325.0000 mg | DELAYED_RELEASE_TABLET | ORAL | 0 refills | Status: DC
  Filled 2022-06-26: qty 60, 120d supply, fill #0

## 2022-06-26 NOTE — Progress Notes (Signed)
[name] to be D/C'd Home per MD order.  Discussed with the patient and all questions fully answered.  VSS, Skin clean, dry and intact without evidence of skin break down, no evidence of skin tears noted. IV catheter discontinued intact. Site without signs and symptoms of complications. Dressing and pressure applied.  An After Visit Summary was printed and given to the patient. Patient prescriptions sent to pharmacy.  D/c education completed with patient/family including follow up instructions, medication list, d/c activities limitations if indicated, with other d/c instructions as indicated by MD - patient able to verbalize understanding, all questions fully answered.   Patient instructed to return to ED, call 911, or call MD for any changes in condition.   Patient escorted via [place], and D/C home via private auto.  [name] 06/26/2022 3:45 PM

## 2022-06-26 NOTE — Progress Notes (Signed)
RT placed CPAP on patient [name]. NO O2 bleed in needed. Patient tolerating well at this time.

## 2022-06-26 NOTE — Discharge Instructions (Signed)
You were admitted to the hospital for anemia. I am giving you some iron pills to take one EVERY OTHER DAY. The Clinic will be giving you a call to set up an appointment sometime next week to talk about your colonoscopy and a referral to see a stomach doctor.   It was a pleasure taking care of you!

## 2022-06-26 NOTE — Discharge Summary (Signed)
Name: [name] MRN: [id number] DOB: [birth year] 51 y.o. PCP: Nani Gasser, MD  Date of Admission: 06/23/2022  5:22 PM Date of Discharge: No discharge date for patient encounter. Attending Physician: Lottie Mussel, MD  Discharge Diagnosis: 1. Principal Problem:   Symptomatic anemia Active Problems:   Iron deficiency anemia due to chronic blood loss   Blood transfusion declined because patient is Jehovah's Witness    Discharge Medications: Allergies as of 06/26/2022       Reactions   Bactrim [sulfamethoxazole-trimethoprim] Anaphylaxis, Shortness Of Breath, Swelling   Lisinopril Cough   Losartan Cough   Bee Pollen Other (See Comments)   Seasonal allergies   Ivp Dye [iodinated Contrast Media] Nausea And Vomiting   Metrizamide Nausea And Vomiting   Pollen Extract    Seasonal allergies   Sulfa Antibiotics Swelling   Eyes and lips swelled up   Sulfasalazine Swelling   Eyes and lips swelled up   Other Rash   Peanut-containing Drug Products Rash        Medication List     STOP taking these medications    Accu-Chek Guide test strip Generic drug: glucose blood   benzonatate 100 MG capsule Commonly known as: Tessalon Perles   benztropine 0.5 MG tablet Commonly known as: COGENTIN   cetirizine 10 MG tablet Commonly known as: ZyrTEC Allergy   fluticasone 50 MCG/ACT nasal spray Commonly known as: Flonase   Insulin Pen Needle 32G X 4 MM Misc   oxymetazoline 0.05 % nasal spray Commonly known as: Afrin Nasal Spray   paliperidone 156 MG/ML Susp injection Commonly known as: INVEGA SUSTENNA       TAKE these medications    amLODipine 5 MG tablet Commonly known as: NORVASC TAKE 1 TABLET (5 MG TOTAL) BY MOUTH DAILY. What changed: how much to take   atorvastatin 40 MG tablet Commonly known as: LIPITOR Take 1 tablet (40 mg total) by mouth daily. Start taking on: June 27, 2022 What changed: Another medication with the same name was removed. Continue  taking this medication, and follow the directions you see here.   carvedilol 6.25 MG tablet Commonly known as: COREG TAKE ONE (1) TABLET BY MOUTH TWO (2) TIMES DAILY WITH A MEAL What changed:  how much to take how to take this when to take this additional instructions   DULoxetine 30 MG capsule Commonly known as: CYMBALTA Take 1 capsule (30 mg total) by mouth daily. Start taking on: June 27, 2022 What changed: when to take this   ferrous sulfate 325 (65 FE) MG EC tablet Take 1 tablet (325 mg total) by mouth every other day.   metFORMIN 1000 MG tablet Commonly known as: GLUCOPHAGE TAKE 1 TABLET BY MOUTH 2 TIMES DAILY What changed: how much to take   Victoza 18 MG/3ML Sopn Generic drug: liraglutide INJECT 1.'8MG'$  INTO SKIN ONCE WEEKLY AS DIRECTED What changed:  how much to take how to take this when to take this additional instructions        Disposition and follow-up:   [name] was discharged from [facility] in Good condition.  At the hospital follow up visit please address:  1.  Lightheadedness, dyspnea, abdominal pain. Will need outpatient GI appt for potential bleed in GI system, and referral for transfusion center for Iron and EPO, and age related cancer screenings.   2.  Labs / imaging needed at time of follow-up: Hb, Hematocrit. Please use the minimum amount of blood (38m) to test for these as  patient has anemia  3.  Pending labs/ test needing follow-up: N/A  Follow-up Appointments:  [facility] will contact patient to set up appointment  Hospital Course by problem list:  #Symptomatic Anemia  #Iron Deficiency Anemia  [name] is a 51 year old woman who presented to the [facility] complaining of dyspnea and fatigue. She also had a syncopal episode of 2 weeks ago on her way to a Jehov[employer]'s Witness service. Due to patient's religious beliefs, we are not able to transfuse RBC. Hbg at the time was 6.9, MCV 66, Ferritin 6, and Iron 9. She was then  admitted to the hospital, where her hemoglobin was then rechecked and showed a value of 5.6. She was then started on IV Ferrilicit 784 mg, and has received an infusion each day since she's been here. We also added Aranesp 40 mcg (EPO that was dosed by the pharmacy) to her regimen the second day she was here. Yesterday she was complaining of some light-headedness, and abdominal pain. Her hemoglobin today was 6.0, and she seems to be asymptomatic; no lightheadedness or dyspnea. She is able to tolerate going on walks around the hospital. Orthostatics were also negative yesterday.   Due to history of symptomatic anemia, it is likely that she has a bleed. She has been saying her bowel movements are dark, which points [place] in the direction of a GI bleed. She will need outpatient GI follow up to undergo screening.   Discharge Exam:   BP 121/80   Pulse 82   Temp 98.4 F (36.9 C) (Oral)   Resp 18   Ht '5\' 4"'$  (1.626 m)   Wt 97 kg   LMP 07/23/2015 (Exact Date)   SpO2 100%   BMI 36.71 kg/m  Discharge exam:   Constitutional: well-appearing woman, laying down in bed  Cardiovascular: RRR, no murmurs, rubs or gallops Pulmonary/Chest: Normal work of breathing on room air, lungs clear to auscultate bilaterally Abdominal: Soft, non-tender, non-distended, normal bowel sounds  Extremities: No edema present bilaterally  Pertinent Labs, Studies, and Procedures:     Latest Ref Rng & Units 06/26/2022    3:48 AM 06/25/2022    4:02 AM 06/24/2022    4:19 AM  CBC  WBC 4.0 - 10.5 K/uL   4.9   Hemoglobin 12.0 - 15.0 g/dL 6.0  5.7  5.6   Hematocrit 36.0 - 46.0 % 20.8  19.1  18.8   Platelets 150 - 400 K/uL   221        Latest Ref Rng & Units 06/24/2022    4:19 AM 06/23/2022    9:04 AM 06/15/2022    4:31 PM  BMP  Glucose 70 - 99 mg/dL 104  110  94   BUN 6 - 20 mg/dL <5  <5  3   Creatinine 0.44 - 1.00 mg/dL 0.58  0.54  0.56   BUN/Creat Ratio 9 - 23   5   Sodium 135 - 145 mmol/L 138  136  142   Potassium 3.5 - 5.1  mmol/L 3.5  3.4  3.5   Chloride 98 - 111 mmol/L 107  103  100   CO2 22 - 32 mmol/L '26  27  23   '$ Calcium 8.9 - 10.3 mg/dL 8.7  8.9  9.1      Discharge Instructions:  The Internal Medicine Center will give you a phone call to set up an appointment. Please take your iron pills  Discharge Instructions     Call MD for:  persistant  nausea and vomiting   Complete by: As directed    Call MD for:  severe uncontrolled pain   Complete by: As directed    Diet - low sodium heart healthy   Complete by: As directed    Increase activity slowly   Complete by: As directed        Signed: Drucie Opitz, MD 06/26/2022, 2:47 PM   Pager: [phone]

## 2022-06-26 NOTE — Progress Notes (Signed)
   06/26/22 1224  Clinical Encounter Type  Visited With Patient and family together  Visit Type Initial  Referral From Family    Chaplain was requested to assist with an advanced directive. Chaplain informed family and patient that we are not able to notarize those on the weekends, but we could notarize one on Monday when our notary returns to the hospital. Chaplain introduced spiritual care services. Spiritual care services available as needed.   Jeri Lager, Chaplain

## 2022-06-28 ENCOUNTER — Encounter (HOSPITAL_COMMUNITY): Payer: Medicare Other

## 2022-06-28 ENCOUNTER — Ambulatory Visit: Payer: Self-pay

## 2022-06-28 ENCOUNTER — Other Ambulatory Visit (HOSPITAL_COMMUNITY): Payer: Self-pay

## 2022-06-28 NOTE — Patient Outreach (Signed)
   Transition Care Management Unsuccessful Follow-up Telephone Call  Date of discharge and from where:  [name] 06/23/22-06/26/22  Attempts:  1st Attempt  Reason for unsuccessful TCM follow-up call:  Unable to reach patient

## 2022-06-28 NOTE — Progress Notes (Signed)
Internal Medicine Clinic Attending  Case discussed with Dr. Lisabeth Devoid  At the time of the visit.  We reviewed the resident's history and exam and pertinent patient test results.  I agree with the assessment, diagnosis, and plan of care documented in the resident's note. Broad differential for episodes of lightheadedness, dizziness, and what sounds like presyncope--ultimately found to have severe iron deficiency anemia as likely cause of symptoms. Plan for outpatient IV iron infusion as [name] does not accept blood transfusion due to religious beliefs.

## 2022-07-02 ENCOUNTER — Encounter (HOSPITAL_COMMUNITY): Payer: Medicare Other

## 2022-07-12 ENCOUNTER — Ambulatory Visit (INDEPENDENT_AMBULATORY_CARE_PROVIDER_SITE_OTHER): Payer: Medicare Other | Admitting: Student

## 2022-07-12 ENCOUNTER — Other Ambulatory Visit: Payer: Self-pay

## 2022-07-12 ENCOUNTER — Encounter: Payer: Self-pay | Admitting: Student

## 2022-07-12 VITALS — BP 113/69 | HR 84 | Temp 98.3°F | Resp 32 | Ht 64.0 in | Wt 210.5 lb

## 2022-07-12 DIAGNOSIS — E785 Hyperlipidemia, unspecified: Secondary | ICD-10-CM

## 2022-07-12 DIAGNOSIS — I10 Essential (primary) hypertension: Secondary | ICD-10-CM

## 2022-07-12 DIAGNOSIS — E119 Type 2 diabetes mellitus without complications: Secondary | ICD-10-CM

## 2022-07-12 DIAGNOSIS — D509 Iron deficiency anemia, unspecified: Secondary | ICD-10-CM

## 2022-07-12 DIAGNOSIS — R109 Unspecified abdominal pain: Secondary | ICD-10-CM | POA: Diagnosis present

## 2022-07-12 DIAGNOSIS — R0683 Snoring: Secondary | ICD-10-CM

## 2022-07-12 DIAGNOSIS — Z7984 Long term (current) use of oral hypoglycemic drugs: Secondary | ICD-10-CM

## 2022-07-12 DIAGNOSIS — G4733 Obstructive sleep apnea (adult) (pediatric): Secondary | ICD-10-CM

## 2022-07-12 DIAGNOSIS — D649 Anemia, unspecified: Secondary | ICD-10-CM

## 2022-07-12 NOTE — Assessment & Plan Note (Signed)
We will repeat yearly lipid panel today.  For now continue atorvastatin 40 mg daily.

## 2022-07-12 NOTE — Assessment & Plan Note (Signed)
Reviewed sleep study from 2019.  It was not diagnostic of OSA.  CPAP not recommended at that time.  STOP-BANG score of 5 indicative of high risk for moderate to severe OSA.  Will order sleep study to reassess need for nighttime positive airway pressure.

## 2022-07-12 NOTE — Assessment & Plan Note (Addendum)
Patient stable from a symptomatic standpoint.  Continues to deny overt signs of bleeding.  Confirmed history of complete abdominal hysterectomy.  Physical exam is notable for a 2 out of 6 flow murmur LUSB, otherwise reassuring but unrevealing of a source of blood loss. - Repeat CBC, iron, TIBC, ferritin. - Refer to gastroenterology for colonoscopy and EGD. - Continue taking ferrous sulfate 325 mg every other day.

## 2022-07-12 NOTE — Progress Notes (Signed)
Subjective:  CC: Abdominal pain  HPI:  [name] is a 51 y.o. female with a past medical history stated below and presents today for hospital follow-up visit after admission for symptomatic severe iron deficiency anemia. Please see problem based assessment and plan for additional details.  Patient described feeling "woozy" in the days leading up to her hospitalization.  She was evaluated in the clinic for this but her condition did not improve.  She was admitted for IV iron infusion and EPO.  Discharged in stable condition.  Today, complains of mild abdominal pain in right and left lower quadrants that has been ongoing since discharge.  Associated with decreased appetite.  She denies weight loss and changes to stooling habits.  Denies constipation.  Stool continues to be soft and dark.  Doing well with p.o. iron.  Last bowel movement was yesterday.  Otherwise, she feels well.  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  Patient no longer has menstrual periods, per chart review patient underwent total abdominal hysterectomy on 08/05/2015 for symptomatic uterine fibroids, endometriosis, dysfunctional uterine bleeding with associated anemia.  Past Medical History:  Diagnosis Date   Anemia    Congenital cerebral palsy (HCC)    Depression    Diabetes mellitus due to abnormal insulin ([place])    Edema 06/10/2017   Endometriosis    Hearing impairment    Heart murmur    at birth   Hypertension    Neuromuscular disorder ([place])    Cerebral palsy- very mild left side of brain   Schizoaffective disorder (HCC)    Thyroid nodule 07/30/2014   CT scan from 08/27/2010: A 1.9 cm calcified nodule involving the lower pole of the right lobe of the thyroid gland. US Neck 11/06/14: Bilateral nodules. Dominant right lower pole nodule measures 2.3 cm. Findings meet consensus criteria for biopsy. Ultrasound-guided fine needle aspiration should be considered Aspiration pathology: benign  follicular nodule    Current Outpatient Medications on File Prior to Visit  Medication Sig Dispense Refill   amLODipine (NORVASC) 5 MG tablet TAKE 1 TABLET (5 MG TOTAL) BY MOUTH DAILY. (Patient taking differently: Take 5 mg by mouth daily.) 90 tablet 3   atorvastatin (LIPITOR) 40 MG tablet Take 1 tablet (40 mg total) by mouth daily. 30 tablet 0   carvedilol (COREG) 6.25 MG tablet TAKE ONE (1) TABLET BY MOUTH TWO (2) TIMES DAILY WITH A MEAL (Patient taking differently: Take 6.25 mg by mouth 2 (two) times daily with a meal.) 120 tablet 3   DULoxetine (CYMBALTA) 30 MG capsule Take 1 capsule (30 mg total) by mouth daily. 30 capsule 3   ferrous sulfate 325 (65 FE) MG EC tablet Take 1 tablet (325 mg total) by mouth every other day. 15 tablet 0   liraglutide (VICTOZA) 18 MG/3ML SOPN INJECT 1.'8MG'$  INTO SKIN ONCE WEEKLY AS DIRECTED (Patient taking differently: Inject 1.8 mg into the skin once a week.) 9 mL 5   metFORMIN (GLUCOPHAGE) 1000 MG tablet TAKE 1 TABLET BY MOUTH 2 TIMES DAILY (Patient taking differently: Take 1,000 mg by mouth 2 (two) times daily.) 180 tablet 3   No current facility-administered medications on file prior to visit.    Family History  Problem Relation Age of Onset   Cancer Mother    Heart failure Mother    Hypertension Mother    Diabetes Mellitus II Mother    Diabetes Mellitus II Father    Hypertension Father     Social History  Socioeconomic History   Marital status: Married    Spouse name: Not on file   Number of children: Not on file   Years of education: Not on file   Highest education level: Not on file  Occupational History   Not on file  Tobacco Use   Smoking status: Never   Smokeless tobacco: Never  Substance and Sexual Activity   Alcohol use: No   Drug use: No   Sexual activity: Not Currently    Birth control/protection: Surgical  Other Topics Concern   Not on file  Social History Narrative   Not on file   Social Determinants of Health    Financial Resource Strain: Low Risk  (04/16/2022)   Overall Financial Resource Strain (CARDIA)    Difficulty of Paying Living Expenses: Not hard at all  Food Insecurity: Food Insecurity Present (04/16/2022)   Hunger Vital Sign    Worried About [place] in the Last Year: Often true    Ran Out of Food in the Last Year: Often true  Transportation Needs: No Transportation Needs (04/16/2022)   PRAPARE - Hydrologist (Medical): No    Lack of Transportation (Non-Medical): No  Physical Activity: Inactive (04/16/2022)   Exercise Vital Sign    Days of Exercise per Week: 0 days    Minutes of Exercise per Session: 0 min  Stress: No Stress Concern Present (04/16/2022)   Orr    Feeling of Stress : Only a little  Social Connections: Moderately Isolated (04/16/2022)   Social Connection and Isolation Panel [NHANES]    Frequency of Communication with Friends and Family: More than three times a week    Frequency of Social Gatherings with Friends and Family: Once a week    Attends Religious Services: Never    Marine scientist or Organizations: No    Attends Archivist Meetings: Never    Marital Status: Married  Human resources officer Violence: Not At Risk (04/16/2022)   Humiliation, Afraid, Rape, and Kick questionnaire    Fear of Current or Ex-Partner: No    Emotionally Abused: No    Physically Abused: No    Sexually Abused: No    Review of Systems: ROS negative except for what is noted on the assessment and plan.  Objective:   Vitals:   07/12/22 0833  BP: 113/69  Pulse: 84  Resp: (!) 32  Temp: 98.3 F (36.8 C)  TempSrc: Oral  SpO2: 100%  Weight: 210 lb 8 oz (95.5 kg)  Height: '5\' 4"'$  (1.626 m)    Physical Exam: Constitutional: well-appearing female sitting in chair, in no acute distress HENT: normocephalic atraumatic, mucous membranes moist Eyes: Pale conjunctival  membranes Neck: supple Cardiovascular: Regular rate and rhythm, 2 out of 6 flow murmur. Pulmonary/Chest: normal work of breathing on room air, lungs clear to auscultation bilaterally Abdominal: Diffusely tender to deep palpation.  Soft and nondistended. MSK: normal bulk and tone Neurological: alert & oriented x 3, 5/5 strength in bilateral upper and lower extremities, normal gait Skin: warm and dry Psych: Appropriate mood and affect.   Assessment & Plan:  Hypertension Normotensive today.  Antihypertensives were resumed on discharge.  Patient continues to take amlodipine 5 mg daily and carvedilol 6.25 mg twice daily.  Snoring Reviewed sleep study from 2019.  It was not diagnostic of OSA.  CPAP not recommended at that time.  STOP-BANG score of 5 indicative of high risk  for moderate to severe OSA.  Will order sleep study to reassess need for nighttime positive airway pressure.  Type 2 diabetes mellitus (HCC) A1c of 6.5%.  Continue metformin and Victoza. Will check yearly microalbumin/creatinine ratio.  Hyperlipidemia LDL goal <70 We will repeat yearly lipid panel today.  For now continue atorvastatin 40 mg daily.  Iron deficiency anemia Patient stable from a symptomatic standpoint.  Continues to deny overt signs of bleeding.  Confirmed history of complete abdominal hysterectomy.  Physical exam is notable for a 2 out of 6 flow murmur LUSB, otherwise reassuring but unrevealing of a source of blood loss. - Repeat CBC, iron, TIBC, ferritin. - Refer to gastroenterology for colonoscopy and EGD. - Continue taking ferrous sulfate 325 mg every other day.    Patient seen with Dr. Loree Fee, M.D. [facility]  PGY-1 Pager: [phone] Date 07/12/2022  Time 4:22 PM

## 2022-07-12 NOTE — Assessment & Plan Note (Signed)
Normotensive today.  Antihypertensives were resumed on discharge.  Patient continues to take amlodipine 5 mg daily and carvedilol 6.25 mg twice daily.

## 2022-07-12 NOTE — Patient Instructions (Signed)
Thank you, [name] for allowing [place] to provide your care today. Today we discussed anemia, risk of obstructive sleep apnea, diabetes, high cholesterol, and hypertension.    I have ordered the following labs for you:  Lab Orders         CBC no Diff         Iron, TIBC and Ferritin Panel         Lipid Profile         Microalbumin / Creatinine Urine Ratio      Referrals ordered today:   Referral Orders         Ambulatory referral to Gastroenterology       I have ordered the following medication/changed the following medications:   Stop the following medications: There are no discontinued medications.   Start the following medications: No orders of the defined types were placed in this encounter.    Follow up: 2 months   We look forward to seeing you next time. Please call our clinic at [phone] if you have any questions or concerns. The best time to call is Monday-Friday from 9am-4pm, but there is someone available 24/7. If after hours or the weekend, call the main hospital number and ask for the Internal Medicine Resident On-Call. If you need medication refills, please notify your pharmacy one week in advance and they will send [place] a request.   Thank you for trusting me with your care. Wishing you the best!   Nani Gasser, MD [place]

## 2022-07-12 NOTE — Assessment & Plan Note (Addendum)
A1c of 6.5%.  Continue metformin and Victoza. Will check yearly microalbumin/creatinine ratio.

## 2022-07-13 LAB — CBC
Hematocrit: 30.6 % — ABNORMAL LOW (ref 34.0–46.6)
Hemoglobin: 8.8 g/dL — ABNORMAL LOW (ref 11.1–15.9)
MCH: 21.8 pg — ABNORMAL LOW (ref 26.6–33.0)
MCHC: 28.8 g/dL — ABNORMAL LOW (ref 31.5–35.7)
MCV: 76 fL — ABNORMAL LOW (ref 79–97)
Platelets: 576 10*3/uL — ABNORMAL HIGH (ref 150–450)
RBC: 4.03 x10E6/uL (ref 3.77–5.28)
RDW: 28.8 % — ABNORMAL HIGH (ref 11.7–15.4)
WBC: 6 10*3/uL (ref 3.4–10.8)

## 2022-07-13 LAB — LIPID PANEL
Chol/HDL Ratio: 2.1 ratio (ref 0.0–4.4)
Cholesterol, Total: 78 mg/dL — ABNORMAL LOW (ref 100–199)
HDL: 37 mg/dL — ABNORMAL LOW (ref >39)
LDL Chol Calc (NIH): 30 mg/dL (ref 0–99)
Triglycerides: 34 mg/dL (ref 0–149)
VLDL Cholesterol Cal: 11 mg/dL (ref 5–40)

## 2022-07-13 LAB — IRON,TIBC AND FERRITIN PANEL
Ferritin: 106 ng/mL (ref 15–150)
Iron Saturation: 17 % (ref 15–55)
Iron: 54 ug/dL (ref 27–159)
Total Iron Binding Capacity: 313 ug/dL (ref 250–450)
UIBC: 259 ug/dL (ref 131–425)

## 2022-07-13 LAB — MICROALBUMIN / CREATININE URINE RATIO
Creatinine, Urine: 150.4 mg/dL
Microalb/Creat Ratio: 7 mg/g creat (ref 0–29)
Microalbumin, Urine: 10 ug/mL

## 2022-07-14 NOTE — Progress Notes (Signed)
Internal Medicine Clinic Attending  I saw and evaluated the patient.  I personally confirmed the key portions of the history and exam documented by Dr. McLendon and I reviewed pertinent patient test results.  The assessment, diagnosis, and plan were formulated together and I agree with the documentation in the resident's note.  

## 2022-07-26 ENCOUNTER — Other Ambulatory Visit: Payer: Self-pay | Admitting: Internal Medicine

## 2022-07-26 DIAGNOSIS — I10 Essential (primary) hypertension: Secondary | ICD-10-CM

## 2022-08-02 ENCOUNTER — Ambulatory Visit (HOSPITAL_BASED_OUTPATIENT_CLINIC_OR_DEPARTMENT_OTHER): Payer: Medicare Other | Attending: Internal Medicine | Admitting: Internal Medicine

## 2022-08-02 VITALS — Ht 64.0 in | Wt 210.0 lb

## 2022-08-02 DIAGNOSIS — G478 Other sleep disorders: Secondary | ICD-10-CM

## 2022-08-02 DIAGNOSIS — G4733 Obstructive sleep apnea (adult) (pediatric): Secondary | ICD-10-CM | POA: Diagnosis present

## 2022-08-06 ENCOUNTER — Other Ambulatory Visit: Payer: Self-pay | Admitting: Student

## 2022-08-06 MED ORDER — FERROUS SULFATE 325 (65 FE) MG PO TBEC: 325.0000 mg | DELAYED_RELEASE_TABLET | ORAL | 0 refills | Status: DC

## 2022-08-06 NOTE — Telephone Encounter (Signed)
Refill Request   ferrous sulfate 325 (65 FE) MG EC tablet (Expired  [id number] - [place] ([place]), [place] - [address]

## 2022-08-08 DIAGNOSIS — G4733 Obstructive sleep apnea (adult) (pediatric): Secondary | ICD-10-CM | POA: Diagnosis not present

## 2022-08-08 NOTE — Procedures (Signed)
     Patient Name: [name], [name] Date: 08/02/2022 Gender: Female D.O.B: 1971-10-14 Age (years): 51 Referring Provider: Velna Ochs Height (inches): 71 Interpreting Physician: Baird Lyons MD, ABSM Weight (lbs): 210 RPSGT: Laren Everts BMI: 36 MRN: [id number] Neck Size: 17.00  CLINICAL INFORMATION Sleep Study Type: NPSG Indication for sleep study: Daytime Fatigue, Depression, Diabetes, Fatigue, Hypertension, Morning Headaches, Non-refreshing Sleep, Obesity, Re-Evaluation, Snoring, Witnesses Apnea / Gasping During Sleep Epworth Sleepiness Score: 5  Most recent polysomnogram dated 05/16/2018 revealed an AHI of 1.5/h and RDI of 1.7/h.  SLEEP STUDY TECHNIQUE As per the AASM Manual for the Scoring of Sleep and Associated Events v2.3 (April 2016) with a hypopnea requiring 4% desaturations.  The channels recorded and monitored were frontal, central and occipital EEG, electrooculogram (EOG), submentalis EMG (chin), nasal and oral airflow, thoracic and abdominal wall motion, anterior tibialis EMG, snore microphone, electrocardiogram, and pulse oximetry.  MEDICATIONS Medications self-administered by patient taken the night of the study : METFORMIN, CARVEDILOL, BENZTROPINE, DULOXETINE  SLEEP ARCHITECTURE The study was initiated at 9:58:36 PM and ended at 5:05:55 AM.  Sleep onset time was 115.6 minutes and the sleep efficiency was 48.6%%. The total sleep time was 207.7 minutes.  Stage REM latency was N/A minutes.  The patient spent 6.7%% of the night in stage N1 sleep, 93.3%% in stage N2 sleep, 0.0%% in stage N3 and 0% in REM.  Alpha intrusion was absent.  Supine sleep was 0.00%.  RESPIRATORY PARAMETERS The overall apnea/hypopnea index (AHI) was 0.3 per hour. There were 0 total apneas, including 0 obstructive, 0 central and 0 mixed apneas. There were 1 hypopneas and 0 RERAs.  The AHI during Stage REM sleep was N/A per hour.  AHI while supine was N/A per  hour.  The mean oxygen saturation was 94.8%. The minimum SpO2 during sleep was 92.0%.  moderate snoring was noted during this study.  CARDIAC DATA The 2 lead EKG demonstrated sinus rhythm. The mean heart rate was 86.5 beats per minute. Other EKG findings include: None.  LEG MOVEMENT DATA The total PLMS were 0 with a resulting PLMS index of 0.0. Associated arousal with leg movement index was 0.0 .  IMPRESSIONS - No significant obstructive sleep apnea occurred during this study (AHI = 0.3/h). - The patient had minimal or no oxygen desaturation during the study (Min O2 = 92.0%) - The patient snored with moderate snoring volume. - No cardiac abnormalities were noted during this study. - Clinically significant periodic limb movements did not occur during sleep. No significant associated arousals. - Patient had difficulty initiating sleep,with sustained sleep not noted until 02:00 AM. Stages  N3 and REM were not achieved.  DIAGNOSIS - Other sleep disorder (F51.8)  RECOMMENDATIONS - Consider if management for insomnia or Delayed Sleep Phase Syndrome would be appropriate. - Sleep hygiene should be reviewed to assess factors that may improve sleep quality. - Weight management and regular exercise should be initiated or continued if appropriate.  [Electronically signed] 08/08/2022 12:18 PM  Baird Lyons MD, [place], American Board of Sleep Medicine NPI: [id number]                        [place], [place] [facility]  ELECTRONICALLY SIGNED ON:  08/08/2022, 12:14 PM [place] PH: [phone]   FX: [phone] [place]

## 2022-08-18 NOTE — Progress Notes (Signed)
AWV is reviewed.  It has prepopulated "goals" section which was entered for 2022 and has not been updated.  This needs to be deleted and discussed at next visit.

## 2022-08-24 ENCOUNTER — Other Ambulatory Visit: Payer: Self-pay | Admitting: Internal Medicine

## 2022-08-31 ENCOUNTER — Other Ambulatory Visit: Payer: Self-pay | Admitting: Student

## 2022-09-20 ENCOUNTER — Other Ambulatory Visit: Payer: Self-pay | Admitting: Internal Medicine

## 2022-09-20 DIAGNOSIS — E1121 Type 2 diabetes mellitus with diabetic nephropathy: Secondary | ICD-10-CM

## 2022-09-20 NOTE — Telephone Encounter (Signed)
Patient uses victoza which require pen needles.

## 2022-09-27 ENCOUNTER — Telehealth: Payer: Self-pay | Admitting: *Deleted

## 2022-09-27 NOTE — Telephone Encounter (Signed)
Patient called in stating she ate biscuits and lima beans yesterday AM and by the time she got to service she felt "hot," was having abdominal cramps, burping and vomited 6 times. No vomiting today but has "a little nausea," and 4 watery stools today. Denies fever/chills. She is keeping down soup and ginger ale today. Discussed importance of staying well hydrated. Advised Pedialyte, and imodium PRN. Discussed asking her Pharmacist for assistance locating these items. Also, discussed BRAT diet when she feels up to it. Advised she head to ED if symptoms worsen or fail to improve.

## 2022-09-28 NOTE — Telephone Encounter (Signed)
Agree, thank you

## 2022-09-28 NOTE — Telephone Encounter (Signed)
Attempted to call patient to see how she is doing and to schedule appt for Friday. No answer and no VM set up.

## 2022-09-28 NOTE — Telephone Encounter (Signed)
Pt states she's feeling better; states she still having some diarrhea but no c/o weakness. She has been drinking water and ginger ale. Inform pt she can also drink Gatorade and Pedialyte. Appt schedule with Dr Stann Mainland on Friday 11/17 @ 0945 Am - pt inform to call for sooner appt if symptoms worsen.

## 2022-10-01 ENCOUNTER — Ambulatory Visit (INDEPENDENT_AMBULATORY_CARE_PROVIDER_SITE_OTHER): Payer: Medicare Other

## 2022-10-01 VITALS — BP 138/83 | HR 75 | Temp 97.7°F | Ht 64.0 in | Wt 215.6 lb

## 2022-10-01 DIAGNOSIS — E119 Type 2 diabetes mellitus without complications: Secondary | ICD-10-CM | POA: Diagnosis present

## 2022-10-01 DIAGNOSIS — K529 Noninfective gastroenteritis and colitis, unspecified: Secondary | ICD-10-CM | POA: Diagnosis not present

## 2022-10-01 DIAGNOSIS — R61 Generalized hyperhidrosis: Secondary | ICD-10-CM | POA: Diagnosis not present

## 2022-10-01 DIAGNOSIS — D509 Iron deficiency anemia, unspecified: Secondary | ICD-10-CM | POA: Diagnosis not present

## 2022-10-01 DIAGNOSIS — Z7984 Long term (current) use of oral hypoglycemic drugs: Secondary | ICD-10-CM

## 2022-10-01 DIAGNOSIS — I10 Essential (primary) hypertension: Secondary | ICD-10-CM

## 2022-10-01 LAB — POCT GLYCOSYLATED HEMOGLOBIN (HGB A1C): Hemoglobin A1C: 6.9 % — AB (ref 4.0–5.6)

## 2022-10-01 LAB — GLUCOSE, CAPILLARY: Glucose-Capillary: 97 mg/dL (ref 70–99)

## 2022-10-01 MED ORDER — AMLODIPINE BESYLATE 10 MG PO TABS: 10.0000 mg | ORAL_TABLET | Freq: Every day | ORAL | 11 refills | Status: DC

## 2022-10-01 NOTE — Patient Instructions (Signed)
Thank you, Ms.Johnanna Schneiders for allowing [place] to provide your care today. Today we discussed :  High blood pressure- Your blood pressure was a little elevated. We increased your amlodipine to 58m daily.  Chronic diarrhea- We are doing some lab tests to see if celiac disease, thyroid disease or inflammation are the cause of the diarrhea. We are also working on getting you to see the GI doctors to rule out cancer or other inflammatory conditions of the colon. There are many causes of chronic diarrhea, some are dangerous and some are not dangerous. Right now we need to rule out dangerous ones and will eventually get to the bottom of the cause.  Diabetes- Your diabetes is well controlled and your A1c today was 6.9%. Keep up the good work.  I have ordered the following labs for you:  Lab Orders         Glucose, capillary         CBC no Diff         Iron, TIBC and Ferritin Panel         Sed Rate (ESR)         CRP (C-Reactive Protein)         TSH         Tissue Transglutaminase Abs,IgG,IgA         POC Hbg A1C        Referrals ordered today:   Referral Orders  No referral(s) requested today     I have ordered the following medication/changed the following medications:   Stop the following medications: Medications Discontinued During This Encounter  Medication Reason   amLODipine (NORVASC) 5 MG tablet Dose change     Start the following medications: Meds ordered this encounter  Medications   amLODipine (NORVASC) 10 MG tablet    Sig: Take 1 tablet (10 mg total) by mouth daily.    Dispense:  30 tablet    Refill:  11     Follow up: 3 months   We look forward to seeing you next time. Please call our clinic at [phone] you have any questions or concerns. The best time to call is Monday-Friday from 9am-4pm, but there is someone available 24/7. If after hours or the weekend, call the main hospital number and ask for the Internal Medicine Resident On-Call. If you need medication  refills, please notify your pharmacy one week in advance and they will send [place] request.   Thank you for trusting me with your care. Wishing you the best!   TIona Coach MD [place]

## 2022-10-01 NOTE — Progress Notes (Unsigned)
Amlodipine 5  Atorvastatin 40 Carvedilol 6.25 BID LDL 30 06/2022  Duloxetine  Ferrous sulfate  Victoza 1.8 weekly Metformin 1000 BID A1c= Microalbumin normal 06/2022  Foot Flu Optho colonoscopy

## 2022-10-03 DIAGNOSIS — K529 Noninfective gastroenteritis and colitis, unspecified: Secondary | ICD-10-CM | POA: Insufficient documentation

## 2022-10-03 LAB — TISSUE TRANSGLUTAMINASE ABS,IGG,IGA
Tissue Transglut Ab: <2 U/mL (ref 0–5)
Transglutaminase IgA: <2 U/mL (ref 0–3)

## 2022-10-03 NOTE — Assessment & Plan Note (Signed)
A1c increased from 6.5% to 6.9%. Foot exam normal.Continue victoza 1.'8mg'$  weekly and metformin 1000 BID.

## 2022-10-03 NOTE — Assessment & Plan Note (Signed)
BP on recheck elevated to 138/83. Will increase current amlodipine dose instead of adding new meds given that she is close to goal. -Increase amlodipine to 10 mg daily -Continue coreg 6.25 BID

## 2022-10-03 NOTE — Progress Notes (Signed)
Established Patient Office Visit  Subjective   Patient ID: [name], female    DOB: 1971-08-31  Age: 51 y.o. MRN: [id number]  Chief Complaint  Patient presents with   Diabetes   Follow-up    Ms. Ehresman is a 51 y/o female with a pmh outlined below. Please see encounter tab for HPI and A/P information.  Diabetes      Review of Systems  All other systems reviewed and are negative.     Objective:     BP 138/83 (BP Location: Left Arm, Patient Position: Sitting, Cuff Size: Small)   Pulse 75   Temp 97.7 F (36.5 C) (Oral)   Ht _0  (1.626 m)   Wt 215 lb 9.6 oz (97.8 kg)   LMP 07/23/2015 (Exact Date)   SpO2 97%   BMI 37.01 kg/m    Physical Exam Constitutional:      General: She is not in acute distress.    Appearance: Normal appearance. She is obese. She is not ill-appearing.  Eyes:     General: No scleral icterus.    Conjunctiva/sclera: Conjunctivae normal.  Cardiovascular:     Rate and Rhythm: Normal rate and regular rhythm.     Pulses: Normal pulses.     Heart sounds: Normal heart sounds. No murmur heard.    No gallop.  Pulmonary:     Effort: Pulmonary effort is normal. No respiratory distress.     Breath sounds: Normal breath sounds. No wheezing or rales.  Abdominal:     General: Abdomen is flat. Bowel sounds are normal. There is no distension.     Palpations: Abdomen is soft. There is no mass.     Tenderness: There is no right CVA tenderness, left CVA tenderness, guarding or rebound.     Hernia: No hernia is present.     Comments: Minimal ptp over the right and left lower quadrants  Musculoskeletal:     Right lower leg: No edema.     Left lower leg: No edema.  Skin:    General: Skin is warm and dry.     Capillary Refill: Capillary refill takes less than 2 seconds.  Neurological:     Mental Status: She is alert.      Results for orders placed or performed in visit on 10/01/22  Glucose, capillary  Result Value Ref Range   Glucose-Capillary  97 70 - 99 mg/dL  CBC no Diff  Result Value Ref Range   WBC 6.4 3.4 - 10.8 x10E3/uL   RBC 3.73 (L) 3.77 - 5.28 x10E6/uL   Hemoglobin 8.7 (L) 11.1 - 15.9 g/dL   Hematocrit 29.1 (L) 34.0 - 46.6 %   MCV 78 (L) 79 - 97 fL   MCH 23.3 (L) 26.6 - 33.0 pg   MCHC 29.9 (L) 31.5 - 35.7 g/dL   RDW 19.5 (H) 11.7 - 15.4 %   Platelets 317 150 - 450 x10E3/uL  Iron, TIBC and Ferritin Panel  Result Value Ref Range   Total Iron Binding Capacity 309 250 - 450 ug/dL   UIBC 291 131 - 425 ug/dL   Iron 18 (L) 27 - 159 ug/dL   Iron Saturation 6 (LL) 15 - 55 %   Ferritin 8 (L) 15 - 150 ng/mL  Sed Rate (ESR)  Result Value Ref Range   Sed Rate 4 0 - 32 mm/hr  CRP (C-Reactive Protein)  Result Value Ref Range   CRP WILL FOLLOW   TSH  Result Value Ref Range  TSH 3.340 0.450 - 4.500 uIU/mL  Tissue Transglutaminase Abs,IgG,IgA  Result Value Ref Range   Transglutaminase IgA <2 0 - 3 U/mL   Tissue Transglut Ab <2 0 - 5 U/mL  POC Hbg A1C  Result Value Ref Range   Hemoglobin A1C 6.9 (A) 4.0 - 5.6 %   HbA1c POC (<> result, manual entry)     HbA1c, POC (prediabetic range)     HbA1c, POC (controlled diabetic range)        The ASCVD Risk score (Arnett DK, et al., 2019) failed to calculate for the following reasons:   The valid total cholesterol range is 130 to 320 mg/dL    Assessment & Plan:   Problem List Items Addressed This Visit       Cardiovascular and Mediastinum   Hypertension (Chronic)    BP on recheck elevated to 138/83. Will increase current amlodipine dose instead of adding new meds given that she is close to goal. -Increase amlodipine to 10 mg daily -Continue coreg 6.25 BID      Relevant Medications   amLODipine (NORVASC) 10 MG tablet     Digestive   Chronic diarrhea of unknown origin    Patient reports roughly 10 weeks of watery diarrhea occurring 5-10 times daily. She endorses associated lower abdominal burning pain, night sweats drenching her gown, nocturnal diarrhea waking her  from sleep, diarrhea without eating, black stools since starting iron supplementation for unexplained IDA for which she was hospitalized 06/2022, bloating .She denies fever, frank blood on the toilet paper or in the bowl,  reflux symptoms/burning chest or epigastric pain, N/V, difficulty flushing her stool. She has not had recent out of country travel or exposure to outdoor fresh water. Her son lives at home and has not had similar symptoms. On exam she has minimal right and left lower quadrant abdominal pain but no palpable masses and no rebound or guarding. Her symptoms are concerning for inflammatory cause especially given non-food related diarrhea waking her up from sleep. The differential for this includes crohn's, UC or microscopic colitis although ESR and CRP were wnl. New bowel habit changes, night sweats and unexplained IDA are also concerning for GI malignancy. The patient will need to see GI for colonoscopy to evaluate this. TSH was normal ruling out thyroid disease. The patient has not started any new medications, would not suspect her stable dose of metformin to be causing this sort of GI upset especially given that she has been on this long before GI symptoms. May be worth considering C diff given that this started after hospitalization in August 2023 and seems secretory in nature. -GI referral        Endocrine   Type 2 diabetes mellitus (HCC) - Primary (Chronic)    A1c increased from 6.5% to 6.9%. Foot exam normal.Continue victoza 1.43m weekly and metformin 1000 BID.      Relevant Orders   POC Hbg A1C (Completed)     Other   Iron deficiency anemia    Patient no longer having lightheadedness or dizziness.Iron 18, iron sat 6, ferritin 8, hemoglobin 8/28 to 11/17 from 8.8 to 8.7. Will need to get seen by GI to evaluate for GI bleeding source as cause of iron loss. -Continue taking ferrous sulfate 325 mg every other day       Relevant Orders   CBC no Diff (Completed)   Iron, TIBC and  Ferritin Panel (Completed)   Sed Rate (ESR) (Completed)   CRP (C-Reactive Protein) (Completed)  Tissue Transglutaminase Abs,IgG,IgA (Completed)   Other Visit Diagnoses     Essential hypertension       Relevant Medications   amLODipine (NORVASC) 10 MG tablet   Chronic diarrhea       Relevant Orders   CBC no Diff (Completed)   Iron, TIBC and Ferritin Panel (Completed)   Sed Rate (ESR) (Completed)   CRP (C-Reactive Protein) (Completed)   TSH (Completed)   Tissue Transglutaminase Abs,IgG,IgA (Completed)   Night sweats       Relevant Orders   TSH (Completed)       No follow-ups on file.    Iona Coach, MD

## 2022-10-03 NOTE — Assessment & Plan Note (Addendum)
Patient reports roughly 10 weeks of watery diarrhea occurring 5-10 times daily. She endorses associated lower abdominal burning pain, night sweats drenching her gown, nocturnal diarrhea waking her from sleep, diarrhea without eating, black stools since starting iron supplementation for unexplained IDA for which she was hospitalized 06/2022, bloating .She denies fever, frank blood on the toilet paper or in the bowl,  reflux symptoms/burning chest or epigastric pain, N/V, difficulty flushing her stool. She has not had recent out of country travel or exposure to outdoor fresh water. Her son lives at home and has not had similar symptoms. On exam she has minimal right and left lower quadrant abdominal pain but no palpable masses and no rebound or guarding. Her symptoms are concerning for inflammatory cause especially given non-food related diarrhea waking her up from sleep. The differential for this includes crohn's, UC or microscopic colitis although ESR and CRP were wnl. New bowel habit changes, night sweats and unexplained IDA are also concerning for GI malignancy. The patient will need to see GI for colonoscopy to evaluate this. TSH was normal ruling out thyroid disease. The patient has not started any new medications, would not suspect her stable dose of metformin to be causing this sort of GI upset especially given that she has been on this long before GI symptoms. May be worth considering C diff given that this started after hospitalization in August 2023 and seems secretory in nature. -GI referral

## 2022-10-03 NOTE — Assessment & Plan Note (Addendum)
Patient no longer having lightheadedness or dizziness.Iron 18, iron sat 6, ferritin 8, hemoglobin 8/28 to 11/17 from 8.8 to 8.7. Will need to get seen by GI to evaluate for GI bleeding source as cause of iron loss. -Continue taking ferrous sulfate 325 mg every other day

## 2022-10-04 LAB — SEDIMENTATION RATE: Sed Rate: 4 mm/hr (ref 0–32)

## 2022-10-04 LAB — IRON,TIBC AND FERRITIN PANEL
Ferritin: 8 ng/mL — ABNORMAL LOW (ref 15–150)
Iron Saturation: 6 % — CL (ref 15–55)
Iron: 18 ug/dL — ABNORMAL LOW (ref 27–159)
Total Iron Binding Capacity: 309 ug/dL (ref 250–450)
UIBC: 291 ug/dL (ref 131–425)

## 2022-10-04 LAB — C-REACTIVE PROTEIN: CRP: 6 mg/L (ref 0–10)

## 2022-10-04 LAB — CBC
Hematocrit: 29.1 % — ABNORMAL LOW (ref 34.0–46.6)
Hemoglobin: 8.7 g/dL — ABNORMAL LOW (ref 11.1–15.9)
MCH: 23.3 pg — ABNORMAL LOW (ref 26.6–33.0)
MCHC: 29.9 g/dL — ABNORMAL LOW (ref 31.5–35.7)
MCV: 78 fL — ABNORMAL LOW (ref 79–97)
Platelets: 317 10*3/uL (ref 150–450)
RBC: 3.73 x10E6/uL — ABNORMAL LOW (ref 3.77–5.28)
RDW: 19.5 % — ABNORMAL HIGH (ref 11.7–15.4)
WBC: 6.4 10*3/uL (ref 3.4–10.8)

## 2022-10-04 LAB — TSH: TSH: 3.34 u[IU]/mL (ref 0.450–4.500)

## 2022-10-05 NOTE — Progress Notes (Signed)
Internal Medicine Clinic Attending  I saw and evaluated the patient.  I personally confirmed the key portions of the history and exam documented by Dr. Rogers and I reviewed pertinent patient test results.  The assessment, diagnosis, and plan were formulated together and I agree with the documentation in the resident's note.  

## 2022-10-13 IMAGING — DX DG CHEST 2V
2 series · 2 of 2 positions shown · non-contrast
Comparison: 11/30/2016

CLINICAL DATA: Cough

EXAM:
CHEST - 2 VIEW

[chest pa]
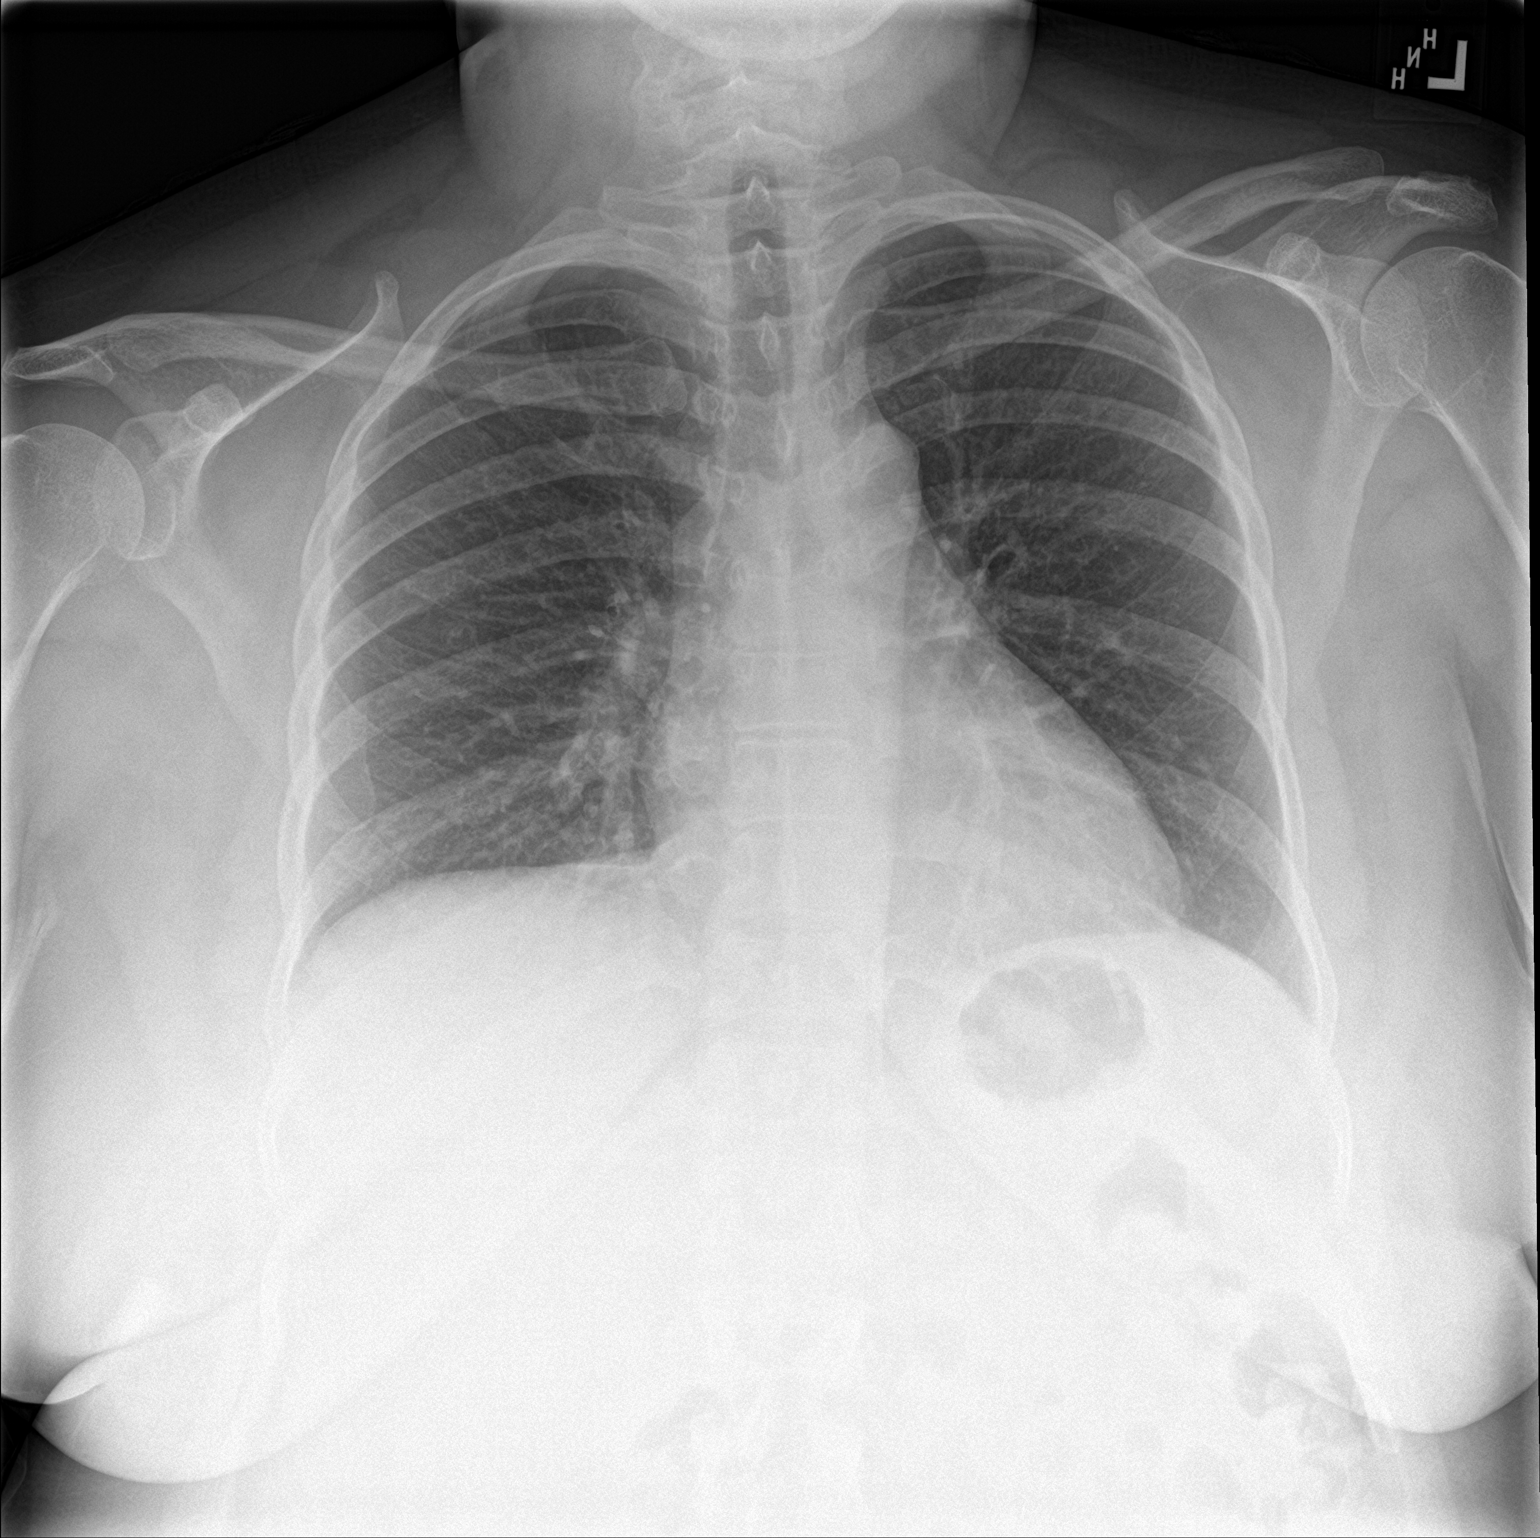

[chest lat]
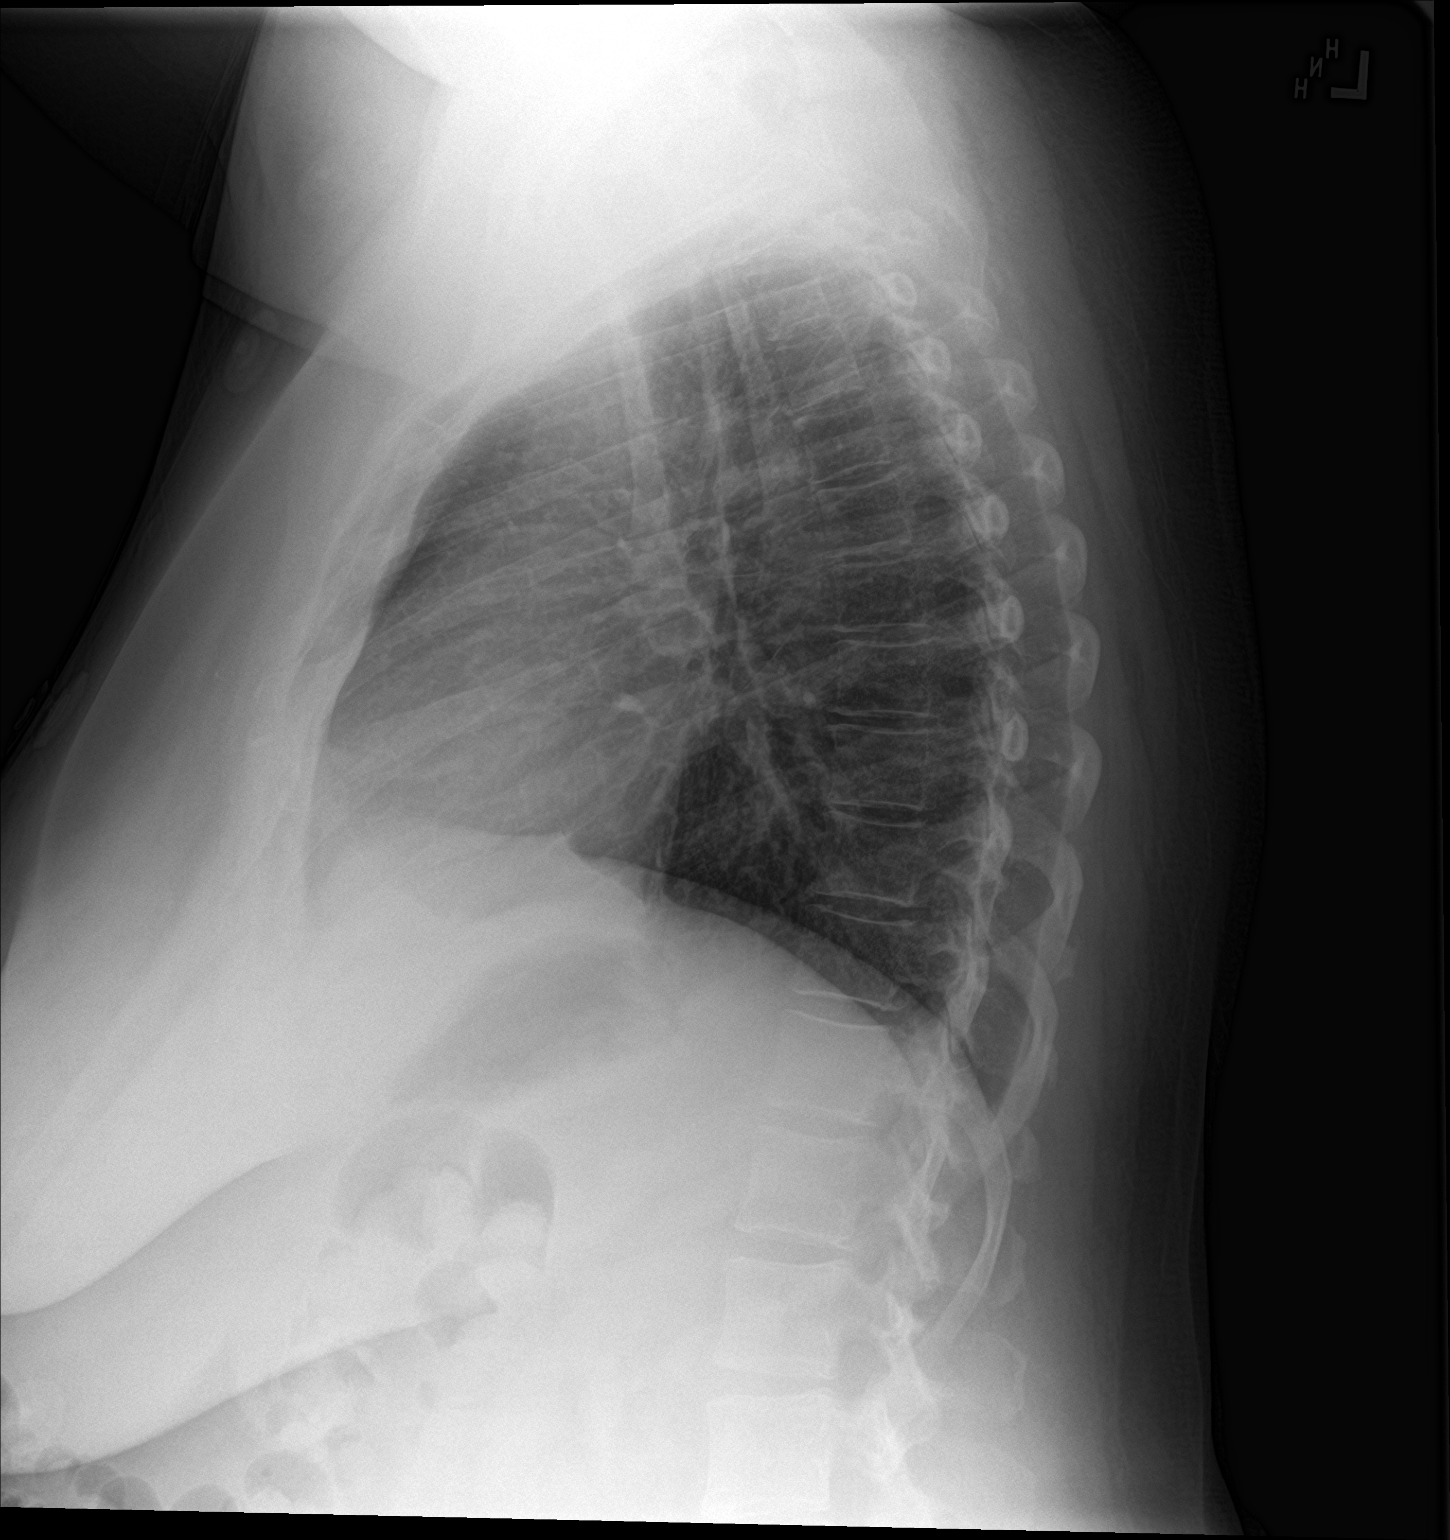

[2 of 2 positions shown; findings below may reference images not displayed]

FINDINGS: The heart size and mediastinal contours are within normal limits.
Both lungs are clear. The visualized skeletal structures are
unremarkable.
IMPRESSION: No active cardiopulmonary disease.

## 2022-10-26 ENCOUNTER — Telehealth: Payer: Self-pay

## 2022-10-26 NOTE — Telephone Encounter (Signed)
RTC to patient states has a cough.  Non-productive unable to bring anything up.  No fevers or chills .  Body aches and chest as well stuffy nose and congestion.  Has been going on x 1 week.  Has not taken a Covid Test as of yet.  Wants to get an Antibiotic called in.   Has taken Robitussin DM only.

## 2022-10-26 NOTE — Telephone Encounter (Signed)
Requesting to speak with a nurse about cough and cold. Pt would like antibiotic. Please call pt back.

## 2022-10-27 NOTE — Telephone Encounter (Signed)
Sorry to hear she has a cough. Mostly has a viral URI that would not benefit from antibiotics. Would recommend she continue with supportive measures. If she is concerned symptoms are worsening or would like to discuss more would recommend she make an appointment for further evaluation.

## 2022-10-27 NOTE — Telephone Encounter (Signed)
RTC to patient informed her per Dr. Lisabeth Devoid that her symptoms are viral and that an Antibiotic would not help with this. Continue with supportive measures and to call if symptoms worsen for an appointment.   Patient voiced understanding of .

## 2022-11-10 ENCOUNTER — Other Ambulatory Visit: Payer: Self-pay | Admitting: Internal Medicine

## 2022-11-10 DIAGNOSIS — E1121 Type 2 diabetes mellitus with diabetic nephropathy: Secondary | ICD-10-CM

## 2022-11-11 ENCOUNTER — Telehealth: Payer: Self-pay | Admitting: *Deleted

## 2022-11-11 DIAGNOSIS — E1121 Type 2 diabetes mellitus with diabetic nephropathy: Secondary | ICD-10-CM

## 2022-11-11 MED ORDER — VICTOZA 18 MG/3ML ~~LOC~~ SOPN: 1.8000 mg | PEN_INJECTOR | Freq: Every day | SUBCUTANEOUS | 5 refills | Status: DC

## 2022-11-11 NOTE — Telephone Encounter (Signed)
Prescription fixed to state take victoza daily

## 2022-11-16 ENCOUNTER — Encounter: Payer: Self-pay | Admitting: Gastroenterology

## 2022-11-16 ENCOUNTER — Other Ambulatory Visit: Payer: Self-pay | Admitting: Student

## 2022-11-16 DIAGNOSIS — E1121 Type 2 diabetes mellitus with diabetic nephropathy: Secondary | ICD-10-CM

## 2022-11-16 DIAGNOSIS — I10 Essential (primary) hypertension: Secondary | ICD-10-CM

## 2022-11-16 DIAGNOSIS — E119 Type 2 diabetes mellitus without complications: Secondary | ICD-10-CM

## 2022-11-16 MED ORDER — BD PEN NEEDLE NANO U/F 32G X 4 MM MISC: 1 refills | Status: DC

## 2022-11-16 MED ORDER — CARVEDILOL 6.25 MG PO TABS: 6.2500 mg | ORAL_TABLET | Freq: Two times a day (BID) | ORAL | 2 refills | Status: DC

## 2022-11-16 MED ORDER — AMLODIPINE BESYLATE 10 MG PO TABS: 10.0000 mg | ORAL_TABLET | Freq: Every day | ORAL | 11 refills | Status: DC

## 2022-11-16 MED ORDER — DULOXETINE HCL 30 MG PO CPEP: 30.0000 mg | ORAL_CAPSULE | Freq: Every day | ORAL | 3 refills | Status: AC

## 2022-11-16 MED ORDER — VICTOZA 18 MG/3ML ~~LOC~~ SOPN: 1.8000 mg | PEN_INJECTOR | Freq: Every day | SUBCUTANEOUS | 5 refills | Status: DC

## 2022-11-16 MED ORDER — METFORMIN HCL 1000 MG PO TABS: 1000.0000 mg | ORAL_TABLET | Freq: Two times a day (BID) | ORAL | 3 refills | Status: DC

## 2022-11-16 MED ORDER — ATORVASTATIN CALCIUM 40 MG PO TABS: 40.0000 mg | ORAL_TABLET | Freq: Every day | ORAL | 2 refills | Status: DC

## 2022-11-16 MED ORDER — FERROUS SULFATE 325 (65 FE) MG PO TBEC: 1.0000 | DELAYED_RELEASE_TABLET | ORAL | 2 refills | Status: AC

## 2022-11-16 NOTE — Telephone Encounter (Signed)
Pt requesting to transfer all medications to the following pharmacy:   amLODipine (NORVASC) 10 MG tablet  atorvastatin (LIPITOR) 40 MG tablet BD PEN NEEDLE NANO U/F 32G X 4 MM MISC  carvedilol (COREG) 6.25 MG tablet  DULoxetine (CYMBALTA) 30 MG capsule  ferrous sulfate 325 (65 FE) MG EC tablet  liraglutide (VICTOZA) 18 MG/3ML SOPN  metFORMIN (GLUCOPHAGE) 1000 MG    [id number] - [place], [place] - [place] [address]

## 2022-11-17 ENCOUNTER — Ambulatory Visit (INDEPENDENT_AMBULATORY_CARE_PROVIDER_SITE_OTHER): Payer: Medicare Other

## 2022-11-17 VITALS — BP 147/81 | Temp 98.3°F | Ht 64.0 in | Wt 212.4 lb

## 2022-11-17 DIAGNOSIS — D509 Iron deficiency anemia, unspecified: Secondary | ICD-10-CM | POA: Diagnosis present

## 2022-11-17 DIAGNOSIS — K921 Melena: Secondary | ICD-10-CM | POA: Diagnosis not present

## 2022-11-17 NOTE — Assessment & Plan Note (Signed)
History of IDA in the setting of likely GI blood loss. Denies falls, dizziness, lightheadedness. Bloody stools for several weeks now.  -repeat CBC -continue ferrous sulfate 325 mg every other day

## 2022-11-17 NOTE — Assessment & Plan Note (Signed)
Patient reports that soon after recent OV, she developed bloody stools. Initially with a small clot on wiping. Since that day (~Nov 27), she has had streaks of blood in the stool. Also reports fatigue but denies lightheadedness, falls, dizziness. Reports continued watery diarrhea multiple times a day as well. Stools continue to be dark, which she correlates with ferrous sulfate. She is not hypotensive or tachycardic. Right lower quadrant tenderness on exam. Suspect GI blood loss. Patient has yet to have colonoscopy. Encouraged her to follow up with GI later this month. Will get CBC to follow hgb in the meantime.  -repeat CBC -f/u with GI 12/07/2022

## 2022-11-17 NOTE — Progress Notes (Signed)
   CC: blood in stool  HPI:  [name] is a 52 y.o. with history as below who presents for evaluation of bloody stools.  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 US Neck 11/06/14: Bilateral nodules. Dominant right lower pole nodule measures 2.3 cm. Findings meet consensus criteria for biopsy. Ultrasound-guided fine needle aspiration should be considered Aspiration pathology: benign follicular nodule   Review of Systems:  See detailed assessment and plan for pertinent ROS.  Physical Exam:  Vitals:   11/17/22 1448  BP: (!) 147/81  Temp: 98.3 F (36.8 C)  TempSrc: Oral  SpO2: 100%  Weight: 212 lb 6.4 oz (96.3 kg)  Height: '5\' 4"'$  (1.626 m)   Physical Exam Constitutional:      General: She is not in acute distress. HENT:     Head: Normocephalic and atraumatic.  Eyes:     Extraocular Movements: Extraocular movements intact.  Cardiovascular:     Rate and Rhythm: Normal rate and regular rhythm.  Pulmonary:     Effort: Pulmonary effort is normal.     Breath sounds: Normal breath sounds.  Abdominal:     General: Abdomen is flat.     Palpations: Abdomen is soft.     Tenderness: There is abdominal tenderness.  Musculoskeletal:     Cervical back: Neck supple.     Right lower leg: No edema.     Left lower leg: No edema.  Skin:    General: Skin is warm and dry.  Neurological:     Mental Status: She is alert and oriented to person, place, and time.  Psychiatric:        Mood and Affect: Mood normal.         Behavior: Behavior normal.      Assessment & Plan:   See Encounters Tab for problem based charting.  Bloody stool Patient reports that soon after recent OV, she developed bloody stools. Initially with a small clot on wiping. Since that day (~Nov 27), she has had streaks of blood in the stool. Also reports fatigue but denies lightheadedness, falls, dizziness. Reports continued watery diarrhea multiple times a day as well. Stools continue to be dark, which she correlates with ferrous sulfate. She is not hypotensive or tachycardic. Right lower quadrant tenderness on exam. Suspect GI blood loss. Patient has yet to have colonoscopy. Encouraged her to follow up with GI later this month. Will get CBC to follow hgb in the meantime.  -repeat CBC -f/u with GI 12/07/2022   Iron deficiency anemia History of IDA in the setting of likely GI blood loss. Denies falls, dizziness, lightheadedness. Bloody stools for several weeks now.  -repeat CBC -continue ferrous sulfate 325 mg every other day    Patient discussed with Dr. Dareen Piano

## 2022-11-17 NOTE — Patient Instructions (Signed)
Ms.Perle Reise, it was a pleasure seeing you today!  Today we discussed: Bloody stools- we will check your hemoglobin and I will call back with results. Please continue to follow up with GI on 12/07/2022  I have ordered the following labs today:  Lab Orders         CBC no Diff      Tests ordered today:  CBC  Referrals ordered today:   Referral Orders  No referral(s) requested today     I have ordered the following medication/changed the following medications:   Stop the following medications: There are no discontinued medications.   Start the following medications: No orders of the defined types were placed in this encounter.    Follow-up: 2 months   Please make sure to arrive 15 minutes prior to your next appointment. If you arrive late, you may be asked to reschedule.   We look forward to seeing you next time. Please call our clinic at [phone] if you have any questions or concerns. The best time to call is Monday-Friday from 9am-4pm, but there is someone available 24/7. If after hours or the weekend, call the main hospital number and ask for the Internal Medicine Resident On-Call. If you need medication refills, please notify your pharmacy one week in advance and they will send [place] a request.  Thank you for letting us take part in your care. Wishing you the best!  Thank you, Linward Natal, MD

## 2022-11-18 LAB — CBC
Hematocrit: 28.4 % — ABNORMAL LOW (ref 34.0–46.6)
Hemoglobin: 8.6 g/dL — ABNORMAL LOW (ref 11.1–15.9)
MCH: 21.7 pg — ABNORMAL LOW (ref 26.6–33.0)
MCHC: 30.3 g/dL — ABNORMAL LOW (ref 31.5–35.7)
MCV: 72 fL — ABNORMAL LOW (ref 79–97)
Platelets: 396 10*3/uL (ref 150–450)
RBC: 3.96 x10E6/uL (ref 3.77–5.28)
RDW: 18.1 % — ABNORMAL HIGH (ref 11.7–15.4)
WBC: 6.3 10*3/uL (ref 3.4–10.8)

## 2022-11-22 ENCOUNTER — Ambulatory Visit (INDEPENDENT_AMBULATORY_CARE_PROVIDER_SITE_OTHER): Payer: Medicare Other | Admitting: Student

## 2022-11-22 DIAGNOSIS — J069 Acute upper respiratory infection, unspecified: Secondary | ICD-10-CM | POA: Diagnosis not present

## 2022-11-22 NOTE — Progress Notes (Signed)
   CC: Upper respiratory symptoms  This is a telephone encounter between [employer] and [employer] on 11/22/2022 for UR symptoms. The visit was conducted with the patient located at home and [name] at [facility]. The patient's identity was confirmed using their DOB and current address. The patient has consented to being evaluated through a telephone encounter and understands the associated risks (an examination cannot be done and the patient may need to come in for an appointment) / benefits (allows the patient to remain at home, decreasing exposure to coronavirus). I personally spent 13 minutes on medical discussion.   HPI:  [name] is a 52 y.o. with PMH as below.   Please see A&P for assessment of the patient's acute and chronic medical conditions.   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 US Neck 11/06/14: Bilateral nodules. Dominant right lower pole nodule measures 2.3 cm. Findings meet consensus criteria for biopsy. Ultrasound-guided fine needle aspiration should be considered Aspiration pathology: benign follicular nodule   Review of Systems:  + body aches, chills, dry cough, headache, diarrhea - shortness of breath, chest pain    Assessment & Plan:   URI (upper respiratory infection) Assessment: [name] reports 2 days of chills, body aches, a dry cough, head pain, and few episodes of diarrhea. Her son has had similar symptoms. She denies chest pain, shortness of breath, or difficutly  tolerating PO diet. I recommended flu and COVID swabs however she states she is unable to get these today. If she does, encouraged her to call the clinic and we can consider treatment with antiviral therapy.   Plan: -continue conservative management with tylenol, robitussin -follow up in clinic if symptoms worsen or develops fever, shortness of breath    Patient discussed with Dr. Fanny Bien Internal Medicine Resident

## 2022-11-22 NOTE — Assessment & Plan Note (Addendum)
Assessment: [name] reports 2 days of chills, body aches, a dry cough, head pain, and few episodes of diarrhea. Her son has had similar symptoms. She denies chest pain, shortness of breath, or difficutly tolerating PO diet. I recommended flu and COVID swabs however she states she is unable to get these today. If she does, encouraged her to call the clinic and we can consider treatment with antiviral therapy.   Plan: -continue conservative management with tylenol, robitussin -follow up in clinic if symptoms worsen or develops fever, shortness of breath

## 2022-11-25 NOTE — Progress Notes (Signed)
Internal Medicine Clinic Attending  Case discussed with Dr. White  At the time of the visit.  We reviewed the resident's history and exam and pertinent patient test results.  I agree with the assessment, diagnosis, and plan of care documented in the resident's note.  

## 2022-11-28 NOTE — Progress Notes (Signed)
Internal Medicine Clinic Attending  Case discussed with Dr. Katsadouros  At the time of the visit.  We reviewed the resident's history and exam and pertinent patient test results.  I agree with the assessment, diagnosis, and plan of care documented in the resident's note.  

## 2022-12-03 ENCOUNTER — Ambulatory Visit (INDEPENDENT_AMBULATORY_CARE_PROVIDER_SITE_OTHER): Payer: Medicare Other

## 2022-12-03 ENCOUNTER — Other Ambulatory Visit: Payer: Self-pay

## 2022-12-03 VITALS — BP 115/77 | HR 96 | Temp 97.8°F | Ht 64.0 in | Wt 212.1 lb

## 2022-12-03 DIAGNOSIS — R0981 Nasal congestion: Secondary | ICD-10-CM | POA: Diagnosis not present

## 2022-12-03 DIAGNOSIS — Z23 Encounter for immunization: Secondary | ICD-10-CM

## 2022-12-03 DIAGNOSIS — E785 Hyperlipidemia, unspecified: Secondary | ICD-10-CM | POA: Diagnosis not present

## 2022-12-03 DIAGNOSIS — E119 Type 2 diabetes mellitus without complications: Secondary | ICD-10-CM | POA: Diagnosis not present

## 2022-12-03 DIAGNOSIS — I10 Essential (primary) hypertension: Secondary | ICD-10-CM

## 2022-12-03 DIAGNOSIS — Z0000 Encounter for general adult medical examination without abnormal findings: Secondary | ICD-10-CM | POA: Insufficient documentation

## 2022-12-03 MED ORDER — BENZONATATE 100 MG PO CAPS: 100.0000 mg | ORAL_CAPSULE | Freq: Three times a day (TID) | ORAL | 0 refills | Status: DC | PRN

## 2022-12-03 MED ORDER — GUAIFENESIN ER 600 MG PO TB12: 600.0000 mg | ORAL_TABLET | Freq: Two times a day (BID) | ORAL | 1 refills | Status: DC

## 2022-12-03 MED ORDER — NAPROXEN 500 MG PO TABS: 500.0000 mg | ORAL_TABLET | Freq: Two times a day (BID) | ORAL | 0 refills | Status: DC

## 2022-12-03 NOTE — Assessment & Plan Note (Signed)
Influenza vaccine administered today.

## 2022-12-03 NOTE — Assessment & Plan Note (Signed)
Current medications include amLODipine 10 mg daily and carvedilol 6.25 mg BID. Patient states that she is compliant with these medications. Patient states that she does not check her BP regularly at home. Patient denies HA, lightheadedness, dizziness, CP, or SOB. Initial BP today is 115/77.  Plan: - Continue amLODipine 10 mg daily and carvedilol 6.25 mg BID

## 2022-12-03 NOTE — Assessment & Plan Note (Signed)
Current medications include atorvastatin 40 mg daily. Patient states that she is compliant with this medication. Lipid panel from 4 months ago WNL w/ exception of HDL 37. LDL 30.    Plan: - Continue atorvastatin 40 mg daily

## 2022-12-03 NOTE — Assessment & Plan Note (Signed)
Current medications include metFORMIN 1000 mg BID and Victoza 1.8 mg daily. Patient states that she is compliant with these medications. Patient does not check her blood sugar at home regularly. Patient endorses polyuria, polydipsia, and fatigue. Patient states that she does visit the ophthalmologist for yearly eye exams. A1c was 6.9% two months ago.  Plan: - Continue metFORMIN 1000 mg BID and Victoza 1.8 mg daily - Repeat A1c at next visit - F/u ophthalmology for yearly eye exam

## 2022-12-03 NOTE — Progress Notes (Signed)
CC: nasal congestion  HPI:  [name] is a 52 y.o. female with past medical history of HTN, T2DM, HLD, OSA, allergic rhinitis, obesity, and schizoaffective disorder that presents for nasal congestion.   Patient reports cough productive of clearish/whitish sputum, nasal congestion, diffuse muscle aches, and chest soreness over the last week. She attributes her chest soreness to her cough. Patient denies fever or chills. Patient has tried Theraflu and robitussin at home without relief. Patient has tried tylenol and ibuprofen for her chest soreness without relief.   Patient has a history of hypertension. Current medications include amLODipine 10 mg daily and carvedilol 6.25 mg BID. Patient states that she is compliant with these medications. Patient states that she does not check her BP regularly at home. Patient denies HA, lightheadedness, dizziness, CP, or SOB.   Patient also has history of T2DM. Current medications include metFORMIN 1000 mg BID and Victoza 1.8 mg daily. Patient states that she is compliant with these medications. Patient does not check her blood sugar at home regularly. Patient endorses polyuria, polydipsia, and fatigue. Patient states that she does visit the ophthalmologist for yearly eye exams.   Patient has a history of hyperlipidemia. Current medications include atorvastatin 40 mg daily. Patient states that she is compliant with this medication.   Patient is interested in receiving the influenza vaccine today.   Allergies as of 12/03/2022       Reactions   Bactrim [sulfamethoxazole-trimethoprim] Anaphylaxis, Shortness Of Breath, Swelling   Lisinopril Cough   Losartan Cough   Bee Pollen Other (See Comments)   Seasonal allergies   Ivp Dye [iodinated Contrast Media] Nausea And Vomiting   Metrizamide Nausea And Vomiting   Pollen Extract    Seasonal allergies   Sulfa Antibiotics Swelling   Eyes and lips swelled up   Sulfasalazine Swelling   Eyes and lips  swelled up   Other Rash   Peanut-containing Drug Products Rash        Medication List        Accurate as of December 03, 2022  3:05 PM. If you have any questions, ask your nurse or doctor.          amLODipine 10 MG tablet Commonly known as: NORVASC Take 1 tablet (10 mg total) by mouth daily.   atorvastatin 40 MG tablet Commonly known as: LIPITOR Take 1 tablet (40 mg total) by mouth daily.   BD Pen Needle Nano U/F 32G X 4 MM Misc Generic drug: Insulin Pen Needle USE TO INJECT VICTOZA ONCE DAILY   benzonatate 100 MG capsule Commonly known as: Tessalon Perles Take 1 capsule (100 mg total) by mouth 3 (three) times daily as needed for cough. Started by: Starlyn Skeans, MD   carvedilol 6.25 MG tablet Commonly known as: COREG Take 1 tablet (6.25 mg total) by mouth 2 (two) times daily with a meal.   DULoxetine 30 MG capsule Commonly known as: CYMBALTA Take 1 capsule (30 mg total) by mouth daily.   ferrous sulfate 325 (65 FE) MG EC tablet Take 1 tablet (325 mg total) by mouth every other day.   guaiFENesin 600 MG 12 hr tablet Commonly known as: Mucinex Take 1 tablet (600 mg total) by mouth 2 (two) times daily. Started by: Starlyn Skeans, MD   metFORMIN 1000 MG tablet Commonly known as: GLUCOPHAGE Take 1 tablet (1,000 mg total) by mouth 2 (two) times daily.   naproxen 500 MG tablet Commonly known as: Naprosyn Take 1 tablet (500 mg total) by mouth  2 (two) times daily with a meal. Started by: Starlyn Skeans, MD   Victoza 18 MG/3ML Sopn Generic drug: liraglutide Inject 1.8 mg into the skin daily.         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 US Neck 11/06/14: Bilateral nodules. Dominant right lower pole nodule measures 2.3 cm. Findings meet consensus criteria for biopsy. Ultrasound-guided fine needle aspiration should be considered Aspiration pathology: benign follicular nodule   Review of Systems:  per HPI.   Physical Exam: Vitals:   12/03/22 1041  BP: 115/77  Pulse: 96  Temp: 97.8 F (36.6 C)  TempSrc: Oral  SpO2: 100%  Weight: 212 lb 1.6 oz (96.2 kg)  Height: '5\' 4"'$  (1.626 m)   Constitutional: Well-developed, well-nourished, appears comfortable, pleasant, hard of hearing  Cardiovascular: Regular rate, regular rhythm. No murmurs, rubs, or gallops. Normal radial and PT pulses bilaterally. No LE edema.  Pulmonary: Normal respiratory effort. No wheezes, rales, or rhonchi. No crackles.  Abdominal: Soft. Non-distended. No tenderness. Normal bowel sounds.  Musculoskeletal: Normal range of motion.     Neurological: Alert and oriented to person, place, and time. Non-focal. Skin: warm and dry.    Assessment & Plan:   See Encounters Tab for problem based charting.  Patient seen with Dr. Cain Sieve.

## 2022-12-03 NOTE — Patient Instructions (Addendum)
Thank you for coming to see [place] in clinic [name].   Plan: - Please take mucinex as needed for your nasal congestion (we sent you a prescription but you can also get this over the counter if it is not covered by your insurance)  - Please take tessalon perles 100 mg three times daily as needed for your cough  - Please take naproxen 500 mg twice daily as needed for your chest discomfort  - We gave you the influenza vaccine today  It was very nice to see you, thank you for allowing [place] to be involved in your care.

## 2022-12-03 NOTE — Assessment & Plan Note (Signed)
Patient reports cough productive of clearish/whitish sputum, nasal congestion, diffuse muscle aches, and chest soreness over the last week. She attributes her chest soreness to her cough. Patient denies fever or chills. Patient has tried Theraflu and robitussin at home without relief. Patient has tried tylenol and ibuprofen for her chest soreness without relief. No crackles or rales on exam. Afebrile here. Vital signs stable. Low suspicion for pneumonia at this time. Suspect her symptoms are likely viral in nature.   Plan: - Mucinex ordered for congestion - Tessalon perles ordered for cough - Naproxen 500 mg BID ordered for chest discomfort

## 2022-12-06 NOTE — Progress Notes (Signed)
Internal Medicine Clinic Attending  Case discussed with Dr. Mapp  At the time of the visit.  We reviewed the resident's history and exam and pertinent patient test results.  I agree with the assessment, diagnosis, and plan of care documented in the resident's note.  

## 2022-12-07 ENCOUNTER — Encounter: Payer: Self-pay | Admitting: Gastroenterology

## 2022-12-07 ENCOUNTER — Ambulatory Visit (INDEPENDENT_AMBULATORY_CARE_PROVIDER_SITE_OTHER): Payer: Medicare Other | Admitting: Gastroenterology

## 2022-12-07 ENCOUNTER — Other Ambulatory Visit (INDEPENDENT_AMBULATORY_CARE_PROVIDER_SITE_OTHER): Payer: Medicare Other

## 2022-12-07 ENCOUNTER — Telehealth: Payer: Self-pay

## 2022-12-07 VITALS — BP 92/68 | HR 95 | Ht 64.0 in | Wt 212.0 lb

## 2022-12-07 DIAGNOSIS — Z791 Long term (current) use of non-steroidal anti-inflammatories (NSAID): Secondary | ICD-10-CM

## 2022-12-07 DIAGNOSIS — R1013 Epigastric pain: Secondary | ICD-10-CM

## 2022-12-07 DIAGNOSIS — D509 Iron deficiency anemia, unspecified: Secondary | ICD-10-CM

## 2022-12-07 DIAGNOSIS — K219 Gastro-esophageal reflux disease without esophagitis: Secondary | ICD-10-CM | POA: Diagnosis not present

## 2022-12-07 DIAGNOSIS — K529 Noninfective gastroenteritis and colitis, unspecified: Secondary | ICD-10-CM

## 2022-12-07 LAB — HEMOGLOBIN: Hemoglobin: 8.5 g/dL — ABNORMAL LOW (ref 12.0–15.0)

## 2022-12-07 MED ORDER — OMEPRAZOLE 40 MG PO CPDR: 40.0000 mg | DELAYED_RELEASE_CAPSULE | Freq: Every day | ORAL | 1 refills | Status: DC

## 2022-12-07 MED ORDER — PLENVU 140 G PO SOLR: 1.0000 | Freq: Once | ORAL | 0 refills | Status: AC

## 2022-12-07 NOTE — Patient Instructions (Addendum)
If you are age 50 or older, your body mass index should be between 23-30. Your Body mass index is 36.39 kg/m. If this is out of the aforementioned range listed, please consider follow up with your Primary Care Provider.  If you are age 41 or younger, your body mass index should be between 19-25. Your Body mass index is 36.39 kg/m. If this is out of the aformentioned range listed, please consider follow up with your Primary Care Provider.   ________________________________________________________  Please go to the lab in the basement of our building to have lab work done as you leave today. Hit "B" for basement when you get on the elevator.  When the doors open the lab is on your left.  We will call you with the results. Thank you.  You have been scheduled for a colonoscopy. Please follow written instructions given to you at your visit today.  Please pick up your prep supplies at the pharmacy within the next 1-3 days. If you use inhalers (even only as needed), please bring them with you on the day of your procedure.   HOLD ALL NSAID's. Use Tylenol as needed.  We have sent the following medications to your pharmacy for you to pick up at your convenience: Omeprazole 40 mg: Take once daily  We will refer you for IV iron infusions. You will be contacted to schedule an appointment.  Thank you for entrusting me with your care and for choosing [facility], Dr. [place] Cellar

## 2022-12-07 NOTE — Telephone Encounter (Addendum)
-----  Message from Yetta Flock, MD sent at 12/07/2022  9:48 AM EST ----- Regarding: IV Iron [place] can you help coordinate IV iron infusions for this patient? Dr. Loni Muse

## 2022-12-07 NOTE — Progress Notes (Signed)
HPI :  52 year old female Jehovah's Witness, history of diabetes, history of cerebral palsy, iron deficiency, referred here for severe iron deficiency and altered bowel habits.  She is accompanied by her son today.  Chart reviewed, she was admitted in August with severe symptomatic anemia, hemoglobin of 5.6 with severe iron deficiency.  As a Jehov[employer]'s Witness she declined blood transfusion.  She was treated with IV iron infusions which eventually helped with her anemia and was discharged.  Her hemoglobin has remained in the mid eights since that time, MCV in the 70s with ongoing iron deficiency on labs.  She has been on oral iron since that time.  She is status post hysterectomy in 2018.  She states she used to have very heavy menses and son reports anemia related to that at that time.  Her hemoglobin was normal back in 2021.  She reports having loose stools for the past 6 months with intermittent lower abdominal cramping.  She has roughly 2 bowel movements per day.  Typically there is no blood in the stool but she did have 1 episode of bright red blood noted about a month ago.  She has had a normal ESR and CRP in November as well as a negative TTG IgA.  She states her bowel symptoms are stable more recently.  She has occasional reflux that bothers her, no dysphagia.  She does have occasional nausea and vomiting, there is no blood in the emesis from what she reports.  She also has occasional epigastric pain.  On review of her medications it looks like she takes Naprosyn once to twice every day.  She states she takes it for aches and pains but does take it routinely.  She also states she takes ibuprofen as needed for add on aches and pains.  She is really not sure why she takes it every day as she denies any chronic joint pains.  She endorses being fatigued from her anemia.  She has never had an endoscopy or colonoscopy before.  Her mother had breast cancer, grand mother had lung cancer, no family history  of colon cancer or GI tract malignancy that she endorses.  Her hemoglobin has remained in the mid eights since her discharge in August. She had an echocardiogram during her hospitalization which showed an EF of 55% no major abnormalities.  She denies any cardiopulmonary symptoms currently.   Echo 06/24/22: EF 55%   Past Medical History:  Diagnosis Date   Anemia    Congenital cerebral palsy ([facility])    Depression    Diabetes mellitus due to abnormal insulin ([place])    Edema 06/10/2017   Endometriosis    Hearing impairment    Heart murmur    at birth   Hypertension    Neuromuscular disorder (HCC)    Cerebral palsy- very mild left side of brain   Schizoaffective disorder (HCC)    Thyroid nodule 07/30/2014   CT scan from 08/27/2010: A 1.9 cm calcified nodule involving the lower pole of the right lobe of the thyroid gland. US Neck 11/06/14: Bilateral nodules. Dominant right lower pole nodule measures 2.3 cm. Findings meet consensus criteria for biopsy. Ultrasound-guided fine needle aspiration should be considered Aspiration pathology: benign follicular nodule     Past Surgical History:  Procedure Laterality Date   CESAREAN SECTION     EXPLORATORY LAPAROTOMY WITH ABDOMINAL MASS EXCISION     MYOMECTOMY N/A 08/05/2015   Procedure: Abdominal Hysterectomy, Bilateral Salpingectomy;  Surgeon: Osborne Oman, MD;  Location:  [facility];  Service: Gynecology;  Laterality: N/A;   Family History  Problem Relation Age of Onset   Diabetes Mother    Breast cancer Mother    Cancer Mother    Heart failure Mother    Hypertension Mother    Diabetes Mellitus II Mother    Diabetes Mellitus II Father    Hypertension Father    Colon cancer Neg Hx    Esophageal cancer Neg Hx    Pancreatic cancer Neg Hx    Ulcerative colitis Neg Hx    Social History   Tobacco Use   Smoking status: Never   Smokeless tobacco: Never  Vaping Use   Vaping Use: Never used  Substance Use Topics   Alcohol use: No   Drug  use: No   Current Outpatient Medications  Medication Sig Dispense Refill   amLODipine (NORVASC) 10 MG tablet Take 1 tablet (10 mg total) by mouth daily. 30 tablet 11   atorvastatin (LIPITOR) 40 MG tablet Take 1 tablet (40 mg total) by mouth daily. 30 tablet 2   benzonatate (TESSALON PERLES) 100 MG capsule Take 1 capsule (100 mg total) by mouth 3 (three) times daily as needed for cough. 20 capsule 0   carvedilol (COREG) 6.25 MG tablet Take 1 tablet (6.25 mg total) by mouth 2 (two) times daily with a meal. 60 tablet 2   DULoxetine (CYMBALTA) 30 MG capsule Take 1 capsule (30 mg total) by mouth daily. 30 capsule 3   ferrous sulfate 325 (65 FE) MG EC tablet Take 1 tablet (325 mg total) by mouth every other day. 15 tablet 2   guaiFENesin (MUCINEX) 600 MG 12 hr tablet Take 1 tablet (600 mg total) by mouth 2 (two) times daily. 14 tablet 1   Insulin Pen Needle (BD PEN NEEDLE NANO U/F) 32G X 4 MM MISC USE TO INJECT VICTOZA ONCE DAILY 100 each 1   liraglutide (VICTOZA) 18 MG/3ML SOPN Inject 1.8 mg into the skin daily. 9 mL 5   metFORMIN (GLUCOPHAGE) 1000 MG tablet Take 1 tablet (1,000 mg total) by mouth 2 (two) times daily. 180 tablet 3   naproxen (NAPROSYN) 500 MG tablet Take 1 tablet (500 mg total) by mouth 2 (two) times daily with a meal. 30 tablet 0   omeprazole (PRILOSEC) 40 MG capsule Take 1 capsule (40 mg total) by mouth daily. 30 capsule 1   PEG-KCl-NaCl-NaSulf-Na Asc-C (PLENVU) 140 g SOLR Take 1 kit by mouth once for 1 dose. 1 each 0   No current facility-administered medications for this visit.   Allergies  Allergen Reactions   Bactrim [Sulfamethoxazole-Trimethoprim] Anaphylaxis, Shortness Of Breath and Swelling   Lisinopril Cough   Losartan Cough   Bee Pollen Other (See Comments)    Seasonal allergies   Ivp Dye [Iodinated Contrast Media] Nausea And Vomiting   Metrizamide Nausea And Vomiting   Pollen Extract     Seasonal allergies   Sulfa Antibiotics Swelling    Eyes and lips swelled  up   Sulfasalazine Swelling    Eyes and lips swelled up   Other Rash   Peanut-Containing Drug Products Rash     Review of Systems: All systems reviewed and negative except where noted in HPI.    Lab Results  Component Value Date   WBC 6.3 11/17/2022   HGB 8.6 (L) 11/17/2022   HCT 28.4 (L) 11/17/2022   MCV 72 (L) 11/17/2022   PLT 396 11/17/2022       Latest Ref Rng & Units  11/17/2022    4:22 PM 10/01/2022   10:36 AM 07/12/2022   10:00 AM  CBC  WBC 3.4 - 10.8 x10E3/uL 6.3  6.4  6.0   Hemoglobin 11.1 - 15.9 g/dL 8.6  8.7  8.8   Hematocrit 34.0 - 46.6 % 28.4  29.1  30.6   Platelets 150 - 450 x10E3/uL 396  317  576     Lab Results  Component Value Date   IRON 18 (L) 10/01/2022   TIBC 309 10/01/2022   FERRITIN 8 (L) 10/01/2022    Lab Results  Component Value Date   CREATININE 0.58 06/24/2022   BUN <5 (L) 06/24/2022   NA 138 06/24/2022   K 3.5 06/24/2022   CL 107 06/24/2022   CO2 26 06/24/2022    Lab Results  Component Value Date   ALT 12 06/23/2022   AST 13 (L) 06/23/2022   ALKPHOS 53 06/23/2022   BILITOT 0.4 06/23/2022     Physical Exam: BP 92/68   Pulse 95   Ht '5\' 4"'$  (1.626 m)   Wt 212 lb (96.2 kg)   LMP 07/23/2015 (Exact Date)   SpO2 98%   BMI 36.39 kg/m  Constitutional: Pleasant,female in no acute distress. HEENT: Normocephalic and atraumatic. Conjunctivae are pale. No scleral icterus. Cardiovascular: Normal rate, regular rhythm.  Pulmonary/chest: Effort normal and breath sounds normal.  Abdominal: Soft, nondistended, nontender. There are no masses palpable.  Extremities: no edema Lymphadenopathy: No cervical adenopathy noted. Neurological: Alert and oriented to person place and time. Skin: Skin is warm and dry. No rashes noted. Psychiatric: Normal mood and affect. Behavior is normal.   ASSESSMENT: 52 y.o. female here for assessment of the following  1. Iron deficiency anemia, unspecified iron deficiency anemia type   2. Chronic diarrhea    3. Gastroesophageal reflux disease, unspecified whether esophagitis present   4. Abdominal pain, epigastric   5. NSAID long-term use    Patient is status post hysterectomy.  Severe iron deficiency dating back to August leading to hospitalization and IV iron use to improve her hemoglobin as she declines blood transfusion.  Her hemoglobin has remained in the eights on oral iron but has not resolved.  She has no prior evaluation of this.  She does have both upper tract and lower tract symptoms to include reflux, epigastric pain, loose stools, and an episode of rectal bleeding.  She takes NSAIDs routinely from what I can gather from her interview today, at risk for PUD and inflammatory changes of her bowel, recommend she completely stop all NSAIDs and use only Tylenol as needed for aches and pains.  I reviewed differential diagnosis with them for IDA and her symptoms.  If she is truly taking this much NSAIDs concerned about PUD.  Recommending EGD and colonoscopy to further evaluate this issue as well as her bowel symptoms.  I reviewed with these procedures entail, risks and benefits of the procedures and anesthesia and she wants to proceed.  Tentatively scheduled to have this done in 1 week.  I will send her to the lab for hemoglobin today to make sure stable.  I will also refer her for IV iron to see if that will help her anemia and related fatigue.  In the interim we will start her on omeprazole 40 mg once daily and she should avoid all NSAIDs at this time.  Further recommendations pending the results.  She and son agree with the plan as outlined.  PLAN: - EGD and colon in the [facility] -  offered to do this tomorrow for them, which they can't do, scheduled for one week from today - hold all NSAIDs (ibuprofen and naproxen) - use tylenol PRN for aches / pains - start omeprazole '40mg'$  / day  - Hgb today to make sure stable - refer for IV Iron infusions   Jolly Mango, MD [facility]  CC: Axel Filler

## 2022-12-08 ENCOUNTER — Telehealth: Payer: Self-pay | Admitting: Pharmacy Technician

## 2022-12-08 ENCOUNTER — Encounter: Payer: Self-pay | Admitting: Gastroenterology

## 2022-12-08 NOTE — Telephone Encounter (Signed)
Dr. Havery Moros,  Medicare will only cover venofer if the patient has a Dx of CKD.  It will not cover anemia (D50.9).  Feraheme is the preferred med for Dx (D50.9) Would you like to use feraheme?  Auth Submission: NO AUTH NEEDED Payer: medicare a/b Medication & CPT/J Code(s) submitted: Feraheme (ferumoxytol) [id number] Route of submission (phone, fax, portal):  Phone # Fax # Auth type: Buy/Bill Units/visits requested: 2 Reference number:  Approval from: 12/09/22 to 04/09/23

## 2022-12-08 NOTE — Telephone Encounter (Signed)
Yes that is fine, thank you very much

## 2022-12-08 NOTE — Addendum Note (Signed)
Addended by: Yevette Edwards on: 12/08/2022 11:32 AM   Modules accepted: Orders

## 2022-12-08 NOTE — Telephone Encounter (Signed)
Venofer orders x 5 treatments in Epic. [place] [facility] on [address] will contact patient to set up appt after authorization is obtained.

## 2022-12-09 ENCOUNTER — Encounter: Payer: Self-pay | Admitting: Gastroenterology

## 2022-12-09 ENCOUNTER — Other Ambulatory Visit: Payer: Self-pay | Admitting: Pharmacy Technician

## 2022-12-14 ENCOUNTER — Ambulatory Visit (AMBULATORY_SURGERY_CENTER): Payer: Medicare Other | Admitting: Gastroenterology

## 2022-12-14 ENCOUNTER — Telehealth: Payer: Self-pay

## 2022-12-14 ENCOUNTER — Encounter: Payer: Self-pay | Admitting: Gastroenterology

## 2022-12-14 VITALS — BP 126/79 | HR 100 | Temp 97.5°F | Resp 21 | Ht 64.0 in | Wt 212.0 lb

## 2022-12-14 DIAGNOSIS — D123 Benign neoplasm of transverse colon: Secondary | ICD-10-CM

## 2022-12-14 DIAGNOSIS — D509 Iron deficiency anemia, unspecified: Secondary | ICD-10-CM

## 2022-12-14 DIAGNOSIS — D122 Benign neoplasm of ascending colon: Secondary | ICD-10-CM

## 2022-12-14 DIAGNOSIS — K219 Gastro-esophageal reflux disease without esophagitis: Secondary | ICD-10-CM | POA: Diagnosis not present

## 2022-12-14 DIAGNOSIS — K6389 Other specified diseases of intestine: Secondary | ICD-10-CM

## 2022-12-14 DIAGNOSIS — R1013 Epigastric pain: Secondary | ICD-10-CM

## 2022-12-14 DIAGNOSIS — D128 Benign neoplasm of rectum: Secondary | ICD-10-CM

## 2022-12-14 DIAGNOSIS — K529 Noninfective gastroenteritis and colitis, unspecified: Secondary | ICD-10-CM

## 2022-12-14 DIAGNOSIS — D124 Benign neoplasm of descending colon: Secondary | ICD-10-CM

## 2022-12-14 MED ORDER — PREDNISONE 50 MG PO TABS: ORAL_TABLET | ORAL | 0 refills | Status: DC

## 2022-12-14 MED ORDER — DIPHENHYDRAMINE HCL 50 MG PO TABS: ORAL_TABLET | ORAL | 0 refills | Status: DC

## 2022-12-14 MED ORDER — SODIUM CHLORIDE 0.9 % IV SOLN: 500.0000 mL | Freq: Once | INTRAVENOUS | Status: DC

## 2022-12-14 NOTE — Op Note (Signed)
[facility] Patient Name: [name] Procedure Date: 12/14/2022 11:19 AM MRN: [id number] Endoscopist: Remo Lipps P. Havery Moros , MD, [id number] Age: 52 Referring MD:  Date of Birth: 1971-03-29 Gender: Female Account #: [id number] Procedure:                Upper GI endoscopy Indications:              Iron deficiency anemia, suspected gastro-esophageal                            reflux disease, persistent diarrhea - started on                            omeprazole with improvement in upper tract symptoms Medicines:                Monitored Anesthesia Care Procedure:                Pre-Anesthesia Assessment:                           - Prior to the procedure, a History and Physical                            was performed, and patient medications and                            allergies were reviewed. The patient's tolerance of                            previous anesthesia was also reviewed. The risks                            and benefits of the procedure and the sedation                            options and risks were discussed with the patient.                            All questions were answered, and informed consent                            was obtained. Prior Anticoagulants: The patient has                            taken no anticoagulant or antiplatelet agents. ASA                            Grade Assessment: II - A patient with mild systemic                            disease. After reviewing the risks and benefits,                            the patient was deemed in satisfactory condition to  undergo the procedure.                           After obtaining informed consent, the endoscope was                            passed under direct vision. Throughout the                            procedure, the patient's blood pressure, pulse, and                            oxygen saturations were monitored continuously. The                             Endoscope was introduced through the mouth, and                            advanced to the second part of duodenum. The upper                            GI endoscopy was accomplished without difficulty.                            The patient tolerated the procedure well. Scope In: Scope Out: Findings:                 Esophagogastric landmarks were identified: the                            Z-line was found at 38 cm, the gastroesophageal                            junction was found at 38 cm and the upper extent of                            the gastric folds was found at 38 cm from the                            incisors.                           The exam of the esophagus was otherwise normal.                           A few small sessile polyps with no stigmata of                            recent bleeding were found in the gastric body. One                            representative polyp was removed with a cold biopsy  forceps. Resection and retrieval were complete.                           The exam of the stomach was otherwise normal.                           Biopsies were taken with a cold forceps for                            Helicobacter pylori testing.                           The examined duodenum was normal. Biopsies for                            histology were taken with a cold forceps for                            evaluation of celiac disease. Complications:            No immediate complications. Estimated blood loss:                            Minimal. Estimated Blood Loss:     Estimated blood loss was minimal. Impression:               - Esophagogastric landmarks identified.                           - Normal esophagus otherwise                           - A few gastric polyps. Benign appearing. One                            representative polyp resected and retrieved.                           - Normal stomach otherwise - biopsies  taken to rule                            out H pylori                           - Normal examined duodenum. Biopsied. Recommendation:           - Patient has a contact number available for                            emergencies. The signs and symptoms of potential                            delayed complications were discussed with the                            patient. Return to normal activities tomorrow.  Written discharge instructions were provided to the                            patient.                           - Resume previous diet.                           - Continue present medications.                           - Await pathology results.                           - See colonoscopy note for full recommendations Remo Lipps P. Journee Kohen, MD 12/14/2022 12:20:18 PM This report has been signed electronically.

## 2022-12-14 NOTE — Progress Notes (Signed)
Pt's states no medical or surgical changes since previsit or office visit. 

## 2022-12-14 NOTE — Patient Instructions (Addendum)
Resume previous medications, including IV iron Resume previous diet. CT scan to be scheduled, office nurse will call with instructions.  Contrast and instruction sheet given, fill this out when you get the call. Clip cards given for 3 clips that were placed in your colon, these will slough off in a few weeks. Handouts/information given for gastric polyps, colon polyps, colon mass and internal hemorrhoids.  YOU HAD AN ENDOSCOPIC PROCEDURE TODAY AT [facility]:   Refer to the procedure report that was given to you for any specific questions about what was found during the examination.  If the procedure report does not answer your questions, please call your gastroenterologist to clarify.  If you requested that your care partner not be given the details of your procedure findings, then the procedure report has been included in a sealed envelope for you to review at your convenience later.  YOU SHOULD EXPECT: Some feelings of bloating in the abdomen. Passage of more gas than usual.  Walking can help get rid of the air that was put into your GI tract during the procedure and reduce the bloating. If you had a lower endoscopy (such as a colonoscopy or flexible sigmoidoscopy) you may notice spotting of blood in your stool or on the toilet paper. If you underwent a bowel prep for your procedure, you may not have a normal bowel movement for a few days.  Please Note:  You might notice some irritation and congestion in your nose or some drainage.  This is from the oxygen used during your procedure.  There is no need for concern and it should clear up in a day or so.  SYMPTOMS TO REPORT IMMEDIATELY:  Following lower endoscopy (colonoscopy):  Excessive amounts of blood in the stool  Significant tenderness or worsening of abdominal pains  Swelling of the abdomen that is new, acute  Fever of 100F or higher Following upper endoscopy (EGD)  Vomiting of blood or coffee ground material  New chest  pain or pain under the shoulder blades  Painful or persistently difficult swallowing  New shortness of breath  Black, tarry-looking stools For urgent or emergent issues, a gastroenterologist can be reached at any hour by calling [phone]. Do not use MyChart messaging for urgent concerns.   DIET:  We do recommend a small meal at first, but then you may proceed to your regular diet.  Drink plenty of fluids but you should avoid alcoholic beverages for 24 hours.  ACTIVITY:  You should plan to take it easy for the rest of today and you should NOT DRIVE or use heavy machinery until tomorrow (because of the sedation medicines used during the test).    FOLLOW UP: Our staff will call the number listed on your records the next business day following your procedure.  We will call around 7:15- 8:00 am to check on you and address any questions or concerns that you may have regarding the information given to you following your procedure. If we do not reach you, we will leave a message.     If any biopsies were taken you will be contacted by phone or by letter within the next 1-3 weeks.  Please call us at [phone] if you have not heard about the biopsies in 3 weeks.    SIGNATURES/CONFIDENTIALITY: You and/or your care partner have signed paperwork which will be entered into your electronic medical record.  These signatures attest to the fact that that the information above on your After  Visit Summary has been reviewed and is understood.  Full responsibility of the confidentiality of this discharge information lies with you and/or your care-partner.

## 2022-12-14 NOTE — Telephone Encounter (Signed)
CT order in epic. Urgent secure staff message sent to radiology scheduling to contact patient ASAP to schedule CT scan. Pt is allergic to IV contrast. Benadryl and Prednisone sent to pharmacy. Pre-med instructions sent to patient via MyChart.  Urgent referral, records, pt's demographic, and insurance information have been faxed to [facility] at [phone].

## 2022-12-14 NOTE — Telephone Encounter (Signed)
-----  Message from Yetta Flock, MD sent at 12/14/2022  4:08 PM EST ----- Regarding: CT scan and surgery referral [name] this patient had a colonic mass on colonoscopy today and I think very likely malignant. Can you help schedule her for a CT Scan chest / abdomen / pelvis for staging, and refer her to colorectal surgery at [facility] for colon mass? I think best to get scheduled now, the path was sent rush and should be back in next 48 hours. Thanks  Dr. Havery Moros

## 2022-12-14 NOTE — Op Note (Signed)
[facility] Patient Name: [name] Procedure Date: 12/14/2022 11:19 AM MRN: [id number] Endoscopist: Remo Lipps P. Havery Moros , MD, [id number] Age: 52 Referring MD:  Date of Birth: 04-27-1971 Gender: Female Account #: [id number] Procedure:                Colonoscopy Indications:              Iron deficiency anemia, patient also has chronic                            loose stools. Patient had required IV iron to                            maintain hemoglobin around 8s. 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 Medicines:                Monitored Anesthesia Care Procedure:                Pre-Anesthesia Assessment:                           - Prior to the procedure, a History and Physical                            was performed, and patient medications and                            allergies were reviewed. The patient's tolerance of                            previous anesthesia was also reviewed. The risks                            and benefits of the procedure and the sedation                            options and risks were discussed with the patient.                            All questions were answered, and informed consent                            was obtained. Prior Anticoagulants: The patient has                            taken no anticoagulant or antiplatelet agents. ASA                            Grade Assessment: II - A patient with mild systemic                            disease. After reviewing the risks and benefits,  the patient was deemed in satisfactory condition to                            undergo the procedure.                           After obtaining informed consent, the colonoscope                            was passed under direct vision. Throughout the                            procedure, the patient's blood pressure, pulse, and                            oxygen  saturations were monitored continuously. The                            Olympus PCF-H190DL ([id number]) Colonoscope was                            introduced through the anus and advanced to the the                            cecum, identified by appendiceal orifice and                            ileocecal valve. The colonoscopy was performed                            without difficulty. The patient tolerated the                            procedure well. The quality of the bowel                            preparation was good. The ileocecal valve,                            appendiceal orifice, and rectum were photographed. Scope In: 11:36:29 AM Scope Out: 12:14:35 PM Scope Withdrawal Time: 0 hours 29 minutes 55 seconds  Total Procedure Duration: 0 hours 38 minutes 6 seconds  Findings:                 The perianal and digital rectal examinations were                            normal.                           A 15 to 20 mm polyp was found in the ascending                            colon. The polyp was pedunculated. The polyp was  removed with a hot snare. Resection and retrieval                            were complete. To prevent bleeding after the                            polypectomy, and in light of her religious beliefs                            to decline blood transfusion in the setting of                            bleeding, two hemostatic clips were successfully                            placed.                           A sessile large mass was found in the suspected                            proximal transverse colon. The mass was partially                            circumferential (involving one-half of the lumen                            circumference). Biopsies were taken with a cold                            forceps for histology. Area distal to the mass was                            tattooed with an injection of Spot (carbon black).                            A 15 to 20 mm polyp was found in the descending                            colon. The polyp was pedunculated. The polyp was                            removed with a hot snare. Resection and retrieval                            were complete. To prevent bleeding after the                            polypectomy, and in light of her religious beliefs                            to decline blood transfusion in the setting of  bleeding / anemia, one hemostatic clip was                            successfully placed.                           A 5 mm polyp was found in the recto-sigmoid colon.                            The polyp was sessile. The polyp was removed with a                            cold snare. Resection and retrieval were complete.                           Internal hemorrhoids were found during retroflexion.                           The exam was otherwise without abnormality.                           Biopsies for histology were taken with a cold                            forceps for evaluation of microscopic colitis. Complications:            No immediate complications. Estimated blood loss:                            Minimal. Estimated Blood Loss:     Estimated blood loss was minimal. Impression:               - One 15 to 20 mm polyp in the ascending colon,                            removed with a hot snare. Resected and retrieved.                            Clips were placed.                           - Likely malignant tumor in the proximal transverse                            colon. Biopsied. Tattooed.                           - One 15 to 20 mm polyp in the descending colon,                            removed with a hot snare. Resected and retrieved.                            Clip was placed.                           -  One 5 mm polyp at the recto-sigmoid colon,                            removed with a cold snare.  Resected and retrieved.                           - Internal hemorrhoids.                           - The examination was otherwise normal.                           - Biopsies were taken with a cold forceps for                            evaluation of microscopic colitis.                           Unfortunately, lesion in the proximal transverse                            colon is very likely colon cancer and the cause of                            her iron deficiency. Recommendation:           - Patient has a contact number available for                            emergencies. The signs and symptoms of potential                            delayed complications were discussed with the                            patient. Return to normal activities tomorrow.                            Written discharge instructions were provided to the                            patient.                           - Resume previous diet.                           - Continue present medications.                           - Continue IV iron                           - Await pathology results. Specimen sent rush                           -  CT scan chest / abdomen / pelvis for staging once                            pathology back, our office will coordinate                           - Surgical consultation, our office will coordinate Carlota Raspberry. Migel Hannis, MD 12/14/2022 12:29:43 PM This report has been signed electronically.

## 2022-12-14 NOTE — Progress Notes (Signed)
To pacu, VSS. Report to [email or web address].tb 

## 2022-12-14 NOTE — Progress Notes (Signed)
History and Physical Interval Note: Patient seen in the office 12/07/22 - no interval changes. Counseled to stop all NSAIDs, started omeprazole '40mg'$  / day. Here for EGD and colonoscopy to evaluate IDA in light of her other symptoms, she is s/p hysterectomy. She is feeling better in regards to her GERD since being on omeprazole. Otherwise no new changes. No cardiopulmonary symptoms. She wishes to proceed today as outlined.    12/14/2022 11:16 AM  [name]  has presented today for endoscopic procedure(s), with the diagnosis of  Encounter Diagnoses  Name Primary?   Iron deficiency anemia, unspecified iron deficiency anemia type Yes   Chronic diarrhea    Gastroesophageal reflux disease, unspecified whether esophagitis present   .  The various methods of evaluation and treatment have been discussed with the patient and/or family. After consideration of risks, benefits and other options for treatment, the patient has consented to  the endoscopic procedure(s).   The patient's history has been reviewed, patient examined, no change in status, stable for surgery.  I have reviewed the patient's chart and labs.  Questions were answered to the patient's satisfaction.    Jolly Mango, MD [facility]

## 2022-12-15 ENCOUNTER — Ambulatory Visit (INDEPENDENT_AMBULATORY_CARE_PROVIDER_SITE_OTHER): Payer: Medicare Other

## 2022-12-15 ENCOUNTER — Telehealth: Payer: Self-pay | Admitting: *Deleted

## 2022-12-15 VITALS — BP 103/71 | HR 79 | Temp 98.3°F | Resp 18 | Ht 64.0 in | Wt 211.8 lb

## 2022-12-15 DIAGNOSIS — D509 Iron deficiency anemia, unspecified: Secondary | ICD-10-CM

## 2022-12-15 MED ORDER — DIPHENHYDRAMINE HCL 25 MG PO CAPS
25.0000 mg | ORAL_CAPSULE | Freq: Once | ORAL | Status: AC
  Administered 2022-12-15: 25 mg via ORAL
  Filled 2022-12-15: qty 1

## 2022-12-15 MED ORDER — SODIUM CHLORIDE 0.9 % IV SOLN
510.0000 mg | Freq: Once | INTRAVENOUS | Status: AC
  Administered 2022-12-15: 510 mg via INTRAVENOUS
  Filled 2022-12-15: qty 17

## 2022-12-15 MED ORDER — ACETAMINOPHEN 325 MG PO TABS
650.0000 mg | ORAL_TABLET | Freq: Once | ORAL | Status: AC
  Administered 2022-12-15: 650 mg via ORAL
  Filled 2022-12-15: qty 2

## 2022-12-15 NOTE — Telephone Encounter (Signed)
Called and spoke with patient's son Chrystine Oiler. I informed him that he should be expecting a call to schedule patient's CT scan and [facility] appt. Chrystine Oiler has been advised that I have sent RX for Prednisone and Benadryl to patient's pharmacy on file. Chrystine Oiler is aware that pre-medication instructions have been sent to patient's MyChart and she will need to review once her CT scan has been scheduled. Ontre verbalized understanding and had no concerns at the end of the call.

## 2022-12-15 NOTE — Progress Notes (Signed)
Diagnosis: Iron Deficiency Anemia  Provider:  Marshell Garfinkel MD  Procedure: Infusion  IV Type: Peripheral, IV Location: L Antecubital  Feraheme (Ferumoxytol), Dose: 510 mg  Infusion Start Time: 1010  Infusion Stop Time: 7981  Post Infusion IV Care: Observation period completed and Peripheral IV Discontinued  Discharge: Condition: Good, Destination: Home . AVS provided to patient.   Performed by:  Arnoldo Morale, RN

## 2022-12-15 NOTE — Telephone Encounter (Signed)
  Follow up Call-     12/14/2022   10:27 AM  Call back number  Post procedure Call Back phone  # [phone]  Permission to leave phone message Yes     Patient questions:  Do you have a fever, pain , or abdominal swelling? No. Pain Score  0 *  Have you tolerated food without any problems? Yes.    Have you been able to return to your normal activities? Yes.    Do you have any questions about your discharge instructions: Diet   No. Medications  No. Follow up visit  No.  Do you have questions or concerns about your Care? No.  Actions: * If pain score is 4 or above: No action needed, pain <4.

## 2022-12-16 ENCOUNTER — Other Ambulatory Visit: Payer: Self-pay

## 2022-12-16 DIAGNOSIS — D509 Iron deficiency anemia, unspecified: Secondary | ICD-10-CM

## 2022-12-16 DIAGNOSIS — K6389 Other specified diseases of intestine: Secondary | ICD-10-CM

## 2022-12-16 DIAGNOSIS — C189 Malignant neoplasm of colon, unspecified: Secondary | ICD-10-CM

## 2022-12-16 DIAGNOSIS — C801 Malignant (primary) neoplasm, unspecified: Secondary | ICD-10-CM

## 2022-12-21 ENCOUNTER — Ambulatory Visit (HOSPITAL_COMMUNITY)
Admission: RE | Admit: 2022-12-21 | Discharge: 2022-12-21 | Disposition: A | Discharge status: Home / Self Care | Payer: Medicare Other | Source: Ambulatory Visit | Attending: Gastroenterology | Admitting: Gastroenterology

## 2022-12-21 DIAGNOSIS — R1013 Epigastric pain: Secondary | ICD-10-CM | POA: Diagnosis present

## 2022-12-21 DIAGNOSIS — E119 Type 2 diabetes mellitus without complications: Secondary | ICD-10-CM | POA: Insufficient documentation

## 2022-12-21 DIAGNOSIS — K6389 Other specified diseases of intestine: Secondary | ICD-10-CM | POA: Insufficient documentation

## 2022-12-21 DIAGNOSIS — D509 Iron deficiency anemia, unspecified: Secondary | ICD-10-CM | POA: Diagnosis present

## 2022-12-21 LAB — POCT I-STAT CREATININE: Creatinine, Ser: 0.5 mg/dL (ref 0.44–1.00)

## 2022-12-21 MED ORDER — IOHEXOL 300 MG/ML  SOLN
100.0000 mL | Freq: Once | INTRAMUSCULAR | Status: AC | PRN
  Administered 2022-12-21: 100 mL via INTRAVENOUS

## 2022-12-21 MED ORDER — SODIUM CHLORIDE (PF) 0.9 % IJ SOLN
INTRAMUSCULAR | Status: AC
  Filled 2022-12-21: qty 50

## 2022-12-22 ENCOUNTER — Ambulatory Visit (INDEPENDENT_AMBULATORY_CARE_PROVIDER_SITE_OTHER): Payer: Medicare Other

## 2022-12-22 ENCOUNTER — Other Ambulatory Visit: Payer: Self-pay

## 2022-12-22 ENCOUNTER — Telehealth: Payer: Self-pay | Admitting: Hematology

## 2022-12-22 VITALS — BP 120/78 | HR 100 | Temp 98.4°F | Resp 18 | Ht 64.0 in | Wt 211.0 lb

## 2022-12-22 DIAGNOSIS — D509 Iron deficiency anemia, unspecified: Secondary | ICD-10-CM

## 2022-12-22 DIAGNOSIS — K6389 Other specified diseases of intestine: Secondary | ICD-10-CM

## 2022-12-22 DIAGNOSIS — N83202 Unspecified ovarian cyst, left side: Secondary | ICD-10-CM

## 2022-12-22 MED ORDER — SODIUM CHLORIDE 0.9 % IV SOLN
510.0000 mg | Freq: Once | INTRAVENOUS | Status: AC
  Administered 2022-12-22: 510 mg via INTRAVENOUS
  Filled 2022-12-22: qty 17

## 2022-12-22 MED ORDER — DIPHENHYDRAMINE HCL 25 MG PO CAPS
25.0000 mg | ORAL_CAPSULE | Freq: Once | ORAL | Status: AC
  Administered 2022-12-22: 25 mg via ORAL
  Filled 2022-12-22: qty 1

## 2022-12-22 MED ORDER — ACETAMINOPHEN 325 MG PO TABS
650.0000 mg | ORAL_TABLET | Freq: Once | ORAL | Status: AC
  Administered 2022-12-22: 650 mg via ORAL
  Filled 2022-12-22: qty 2

## 2022-12-22 NOTE — Progress Notes (Signed)
Diagnosis: Iron Deficiency Anemia  Provider:  Marshell Garfinkel MD  Procedure: Infusion  IV Type: Peripheral, IV Location: L Antecubital  Feraheme (Ferumoxytol), Dose: 510 mg  Infusion Start Time: 3406  Infusion Stop Time: 8403  Post Infusion IV Care: Peripheral IV Discontinued  Discharge: Condition: Good, Destination: Home . AVS Provided  Performed by:  Arnoldo Morale, RN

## 2022-12-22 NOTE — Telephone Encounter (Signed)
Scheduled appt per 2/7 referral. Pt is aware of appt date and time. Pt is aware to arrive 15 mins prior to appt time and to bring and updated insurance card. Pt is aware of appt location.   

## 2022-12-22 NOTE — Addendum Note (Signed)
Addended by: Yevette Edwards on: 12/22/2022 12:00 PM   Modules accepted: Orders

## 2022-12-26 DIAGNOSIS — C184 Malignant neoplasm of transverse colon: Secondary | ICD-10-CM | POA: Insufficient documentation

## 2022-12-26 DIAGNOSIS — C183 Malignant neoplasm of hepatic flexure: Secondary | ICD-10-CM | POA: Insufficient documentation

## 2022-12-26 NOTE — Progress Notes (Unsigned)
[id number]

## 2022-12-27 ENCOUNTER — Encounter: Payer: Self-pay | Admitting: Hematology

## 2022-12-27 ENCOUNTER — Inpatient Hospital Stay (HOSPITAL_BASED_OUTPATIENT_CLINIC_OR_DEPARTMENT_OTHER): Payer: Medicare Other | Admitting: Hematology

## 2022-12-27 ENCOUNTER — Other Ambulatory Visit: Payer: Self-pay

## 2022-12-27 ENCOUNTER — Inpatient Hospital Stay: Payer: Medicare Other | Attending: Hematology

## 2022-12-27 VITALS — BP 125/79 | HR 102 | Temp 98.5°F | Resp 18 | Ht 64.0 in | Wt 211.8 lb

## 2022-12-27 DIAGNOSIS — D630 Anemia in neoplastic disease: Secondary | ICD-10-CM | POA: Diagnosis not present

## 2022-12-27 DIAGNOSIS — C184 Malignant neoplasm of transverse colon: Secondary | ICD-10-CM

## 2022-12-27 DIAGNOSIS — D500 Iron deficiency anemia secondary to blood loss (chronic): Secondary | ICD-10-CM

## 2022-12-27 DIAGNOSIS — E611 Iron deficiency: Secondary | ICD-10-CM

## 2022-12-27 LAB — COMPREHENSIVE METABOLIC PANEL
ALT: 6 U/L (ref 0–44)
AST: 8 U/L — ABNORMAL LOW (ref 15–41)
Albumin: 3.8 g/dL (ref 3.5–5.0)
Alkaline Phosphatase: 72 U/L (ref 38–126)
Anion gap: 4 — ABNORMAL LOW (ref 5–15)
BUN: 5 mg/dL — ABNORMAL LOW (ref 6–20)
CO2: 32 mmol/L (ref 22–32)
Calcium: 8.8 mg/dL — ABNORMAL LOW (ref 8.9–10.3)
Chloride: 105 mmol/L (ref 98–111)
Creatinine, Ser: 0.54 mg/dL (ref 0.44–1.00)
GFR, Estimated: >60 mL/min (ref >60)
Glucose, Bld: 118 mg/dL — ABNORMAL HIGH (ref 70–99)
Potassium: 3.3 mmol/L — ABNORMAL LOW (ref 3.5–5.1)
Sodium: 141 mmol/L (ref 135–145)
Total Bilirubin: 0.3 mg/dL (ref 0.3–1.2)
Total Protein: 6.7 g/dL (ref 6.5–8.1)

## 2022-12-27 LAB — IRON AND IRON BINDING CAPACITY (CC-WL,HP ONLY)
Iron: 69 ug/dL (ref 28–170)
Saturation Ratios: 24 % (ref 10.4–31.8)
TIBC: 294 ug/dL (ref 250–450)
UIBC: 225 ug/dL (ref 148–442)

## 2022-12-27 NOTE — Progress Notes (Unsigned)
[name]   [phone] Fax:([phone]) [place] Note   Patient Care Team: Nani Gasser, MD as PCP - General Shirley Muscat, Loreen Freud, MD as Referring Physician (Optometry) Truitt Merle, MD as Consulting Physician (Oncology)  Date of Service:  12/27/2022  CHIEF COMPLAINTS/PURPOSE OF CONSULTATION:  Cancer of Transverse Colon  REFERRING PHYSICIAN:  Yetta Flock, MD  ASSESSMENT & PLAN:  [name] is a 52 y.o.  female with a history of Jehovah's Witness, tyoe 2 diabetes, history of cerebral palsy, iron deficiency   1.  Cancer of the transverse colon -Patient presents with iron deficient anemia since 06/2022, and received IV iron and EPO in hospital, she was referred to GI at that time. -She finally had colonoscopy and the EGD on December 14, 2022, which showed a large mass in proximal transverse colon, and a biopsy confirmed adenocarcinoma.  I discussed the findings with her in detail. -Her staging CT scan will showed a large ovarian cyst, and bilateral adrenal gland nodules, no other definitive evidence of node or distant metastasis -She is scheduled for pelvic ultrasound in 2 days to evaluate her ovarian lesion.  If [place] is suspicious for metastatic cancer, I will obtain a PET scan.  If negative, I will order CT of abdomen and pelvis with adrenal protocol to evaluate her adrenal nodules. -She is scheduled to see colorectal surgeon Dr. Johney Maine tomorrow -We discussed that surgery will be recommended if her work up is negative for metastatic colon cancer. -I discussed the role of adjuvant chemotherapy for T4 or node positive disease.  Although she has multiple medical problems, she has good functional status and preserved organ function, would be a candidate for chemotherapy if needed. -I will see her after surgery.  2.  Iron deficient anemia -Secondary to her colon cancer. 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   PLAN:  -Discuss her lab, images and biopsy results  -Dependent on her pelvic ultrasound findings, I will order a PET scan or CT adrenal protocol to rule out metastatic disease. We will call her with results  -She is scheduled to see colorectal surgeon Dr. Johney Maine tomorrow -referral Genetic  Testing  Oncology History  Cancer of transverse colon ([place])  12/21/2022 Imaging    IMPRESSION: 1. Colon is minimally distended and no discrete colonic lesion is identified to correspond with patient's given history of colonic neoplasm. 2. Bilateral adrenal nodules measuring 12 mm in the right adrenal gland and 1 cm in the left adrenal gland are new from CT August 20 2015. These likely reflect benign adrenal adenomas however they are technically indeterminate and small metastatic lesions are not excluded, consider more definitive characterization with pre and postcontrast adrenal protocol CT or MRI versus attention on follow-up oncologic imaging. 3. Tiny bilateral pulmonary nodules along the major fissures measuring up to 2 mm are nonspecific but favored to reflect intrapulmonary lymph nodes. However, given lack of prior imaging for comparison these warrant attention on short-term interval follow-up dedicated chest CT. 4. 7.7 cm left ovarian cyst, recommend further evaluation by dedicated pelvic ultrasound. 5. Nonobstructive bilateral renal stones measure up to 4 mm in the right kidney.   12/26/2022 Initial Diagnosis   Cancer of transverse colon ([place])      HISTORY OF PRESENTING ILLNESS:  [name] 52 y.o. female  with past medical history of diabetes, cerebral palsy, iron deficient anemia, Jehovah's Witness, is referred to [place] because of her newly diagnosed colon cancer.  The patient was referred by GI Dr. Havery Moros. The patient presents to the clinic today  accompanied by her sister.   She presented with symptomatic anemia to hospital in August 2023, with fatigue, and dyspnea on exertion.  Her hemoglobin was 5.6 with severe iron deficiency on admission.  She is a Sales promotion account executive Witness, so she received IV iron and ESA.  She was discharged, and was referred to GI.  She has been on oral iron since then.  She did not see GI until December 04 3222, and proceed with EGD and colonoscopy on December 14, 2022, which showed multiple polyps in colon, and a large mass in proximal transverse colon.  Biopsy of the colon mass showed adenocarcinoma.  She received additional 2 doses of Feraheme in the past 2 weeks.  Today the Pt states she feels like something is pulling in her stomach lower since her colonoscopy. Pt states she feels the pain everyday intermittent . Pt states she has diarrhea twice a day. Pt reports it started last week. Pt is able to take care of herself and she lives with her son.  Family history of malignancy Mother-(Breast Cancer) Grandmother(lung cancer) and maternal aunt  (breast cancer)  Socially... Divorce 1 son   REVIEW OF SYSTEMS:    Constitutional: Denies fevers, chills or abnormal night sweats Eyes: Denies blurriness of vision, double vision or watery eyes Ears, nose, mouth, throat, and face: Denies mucositis or sore throat Respiratory: Denies cough, dyspnea or wheezes Cardiovascular: Denies palpitation, chest discomfort or lower extremity swelling Gastrointestinal:  Denies nausea, heartburn or change in bowel habits Skin: Denies abnormal skin rashes Lymphatics: Denies new lymphadenopathy or easy bruising Neurological:Denies numbness, tingling or new weaknesses Behavioral/Psych: Depression Mood is stable, no new changes     All other systems were reviewed with the patient and are negative.   MEDICAL HISTORY:  Past Medical History:  Diagnosis Date   Anemia    Congenital cerebral palsy (HCC)    Depression    Diabetes mellitus  due to abnormal insulin ([place])    Edema 06/10/2017   Endometriosis    Hearing impairment    Heart murmur    at birth   Hypertension    Neuromuscular disorder ([place])    Cerebral palsy- very mild left side of brain   Schizoaffective disorder (HCC)    Thyroid nodule 07/30/2014   CT scan from 08/27/2010: A 1.9 cm calcified nodule involving the lower pole of the right lobe of the thyroid gland. US Neck 11/06/14: Bilateral nodules. Dominant right lower pole nodule measures 2.3 cm. Findings meet consensus criteria for biopsy. Ultrasound-guided fine needle aspiration should be considered Aspiration pathology: benign follicular nodule    SURGICAL HISTORY: Past Surgical History:  Procedure Laterality Date   CESAREAN SECTION     EXPLORATORY LAPAROTOMY WITH ABDOMINAL MASS EXCISION     MYOMECTOMY N/A 08/05/2015   Procedure: Abdominal Hysterectomy, Bilateral Salpingectomy;  Surgeon: Osborne Oman, MD;  Location: [facility];  Service: Gynecology;  Laterality: N/A;    SOCIAL HISTORY: Social History   Socioeconomic History   Marital status: Divorced    Spouse name: Not on file   Number of children: 1   Years of education: Not on file   Highest education level: Not on file  Occupational History   Not on file  Tobacco Use   Smoking status: Never  Smokeless tobacco: Never  Vaping Use   Vaping Use: Never used  Substance and Sexual Activity   Alcohol use: No   Drug use: No   Sexual activity: Not Currently    Birth control/protection: Surgical  Other Topics Concern   Not on file  Social History Narrative   Not on file   Social Determinants of Health   Financial Resource Strain: Low Risk  (04/16/2022)   Overall Financial Resource Strain (CARDIA)    Difficulty of Paying Living Expenses: Not hard at all  Food Insecurity: No Food Insecurity (12/03/2022)   Hunger Vital Sign    Worried About Running Out of Food in the Last Year: Never true    Ran Out of Food in the Last Year: Never true   Transportation Needs: Unmet Transportation Needs (12/03/2022)   PRAPARE - Transportation    Lack of Transportation (Medical): No    Lack of Transportation (Non-Medical): Yes  Physical Activity: Inactive (04/16/2022)   Exercise Vital Sign    Days of Exercise per Week: 0 days    Minutes of Exercise per Session: 0 min  Stress: No Stress Concern Present (04/16/2022)   [place]    Feeling of Stress : Only a little  Social Connections: Moderately Isolated (12/03/2022)   Social Connection and Isolation Panel [NHANES]    Frequency of Communication with Friends and Family: More than three times a week    Frequency of Social Gatherings with Friends and Family: More than three times a week    Attends Religious Services: More than 4 times per year    Active Member of [employer] or Organizations: No    Attends Archivist Meetings: Never    Marital Status: Separated  Intimate Partner Violence: Not At Risk (12/03/2022)   Humiliation, Afraid, Rape, and Kick questionnaire    Fear of Current or Ex-Partner: No    Emotionally Abused: No    Physically Abused: No    Sexually Abused: No    FAMILY HISTORY: Family History  Problem Relation Age of Onset   Diabetes Mother    Breast cancer Mother    Cancer Mother 21       breast cancer   Heart failure Mother    Hypertension Mother    Diabetes Mellitus II Mother    Cancer Father 21       colon cancer   Diabetes Mellitus II Father    Hypertension Father    Cancer Maternal Aunt 32       breast cancer   Cancer Maternal Grandmother        lung cancer   Colon cancer Neg Hx    Esophageal cancer Neg Hx    Pancreatic cancer Neg Hx    Ulcerative colitis Neg Hx     ALLERGIES:  is allergic to bactrim [sulfamethoxazole-trimethoprim], lisinopril, losartan, bee pollen, ivp dye [iodinated contrast media], metrizamide, pollen extract, sulfa antibiotics, sulfasalazine, other, and  peanut-containing drug products.  MEDICATIONS:  Current Outpatient Medications  Medication Sig Dispense Refill   amLODipine (NORVASC) 10 MG tablet Take 1 tablet (10 mg total) by mouth daily. 30 tablet 11   atorvastatin (LIPITOR) 40 MG tablet Take 1 tablet (40 mg total) by mouth daily. 30 tablet 2   benzonatate (TESSALON PERLES) 100 MG capsule Take 1 capsule (100 mg total) by mouth 3 (three) times daily as needed for cough. 20 capsule 0   carvedilol (COREG) 6.25 MG tablet Take 1 tablet (6.25 mg  total) by mouth 2 (two) times daily with a meal. 60 tablet 2   diphenhydrAMINE (BENADRYL) 50 MG tablet Take 50 mg of Benadryl 1 hour prior to your CT scan 1 tablet 0   DULoxetine (CYMBALTA) 30 MG capsule Take 1 capsule (30 mg total) by mouth daily. 30 capsule 3   ferrous sulfate 325 (65 FE) MG EC tablet Take 1 tablet (325 mg total) by mouth every other day. 15 tablet 2   guaiFENesin (MUCINEX) 600 MG 12 hr tablet Take 1 tablet (600 mg total) by mouth 2 (two) times daily. 14 tablet 1   Insulin Pen Needle (BD PEN NEEDLE NANO U/F) 32G X 4 MM MISC USE TO INJECT VICTOZA ONCE DAILY 100 each 1   liraglutide (VICTOZA) 18 MG/3ML SOPN Inject 1.8 mg into the skin daily. 9 mL 5   metFORMIN (GLUCOPHAGE) 1000 MG tablet Take 1 tablet (1,000 mg total) by mouth 2 (two) times daily. 180 tablet 3   naproxen (NAPROSYN) 500 MG tablet Take 1 tablet (500 mg total) by mouth 2 (two) times daily with a meal. 30 tablet 0   omeprazole (PRILOSEC) 40 MG capsule Take 1 capsule (40 mg total) by mouth daily. 30 capsule 1   predniSONE (DELTASONE) 50 MG tablet Take (1) 50 mg tablet of prednisone 13 hours prior to your procedure , Take (1) 50 mg tablet 7 hours prior to your procedure , and Take (1) 50 mg tablet 1 hour prior to your procedure 3 tablet 0   No current facility-administered medications for this visit.    PHYSICAL EXAMINATION: ECOG PERFORMANCE STATUS: 1 - Symptomatic but completely ambulatory  Vitals:   12/27/22 1512  BP:  125/79  Pulse: (!) 102  Resp: 18  Temp: 98.5 F (36.9 C)  SpO2: 100%   Filed Weights   12/27/22 1512  Weight: 211 lb 12.8 oz (96.1 kg)    GENERAL:alert, no distress and comfortable SKIN: skin color, texture, turgor are normal, no rashes or significant lesions EYES: normal, Conjunctiva are pink and non-injected, sclera clear NECK: (-)supple, thyroid normal size, non-tender, without nodularity LYMPH: (-)  no palpable lymphadenopathy in the cervical, axillary  LUNGS: (-) clear to auscultation and percussion with normal breathing effort HEART: (-) regular rate & rhythm and no murmurs and no lower extremity edema ABDOMEN:abdomen soft, (+) tender in mid abdomen, normal bowel sounds   LABORATORY DATA:  I have reviewed the data as listed    Latest Ref Rng & Units 12/07/2022    9:49 AM 11/17/2022    4:22 PM 10/01/2022   10:36 AM  CBC  WBC 3.4 - 10.8 x10E3/uL  6.3  6.4   Hemoglobin 12.0 - 15.0 g/dL 8.5 Repeated and verified X2.  8.6  8.7   Hematocrit 34.0 - 46.6 %  28.4  29.1   Platelets 150 - 450 x10E3/uL  396  317        Latest Ref Rng & Units 12/27/2022    4:12 PM 12/21/2022    3:22 PM 06/24/2022    4:19 AM  CMP  Glucose 70 - 99 mg/dL 118   104   BUN 6 - 20 mg/dL 5   <5   Creatinine 0.44 - 1.00 mg/dL 0.54  0.50  0.58   Sodium 135 - 145 mmol/L 141   138   Potassium 3.5 - 5.1 mmol/L 3.3   3.5   Chloride 98 - 111 mmol/L 105   107   CO2 22 - 32 mmol/L 32  26   Calcium 8.9 - 10.3 mg/dL 8.8   8.7   Total Protein 6.5 - 8.1 g/dL 6.7     Total Bilirubin 0.3 - 1.2 mg/dL 0.3     Alkaline Phos 38 - 126 U/L 72     AST 15 - 41 U/L 8     ALT 0 - 44 U/L 6        RADIOGRAPHIC STUDIES: I have personally reviewed the radiological images as listed and agreed with the findings in the report. CT CHEST ABDOMEN PELVIS W CONTRAST  Result Date: 12/21/2022 CLINICAL DATA:  Colon cancer, staging.  * Tracking Code: BO * EXAM: CT CHEST, ABDOMEN, AND PELVIS WITH CONTRAST TECHNIQUE: Multidetector CT  imaging of the chest, abdomen and pelvis was performed following the standard protocol during bolus administration of intravenous contrast. RADIATION DOSE REDUCTION: This exam was performed according to the departmental dose-optimization program which includes automated exposure control, adjustment of the mA and/or kV according to patient size and/or use of iterative reconstruction technique. CONTRAST:  161m OMNIPAQUE IOHEXOL 300 MG/ML  SOLN COMPARISON:  CT August 20, 2015 FINDINGS: CT CHEST FINDINGS Cardiovascular: Normal caliber abdominal aorta. No central pulmonary embolus on this nondedicated study. Normal size heart. No significant pericardial effusion/thickening. Mediastinum/Nodes: Partially calcified 12 mm nodule in the right lobe of the thyroid previously evaluated by ultrasound January 27, 2017. No pathologically enlarged mediastinal, hilar or axillary lymph nodes. The esophagus is grossly unremarkable Lungs/Pleura: Tiny nodules along the right major fissure measures 2 mm on image 44/4 and 1-2 mm on image 45/4. Tiny nodules along the left major fissure measure up to 1-2 mm on image 34/4. No pleural effusion.  No pneumothorax. Musculoskeletal: No aggressive lytic or blastic lesion of bone CT ABDOMEN PELVIS FINDINGS Hepatobiliary: No suspicious hepatic lesion. Gallbladder is unremarkable. No biliary ductal dilation. Pancreas: No pancreatic ductal dilation or evidence of acute inflammation. Spleen: No splenomegaly. Adrenals/Urinary Tract: Indeterminate 12 mm nodule in the right adrenal gland on image 45/2 and 1 cm nodule in the left adrenal gland on image 47/2 which are new from CT August 20 2015. No hydronephrosis. Nonobstructive bilateral renal stones measure up to 4 mm in the right kidney. Urinary bladder is unremarkable for degree of distension Stomach/Bowel: Radiopaque enteric contrast material traverses the ascending colon. Stomach is unremarkable for degree of distension. No pathologic dilation of small  or large bowel. Metallic clips in the ascending colon on image 82/2 and at the splenic flexure/descending colon on image 58/2 likely reflect hemostatic clips paste colonoscopy. Colon is minimally distended and no discrete colonic lesion identified. Vascular/Lymphatic: Normal caliber abdominal aorta. Smooth IVC contours. Reproductive: 7.7 cm left ovarian cyst. Right ovary appears normal. Probable prior hysterectomy. Other: No significant abdominopelvic free fluid. No discrete peritoneal or omental nodularity. Musculoskeletal: No aggressive lytic or blastic lesion of bone. IMPRESSION: 1. Colon is minimally distended and no discrete colonic lesion is identified to correspond with patient's given history of colonic neoplasm. 2. Bilateral adrenal nodules measuring 12 mm in the right adrenal gland and 1 cm in the left adrenal gland are new from CT August 20 2015. These likely reflect benign adrenal adenomas however they are technically indeterminate and small metastatic lesions are not excluded, consider more definitive characterization with pre and postcontrast adrenal protocol CT or MRI versus attention on follow-up oncologic imaging. 3. Tiny bilateral pulmonary nodules along the major fissures measuring up to 2 mm are nonspecific but favored to reflect intrapulmonary lymph nodes. However, given lack of prior  imaging for comparison these warrant attention on short-term interval follow-up dedicated chest CT. 4. 7.7 cm left ovarian cyst, recommend further evaluation by dedicated pelvic ultrasound. 5. Nonobstructive bilateral renal stones measure up to 4 mm in the right kidney. Electronically Signed   By: Dahlia Bailiff M.D.   On: 12/21/2022 16:26     Orders Placed This Encounter  Procedures   CEA (Access)-[facility] ONLY    Standing Status:   Standing    Number of Occurrences:   10    Standing Expiration Date:   12/28/2023   Ferritin    Standing Status:   Standing    Number of Occurrences:   20    Standing Expiration  Date:   12/28/2023   Iron and Iron Binding Capacity ([facility] only)    Standing Status:   Future    Number of Occurrences:   1    Standing Expiration Date:   12/28/2023   Comprehensive metabolic panel    Standing Status:   Standing    Number of Occurrences:   50    Standing Expiration Date:   12/28/2023    All questions were answered. The patient knows to call the clinic with any problems, questions or concerns. The total time spent in the appointment was 60 minutes.     Truitt Merle, MD 12/27/2022  Felicity Coyer am acting as scribe for Truitt Merle, MD.   I have reviewed the above documentation for accuracy and completeness, and I agree with the above.

## 2022-12-27 NOTE — Progress Notes (Signed)
I met with [name] and her sister before her consultation with Dr Burr Medico.  I explained my role as a nurse navigator and provided my contact information.  All questions were answered.  They verbalized understanding.

## 2022-12-28 ENCOUNTER — Ambulatory Visit: Payer: Self-pay | Admitting: Surgery

## 2022-12-28 ENCOUNTER — Encounter: Payer: Self-pay | Admitting: Gastroenterology

## 2022-12-28 ENCOUNTER — Other Ambulatory Visit (HOSPITAL_COMMUNITY): Payer: Medicare Other

## 2022-12-28 ENCOUNTER — Inpatient Hospital Stay: Payer: Medicare Other | Admitting: Licensed Clinical Social Worker

## 2022-12-28 DIAGNOSIS — R739 Hyperglycemia, unspecified: Secondary | ICD-10-CM

## 2022-12-28 DIAGNOSIS — Z860100 Personal history of colon polyps, unspecified: Secondary | ICD-10-CM | POA: Insufficient documentation

## 2022-12-28 DIAGNOSIS — H9193 Unspecified hearing loss, bilateral: Secondary | ICD-10-CM | POA: Insufficient documentation

## 2022-12-28 DIAGNOSIS — Z531 Procedure and treatment not carried out because of patient's decision for reasons of belief and group pressure: Secondary | ICD-10-CM

## 2022-12-28 DIAGNOSIS — C184 Malignant neoplasm of transverse colon: Secondary | ICD-10-CM

## 2022-12-28 DIAGNOSIS — R1900 Intra-abdominal and pelvic swelling, mass and lump, unspecified site: Secondary | ICD-10-CM | POA: Insufficient documentation

## 2022-12-28 HISTORY — DX: Personal history of colon polyps, unspecified: Z86.0100

## 2022-12-28 HISTORY — DX: Procedure and treatment not carried out because of patient's decision for reasons of belief and group pressure: Z53.1

## 2022-12-28 LAB — FERRITIN: Ferritin: 352 ng/mL — ABNORMAL HIGH (ref 11–307)

## 2022-12-28 LAB — CEA (ACCESS): CEA (CHCC): <1 ng/mL (ref 0.00–5.00)

## 2022-12-28 NOTE — Progress Notes (Signed)
[facility]  Initial Assessment   [name] is a 52 y.o. year old female contacted by phone. Clinical Social Work was referred by medical provider for assessment of psychosocial needs.   SDOH (Social Determinants of Health) assessments performed: Yes SDOH Interventions    Flowsheet Row Office Visit from 12/03/2022 in [facility] Office Visit from 07/12/2022 in [facility] Management from 03/24/2021 in [facility] Office Visit from 05/30/2020 in [facility] Office Visit from 05/01/2020 in [facility] Office Visit from 08/16/2019 in [facility]  SDOH Interventions        Transportation Interventions -- -- Other (Comment) -- -- --  Depression Interventions/Treatment  Medication, Counseling Medication, Counseling -- Medication, Counseling, Currently on Treatment Currently on Treatment, Counseling Currently on Treatment       SDOH Screenings   Food Insecurity: No Food Insecurity (12/03/2022)  Housing: Low Risk  (12/03/2022)  Transportation Needs: Unmet Transportation Needs (12/03/2022)  Utilities: Not At Risk (12/03/2022)  Alcohol Screen: Low Risk  (04/16/2022)  Depression (PHQ2-9): Medium Risk (12/03/2022)  Financial Resource Strain: Low Risk  (04/16/2022)  Physical Activity: Inactive (04/16/2022)  Social Connections: Moderately Isolated (12/03/2022)  Stress: No Stress Concern Present (04/16/2022)  Tobacco Use: Low Risk  (12/27/2022)     Distress Screen completed: No     No data to display            Family/Social Information:  Housing Arrangement: patient lives with her son who assists pt.  CSW spoke w/ pt's son to obtain details for assessment.  Pt receives social security disability due to cognitive impairment as a result of cerebral palsy.  Pt also w/ a Pmhx significant for schizoaffective disorder.  Per pt's son,  pt has been connected to [facility] for some time who come to pt's home weekly to monitor. Family members/support persons in your life? Pt's sister resides nearby and is actively involved in pt's care.  Pt is also very connected to her [place] community who provide support as well. Transportation concerns: Per son, he and pt's sister will provide transport to appointments.  Employment: Legally disabled .  Income source: Social Security Disability Financial concerns: Yes Type of concern: Pt lives on a fixed income which makes it sometimes pay for unexpected expenses. Food access concerns: no Religious or spiritual practice: Yes-[employer] Services Currently in place:  [facility]  Coping/ Adjustment to diagnosis: Patient understands treatment plan and what happens next? Pt requires additional staging workup to determine treatment plan Concerns about diagnosis and/or treatment: Overwhelmed by information and Quality of life Patient reported stressors: Anxiety/ nervousness and Adjusting to my illness Hopes and/or priorities: Family's priority is to finish diagnostics and start treatment w/ the hope of positive results. Patient enjoys time with family/ friends Current coping skills/ strengths: Religious Affiliation  and Supportive family/friends     SUMMARY: Current SDOH Barriers:  Financial constraints related to fixed income  Clinical Social Work Clinical Goal(s):  No clinical social work goals at this time  Interventions: Discussed common feeling and emotions when being diagnosed with cancer, and the importance of support during treatment Informed patient of the support team roles and support services at [facility] Provided [facility] contact information and encouraged patient to call with any questions or concerns Provided pt's son with information about the Lita M[facility] to apply once treatment begins.   Follow Up Plan: Patient will contact  CSW with any support or resource  needs Patient verbalizes understanding of plan: Yes    Henriette Combs, LCSW   Patient is participating in a Managed Medicaid Plan:  Yes

## 2022-12-29 ENCOUNTER — Ambulatory Visit (HOSPITAL_COMMUNITY)
Admission: RE | Admit: 2022-12-29 | Discharge: 2022-12-29 | Disposition: A | Discharge status: Home / Self Care | Payer: Medicare Other | Source: Ambulatory Visit | Attending: Gastroenterology | Admitting: Gastroenterology

## 2022-12-29 DIAGNOSIS — N83202 Unspecified ovarian cyst, left side: Secondary | ICD-10-CM | POA: Insufficient documentation

## 2022-12-30 ENCOUNTER — Encounter (HOSPITAL_COMMUNITY): Payer: Self-pay

## 2022-12-30 NOTE — Progress Notes (Addendum)
COVID Vaccine Completed:  Yes  Date of COVID positive in last 90 days:  PCP - Nani Gasser, MD Cardiologist -   Chest x-ray - 06-23-22 Epic EKG - 06-24-22 Epic Stress Test -  ECHO - 06-24-22 Epic Cardiac Cath -  Pacemaker/ICD device last checked: Spinal Cord Stimulator:  Bowel Prep - Yes  Sleep Study - Yes, +sleep apnea CPAP -   Fasting Blood Sugar -  Checks Blood Sugar _____ times a day  Victoza qd Last dose of GLP1 agonist-  N/A GLP1 instructions:  Hold 24 hours prior to surgery   Last dose of SGLT-2 inhibitors-  N/A SGLT-2 instructions: N/A   Blood Thinner Instructions: Aspirin Instructions: Last Dose:  Activity level:  Can go up a flight of stairs and perform activities of daily living without stopping and without symptoms of chest pain or shortness of breath.  Able to exercise without symptoms  Unable to go up a flight of stairs without symptoms of     Anesthesia review: Congenital cerebral palsy, hx of heart murmur at birth, HTN, DM, OSA  Patient denies shortness of breath, fever, cough and chest pain at PAT appointment  Patient verbalized understanding of instructions that were given to them at the PAT appointment. Patient was also instructed that they will need to review over the PAT instructions again at home before surgery.

## 2022-12-31 NOTE — Patient Instructions (Signed)
SURGICAL WAITING ROOM VISITATION Patients having surgery or a procedure may have no more than 2 support people in the waiting area - these visitors may rotate.    If the patient needs to stay at the hospital during part of their recovery, the visitor guidelines for inpatient rooms apply. Pre-op nurse will coordinate an appropriate time for 1 support person to accompany patient in pre-op.  This support person may not rotate.    Please refer to the [facility] website for the visitor guidelines for Inpatients (after your surgery is over and you are in a regular room).   Due to an increase in RSV and influenza rates and associated hospitalizations, children ages 82 and under may not visit patients in [place].     Your procedure is scheduled on: 01-20-23   Report to [facility] Main Entrance    Report to admitting at 5:15 AM   Call this number if you have problems the morning of surgery [phone]   Follow a clear liquid diet the day of prep to prevent dehydration   Do not eat food :After Midnight.   After Midnight you may have the following liquids until 4:30 AM DAY OF SURGERY  Water Non-Citrus Juices (without pulp, NO RED) Carbonated Beverages Black Coffee (NO MILK/CREAM OR CREAMERS, sugar ok)  Clear Tea (NO MILK/CREAM OR CREAMERS, sugar ok) regular and decaf                             Plain Jell-O (NO RED)                                           Fruit ices (not with fruit pulp, NO RED)                                     Popsicles (NO RED)                                                               Sports drinks like Gatorade (NO RED)  Drink 2 Pre-Surgery G2 the evening before surgery (complete by 10 PM)                   The day of surgery:  Drink ONE (1) Pre-Surgery G2 at 4:30 AM the morning of surgery. Drink in one sitting. Do not sip.  This drink was given to you during your hospital  pre-op appointment visit. Nothing else to drink after  completing the Pre-Surgery G2.          If you have questions, please contact your surgeon's office.   FOLLOW BOWEL PREP AND ANY ADDITIONAL PRE OP INSTRUCTIONS YOU RECEIVED FROM YOUR SURGEON'S OFFICE!!! -Clear liquids day of prep to prevent dehydration  - Bisacodyl (Dulcolax) 20 mg - Give with water the day prior to surgery.   - Miralax 255g - Mix with 64 oz Gatorade/Powerade.  Drink gradually over the next few hours (8 oz glass every 15-30 minutes) until gone the day prior to surgery.   -Metronidazole (Flagyl)  1000 mg - At 2 pm, 3 pm and 10 pm after Miralax bowel prep the day prior to surgery.    -Neomycin 1000 mg - At 2 pm, 3 pm and 10 pm after Miralax  bowel prep the day prior to surgery.      Oral Hygiene is also important to reduce your risk of infection.                                    Remember - BRUSH YOUR TEETH THE MORNING OF SURGERY WITH YOUR REGULAR TOOTHPASTE   Do NOT smoke after Midnight   Take these medicines the morning of surgery with A SIP OF WATER:   Amlodipine  Atorvastatin  Carvedilol  Duloxetine  Omeprazole  Tylenol if needed  Okay to use nasal spray  How to Manage Your Diabetes Before and After Surgery  Why is it important to control my blood sugar before and after surgery? Improving blood sugar levels before and after surgery helps healing and can limit problems. A way of improving blood sugar control is eating a healthy diet by:  Eating less sugar and carbohydrates  Increasing activity/exercise  Talking with your doctor about reaching your blood sugar goals High blood sugars (greater than 180 mg/dL) can raise your risk of infections and slow your recovery, so you will need to focus on controlling your diabetes during the weeks before surgery. Make sure that the doctor who takes care of your diabetes knows about your planned surgery including the date and location.  How do I manage my blood sugar before surgery? Check your blood sugar at least 4  times a day, starting 2 days before surgery, to make sure that the level is not too high or low. Check your blood sugar the morning of your surgery when you wake up and every 2 hours until you get to the Short Stay unit. If your blood sugar is less than 70 mg/dL, you will need to treat for low blood sugar: Do not take insulin. Treat a low blood sugar (less than 70 mg/dL) with  cup of clear juice (cranberry or apple), 4 glucose tablets, OR glucose gel. Recheck blood sugar in 15 minutes after treatment (to make sure it is greater than 70 mg/dL). If your blood sugar is not greater than 70 mg/dL on recheck, call [phone] for further instructions. Report your blood sugar to the short stay nurse when you get to Short Stay.  If you are admitted to the hospital after surgery: Your blood sugar will be checked by the staff and you will probably be given insulin after surgery (instead of oral diabetes medicines) to make sure you have good blood sugar levels. The goal for blood sugar control after surgery is 80-180 mg/dL.   WHAT DO I DO ABOUT MY DIABETES MEDICATION?  Do not take oral diabetes medicines (pills) the morning of surgery.  Hold Victoza 24 hours prior to surgery .  DO NOT TAKE THE FOLLOWING 7 DAYS PRIOR TO SURGERY: Ozempic, Wegovy, Rybelsus (Semaglutide), Byetta (exenatide), Bydureon (exenatide ER), Victoza, Saxenda (liraglutide), or Trulicity (dulaglutide) Mounjaro (Tirzepatide) Adlyxin (Lixisenatide), Polyethylene Glycol Loxenatide.   Reviewed and Endorsed by [facility] Patient Education Committee, August 2015  Bring CPAP mask and tubing day of surgery.  You may not have any metal on your body including hair pins, jewelry, and body piercing             Do not wear make-up, lotions, powders, perfumes or deodorant  Do not wear nail polish including gel and S&S, artificial/acrylic nails, or any other type of covering on natural nails including finger and  toenails. If you have artificial nails, gel coating, etc. that needs to be removed by a nail salon please have this removed prior to surgery or surgery may need to be canceled/ delayed if the surgeon/ anesthesia feels like they are unable to be safely monitored.   Do not shave  48 hours prior to surgery.             Do not bring valuables to the hospital. [place].   Contacts, dentures or bridgework may not be worn into surgery.   Bring small overnight bag day of surgery.   DO NOT [place]. PHARMACY WILL DISPENSE MEDICATIONS LISTED ON YOUR MEDICATION LIST TO YOU DURING YOUR ADMISSION [place]!    Special Instructions: Bring a copy of your healthcare power of attorney and living will documents the day of surgery if you haven't scanned them before.              Please read over the following fact sheets you were given: IF [place]  If you received a COVID test during your pre-op visit  it is requested that you wear a mask when out in public, stay away from anyone that may not be feeling well and notify your surgeon if you develop symptoms. If you test positive for Covid or have been in contact with anyone that has tested positive in the last 10 days please notify you surgeon.  [place] - Preparing for Surgery Before surgery, you can play an important role.  Because skin is not sterile, your skin needs to be as free of germs as possible.  You can reduce the number of germs on your skin by washing with CHG (chlorahexidine gluconate) soap before surgery.  CHG is an antiseptic cleaner which kills germs and bonds with the skin to continue killing germs even after washing. Please DO NOT use if you have an allergy to CHG or antibacterial soaps.  If your skin becomes reddened/irritated stop using the CHG and inform your nurse when you arrive at Short  Stay. Do not shave (including legs and underarms) for at least 48 hours prior to the first CHG shower.  You may shave your face/neck.  Please follow these instructions carefully:  1.  Shower with CHG Soap the night before surgery and the  morning of surgery.  2.  If you choose to wash your hair, wash your hair first as usual with your normal  shampoo.  3.  After you shampoo, rinse your hair and body thoroughly to remove the shampoo.                             4.  Use CHG as you would any other liquid soap.  You can apply chg directly to the skin and wash.  Gently with a scrungie or clean washcloth.  5.  Apply the CHG Soap to your body ONLY FROM THE NECK DOWN.   Do   not use on  face/ open                           Wound or open sores. Avoid contact with eyes, ears mouth and   genitals (private parts).                       Wash face,  Genitals (private parts) with your normal soap.             6.  Wash thoroughly, paying special attention to the area where your    surgery  will be performed.  7.  Thoroughly rinse your body with warm water from the neck down.  8.  DO NOT shower/wash with your normal soap after using and rinsing off the CHG Soap.                9.  Pat yourself dry with a clean towel.            10.  Wear clean pajamas.            11.  Place clean sheets on your bed the night of your first shower and do not  sleep with pets. Day of Surgery : Do not apply any lotions/deodorants the morning of surgery.  Please wear clean clothes to the hospital/surgery center.  FAILURE TO FOLLOW THESE INSTRUCTIONS MAY RESULT IN THE CANCELLATION OF YOUR SURGERY  PATIENT SIGNATURE_________________________________  NURSE SIGNATURE__________________________________  ________________________________________________________________________   WHAT IS A BLOOD TRANSFUSION? Blood Transfusion Information  A transfusion is the replacement of blood or some of its parts. Blood is made up of multiple  cells which provide different functions. Red blood cells carry oxygen and are used for blood loss replacement. White blood cells fight against infection. Platelets control bleeding. Plasma helps clot blood. Other blood products are available for specialized needs, such as hemophilia or other clotting disorders. BEFORE THE TRANSFUSION  Who gives blood for transfusions?  Healthy volunteers who are fully evaluated to make sure their blood is safe. This is blood bank blood. Transfusion therapy is the safest it has ever been in the practice of medicine. Before blood is taken from a donor, a complete history is taken to make sure that person has no history of diseases nor engages in risky social behavior (examples are intravenous drug use or sexual activity with multiple partners). The donor's travel history is screened to minimize risk of transmitting infections, such as malaria. The donated blood is tested for signs of infectious diseases, such as HIV and hepatitis. The blood is then tested to be sure it is compatible with you in order to minimize the chance of a transfusion reaction. If you or a relative donates blood, this is often done in anticipation of surgery and is not appropriate for emergency situations. It takes many days to process the donated blood. RISKS AND COMPLICATIONS Although transfusion therapy is very safe and saves many lives, the main dangers of transfusion include:  Getting an infectious disease. Developing a transfusion reaction. This is an allergic reaction to something in the blood you were given. Every precaution is taken to prevent this. The decision to have a blood transfusion has been considered carefully by your caregiver before blood is given. Blood is not given unless the benefits outweigh the risks. AFTER THE TRANSFUSION Right after receiving a blood transfusion, you will usually feel much better and more energetic. This is especially true if your red blood cells have  gotten low (anemic). The transfusion raises the level of the red blood cells which carry oxygen, and this usually causes an energy increase. The nurse administering the transfusion will monitor you carefully for complications. HOME CARE INSTRUCTIONS  No special instructions are needed after a transfusion. You may find your energy is better. Speak with your caregiver about any limitations on activity for underlying diseases you may have. SEEK MEDICAL CARE IF:  Your condition is not improving after your transfusion. You develop redness or irritation at the intravenous (IV) site. SEEK IMMEDIATE MEDICAL CARE IF:  Any of the following symptoms occur over the next 12 hours: Shaking chills. You have a temperature by mouth above 102 F (38.9 C), not controlled by medicine. Chest, back, or muscle pain. People around you feel you are not acting correctly or are confused. Shortness of breath or difficulty breathing. Dizziness and fainting. You get a rash or develop hives. You have a decrease in urine output. Your urine turns a dark color or changes to pink, red, or [place]. Any of the following symptoms occur over the next 10 days: You have a temperature by mouth above 102 F (38.9 C), not controlled by medicine. Shortness of breath. Weakness after normal activity. The white part of the eye turns yellow (jaundice). You have a decrease in the amount of urine or are urinating less often. Your urine turns a dark color or changes to pink, red, or [place]. Document Released: 10/29/2000 Document Revised: 01/24/2012 Document Reviewed: 06/17/2008 [facility] Patient Information 2014 [place], [place].  _______________________________________________________________________

## 2023-01-03 ENCOUNTER — Inpatient Hospital Stay (HOSPITAL_COMMUNITY)
Admission: RE | Admit: 2023-01-03 | Discharge: 2023-01-03 | Disposition: A | Discharge status: Home / Self Care | Payer: Medicare Other | Source: Ambulatory Visit

## 2023-01-03 DIAGNOSIS — D649 Anemia, unspecified: Secondary | ICD-10-CM

## 2023-01-03 DIAGNOSIS — Z01818 Encounter for other preprocedural examination: Secondary | ICD-10-CM

## 2023-01-03 DIAGNOSIS — E119 Type 2 diabetes mellitus without complications: Secondary | ICD-10-CM

## 2023-01-03 HISTORY — DX: Malignant neoplasm of hepatic flexure: C18.3

## 2023-01-10 ENCOUNTER — Telehealth: Payer: Self-pay

## 2023-01-10 ENCOUNTER — Other Ambulatory Visit: Payer: Self-pay | Admitting: Hematology

## 2023-01-10 DIAGNOSIS — C184 Malignant neoplasm of transverse colon: Secondary | ICD-10-CM

## 2023-01-10 DIAGNOSIS — Z91041 Radiographic dye allergy status: Secondary | ICD-10-CM

## 2023-01-10 MED ORDER — DIPHENHYDRAMINE HCL 50 MG PO TABS: ORAL_TABLET | ORAL | 0 refills | Status: DC

## 2023-01-10 MED ORDER — PREDNISONE 50 MG PO TABS: ORAL_TABLET | ORAL | 0 refills | Status: DC

## 2023-01-10 NOTE — Progress Notes (Signed)
I spoke with Ms Desch's sister, Norina Buzzard who assist Ms Cady with her medical care and appts.  I told Ms Reuel Boom Dr Burr Medico has ordered a Ct scan adrenal protocol to evaluate the adrenal nodules seen on her 2/6 CT scan.  This appt has been made for 3/3 arrive at 1400 at our [facility] location.  Instructions: npo after 1000.  I did inform Ms Reuel Boom that Ms Navarre will need to drink oral contrast when she arrives to for her Ct Scan.  Ms Redding also has an allergy to the IV contrast.  Prescriptions for Prednisone 50 mg and Benadryl 50 mg was sent to her pharmacy.. I reviewed the instructions with Ms Reuel Boom: prednisone 1 table 13 hours, 7 hours, and 1 hour prior to the CT scan.  Benadryl 50 mg 1 hour prior to the Ct Scan. I confirmed that these prescriptions should be sent to [facility] on [address], [place]. I gave Ms Reuel Boom my direct phone number should she have any questions or concerns.  All questions were answered.  She verbalized understanding.

## 2023-01-10 NOTE — Telephone Encounter (Signed)
Ms.Uddin and her sister Reuel Boom are aware Dr. Berline Lopes will be available at the time of her surgery with Dr. Johney [place]. If the ovaries appear abnormal at the time of surgery, she will plan on removing both ovaries and fallopian tubes. Dr. Berline Lopes can see her in the office for consultation to discuss further or can discuss with her the morning of surgery as well.   Ms. Espericueta and Reuel Boom have decided to discuss the morning of surgery. Office number given for any questions or concerns before surgery date.

## 2023-01-14 ENCOUNTER — Encounter (HOSPITAL_COMMUNITY): Admission: RE | Admit: 2023-01-14 | Payer: Medicare Other | Source: Ambulatory Visit

## 2023-01-16 ENCOUNTER — Ambulatory Visit (HOSPITAL_BASED_OUTPATIENT_CLINIC_OR_DEPARTMENT_OTHER)
Admission: RE | Admit: 2023-01-16 | Discharge: 2023-01-16 | Disposition: A | Discharge status: Home / Self Care | Payer: Medicare Other | Source: Ambulatory Visit | Attending: Hematology | Admitting: Hematology

## 2023-01-16 DIAGNOSIS — C184 Malignant neoplasm of transverse colon: Secondary | ICD-10-CM | POA: Diagnosis not present

## 2023-01-16 MED ORDER — PREDNISONE 50 MG PO TABS: 50.0000 mg | ORAL_TABLET | Freq: Four times a day (QID) | ORAL | Status: DC

## 2023-01-16 MED ORDER — DIPHENHYDRAMINE HCL 50 MG/ML IJ SOLN: 50.0000 mg | Freq: Once | INTRAMUSCULAR | Status: DC

## 2023-01-16 MED ORDER — IOHEXOL 300 MG/ML  SOLN
100.0000 mL | Freq: Once | INTRAMUSCULAR | Status: AC | PRN
  Administered 2023-01-16: 100 mL via INTRAVENOUS

## 2023-01-16 MED ORDER — DIPHENHYDRAMINE HCL 25 MG PO CAPS: 50.0000 mg | ORAL_CAPSULE | Freq: Once | ORAL | Status: DC

## 2023-01-18 NOTE — Progress Notes (Signed)
I spoke with Ms Horald Chestnut, Ms Glastetter's sister who helps with her medical appts.  I reviewed Ct Scan results and scheduled post op follow up with Dr Burr Medico on 5/1. All questions were answered.  Ms Horald Chestnut verbalized understanding.

## 2023-01-20 DIAGNOSIS — D649 Anemia, unspecified: Secondary | ICD-10-CM

## 2023-01-20 DIAGNOSIS — E119 Type 2 diabetes mellitus without complications: Secondary | ICD-10-CM

## 2023-01-20 DIAGNOSIS — Z01818 Encounter for other preprocedural examination: Secondary | ICD-10-CM

## 2023-02-01 NOTE — Progress Notes (Addendum)
Anesthesia Review:  [facility]: [facility]  Cardiologist : none  Chest x-ray : 06/23/22- 2 view  EKG : 06/24/22  Echo : 06/24/22  Stress test: Cardiac Cath :  Activity level: can do a flight of stairs without difficutgly  Sleep Study/ CPAP : sleep apnea no cpap  Fasting Blood Sugar :      / Checks Blood Sugar -- times a day:   Blood Thinner/ Instructions /Last Dose: ASA / Instructions/ Last Dose :    DM- type 2- does not check glucose at home  Hgba1c- 02/04/23 - 6.0   Case manager- Mateo Flow , present at preop appt.  PT was 30 minutes late for preop .  PT is HOH.  Hearing aid is broken per pt.  PT has Cerebral Palsy.  No mobility deficits noted.  PT is Jehovah Witness.  Copy of Health Care POA on chart.  PT signed blood refusal consent .   Blood Refusal:  Consent signed and on front of chart.   Placed in allergies.   Consent form faxed to Dr Johney [place] along with Advanced directive.  Consent form faxed to DR Berline Lopes along with Advanced Directive.    Consent form faxed to blood bank.   ORder placed in epic.   Also in [place].    CBC with hgb of 9.7 and BMP with Potassium of 3.0 done 02/04/23  routed to DR Gross, DR Jeral Pinch and Joylene John, NP.  Shawn Stall aware.    At time of preop appt Mateo Flow, Case Manager aware of Blood Product Refusal.  Mateo Flow stated at time of preop appt she knew this surgery was serious and she was going to talk to pt and family in regards to this.    PT has Case Manager- Valierie who was present at preop appt.  Mateo Flow works for [employer]  Mateo Flow to make sure copy of bowel prep instructions at home and to  have RN review with pt.  IF no bowel prep instructions Mateo Flow to call [facility] to obtain.

## 2023-02-02 NOTE — Patient Instructions (Addendum)
SURGICAL WAITING ROOM VISITATION  Patients having surgery or a procedure may have no more than 2 support people in the waiting area - these visitors may rotate.    Children under the age of 89 must have an adult with them who is not the patient.  Due to an increase in RSV and influenza rates and associated hospitalizations, children ages 20 and under may not visit patients in [place].  If the patient needs to stay at the hospital during part of their recovery, the visitor guidelines for inpatient rooms apply. Pre-op nurse will coordinate an appropriate time for 1 support person to accompany patient in pre-op.  This support person may not rotate.    Please refer to the [facility] website for the visitor guidelines for Inpatients (after your surgery is over and you are in a regular room).       Your procedure is scheduled on:  02/16/2023      Report to [facility] Main Entrance    Report to admitting at   1200 noon    Call this number if you have problems the morning of surgery [phone]  [place] liquid diet day of bowel prep.     After Midnight you may have the following liquids until __ 1100____ Am  DAY OF SURGERY  Water Non-Citrus Juices (without pulp, NO RED-Apple, White grape, White cranberry) Black Coffee (NO MILK/CREAM OR CREAMERS, sugar ok)  Clear Tea (NO MILK/CREAM OR CREAMERS, sugar ok) regular and decaf                             Plain Jell-O (NO RED)                                           Fruit ices (not with fruit pulp, NO RED)                                     Popsicles (NO RED)                                                               Sports drinks like Gatorade (NO RED)              Drink 2 Ensure/G2 drinks AT 10:00 PM the night before surgery.        The day of surgery:  Drink ONE (1) Pre-Surgery Clear Ensure or G2 at  1100 AM ( have completed by )  the morning of surgery. Drink in one sitting. Do not sip.  This drink was given  to you during your hospital  pre-op appointment visit. Nothing else to drink after completing the  Pre-Surgery Clear Ensure or G2.          If you have questions, please contact your surgeon's office.   FOLLOW BOWEL PREP AND ANY ADDITIONAL PRE OP INSTRUCTIONS YOU RECEIVED FROM YOUR SURGEON'S OFFICE!!!     Oral Hygiene is also important to reduce your risk of infection.  Remember - BRUSH YOUR TEETH THE MORNING OF SURGERY WITH YOUR REGULAR TOOTHPASTE  DENTURES WILL BE REMOVED PRIOR TO SURGERY PLEASE DO NOT APPLY "Poly grip" OR ADHESIVES!!!   Do NOT smoke after Midnight   Take these medicines the morning of surgery with A SIP OF WATER:  Amlodipine coreg, cymbalta, flonase if needed, omeprazole   DO NOT TAKE ANY ORAL DIABETIC MEDICATIONS DAY OF YOUR SURGERY  Bring CPAP mask and tubing day of surgery.                              You may not have any metal on your body including hair pins, jewelry, and body piercing             Do not wear make-up, lotions, powders, perfumes/cologne, or deodorant  Do not wear nail polish including gel and S&S, artificial/acrylic nails, or any other type of covering on natural nails including finger and toenails. If you have artificial nails, gel coating, etc. that needs to be removed by a nail salon please have this removed prior to surgery or surgery may need to be canceled/ delayed if the surgeon/ anesthesia feels like they are unable to be safely monitored.   Do not shave  48 hours prior to surgery.               Men may shave face and neck.   Do not bring valuables to the hospital. [place].   Contacts, glasses, dentures or bridgework may not be worn into surgery.   Bring small overnight bag day of surgery.   DO NOT [place]. PHARMACY WILL DISPENSE MEDICATIONS LISTED ON YOUR MEDICATION LIST TO YOU DURING YOUR ADMISSION [place]!    Patients discharged on the day of surgery will not be allowed to drive home.  Someone NEEDS to stay with you for the first 24 hours after anesthesia.   Special Instructions: Bring a copy of your healthcare power of attorney and living will documents the day of surgery if you haven't scanned them before.              Please read over the following fact sheets you were given: IF [place] [phone]   If you received a COVID test during your pre-op visit  it is requested that you wear a mask when out in public, stay away from anyone that may not be feeling well and notify your surgeon if you develop symptoms. If you test positive for Covid or have been in contact with anyone that has tested positive in the last 10 days please notify you surgeon.    [place] - Preparing for Surgery Before surgery, you can play an important role.  Because skin is not sterile, your skin needs to be as free of germs as possible.  You can reduce the number of germs on your skin by washing with CHG (chlorahexidine gluconate) soap before surgery.  CHG is an antiseptic cleaner which kills germs and bonds with the skin to continue killing germs even after washing. Please DO NOT use if you have an allergy to CHG or antibacterial soaps.  If your skin becomes reddened/irritated stop using the CHG and inform your nurse when you arrive at Short Stay. Do not  shave (including legs and underarms) for at least 48 hours prior to the first CHG shower.  You may shave your face/neck. Please follow these instructions carefully:  1.  Shower with CHG Soap the night before surgery and the  morning of Surgery.  2.  If you choose to wash your hair, wash your hair first as usual with your  normal  shampoo.  3.  After you shampoo, rinse your hair and body thoroughly to remove the  shampoo.                           4.  Use CHG as you would any other liquid soap.  You can apply  chg directly  to the skin and wash                       Gently with a scrungie or clean washcloth.  5.  Apply the CHG Soap to your body ONLY FROM THE NECK DOWN.   Do not use on face/ open                           Wound or open sores. Avoid contact with eyes, ears mouth and genitals (private parts).                       Wash face,  Genitals (private parts) with your normal soap.             6.  Wash thoroughly, paying special attention to the area where your surgery  will be performed.  7.  Thoroughly rinse your body with warm water from the neck down.  8.  DO NOT shower/wash with your normal soap after using and rinsing off  the CHG Soap.                9.  Pat yourself dry with a clean towel.            10.  Wear clean pajamas.            11.  Place clean sheets on your bed the night of your first shower and do not  sleep with pets. Day of Surgery : Do not apply any lotions/deodorants the morning of surgery.  Please wear clean clothes to the hospital/surgery center.  FAILURE TO FOLLOW THESE INSTRUCTIONS MAY RESULT IN THE CANCELLATION OF YOUR SURGERY PATIENT SIGNATURE_________________________________  NURSE SIGNATURE__________________________________  ________________________________________________________________________

## 2023-02-04 ENCOUNTER — Encounter (HOSPITAL_COMMUNITY): Payer: Self-pay

## 2023-02-04 ENCOUNTER — Encounter (HOSPITAL_COMMUNITY)
Admission: RE | Admit: 2023-02-04 | Discharge: 2023-02-04 | Disposition: A | Discharge status: Home / Self Care | Payer: Medicare Other | Source: Ambulatory Visit | Attending: Surgery | Admitting: Surgery

## 2023-02-04 ENCOUNTER — Other Ambulatory Visit: Payer: Self-pay

## 2023-02-04 ENCOUNTER — Other Ambulatory Visit: Payer: Self-pay | Admitting: Student

## 2023-02-04 VITALS — BP 128/86 | HR 94 | Temp 98.5°F | Resp 16 | Ht 64.0 in | Wt 209.0 lb

## 2023-02-04 DIAGNOSIS — Z01812 Encounter for preprocedural laboratory examination: Secondary | ICD-10-CM | POA: Diagnosis present

## 2023-02-04 DIAGNOSIS — E1165 Type 2 diabetes mellitus with hyperglycemia: Secondary | ICD-10-CM | POA: Diagnosis not present

## 2023-02-04 DIAGNOSIS — I10 Essential (primary) hypertension: Secondary | ICD-10-CM

## 2023-02-04 DIAGNOSIS — E119 Type 2 diabetes mellitus without complications: Secondary | ICD-10-CM

## 2023-02-04 DIAGNOSIS — R739 Hyperglycemia, unspecified: Secondary | ICD-10-CM

## 2023-02-04 DIAGNOSIS — E139 Other specified diabetes mellitus without complications: Secondary | ICD-10-CM

## 2023-02-04 DIAGNOSIS — D649 Anemia, unspecified: Secondary | ICD-10-CM

## 2023-02-04 DIAGNOSIS — Z01818 Encounter for other preprocedural examination: Secondary | ICD-10-CM

## 2023-02-04 HISTORY — DX: Sleep apnea, unspecified: G47.30

## 2023-02-04 HISTORY — DX: Dyspnea, unspecified: R06.00

## 2023-02-04 HISTORY — DX: Anxiety disorder, unspecified: F41.9

## 2023-02-04 LAB — CBC
HCT: 31.3 % — ABNORMAL LOW (ref 36.0–46.0)
Hemoglobin: 9.7 g/dL — ABNORMAL LOW (ref 12.0–15.0)
MCH: 26.1 pg (ref 26.0–34.0)
MCHC: 31 g/dL (ref 30.0–36.0)
MCV: 84.1 fL (ref 80.0–100.0)
Platelets: 361 10*3/uL (ref 150–400)
RBC: 3.72 MIL/uL — ABNORMAL LOW (ref 3.87–5.11)
RDW: 23.3 % — ABNORMAL HIGH (ref 11.5–15.5)
WBC: 5.5 10*3/uL (ref 4.0–10.5)
nRBC: 0 % (ref 0.0–0.2)

## 2023-02-04 LAB — BASIC METABOLIC PANEL
Anion gap: 10 (ref 5–15)
BUN: <5 mg/dL — ABNORMAL LOW (ref 6–20)
CO2: 26 mmol/L (ref 22–32)
Calcium: 8.7 mg/dL — ABNORMAL LOW (ref 8.9–10.3)
Chloride: 101 mmol/L (ref 98–111)
Creatinine, Ser: 0.56 mg/dL (ref 0.44–1.00)
GFR, Estimated: >60 mL/min (ref >60)
Glucose, Bld: 173 mg/dL — ABNORMAL HIGH (ref 70–99)
Potassium: 3 mmol/L — ABNORMAL LOW (ref 3.5–5.1)
Sodium: 137 mmol/L (ref 135–145)

## 2023-02-04 LAB — GLUCOSE, CAPILLARY: Glucose-Capillary: 168 mg/dL — ABNORMAL HIGH (ref 70–99)

## 2023-02-04 LAB — HEMOGLOBIN A1C
Hgb A1c MFr Bld: 6 % — ABNORMAL HIGH (ref 4.8–5.6)
Mean Plasma Glucose: 126 mg/dL

## 2023-02-04 LAB — NO BLOOD PRODUCTS

## 2023-02-07 ENCOUNTER — Telehealth: Payer: Self-pay

## 2023-02-07 DIAGNOSIS — N83202 Unspecified ovarian cyst, left side: Secondary | ICD-10-CM

## 2023-02-07 NOTE — Telephone Encounter (Signed)
Called and scheduled patient for Transabdominal U/S of Pelvis at [facility] to follow up on ovarian cyst. Scheduled for Tuesday, April 9th at 9:00 am to arrive at 8:30 am - with/ a full bladder (drink 32 oz of water 30 mins before arrival). MyChart message sent to patient.

## 2023-02-07 NOTE — Telephone Encounter (Signed)
-----   Message from Roetta Sessions, [place] sent at 01/10/2023  1:47 PM EST ----- Regarding: FW: pelvic U/S due in April  ----- Message ----- From: Roetta Sessions, CMA Sent: 01/10/2023   1:28 PM EST To: Roetta Sessions, CMA Subject: pelvic U/S due in April                        On 2-26 Dr. Havery Moros sent:  CT scan ordered per Dr. Burr Medico to further evaluate adrenal nodules   Jan can you please place recall for pelvic US in 6 weeks? Thanks   (Around April 8th)

## 2023-02-08 ENCOUNTER — Encounter (HOSPITAL_COMMUNITY): Payer: Self-pay | Admitting: Physician Assistant

## 2023-02-08 ENCOUNTER — Encounter (HOSPITAL_COMMUNITY): Payer: Self-pay | Admitting: Anesthesiology

## 2023-02-09 ENCOUNTER — Inpatient Hospital Stay: Payer: Medicare Other | Attending: Hematology | Admitting: Licensed Clinical Social Worker

## 2023-02-09 ENCOUNTER — Encounter: Payer: Self-pay | Admitting: Hematology

## 2023-02-09 DIAGNOSIS — C184 Malignant neoplasm of transverse colon: Secondary | ICD-10-CM

## 2023-02-09 NOTE — Progress Notes (Signed)
[place] CSW Progress Note  Clinical Education officer, museum  received a call from pt's RN through her ACT team ([place] [phone]) who inquired about what additional care may be needed following surgery.  CSW assured [place] that following surgery if home care is determined to be appropriate the hospital case management team will send a referral and make sure it is confirmed prior to discharge.  Per [place], pt is well supported by family, but they may need additional teaching if pt is discharged with a colostomy.  CSW to remain available to provide support as appropriate throughout duration of treatment.      Henriette Combs, LCSW    Patient is participating in a Managed Medicaid Plan:  Yes

## 2023-02-09 NOTE — Progress Notes (Signed)
ACT Team rep called while present with patient to inquire about applying for [employer].  Advised this would be done once her treatment plan has been established and patient be in Chemotherapy or Radiation. This would be discussed after chemo education class. He verbalized understanding.  They have my contact name and number for any additional financial questions or concerns.

## 2023-02-15 NOTE — Anesthesia Preprocedure Evaluation (Deleted)
Anesthesia Evaluation    Reviewed: Allergy & Precautions, Patient's Chart, lab work & pertinent test results, reviewed documented beta blocker date and time   Airway        Dental   Pulmonary sleep apnea           Cardiovascular hypertension, Pt. on medications and Pt. on home beta blockers + Valvular Problems/Murmurs (mild MR) MR   Echo 2023     ECHOCARDIOGRAM REPORT       Patient Name:   [name] Date of Exam: 06/24/2022 Medical Rec #:  [id number]       Height:       64.0 in Accession #:    [id number]      Weight:       213.8 lb Date of Birth:  10-Dec-1970        BSA:          2.013 m Patient Age:    52 years        BP:           122/80 mmHg Patient Gender: F               HR:           90 bpm. Exam Location:  Inpatient  Procedure: 2D Echo, Cardiac Doppler and Color Doppler  Indications:    Syncope   History:        Patient has prior history of Echocardiogram examinations, most                 recent 12/28/2016. Signs/Symptoms:Dyspnea; Risk Factors:Diabetes,                 Dyslipidemia and Sleep Apnea.   Sonographer:    Clayton Lefort RDCS (AE) Referring Phys: [facility]    Sonographer Comments: Technically difficult study due to poor echo windows. IMPRESSIONS    1. Left ventricular ejection fraction, by estimation, is 55%. The left ventricle has normal function. The left ventricle has no regional wall motion abnormalities. Left ventricular diastolic parameters are indeterminate.  2. Right ventricular systolic function is normal. The right ventricular size is normal. There is normal pulmonary artery systolic pressure. The estimated right ventricular systolic pressure is [id number] mmHg.  3. The mitral valve is normal in structure. Mild mitral valve regurgitation. No evidence of mitral stenosis.  4. The aortic valve is grossly normal. Aortic valve regurgitation is not visualized. No aortic stenosis is present.   5. The inferior vena cava is dilated in size with <50% respiratory variability, suggesting right atrial pressure of 15 mmHg.     Neuro/Psych  PSYCHIATRIC DISORDERS Anxiety Depression  Schizophrenia  Mild CP  Neuromuscular disease    GI/Hepatic Neg liver ROS,GERD  Medicated and Controlled,,CRC   Endo/Other  diabetes, Well Controlled, Type 2, Insulin Dependent, Oral Hypoglycemic Agents  A1c 6.0  Renal/GU negative Renal ROS  negative genitourinary   Musculoskeletal negative musculoskeletal ROS (+)    Abdominal   Peds  Hematology  (+) Blood dyscrasia, anemia Hb 9.7, plt 361   Anesthesia Other Findings   Reproductive/Obstetrics negative OB ROS                              Anesthesia Physical Anesthesia Plan  ASA: 3  Anesthesia Plan: General   Post-op Pain Management: Tylenol PO (pre-op)*, Lidocaine infusion*, Ketamine IV* and Dilaudid IV   Induction: Intravenous  PONV Risk Score and Plan: 4 or greater  and Ondansetron, Dexamethasone, Midazolam and Treatment may vary due to age or medical condition  Airway Management Planned: Oral ETT  Additional Equipment: None  Intra-op Plan:   Post-operative Plan: Extubation in OR  Informed Consent:   Plan Discussed with:   Anesthesia Plan Comments:          Anesthesia Quick Evaluation

## 2023-02-16 ENCOUNTER — Ambulatory Visit (HOSPITAL_COMMUNITY)
Admission: RE | Admit: 2023-02-16 | Discharge: 2023-02-16 | Disposition: A | Discharge status: Home / Self Care | Payer: Medicare Other | Source: Ambulatory Visit | Attending: Surgery | Admitting: Surgery

## 2023-02-16 ENCOUNTER — Encounter (HOSPITAL_COMMUNITY)
Admission: RE | Disposition: A | Discharge status: Home / Self Care | Payer: Self-pay | Source: Ambulatory Visit | Attending: Surgery

## 2023-02-16 ENCOUNTER — Encounter (HOSPITAL_COMMUNITY): Payer: Self-pay | Admitting: Surgery

## 2023-02-16 DIAGNOSIS — E139 Other specified diabetes mellitus without complications: Secondary | ICD-10-CM

## 2023-02-16 DIAGNOSIS — D649 Anemia, unspecified: Secondary | ICD-10-CM | POA: Diagnosis not present

## 2023-02-16 DIAGNOSIS — C183 Malignant neoplasm of hepatic flexure: Secondary | ICD-10-CM | POA: Diagnosis present

## 2023-02-16 DIAGNOSIS — C184 Malignant neoplasm of transverse colon: Secondary | ICD-10-CM | POA: Insufficient documentation

## 2023-02-16 DIAGNOSIS — IMO0001 Reserved for inherently not codable concepts without codable children: Secondary | ICD-10-CM

## 2023-02-16 DIAGNOSIS — Z01818 Encounter for other preprocedural examination: Secondary | ICD-10-CM

## 2023-02-16 DIAGNOSIS — R1900 Intra-abdominal and pelvic swelling, mass and lump, unspecified site: Secondary | ICD-10-CM | POA: Diagnosis not present

## 2023-02-16 DIAGNOSIS — Z531 Procedure and treatment not carried out because of patient's decision for reasons of belief and group pressure: Secondary | ICD-10-CM

## 2023-02-16 DIAGNOSIS — H9193 Unspecified hearing loss, bilateral: Secondary | ICD-10-CM | POA: Insufficient documentation

## 2023-02-16 DIAGNOSIS — Z8719 Personal history of other diseases of the digestive system: Secondary | ICD-10-CM | POA: Diagnosis not present

## 2023-02-16 DIAGNOSIS — Z7984 Long term (current) use of oral hypoglycemic drugs: Secondary | ICD-10-CM | POA: Diagnosis not present

## 2023-02-16 DIAGNOSIS — E119 Type 2 diabetes mellitus without complications: Secondary | ICD-10-CM | POA: Diagnosis not present

## 2023-02-16 DIAGNOSIS — G4733 Obstructive sleep apnea (adult) (pediatric): Secondary | ICD-10-CM | POA: Diagnosis present

## 2023-02-16 LAB — GLUCOSE, CAPILLARY: Glucose-Capillary: 117 mg/dL — ABNORMAL HIGH (ref 70–99)

## 2023-02-16 SURGERY — COLECTOMY, SIGMOID, ROBOT-ASSISTED
Anesthesia: General

## 2023-02-16 MED ORDER — ACETAMINOPHEN 500 MG PO TABS: 1000.0000 mg | ORAL_TABLET | ORAL | Status: DC

## 2023-02-16 MED ORDER — GABAPENTIN 300 MG PO CAPS
300.0000 mg | ORAL_CAPSULE | ORAL | Status: DC
  Filled 2023-02-16: qty 1

## 2023-02-16 MED ORDER — METRONIDAZOLE 500 MG PO TABS: 500.0000 mg | ORAL_TABLET | ORAL | 0 refills | Status: DC

## 2023-02-16 MED ORDER — CHLORHEXIDINE GLUCONATE 0.12 % MT SOLN: 15.0000 mL | Freq: Once | OROMUCOSAL | Status: DC

## 2023-02-16 MED ORDER — LACTATED RINGERS IV SOLN: INTRAVENOUS | Status: DC

## 2023-02-16 MED ORDER — NEOMYCIN SULFATE 500 MG PO TABS: 1000.0000 mg | ORAL_TABLET | ORAL | 0 refills | Status: DC

## 2023-02-16 MED ORDER — POLYETHYLENE GLYCOL 3350 17 GM/SCOOP PO POWD: 1.0000 | Freq: Once | ORAL | Status: DC

## 2023-02-16 MED ORDER — METRONIDAZOLE 500 MG PO TABS: 1000.0000 mg | ORAL_TABLET | ORAL | Status: DC

## 2023-02-16 MED ORDER — ENOXAPARIN SODIUM 40 MG/0.4ML IJ SOSY
40.0000 mg | PREFILLED_SYRINGE | Freq: Once | INTRAMUSCULAR | Status: DC
  Filled 2023-02-16: qty 0.4

## 2023-02-16 MED ORDER — BISACODYL 5 MG PO TBEC: 20.0000 mg | DELAYED_RELEASE_TABLET | Freq: Once | ORAL | Status: DC

## 2023-02-16 MED ORDER — NEOMYCIN SULFATE 500 MG PO TABS: 1000.0000 mg | ORAL_TABLET | ORAL | Status: DC

## 2023-02-16 MED ORDER — ENSURE PRE-SURGERY PO LIQD: 592.0000 mL | Freq: Once | ORAL | Status: DC

## 2023-02-16 MED ORDER — ORAL CARE MOUTH RINSE: 15.0000 mL | Freq: Once | OROMUCOSAL | Status: DC

## 2023-02-16 MED ORDER — ENSURE PRE-SURGERY PO LIQD: 296.0000 mL | Freq: Once | ORAL | Status: DC

## 2023-02-16 MED ORDER — ACETAMINOPHEN 500 MG PO TABS
1000.0000 mg | ORAL_TABLET | Freq: Once | ORAL | Status: DC
  Filled 2023-02-16: qty 2

## 2023-02-16 MED ORDER — BUPIVACAINE LIPOSOME 1.3 % IJ SUSP: 20.0000 mL | Freq: Once | INTRAMUSCULAR | Status: DC

## 2023-02-16 MED ORDER — ALVIMOPAN 12 MG PO CAPS
12.0000 mg | ORAL_CAPSULE | ORAL | Status: DC
  Filled 2023-02-16: qty 1

## 2023-02-16 MED ORDER — SODIUM CHLORIDE 0.9 % IV SOLN
2.0000 g | INTRAVENOUS | Status: DC
  Filled 2023-02-16: qty 2

## 2023-02-16 SURGICAL SUPPLY — 116 items
APL PRP STRL LF DISP 70% ISPRP (MISCELLANEOUS)
APPLIER CLIP 5 13 M/L LIGAMAX5 (MISCELLANEOUS)
APPLIER CLIP ROT 10 11.4 M/L (STAPLE)
APR CLP MED LRG 11.4X10 (STAPLE)
APR CLP MED LRG 5 ANG JAW (MISCELLANEOUS)
BAG COUNTER SPONGE SURGICOUNT (BAG) ×3 IMPLANT
BAG SPNG CNTER NS LX DISP (BAG) ×2
BLADE EXTENDED COATED 6.5IN (ELECTRODE) IMPLANT
CANNULA REDUC XI 12-8 STAPL (CANNULA)
CANNULA REDUCER 12-8 DVNC XI (CANNULA) IMPLANT
CELLS DAT CNTRL 66122 CELL SVR (MISCELLANEOUS) IMPLANT
CHLORAPREP W/TINT 26 (MISCELLANEOUS) IMPLANT
CLIP APPLIE 5 13 M/L LIGAMAX5 (MISCELLANEOUS) IMPLANT
CLIP APPLIE ROT 10 11.4 M/L (STAPLE) IMPLANT
COVER SURGICAL LIGHT HANDLE (MISCELLANEOUS) ×6 IMPLANT
COVER TIP SHEARS 8 DVNC (MISCELLANEOUS) ×3 IMPLANT
COVER TIP SHEARS 8MM DA VINCI (MISCELLANEOUS) ×2
DEVICE TROCAR PUNCTURE CLOSURE (ENDOMECHANICALS) IMPLANT
DRAIN CHANNEL 19F RND (DRAIN) IMPLANT
DRAPE ARM DVNC X/XI (DISPOSABLE) ×12 IMPLANT
DRAPE COLUMN DVNC XI (DISPOSABLE) ×3 IMPLANT
DRAPE DA VINCI XI ARM (DISPOSABLE) ×8
DRAPE DA VINCI XI COLUMN (DISPOSABLE) ×2
DRAPE SURG IRRIG POUCH 19X23 (DRAPES) ×3 IMPLANT
DRSG OPSITE POSTOP 4X10 (GAUZE/BANDAGES/DRESSINGS) IMPLANT
DRSG OPSITE POSTOP 4X6 (GAUZE/BANDAGES/DRESSINGS) IMPLANT
DRSG OPSITE POSTOP 4X8 (GAUZE/BANDAGES/DRESSINGS) IMPLANT
DRSG TEGADERM 2-3/8X2-3/4 SM (GAUZE/BANDAGES/DRESSINGS) ×15 IMPLANT
DRSG TEGADERM 4X4.75 (GAUZE/BANDAGES/DRESSINGS) IMPLANT
ELECT PENCIL ROCKER SW 15FT (MISCELLANEOUS) ×3 IMPLANT
ELECT REM PT RETURN 15FT ADLT (MISCELLANEOUS) ×3 IMPLANT
ENDOLOOP SUT PDS II  0 18 (SUTURE)
ENDOLOOP SUT PDS II 0 18 (SUTURE) IMPLANT
EVACUATOR SILICONE 100CC (DRAIN) IMPLANT
GAUZE SPONGE 2X2 8PLY STRL LF (GAUZE/BANDAGES/DRESSINGS) ×3 IMPLANT
GLOVE ECLIPSE 8.0 STRL XLNG CF (GLOVE) ×9 IMPLANT
GLOVE INDICATOR 8.0 STRL GRN (GLOVE) ×9 IMPLANT
GOWN SRG XL LVL 4 BRTHBL STRL (GOWNS) ×3 IMPLANT
GOWN STRL NON-REIN XL LVL4 (GOWNS) ×2
GOWN STRL REUS W/ TWL XL LVL3 (GOWN DISPOSABLE) ×12 IMPLANT
GOWN STRL REUS W/TWL XL LVL3 (GOWN DISPOSABLE) ×8
GRASPER SUT TROCAR 14GX15 (MISCELLANEOUS) IMPLANT
HOLDER FOLEY CATH W/STRAP (MISCELLANEOUS) ×3 IMPLANT
IRRIG SUCT STRYKERFLOW 2 WTIP (MISCELLANEOUS) ×2
IRRIGATION SUCT STRKRFLW 2 WTP (MISCELLANEOUS) ×3 IMPLANT
KIT PROCEDURE DA VINCI SI (MISCELLANEOUS)
KIT PROCEDURE DVNC SI (MISCELLANEOUS) IMPLANT
KIT SIGMOIDOSCOPE (SET/KITS/TRAYS/PACK) IMPLANT
KIT TURNOVER KIT A (KITS) IMPLANT
NDL INSUFFLATION 14GA 120MM (NEEDLE) ×1 IMPLANT
NEEDLE INSUFFLATION 14GA 120MM (NEEDLE) ×2 IMPLANT
PACK CARDIOVASCULAR III (CUSTOM PROCEDURE TRAY) ×3 IMPLANT
PACK COLON (CUSTOM PROCEDURE TRAY) ×3 IMPLANT
PAD POSITIONING PINK XL (MISCELLANEOUS) ×3 IMPLANT
PROTECTOR NERVE ULNAR (MISCELLANEOUS) ×6 IMPLANT
RELOAD STAPLE 45 3.5 BLU DVNC (STAPLE) IMPLANT
RELOAD STAPLE 45 4.3 GRN DVNC (STAPLE) IMPLANT
RELOAD STAPLE 60 3.5 BLU DVNC (STAPLE) IMPLANT
RELOAD STAPLE 60 4.3 GRN DVNC (STAPLE) IMPLANT
RELOAD STAPLER 3.5X45 BLU DVNC (STAPLE) IMPLANT
RELOAD STAPLER 3.5X60 BLU DVNC (STAPLE) IMPLANT
RELOAD STAPLER 4.3X45 GRN DVNC (STAPLE) IMPLANT
RELOAD STAPLER 4.3X60 GRN DVNC (STAPLE) IMPLANT
RETRACTOR WND ALEXIS 18 MED (MISCELLANEOUS) IMPLANT
RTRCTR WOUND ALEXIS 18CM MED (MISCELLANEOUS)
SCISSORS LAP 5X35 DISP (ENDOMECHANICALS) ×3 IMPLANT
SEAL UNIV 5-12 XI (MISCELLANEOUS) ×12 IMPLANT
SEAL XI UNIVERSAL 5-12 (MISCELLANEOUS) ×8
SEALER VESSEL DA VINCI XI (MISCELLANEOUS) ×2
SEALER VESSEL EXT DVNC XI (MISCELLANEOUS) ×3 IMPLANT
SOL ELECTROSURG ANTI STICK (MISCELLANEOUS) ×2
SOLUTION ELECTROSURG ANTI STCK (MISCELLANEOUS) ×3 IMPLANT
SPIKE FLUID TRANSFER (MISCELLANEOUS) ×3 IMPLANT
STAPLER 45 DA VINCI SURE FORM (STAPLE)
STAPLER 45 SUREFORM DVNC (STAPLE) IMPLANT
STAPLER 60 DA VINCI SURE FORM (STAPLE)
STAPLER 60 SUREFORM DVNC (STAPLE) IMPLANT
STAPLER ECHELON POWER CIR 29 (STAPLE) IMPLANT
STAPLER ECHELON POWER CIR 31 (STAPLE) IMPLANT
STAPLER RELOAD 3.5X45 BLU DVNC (STAPLE)
STAPLER RELOAD 3.5X45 BLUE (STAPLE)
STAPLER RELOAD 3.5X60 BLU DVNC (STAPLE)
STAPLER RELOAD 3.5X60 BLUE (STAPLE)
STAPLER RELOAD 4.3X45 GREEN (STAPLE)
STAPLER RELOAD 4.3X45 GRN DVNC (STAPLE)
STAPLER RELOAD 4.3X60 GREEN (STAPLE)
STAPLER RELOAD 4.3X60 GRN DVNC (STAPLE)
STOPCOCK 4 WAY LG BORE MALE ST (IV SETS) ×6 IMPLANT
SURGILUBE 2OZ TUBE FLIPTOP (MISCELLANEOUS) IMPLANT
SUT MNCRL AB 4-0 PS2 18 (SUTURE) ×3 IMPLANT
SUT PDS AB 1 CT1 27 (SUTURE) ×6 IMPLANT
SUT PROLENE 0 CT 2 (SUTURE) IMPLANT
SUT PROLENE 2 0 KS (SUTURE) IMPLANT
SUT PROLENE 2 0 SH DA (SUTURE) IMPLANT
SUT SILK 2 0 (SUTURE)
SUT SILK 2 0 SH CR/8 (SUTURE) IMPLANT
SUT SILK 2-0 18XBRD TIE 12 (SUTURE) IMPLANT
SUT SILK 3 0 (SUTURE)
SUT SILK 3 0 SH CR/8 (SUTURE) ×3 IMPLANT
SUT SILK 3-0 18XBRD TIE 12 (SUTURE) IMPLANT
SUT V-LOC BARB 180 2/0GR6 GS22 (SUTURE)
SUT VIC AB 3-0 SH 18 (SUTURE) IMPLANT
SUT VIC AB 3-0 SH 27 (SUTURE)
SUT VIC AB 3-0 SH 27XBRD (SUTURE) IMPLANT
SUT VICRYL 0 UR6 27IN ABS (SUTURE) ×3 IMPLANT
SUTURE V-LC BRB 180 2/0GR6GS22 (SUTURE) IMPLANT
SYR 20ML ECCENTRIC (SYRINGE) ×3 IMPLANT
SYS LAPSCP GELPORT 120MM (MISCELLANEOUS)
SYS WOUND ALEXIS 18CM MED (MISCELLANEOUS) ×2
SYSTEM LAPSCP GELPORT 120MM (MISCELLANEOUS) IMPLANT
SYSTEM WOUND ALEXIS 18CM MED (MISCELLANEOUS) ×3 IMPLANT
TOWEL OR NON WOVEN STRL DISP B (DISPOSABLE) ×3 IMPLANT
TRAY FOLEY MTR SLVR 16FR STAT (SET/KITS/TRAYS/PACK) ×3 IMPLANT
TROCAR ADV FIXATION 5X100MM (TROCAR) ×3 IMPLANT
TUBING CONNECTING 10 (TUBING) ×6 IMPLANT
TUBING INSUFFLATION 10FT LAP (TUBING) ×3 IMPLANT

## 2023-02-16 NOTE — H&P (Signed)
02/16/2023     REFERRING PHYSICIAN: Manus Gunning*  Patient Care Team: Cain Sieve Heywood Iles, MD as PCP - General (Internal Medicine) Armbruster, Renelda Loma, MD (Gastroenterology) Johney Maine, Adrian Saran, MD as Consulting Provider (General Surgery) Truitt Merle, MD (Hematology and Oncology) Cain Sieve Heywood Iles, MD (Internal Medicine)  PROVIDER: Hollace Kinnier, MD  [facility] MRN: [id number] DOB: [birth year]  SUBJECTIVE  Chief Complaint: New Consultation ( Colon mass, )   [name] is a 52 y.o. female who is seen today as an office consultation at the request of DrHavery Moros for evaluation of cancer of transverse colon.  History of Present Illness:  52 year old woman. Has had anemia. Found to be iron deficient. Received IV iron and EPO. Refuses transfusions for religious reasons. Request made to see gastroenterology last summer. Eventually followed up and had colonoscopy 2 weeks ago by Dr. Havery Moros. Mass noted in proximal transverse colon. Biopsy consistent with adenocarcinoma. Cystic mass in pelvis most likely consistent with ovarian cyst noted as well. Surgical medical oncology consultations requested.  Patient comes today with her sister who helps speak for her given her severe deafness needing to read lips. Patient had an abdominal hysterectomy and bilateral salpingectomy over a decade ago. Patient moves her bowels every day. She does not smoke. She does have diabetes primarily on oral hypoglycemics but may be on insulin or at least Victoza as well. She can walk maybe 10 minutes before she gets short of breath. No exertional chest pain. She is on Coreg but does not have a cardiologist as far as I can tell. They were describing the teaching service at [facility] health.  Medical History:  Past Medical History: Diagnosis Date Anemia Diabetes mellitus without complication (CMS-HCC) Sleep apnea  Patient Active Problem List Diagnosis Malignant neoplasm of  hepatic flexure (CMS-HCC) History of colonic polyps Pelvic mass in female Transfusion of blood product refused for religious reason Bilateral deafness Diabetes mellitus type 2, noninsulin dependent (CMS-HCC)  Past Surgical History: Procedure Laterality Date HYSTERECTOMY   Allergies Allergen Reactions Sulfamethoxazole-Trimethoprim Anaphylaxis, Shortness Of Breath and Swelling Lisinopril Cough Losartan Cough Iodinated Contrast Media Nausea And Vomiting Sulfasalazine Swelling Eyes and lips swelled up  Current Outpatient Medications on File Prior to Visit Medication Sig Dispense Refill atorvastatin (LIPITOR) 40 MG tablet Take 40 mg by mouth once daily carvediloL (COREG) 6.25 MG tablet Take by mouth DULoxetine (CYMBALTA) 30 MG DR capsule Take by mouth guaiFENesin (MUCINEX) 600 mg SR tablet Take 600 mg by mouth 2 (two) times daily metFORMIN (GLUCOPHAGE) 1000 MG tablet Take 1 tablet by mouth 2 (two) times daily naproxen (NAPROSYN) 500 MG tablet Take by mouth VICTOZA 2-PAK 0.6 mg/0.1 mL (18 mg/3 mL) pen injector Inject subcutaneously  No current facility-administered medications on file prior to visit.  Family History Problem Relation Age of Onset Breast cancer Mother Diabetes Mother High blood pressure (Hypertension) Mother   Social History  Tobacco Use Smoking Status Never Smokeless Tobacco Never   Social History  Socioeconomic History Marital status: Married Tobacco Use Smoking status: Never Smokeless tobacco: Never Substance and Sexual Activity Alcohol use: Not Currently Drug use: Never  ############################################################  Review of Systems: A complete review of systems (ROS) was obtained from the patient. We have reviewed this information and discussed as appropriate with the patient. See HPI as well for other pertinent ROS.  Constitutional: No fevers, chills, sweats. Weight stable Eyes: No vision changes, No discharge HENT: No  sore throats, nasal drainage Lymph: No neck swelling, No bruising easily Pulmonary: No  cough, productive sputum CV: No orthopnea, PND . No exertional chest/neck/shoulder/arm pain. Patient can walk 20 minutes without difficulty.  GI: No personal nor family history of inflammatory bowel disease, irritable bowel syndrome, allergy such as Celiac Sprue, dietary/dairy problems, colitis, ulcers nor gastritis. No recent sick contacts/gastroenteritis. No travel outside the country. No changes in diet.  Renal: No UTIs, No hematuria Genital: No drainage, bleeding, masses Musculoskeletal: No severe joint pain. Good ROM major joints Skin: No sores or lesions Heme/Lymph: No easy bleeding. No swollen lymph nodes. Chronic anemia iron deficient. Getting iron infusions. Refuses blood products for religious reasons. Neuro: No active seizures. No facial droop Psych: No hallucinations. No agitation  OBJECTIVE  Vitals: 12/28/22 1111 BP: 120/80 Pulse: (!) 111 Temp: 36.1 C (97 F) SpO2: 98% Weight: 98 kg (216 lb) Height: 152.4 cm (5')  Body mass index is 42.18 kg/m.  PHYSICAL EXAM:  Constitutional: Not cachectic. Hygeine adequate. Vitals signs as above. Eyes: No glasses. Vision adequate,Pupils reactive, normal extraocular movements. Sclera nonicteric Neuro: CN II-XII intact. No major focal sensory defects. No major motor deficits. Lymph: No head/neck/groin lymphadenopathy Psych: No severe agitation. No severe anxiety. Judgment & insight Adequate, Oriented x4, HENT: Normocephalic, Mucus membranes moist. No thrush. Hearing: very poor Neck: Supple, No tracheal deviation. No obvious thyromegaly Chest: No pain to chest wall compression. Good respiratory excursion. No audible wheezing CV: Pulses intact. regular. No major extremity edema Ext: No obvious deformity or contracture. Edema: Not present. No cyanosis Skin: No major subcutaneous nodules. Warm and dry Musculoskeletal: Severe joint rigidity not  present. No obvious clubbing. No digital petechiae. Mobility: no assist device moving easily without restrictions  Abdomen: Obese Soft. Nondistended. Tenderness at umbilicus only. No hernia. [name] Hernia: Not present. Diastasis recti: Not present. No hepatomegaly. No splenomegaly.  Genital/Pelvic: Inguinal hernia: Not present. Inguinal lymph nodes: without lymphadenopathy nor hidradenitis.  Rectal: (Deferred)    ###################################################################  Labs, Imaging and Diagnostic Testing:  Located in 'Care Everywhere' section of Epic EMR chart  PRIOR [facility] CLINIC NOTES:  Not applicable  SURGERY NOTES:  Not applicable  PATHOLOGY:  Located in [place]' section of Epic EMR chart  Assessment and Plan: DIAGNOSES:  Diagnoses and all orders for this visit:  Malignant neoplasm of hepatic flexure (CMS-HCC)  History of colonic polyps  Pelvic mass in female  Transfusion of blood product refused for religious reason  Bilateral deafness  Diabetes mellitus type 2, noninsulin dependent (CMS-HCC)  Other orders - polyethylene glycol (MIRALAX) powder; Take 233.75 g by mouth once for 1 dose Take according to your procedure prep instructions. - bisacodyL (DULCOLAX) 5 mg EC tablet; Take 4 tablets (20 mg total) by mouth once daily as needed for Constipation for up to 1 dose - metroNIDAZOLE (FLAGYL) 500 MG tablet; Take 2 tablets (1,000 mg total) by mouth 3 (three) times daily for 3 doses SEE BOWEL PREP INSTRUCTIONS: Take 2 tablets at 2pm, 3pm, and 10pm the day prior to your colon operation. - neomycin 500 mg tablet; Take 2 tablets (1,000 mg total) by mouth 3 (three) times daily for 3 doses SEE BOWEL PREP INSTRUCTIONS: Take 2 tablets at 2pm, 3pm, and 10pm the day prior to your colon operation.    ASSESSMENT/PLAN  Pleasant woman with mass in the colon consistent with adenocarcinoma. Described as at hepatic flexure. Looking at CAT scan there are clips at  the ascending colon and at the splenic flexure. That is where polyps were removed by Dr. Havery Moros and the cancer was in between, NOT clipped.  To my view it looks like there is thickening of the colon in the redundant floppy proximal transverse colon. I recommended surgical segmental resection. Reasonable candidate for a robotic section with intracorporeal anastomosis.  The anatomy & physiology of the digestive tract was discussed. The pathophysiology of the colon was discussed. Natural history risks without surgery was discussed. I feel the risks of no intervention will lead to serious problems that outweigh the operative risks; therefore, I recommended a partial colectomy to remove the pathology. Minimally invasive (Robotic/Laparoscopic) & open techniques were discussed.  Risks such as bleeding, infection, abscess, leak, reoperation, injury to other organs, need for repair of tissues / organs, possible ostomy, hernia, heart attack, stroke, death, and other risks were discussed. I noted a good likelihood this will help address the problem. Goals of post-operative recovery were discussed as well. Need for adequate nutrition, daily bowel regimen and healthy physical activity, to optimize recovery was noted as well. We will work to minimize complications. Educational materials were available as well. Questions were answered. The patient expresses understanding & wishes to proceed with surgery.  She does have a cystic mass in the pelvis. Perhaps it is just a simple cyst but it is more than 5 cm. Dr. Burr Medico ordered pelvic ultrasound. We will see what those results show. I wonder if they wish to proceed with ovarian cystectomy if concerned & get Dr. Jeral Pinch with gynecological oncology involved.  She is rather anemic and getting IV iron infusions since she refuses blood products. They recall being told to delay surgery until she gets a few more rounds. Only that will not get under control until the  bleeding tumor is removed. Will double check with Dr. Burr Medico.  Patient felt comfortable reading my lips and discussing with her sister as well given her deafness.     Adin Hector, MD, FACS, MASCRS Esophageal, Gastrointestinal & Colorectal Surgery Robotic and Minimally Invasive Surgery  [facility] [id number] [place], [place] [address] [phone] Fax [phone] Main  CONTACT INFORMATION:  Weekday (9AM-5PM): Call [facility] main office at [phone]  Weeknight (5PM-9AM) or Weekend/Holiday: Check [email or web address] (password " [email or web address]") for General Surgery [facility] coverage  (Please, do not use SecureChat as it is not reliable communication to reach operating surgeons for immediate patient care given surgeries/outpatient duties/clinic/cross-coverage/off post-call which would lead to a delay in care.  Epic staff messaging available for outptient concerns, but may not be answered for 48 hours or more).    02/16/2023

## 2023-02-16 NOTE — Progress Notes (Signed)
Pt states she did not have her bowel prep; Dr Johney Maine aware and per MD , Surgery will be canceled.

## 2023-02-16 NOTE — Interval H&P Note (Signed)
History and Physical Interval Note:  02/16/2023 11:50 AM  [name]  has presented today for surgery, with the diagnosis of COLON CANCER.  The various methods of treatment have been discussed with the patient and family. After consideration of risks, benefits and other options for treatment, the patient has consented to    [place] (Dr Johney Maine) [place] (Bilateral) (Dr Berline Lopes  as a surgical intervention.  The patient's history has been reviewed, patient examined, no change in status, stable for surgery.  I have reviewed the patient's chart and labs.  Questions were answered to the patient's satisfaction.    I have re-reviewed the the patient's records, history, medications, and allergies.  I have re-examined the patient.  I again discussed intraoperative plans and goals of post-operative recovery.  The patient agrees to proceed.  [name]  November 05, 1971 [id number]  Patient Care Team: Nani Gasser, MD as PCP - General Shirley Muscat, Loreen Freud, MD as Referring Physician (Optometry) Truitt Merle, MD as Consulting Physician (Oncology) Michael Boston, MD as Consulting Physician (General Surgery) Armbruster, Carlota Raspberry, MD as Consulting Physician (Gastroenterology)  Patient Active Problem List   Diagnosis Date Noted   Bilateral deafness 12/28/2022   History of colonic polyps 12/28/2022   Pelvic mass in female 12/28/2022   Malignant neoplasm of hepatic flexure 12/26/2022   Healthcare maintenance 12/03/2022   Nasal congestion 12/03/2022   URI (upper respiratory infection) 11/22/2022   Bloody stool 11/17/2022   Chronic diarrhea of unknown origin 10/03/2022   OSA (obstructive sleep apnea) 08/02/2022   Blood transfusion declined because patient is Jehovah's Witness    Asymmetric SNHL (sensorineural hearing loss) 04/02/2022   Rhinitis, nonallergic 10/16/2021   Sore throat 10/13/2021   Need for pneumococcal vaccination  02/19/2021   Hypertension 03/21/2018   Allergic rhinitis 01/25/2018   Snoring 06/10/2017   Congenital hearing loss of right ear 12/08/2016   Schizoaffective disorder, chronic condition 06/18/2016   Iron deficiency anemia 03/04/2015   Hyperlipidemia LDL goal <70 09/10/2014   Morbid obesity with BMI of 40.0-44.9, adult 07/30/2014   Pap smear for cervical cancer screening 07/30/2014   Diabetes mellitus type 2, noninsulin dependent 11/07/2012    Past Medical History:  Diagnosis Date   Anemia    Anxiety    Congenital cerebral palsy ([place])    Depression    Diabetes mellitus due to abnormal insulin ([place])    Dyspnea    Edema 06/10/2017   Endometriosis    Hearing impairment    Heart murmur    at birth   Hypertension    Neuromuscular disorder ([place])    Cerebral palsy- very mild left side of brain   Primary cancer of hepatic flexure of colon (HCC)    Schizoaffective disorder ([place])    Sleep apnea    no cpap   Thyroid nodule 07/30/2014   CT scan from 08/27/2010: A 1.9 cm calcified nodule involving the lower pole of the right lobe of the thyroid gland. US Neck 11/06/14: Bilateral nodules. Dominant right lower pole nodule measures 2.3 cm. Findings meet consensus criteria for biopsy. Ultrasound-guided fine needle aspiration should be considered Aspiration pathology: benign follicular nodule    Past Surgical History:  Procedure Laterality Date   CESAREAN SECTION     EXPLORATORY LAPAROTOMY WITH ABDOMINAL MASS EXCISION     MYOMECTOMY N/A 08/05/2015   Procedure: Abdominal Hysterectomy, Bilateral Salpingectomy;  Surgeon: Osborne Oman, MD;  Location: [facility];  Service: Gynecology;  Laterality: N/A;  Social History   Socioeconomic History   Marital status: Married    Spouse name: Not on file   Number of children: 1   Years of education: Not on file   Highest education level: Not on file  Occupational History   Not on file  Tobacco Use   Smoking status: Never   Smokeless tobacco:  Never  Vaping Use   Vaping Use: Never used  Substance and Sexual Activity   Alcohol use: No   Drug use: No   Sexual activity: Not Currently    Birth control/protection: Surgical  Other Topics Concern   Not on file  Social History Narrative   Not on file   Social Determinants of Health   Financial Resource Strain: Low Risk  (04/16/2022)   Overall Financial Resource Strain (CARDIA)    Difficulty of Paying Living Expenses: Not hard at all  Food Insecurity: No Food Insecurity (12/03/2022)   Hunger Vital Sign    Worried About Running Out of Food in the Last Year: Never true    Ran Out of Food in the Last Year: Never true  Transportation Needs: Unmet Transportation Needs (12/03/2022)   PRAPARE - Transportation    Lack of Transportation (Medical): No    Lack of Transportation (Non-Medical): Yes  Physical Activity: Inactive (04/16/2022)   Exercise Vital Sign    Days of Exercise per Week: 0 days    Minutes of Exercise per Session: 0 min  Stress: No Stress Concern Present (04/16/2022)   [employer] [place]    Feeling of Stress : Only a little  Social Connections: Moderately Isolated (12/03/2022)   Social Connection and Isolation Panel [NHANES]    Frequency of Communication with Friends and Family: More than three times a week    Frequency of Social Gatherings with Friends and Family: More than three times a week    Attends Religious Services: More than 4 times per year    Active Member of [employer] or Organizations: No    Attends Archivist Meetings: Never    Marital Status: Separated  Intimate Partner Violence: Not At Risk (12/03/2022)   Humiliation, Afraid, Rape, and Kick questionnaire    Fear of Current or Ex-Partner: No    Emotionally Abused: No    Physically Abused: No    Sexually Abused: No    Family History  Problem Relation Age of Onset   Diabetes Mother    Breast cancer Mother    Cancer Mother 90       breast  cancer   Heart failure Mother    Hypertension Mother    Diabetes Mellitus II Mother    Cancer Father 81       colon cancer   Diabetes Mellitus II Father    Hypertension Father    Cancer Maternal Aunt 32       breast cancer   Cancer Maternal Grandmother        lung cancer   Colon cancer Neg Hx    Esophageal cancer Neg Hx    Pancreatic cancer Neg Hx    Ulcerative colitis Neg Hx     Medications Prior to Admission  Medication Sig Dispense Refill Last Dose   acetaminophen (TYLENOL) 500 MG tablet Take 1,000 mg by mouth every 6 (six) hours as needed for moderate pain.      amLODipine (NORVASC) 10 MG tablet Take 1 tablet (10 mg total) by mouth daily. 30 tablet 11    DULoxetine (  CYMBALTA) 30 MG capsule Take 1 capsule (30 mg total) by mouth daily. 30 capsule 3    ferrous sulfate 325 (65 FE) MG EC tablet Take 1 tablet (325 mg total) by mouth every other day. 15 tablet 2    fluticasone (FLONASE) 50 MCG/ACT nasal spray Place 2 sprays into both nostrils daily as needed for allergies or rhinitis.      guaiFENesin (MUCINEX) 600 MG 12 hr tablet Take 1 tablet (600 mg total) by mouth 2 (two) times daily. (Patient taking differently: Take 600 mg by mouth 2 (two) times daily as needed for cough or to loosen phlegm.) 14 tablet 1    liraglutide (VICTOZA) 18 MG/3ML SOPN Inject 1.8 mg into the skin daily. 9 mL 5    metFORMIN (GLUCOPHAGE) 1000 MG tablet Take 1 tablet (1,000 mg total) by mouth 2 (two) times daily. 180 tablet 3    Multiple Vitamins-Minerals (HAIR SKIN & NAILS) TABS Take 1 tablet by mouth daily.      omeprazole (PRILOSEC) 40 MG capsule Take 1 capsule (40 mg total) by mouth daily. 30 capsule 1    Paliperidone ER (INVEGA SUSTENNA) injection Inject 117 mg into the muscle every 30 (thirty) days.      atorvastatin (LIPITOR) 40 MG tablet TAKE 1 TABLET BY MOUTH DAILY 90 tablet 3    benzonatate (TESSALON PERLES) 100 MG capsule Take 1 capsule (100 mg total) by mouth 3 (three) times daily as needed for  cough. (Patient not taking: Reported on 12/30/2022) 20 capsule 0 Not Taking   carvedilol (COREG) 6.25 MG tablet TAKE 1 TABLET BY MOUTH TWICE A DAY WITH MEALS 60 tablet 3    diphenhydrAMINE (BENADRYL) 50 MG tablet Take 50 mg of Benadryl 1 hour prior to your CT scan 1 tablet 0    Insulin Pen Needle (BD PEN NEEDLE NANO U/F) 32G X 4 MM MISC USE TO INJECT VICTOZA ONCE DAILY 100 each 1    naproxen (NAPROSYN) 500 MG tablet Take 1 tablet (500 mg total) by mouth 2 (two) times daily with a meal. (Patient not taking: Reported on 12/30/2022) 30 tablet 0 Not Taking   predniSONE (DELTASONE) 50 MG tablet Take (1) 50 mg tablet of prednisone 13 hours prior to your procedure , Take (1) 50 mg tablet 7 hours prior to your procedure , and Take (1) 50 mg tablet 1 hour prior to your procedure 3 tablet 0    predniSONE (DELTASONE) 50 MG tablet Take (1) 50 mg tablet of prednisone 13 hours prior to your procedure, take (1) 50 mg tablet 7 hours prior to your procedure, and take (1) 50 mg tablet of prednisone 1 hour prior to your procedure 3 tablet 0     No current facility-administered medications for this encounter.     Allergies  Allergen Reactions   Bactrim [Sulfamethoxazole-Trimethoprim] Anaphylaxis, Shortness Of Breath and Swelling   Lisinopril Cough   Losartan Cough   Bee Pollen Other (See Comments)    Seasonal allergies   Ivp Dye [Iodinated Contrast Media] Nausea And Vomiting   Metrizamide Nausea And Vomiting   Pollen Extract     Seasonal allergies   Sulfa Antibiotics Swelling    Eyes and lips swelled up   Sulfasalazine Swelling    Eyes and lips swelled up   Other     Blood Product Refusal    Peanut-Containing Drug Products Rash    LMP 07/23/2015 (Exact Date)   Labs: No results found for this or any previous visit (from the  past 48 hour(s)).  Imaging / Studies: No results found.   Adin Hector, M.D., F.A.C.S. Gastrointestinal and Minimally Invasive Surgery [facility] [address], [place], [place] [address] [phone] Main / Paging  02/16/2023 11:50 AM    Adin Hector

## 2023-02-16 NOTE — Discharge Instructions (Addendum)
Surgery needs to be rescheduled  ASAPuntil a proper bowel prep with antibiotics is done.  Below are instructions for the bowel prep again.  We have documentation prescriptions were sent to [facility] February 13.  Dr. Johney Maine sent prescriptions yet again today.  Please pick them up and follow instructions below.  Please call [facility] Surgery office [phone].  We need a contact person besides the patient who can help ensure the bowel prep happens.  Whether this is the patient's sister/family, case manager, etc.; please let us know  [place] TO [place] supplies for the bowel prep at a pharmacy of your choice  Prescription was sent to [facility] February 13.  Repeat prescription sent 4/3: Office-e-mailed prescriptions for your antibiotic pills (Neomycin & Metronidazole)  A bottle of MiraLax / Glycolax (238g) - no prescription required  A large bottle of Gatorade / Powerade (64oz) - - no prescription required  Dulcolax tablets (4 tablets) - no prescription required      DAY PRIOR TO SURGERY   Switch to a liquid diet the day before surgery Drink plenty of liquids all day to avoid getting dehydrated  If you are having nausea or vomiting, hold the neomycin antibiotic & just take metronidazole tablets.  7:00am Swallow 4 Dulcolax tablets with some water  10:00am Mix the bottle of MiraLax with the 64-oz bottle of Gatorade.   Drink the Gatorade/Miralax mixture gradually (8oz glass every 15 minutes) until gone. (You should finish in 4 hours)  2:00pm Take 2 Neomycin 500mg  tablets & 2 Metronidazole 500mg  tablets  3:00pm Take 2 Neomycin 500mg  tablets & 2 Metronidazole 500mg  tablets  Drink plenty of clear liquids all evening to avoid getting dehydrated  10:00pm Take 2 Neomycin 500mg  tablets & 2 Metronidazole 500mg  tablets  Drink 2 Carbohydrate loading nutrition drinks (ex: Ensure Presurgery) Do not eat anything solid after  bedtime (midnight) the night before your surgery.   BUT DO drink plenty of clear liquids (Water, Gatorade, juice, soda, coffee, tea, broths, etc.) up to 2 hours prior to surgery to avoid getting dehydrated.      MORNING OF SURGERY  Remember to not to eat anything solid that morning  Drink one final carbohydrate loading nutritional drink (ex: Ensure Presurgery) upon waking up in the morning Hold or take medications as recommended by the hospital staff at your Preoperative visit Stop drinking liquids before you leave the house (>2 hours prior to surgery)    If you have questions or concerns, please call [facility] [phone] during business hours to speak to the clinical staff for advice.

## 2023-02-16 NOTE — Progress Notes (Addendum)
Patient arrived for surgery today.  Bowel prep not done.  Antibiotics not taken.  Patient and her sister deny getting instructions or getting prescriptions.  There is documentation that we have given instructions when I met them February 13.  Prescription for neomycin/metronidazole sent that day.  There is documentation our office called 2 days ago to go over instructions by our office nurse, Abigail Butts.  And looks like preop reached out to make sure bowel prep instructions were discussed as well.  Our office RN, Abigail Butts. reached out to the case manager.  Her note states:  "FYI: Spoke with [place], RN, psychiatric nurse through pt's ACT team, ([place] [phone]) who reports, both he and Mateo Flow, Education officer, museum with ACT team had reviewed the bowel prep with patient twice each. Patient's father and son were also aware of bowel prep, but [place] is not sure, how much was understood by family.    [place]ali, [place] will be glad to arrange to see patient on the day of her bowel prep to help patient follow the bowel prep if needed.  Abigail Butts"  Colectomy cannot proceed until bowel prep is properly done.  I tried to encourage the patient sister to go over the paperwork that we given to her in the office February 13.  The bowel prep is again in the print out and will be given with the AVS today.  I again sent prescriptions for antibiotics.  I will have the office call in the next day or so to make sure instructions are confirmed by the patient, her sister, and possible even case management so this does not get dropped.  Sounds like we need to have [place] help the patient get through the bowel prep, and make sure that the patient's sister and not just the father and son know about the bowel prep.  Adin Hector, MD, FACS, MASCRS Esophageal, Gastrointestinal & Colorectal Surgery Robotic and Minimally Invasive Surgery  [facility] [id number] [place], [place]  [address] [phone] Fax [phone] Main  CONTACT INFORMATION:  Weekday (9AM-5PM): Call [facility] main office at [phone]  Weeknight (5PM-9AM) or Weekend/Holiday: Check [email or web address] (password " [email or web address]") for General Surgery [facility] coverage  (Please, do not use SecureChat as it is not reliable communication to reach operating surgeons for immediate patient care given surgeries/outpatient duties/clinic/cross-coverage/off post-call which would lead to a delay in care.  Epic staff messaging available for outptient concerns, but may not be answered for 48 hours or more).

## 2023-02-22 ENCOUNTER — Ambulatory Visit (HOSPITAL_COMMUNITY): Admission: RE | Admit: 2023-02-22 | Payer: Medicare Other | Source: Ambulatory Visit

## 2023-03-03 ENCOUNTER — Other Ambulatory Visit: Payer: Self-pay | Admitting: Student

## 2023-03-03 NOTE — Telephone Encounter (Signed)
Iron replete.  No need for further supplementation at this time.

## 2023-03-07 ENCOUNTER — Telehealth: Payer: Self-pay | Admitting: Hematology

## 2023-03-07 NOTE — Patient Instructions (Addendum)
SURGICAL WAITING ROOM VISITATION  Patients having surgery or a procedure may have no more than 2 support people in the waiting area - these visitors may rotate.    Children under the age of 62 must have an adult with them who is not the patient.  Due to an increase in RSV and influenza rates and associated hospitalizations, children ages 69 and under may not visit patients in [facility] hospitals.  If the patient needs to stay at the hospital during part of their recovery, the visitor guidelines for inpatient rooms apply. Pre-op nurse will coordinate an appropriate time for 1 support person to accompany patient in pre-op.  This support person may not rotate.    Please refer to the [facility] website for the visitor guidelines for Inpatients (after your surgery is over and you are in a regular room).    Your procedure is scheduled on: 03/24/23   Report to [facility] Main Entrance    Report to admitting at 5:15 AM   Call this number if you have problems the morning of surgery [phone]   Follow a clear liquid diet the day before surgery.   After Midnight you may have the following liquids until 4:30 AM DAY OF SURGERY  Water Non-Citrus Juices (without pulp, NO RED-Apple, White grape, White cranberry) Black Coffee (NO MILK/CREAM OR CREAMERS, sugar ok)  Clear Tea (NO MILK/CREAM OR CREAMERS, sugar ok) regular and decaf                             Plain Jell-O (NO RED)                                           Fruit ices (not with fruit pulp, NO RED)                                     Popsicles (NO RED)                                                               Sports drinks like Gatorade (NO RED)              Drink 2 G2 drinks AT 10:00 PM the night before surgery.        The day of surgery:  Drink ONE (1) Pre-Surgery G2 at 4:30 AM the morning of surgery. Drink in one sitting. Do not sip.  This drink was given to you during your hospital  pre-op appointment  visit. Nothing else to drink after completing the  Pre-Surgery G2.          If you have questions, please contact your surgeon's office.   FOLLOW BOWEL PREP AND ANY ADDITIONAL PRE OP INSTRUCTIONS YOU RECEIVED FROM YOUR SURGEON'S OFFICE!!!     Oral Hygiene is also important to reduce your risk of infection.  Remember - BRUSH YOUR TEETH THE MORNING OF SURGERY WITH YOUR REGULAR TOOTHPASTE  DENTURES WILL BE REMOVED PRIOR TO SURGERY PLEASE DO NOT APPLY "Poly grip" OR ADHESIVES!!!   Take these medicines the morning of surgery with A SIP OF WATER: Tylenol, Amlodipine, Atorvastatin, Carvedilol, Cymbalta, Flagyl, Neomycin, Prilosec  DO NOT TAKE ANY ORAL DIABETIC MEDICATIONS DAY OF YOUR SURGERY  How to Manage Your Diabetes Before and After Surgery  Why is it important to control my blood sugar before and after surgery? Improving blood sugar levels before and after surgery helps healing and can limit problems. A way of improving blood sugar control is eating a healthy diet by:  Eating less sugar and carbohydrates  Increasing activity/exercise  Talking with your doctor about reaching your blood sugar goals High blood sugars (greater than 180 mg/dL) can raise your risk of infections and slow your recovery, so you will need to focus on controlling your diabetes during the weeks before surgery. Make sure that the doctor who takes care of your diabetes knows about your planned surgery including the date and location.  How do I manage my blood sugar before surgery? Check your blood sugar at least 4 times a day, starting 2 days before surgery, to make sure that the level is not too high or low. Check your blood sugar the morning of your surgery when you wake up and every 2 hours until you get to the Short Stay unit. If your blood sugar is less than 70 mg/dL, you will need to treat for low blood sugar: Do not take insulin. Treat a low blood sugar (less than 70  mg/dL) with  cup of clear juice (cranberry or apple), 4 glucose tablets, OR glucose gel. Recheck blood sugar in 15 minutes after treatment (to make sure it is greater than 70 mg/dL). If your blood sugar is not greater than 70 mg/dL on recheck, call [phone] for further instructions. Report your blood sugar to the short stay nurse when you get to Short Stay.  If you are admitted to the hospital after surgery: Your blood sugar will be checked by the staff and you will probably be given insulin after surgery (instead of oral diabetes medicines) to make sure you have good blood sugar levels. The goal for blood sugar control after surgery is 80-180 mg/dL.   WHAT DO I DO ABOUT MY DIABETES MEDICATION?  Do not take oral diabetes medicines (pills) the morning of surgery.  Hold Victoza for 24 hours before surgery. DO NOT TAKE 03/23/23 or 03/24/23.  THE DAY BEFORE SURGERY, take Metformin as prescribed.      THE MORNING OF SURGERY, do not take Metformin.   Reviewed and Endorsed by [facility] Patient Education Committee, August 2015                               You may not have any metal on your body including hair pins, jewelry, and body piercing             Do not wear make-up, lotions, powders, perfumes, or deodorant  Do not wear nail polish including gel and S&S, artificial/acrylic nails, or any other type of covering on natural nails including finger and toenails. If you have artificial nails, gel coating, etc. that needs to be removed by a nail salon please have this removed prior to surgery or surgery may need to be canceled/ delayed if the surgeon/ anesthesia feels like they  are unable to be safely monitored.   Do not shave  48 hours prior to surgery.    Do not bring valuables to the hospital. [facility] IS NOT             RESPONSIBLE   FOR VALUABLES.   Contacts, glasses, dentures or bridgework may not be worn into surgery.   Bring small overnight bag day of surgery.   DO NOT  BRING YOUR HOME MEDICATIONS TO THE HOSPITAL. PHARMACY WILL DISPENSE MEDICATIONS LISTED ON YOUR MEDICATION LIST TO YOU DURING YOUR ADMISSION IN THE HOSPITAL!              Please read over the following fact sheets you were given: IF YOU HAVE QUESTIONS ABOUT YOUR PRE-OP INSTRUCTIONS PLEASE CALL [phone] [place]    If you received a COVID test during your pre-op visit  it is requested that you wear a mask when out in public, stay away from anyone that may not be feeling well and notify your surgeon if you develop symptoms. If you test positive for Covid or have been in contact with anyone that has tested positive in the last 10 days please notify you surgeon.    [facility] - Preparing for Surgery Before surgery, you can play an important role.  Because skin is not sterile, your skin needs to be as free of germs as possible.  You can reduce the number of germs on your skin by washing with CHG (chlorahexidine gluconate) soap before surgery.  CHG is an antiseptic cleaner which kills germs and bonds with the skin to continue killing germs even after washing. Please DO NOT use if you have an allergy to CHG or antibacterial soaps.  If your skin becomes reddened/irritated stop using the CHG and inform your nurse when you arrive at Short Stay. Do not shave (including legs and underarms) for at least 48 hours prior to the first CHG shower.  You may shave your face/neck.  Please follow these instructions carefully:  1.  Shower with CHG Soap the night before surgery and the  morning of surgery.  2.  If you choose to wash your hair, wash your hair first as usual with your normal  shampoo.  3.  After you shampoo, rinse your hair and body thoroughly to remove the shampoo.                             4.  Use CHG as you would any other liquid soap.  You can apply chg directly to the skin and wash.  Gently with a scrungie or clean washcloth.  5.  Apply the CHG Soap to your body ONLY FROM THE NECK DOWN.   Do   not  use on face/ open                           Wound or open sores. Avoid contact with eyes, ears mouth and   genitals (private parts).                       Wash face,  Genitals (private parts) with your normal soap.             6.  Wash thoroughly, paying special attention to the area where your    surgery  will be performed.  7.  Thoroughly rinse your body with warm water from the neck  down.  8.  DO NOT shower/wash with your normal soap after using and rinsing off the CHG Soap.                9.  Pat yourself dry with a clean towel.            10.  Wear clean pajamas.            11.  Place clean sheets on your bed the night of your first shower and do not  sleep with pets. Day of Surgery : Do not apply any lotions/deodorants the morning of surgery.  Please wear clean clothes to the hospital/surgery center.  FAILURE TO FOLLOW THESE INSTRUCTIONS MAY RESULT IN THE CANCELLATION OF YOUR SURGERY  PATIENT SIGNATURE_________________________________  NURSE SIGNATURE__________________________________  ________________________________________________________________________

## 2023-03-07 NOTE — Telephone Encounter (Signed)
Contacted patient to scheduled appointments. Patient is aware of appointments that are scheduled.   

## 2023-03-07 NOTE — Progress Notes (Addendum)
Case manager Jasmine present during pre op appointment. She can be reached at [phone]  Called [place], RN at [phone] regarding bowel prep. No answer. Left HIPAA compliant voicemail with callback number. [place], RN called back and confirmed that he or someone else will be available to help patient day of prep.  Health care POA and blood refusal form on chart.  COVID Vaccine Completed: yes  Date of COVID positive in last 90 days:  PCP - Marrianne Mood, MD Cardiologist - n/a Oncology- Malachy Mood, MD  Chest x-ray - 06/23/22 Epic EKG - 06/24/22 Epic Stress Test - n/a ECHO - 06/24/22 Epic Cardiac Cath - n/a Pacemaker/ICD device last checked: n/a Spinal Cord Stimulator: n/a  Bowel Prep - clear liquids day before, Dulcolax, Miralax, Neomycin, Flagyl, carb drinks, pt has instructions with her and reports she will have her sister over the day of prep for help. She reports her RN [place] is also aware  Sleep Study - yes, positive CPAP - no  Fasting Blood Sugar -  Checks Blood Sugar no checks at home  Last dose of GLP1 agonist- Victoza daily, hold 24 hours GLP1 instructions:  hold 24 hours, do not take 03/23/23, last dose 03/22/23   Last dose of SGLT-2 inhibitors-  N/A SGLT-2 instructions: N/A   Blood Thinner Instructions:  n/a Aspirin Instructions: Last Dose:  Activity level: Can go up a flight of stairs and perform activities of daily living without stopping and without symptoms of chest pain. SOB with activity, pt reports new  Anesthesia review: cerebral palsy, heart murmur, HTN, OSA, DM2, Hgb 9.6  Patient denies shortness of breath, fever, cough and chest pain at PAT appointment  Patient verbalized understanding of instructions that were given to them at the PAT appointment. Patient was also instructed that they will need to review over the PAT instructions again at home before surgery.

## 2023-03-08 ENCOUNTER — Ambulatory Visit: Payer: Self-pay | Admitting: Surgery

## 2023-03-08 NOTE — Progress Notes (Signed)
Surgery orders requested via Epic inbox. °

## 2023-03-15 ENCOUNTER — Encounter (HOSPITAL_COMMUNITY)
Admission: RE | Admit: 2023-03-15 | Discharge: 2023-03-15 | Disposition: A | Discharge status: Home / Self Care | Payer: Medicare Other | Source: Ambulatory Visit | Attending: Surgery | Admitting: Surgery

## 2023-03-15 ENCOUNTER — Other Ambulatory Visit: Payer: Self-pay

## 2023-03-15 ENCOUNTER — Encounter (HOSPITAL_COMMUNITY): Payer: Self-pay

## 2023-03-15 VITALS — BP 117/84 | HR 85 | Temp 98.2°F | Resp 16 | Ht 64.0 in | Wt 206.0 lb

## 2023-03-15 DIAGNOSIS — G809 Cerebral palsy, unspecified: Secondary | ICD-10-CM | POA: Insufficient documentation

## 2023-03-15 DIAGNOSIS — D6489 Other specified anemias: Secondary | ICD-10-CM | POA: Diagnosis not present

## 2023-03-15 DIAGNOSIS — Z01818 Encounter for other preprocedural examination: Secondary | ICD-10-CM

## 2023-03-15 DIAGNOSIS — I10 Essential (primary) hypertension: Secondary | ICD-10-CM | POA: Diagnosis not present

## 2023-03-15 DIAGNOSIS — C189 Malignant neoplasm of colon, unspecified: Secondary | ICD-10-CM | POA: Diagnosis not present

## 2023-03-15 DIAGNOSIS — Z01812 Encounter for preprocedural laboratory examination: Secondary | ICD-10-CM | POA: Diagnosis present

## 2023-03-15 DIAGNOSIS — E119 Type 2 diabetes mellitus without complications: Secondary | ICD-10-CM | POA: Diagnosis not present

## 2023-03-15 LAB — BASIC METABOLIC PANEL
Anion gap: 10 (ref 5–15)
BUN: 5 mg/dL — ABNORMAL LOW (ref 6–20)
CO2: 26 mmol/L (ref 22–32)
Calcium: 9.2 mg/dL (ref 8.9–10.3)
Chloride: 102 mmol/L (ref 98–111)
Creatinine, Ser: 0.55 mg/dL (ref 0.44–1.00)
GFR, Estimated: >60 mL/min (ref >60)
Glucose, Bld: 102 mg/dL — ABNORMAL HIGH (ref 70–99)
Potassium: 3.6 mmol/L (ref 3.5–5.1)
Sodium: 138 mmol/L (ref 135–145)

## 2023-03-15 LAB — CBC
HCT: 31.5 % — ABNORMAL LOW (ref 36.0–46.0)
Hemoglobin: 9.6 g/dL — ABNORMAL LOW (ref 12.0–15.0)
MCH: 24.3 pg — ABNORMAL LOW (ref 26.0–34.0)
MCHC: 30.5 g/dL (ref 30.0–36.0)
MCV: 79.7 fL — ABNORMAL LOW (ref 80.0–100.0)
Platelets: 452 10*3/uL — ABNORMAL HIGH (ref 150–400)
RBC: 3.95 MIL/uL (ref 3.87–5.11)
RDW: 19.2 % — ABNORMAL HIGH (ref 11.5–15.5)
WBC: 5.7 10*3/uL (ref 4.0–10.5)
nRBC: 0 % (ref 0.0–0.2)

## 2023-03-15 LAB — GLUCOSE, CAPILLARY: Glucose-Capillary: 99 mg/dL (ref 70–99)

## 2023-03-15 LAB — NO BLOOD PRODUCTS

## 2023-03-15 NOTE — Progress Notes (Signed)
Hgb 9.6 routed to Dr. Michaell Cowing  Addendum: Per dr. Michaell Cowing patient okay to proceed with surgery as part of her anemia is related to colon cancer.

## 2023-03-15 NOTE — Telephone Encounter (Signed)
She is iron replete. She can discontinue oral iron.

## 2023-03-16 ENCOUNTER — Other Ambulatory Visit: Payer: Medicare Other

## 2023-03-16 ENCOUNTER — Ambulatory Visit: Payer: Medicare Other | Admitting: Hematology

## 2023-03-16 NOTE — Progress Notes (Signed)
Case: [id number] Date/Time: 03/24/23 0715   Procedures:      ROBOTIC COLECTOMY PROXIMAL     XI ROBOTIC ASSISTED SALPINGO OOPHORECTOMY (Bilateral)   Anesthesia type: General   Pre-op diagnosis: COLON CANCER   Location: [facility] ROOM 02 / [facility]   Surgeons: Karie Soda, MD; Carver Fila, MD       DISCUSSION: [name] is a 52 year old female who presents to PAT clinic for surgery listed above.  Has had longstanding anemia and then started having bloody stools. Ultimately referred to GI for colonoscopy. Colonoscopy performed in January and was positive for a mass which pathology was positive for cancer.  She is now scheduled for colectomy and bilateral oophorectomy with Dr. Michaell Cowing.  No prior anesthesia complications.  Other past medical history significant for deafness, cerebral palsy, HTN, diabetes, anemia and receiving IV iron infusions and EPO due to refusal of blood products.  Surgery was originally scheduled for 4/3 however it was canceled and rescheduled due to bowel prep not being done and antibiotics not being taken.   VS: BP 117/84   Pulse 85   Temp 36.8 C (Oral)   Resp 16   Ht 5\' 4"  (1.626 m)   Wt 93.4 kg   LMP 07/23/2015 (Exact Date)   SpO2 100%   BMI 35.36 kg/m   PROVIDERS: Marrianne Mood, MD   LABS: Labs reviewed: Acceptable for surgery. (all labs ordered are listed, but only abnormal results are displayed)  Labs Reviewed  BASIC METABOLIC PANEL - Abnormal; Notable for the following components:      Result Value   Glucose, Bld 102 (*)    BUN 5 (*)    All other components within normal limits  CBC - Abnormal; Notable for the following components:   Hemoglobin 9.6 (*)    HCT 31.5 (*)    MCV 79.7 (*)    MCH 24.3 (*)    RDW 19.2 (*)    Platelets 452 (*)    All other components within normal limits  GLUCOSE, CAPILLARY  NO BLOOD PRODUCTS     IMAGES:  CT abdomen w/wo contrast 01/16/23:  IMPRESSION: Stable 1.2 cm benign right adrenal adenoma.  Stable mild nodular hyperplasia of the left adrenal gland. No followup imaging is recommended.   Bilateral nephrolithiasis. No evidence of hydronephrosis.  US pelvis 12/29/22:  IMPRESSION: 1. Left ovarian cyst has diminished in size from prior CT. Additionally patient was scanned 5 days apart and the cyst diminished in size over the course of the exams, initially 6.2 cm greatest dimension, subsequently 3.6 cm greatest dimension. Overall findings suggest a resolving hemorrhagic cyst, however examination is technically challenging. In this setting, recommend a follow-up ultrasound in 6 weeks to ensure stability and or potentially resolution. Alternatively, pelvic MRI characterization could be considered. 2. No pelvic free fluid.  CT chest/abdomen/pelvis 12/21/22:  IMPRESSION: 1. Colon is minimally distended and no discrete colonic lesion is identified to correspond with patient's given history of colonic neoplasm. 2. Bilateral adrenal nodules measuring 12 mm in the right adrenal gland and 1 cm in the left adrenal gland are new from CT August 20 2015. These likely reflect benign adrenal adenomas however they are technically indeterminate and small metastatic lesions are not excluded, consider more definitive characterization with pre and postcontrast adrenal protocol CT or MRI versus attention on follow-up oncologic imaging. 3. Tiny bilateral pulmonary nodules along the major fissures measuring up to 2 mm are nonspecific but favored to reflect intrapulmonary lymph nodes. However, given lack  of prior imaging for comparison these warrant attention on short-term interval follow-up dedicated chest CT. 4. 7.7 cm left ovarian cyst, recommend further evaluation by dedicated pelvic ultrasound. 5. Nonobstructive bilateral renal stones measure up to 4 mm in the right kidney.    EKG 06/24/22  Normal sinus rhythm with sinus arrhythmia   CV 06/23/22:  IMPRESSION: 1. No acute  intrathoracic process.  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 US Neck 11/06/14: Bilateral nodules. Dominant right lower pole nodule measures 2.3 cm. Findings meet consensus criteria for biopsy. Ultrasound-guided fine needle aspiration should be considered Aspiration pathology: benign follicular nodule    Past Surgical History:  Procedure Laterality Date   CESAREAN SECTION     EXPLORATORY LAPAROTOMY WITH ABDOMINAL MASS EXCISION     MYOMECTOMY N/A 08/05/2015   Procedure: Abdominal Hysterectomy, Bilateral Salpingectomy;  Surgeon: Tereso Newcomer, MD;  Location: [facility];  Service: Gynecology;  Laterality: N/A;    MEDICATIONS:  acetaminophen (TYLENOL) 500 MG tablet   amLODipine (NORVASC) 10 MG tablet   atorvastatin (LIPITOR) 40 MG tablet   benzonatate (TESSALON PERLES) 100 MG capsule   carvedilol (COREG) 6.25 MG tablet   diphenhydrAMINE (BENADRYL) 50 MG tablet   DULoxetine (CYMBALTA) 30 MG capsule   ferrous sulfate 325 (65 FE) MG EC tablet   fluticasone (FLONASE) 50 MCG/ACT nasal spray   guaiFENesin (MUCINEX) 600 MG 12 hr tablet   Insulin Pen Needle (BD PEN NEEDLE NANO U/F) 32G X 4 MM MISC   liraglutide (VICTOZA) 18 MG/3ML SOPN   metFORMIN (GLUCOPHAGE) 1000 MG tablet   metroNIDAZOLE (FLAGYL) 500 MG tablet   Multiple Vitamins-Minerals (HAIR SKIN & NAILS) TABS   naproxen (NAPROSYN) 500 MG tablet   neomycin  (MYCIFRADIN) 500 MG tablet   omeprazole (PRILOSEC) 40 MG capsule   Paliperidone ER (INVEGA SUSTENNA) injection   predniSONE (DELTASONE) 50 MG tablet   predniSONE (DELTASONE) 50 MG tablet   No current facility-administered medications for this encounter.   Marcille Blanco [facility] Short Stay/Anesthesiology [facility] Phone [phone] 03/16/2023 11:25 AM

## 2023-03-16 NOTE — Anesthesia Preprocedure Evaluation (Addendum)
Anesthesia Evaluation  Patient identified by MRN, date of birth, ID band Patient awake    Reviewed: Allergy & Precautions, NPO status , Patient's Chart, lab work & pertinent test results  Airway Mallampati: III       Dental  (+) Poor Dentition   Pulmonary sleep apnea    Pulmonary exam normal        Cardiovascular hypertension, Pt. on medications and Pt. on home beta blockers Normal cardiovascular exam     Neuro/Psych  PSYCHIATRIC DISORDERS Anxiety Depression       GI/Hepatic negative GI ROS, Neg liver ROS,,,  Endo/Other  diabetes, Type 2, Oral Hypoglycemic Agents    Renal/GU negative Renal ROS  negative genitourinary   Musculoskeletal negative musculoskeletal ROS (+)    Abdominal  (+) + obese  Peds  Hematology  (+) Blood dyscrasia, anemia   Anesthesia Other Findings     1. Left ventricular ejection fraction, by estimation, is 55%. The left  ventricle has normal function. The left ventricle has no regional wall  motion abnormalities. Left ventricular diastolic parameters are  indeterminate.   2. Right ventricular systolic function is normal. The right ventricular  size is normal. There is normal pulmonary artery systolic pressure. The  estimated right ventricular systolic pressure is 35.6 mmHg.   3. The mitral valve is normal in structure. Mild mitral valve  regurgitation. No evidence of mitral stenosis.   4. The aortic valve is grossly normal. Aortic valve regurgitation is not  visualized. No aortic stenosis is present.   5. The inferior vena cava is dilated in size with <50% respiratory  variability, suggesting right atrial pressure of 15 mmHg.   FINDINGS   Left Vent    Reproductive/Obstetrics                             Anesthesia Physical Anesthesia Plan  ASA: 2  Anesthesia Plan: General   Post-op Pain Management: Dilaudid IV   Induction: Intravenous  PONV Risk Score  and Plan: 4 or greater and Ondansetron, Midazolam and Treatment may vary due to age or medical condition  Airway Management Planned: Oral ETT  Additional Equipment: None  Intra-op Plan:   Post-operative Plan: Extubation in OR  Informed Consent: I have reviewed the patients History and Physical, chart, labs and discussed the procedure including the risks, benefits and alternatives for the proposed anesthesia with the patient or authorized representative who has indicated his/her understanding and acceptance.     Dental advisory given  Plan Discussed with: CRNA  Anesthesia Plan Comments: (See PAT note from 4/30 by Sherlie Ban PA-C)        Anesthesia Quick Evaluation

## 2023-03-22 ENCOUNTER — Telehealth: Payer: Self-pay

## 2023-03-22 DIAGNOSIS — R918 Other nonspecific abnormal finding of lung field: Secondary | ICD-10-CM

## 2023-03-22 NOTE — Telephone Encounter (Addendum)
-----   Message from Missy Sabins, RN sent at 12/22/2022 11:02 AM EST ----- Regarding: CT chest CT chest - lung nodules - need to enter order

## 2023-03-22 NOTE — Telephone Encounter (Addendum)
Attempted to reach patient via phone - the line just rings. MyChart message sent to patient.  CT chest order in epic. Secure staff message sent to radiology scheduling to contact patient to set up appt.

## 2023-03-23 ENCOUNTER — Encounter: Payer: Medicare Other | Admitting: Student

## 2023-03-24 ENCOUNTER — Encounter (HOSPITAL_COMMUNITY)
Admission: RE | Disposition: A | Discharge status: Home / Self Care | Payer: Self-pay | Source: Home / Self Care | Attending: Surgery

## 2023-03-24 ENCOUNTER — Other Ambulatory Visit: Payer: Self-pay

## 2023-03-24 ENCOUNTER — Inpatient Hospital Stay (HOSPITAL_COMMUNITY)
Admission: RE | Admit: 2023-03-24 | Discharge: 2023-03-27 | DRG: 330 | Disposition: A | Discharge status: Home / Self Care | Payer: Medicare Other | Attending: Surgery | Admitting: Surgery

## 2023-03-24 ENCOUNTER — Encounter (HOSPITAL_COMMUNITY): Payer: Self-pay | Admitting: Surgery

## 2023-03-24 ENCOUNTER — Inpatient Hospital Stay (HOSPITAL_COMMUNITY): Payer: Medicare Other | Admitting: Anesthesiology

## 2023-03-24 ENCOUNTER — Inpatient Hospital Stay (HOSPITAL_COMMUNITY): Payer: Medicare Other | Admitting: Physician Assistant

## 2023-03-24 DIAGNOSIS — Z8249 Family history of ischemic heart disease and other diseases of the circulatory system: Secondary | ICD-10-CM | POA: Diagnosis not present

## 2023-03-24 DIAGNOSIS — G809 Cerebral palsy, unspecified: Secondary | ICD-10-CM | POA: Diagnosis present

## 2023-03-24 DIAGNOSIS — Z531 Procedure and treatment not carried out because of patient's decision for reasons of belief and group pressure: Secondary | ICD-10-CM | POA: Diagnosis present

## 2023-03-24 DIAGNOSIS — N8302 Follicular cyst of left ovary: Secondary | ICD-10-CM | POA: Diagnosis present

## 2023-03-24 DIAGNOSIS — R1900 Intra-abdominal and pelvic swelling, mass and lump, unspecified site: Secondary | ICD-10-CM | POA: Diagnosis present

## 2023-03-24 DIAGNOSIS — H903 Sensorineural hearing loss, bilateral: Secondary | ICD-10-CM | POA: Diagnosis present

## 2023-03-24 DIAGNOSIS — Z882 Allergy status to sulfonamides status: Secondary | ICD-10-CM

## 2023-03-24 DIAGNOSIS — I10 Essential (primary) hypertension: Secondary | ICD-10-CM | POA: Diagnosis present

## 2023-03-24 DIAGNOSIS — N809 Endometriosis, unspecified: Secondary | ICD-10-CM | POA: Diagnosis present

## 2023-03-24 DIAGNOSIS — C189 Malignant neoplasm of colon, unspecified: Secondary | ICD-10-CM

## 2023-03-24 DIAGNOSIS — F32A Depression, unspecified: Secondary | ICD-10-CM | POA: Diagnosis present

## 2023-03-24 DIAGNOSIS — F419 Anxiety disorder, unspecified: Secondary | ICD-10-CM | POA: Diagnosis present

## 2023-03-24 DIAGNOSIS — F259 Schizoaffective disorder, unspecified: Secondary | ICD-10-CM | POA: Diagnosis present

## 2023-03-24 DIAGNOSIS — H9193 Unspecified hearing loss, bilateral: Secondary | ICD-10-CM | POA: Diagnosis present

## 2023-03-24 DIAGNOSIS — Z6841 Body Mass Index (BMI) 40.0 and over, adult: Secondary | ICD-10-CM

## 2023-03-24 DIAGNOSIS — N83202 Unspecified ovarian cyst, left side: Secondary | ICD-10-CM

## 2023-03-24 DIAGNOSIS — Z86010 Personal history of colonic polyps: Secondary | ICD-10-CM

## 2023-03-24 DIAGNOSIS — Z801 Family history of malignant neoplasm of trachea, bronchus and lung: Secondary | ICD-10-CM

## 2023-03-24 DIAGNOSIS — N736 Female pelvic peritoneal adhesions (postinfective): Secondary | ICD-10-CM | POA: Diagnosis present

## 2023-03-24 DIAGNOSIS — N83201 Unspecified ovarian cyst, right side: Secondary | ICD-10-CM | POA: Insufficient documentation

## 2023-03-24 DIAGNOSIS — Z90710 Acquired absence of both cervix and uterus: Secondary | ICD-10-CM

## 2023-03-24 DIAGNOSIS — Z79899 Other long term (current) drug therapy: Secondary | ICD-10-CM

## 2023-03-24 DIAGNOSIS — Z833 Family history of diabetes mellitus: Secondary | ICD-10-CM

## 2023-03-24 DIAGNOSIS — D630 Anemia in neoplastic disease: Secondary | ICD-10-CM

## 2023-03-24 DIAGNOSIS — Z800 Family history of malignant neoplasm of digestive organs: Secondary | ICD-10-CM

## 2023-03-24 DIAGNOSIS — Z5982 Transportation insecurity: Secondary | ICD-10-CM

## 2023-03-24 DIAGNOSIS — E785 Hyperlipidemia, unspecified: Secondary | ICD-10-CM | POA: Diagnosis present

## 2023-03-24 DIAGNOSIS — G4733 Obstructive sleep apnea (adult) (pediatric): Secondary | ICD-10-CM | POA: Diagnosis present

## 2023-03-24 DIAGNOSIS — Z888 Allergy status to other drugs, medicaments and biological substances status: Secondary | ICD-10-CM | POA: Diagnosis not present

## 2023-03-24 DIAGNOSIS — Z8742 Personal history of other diseases of the female genital tract: Secondary | ICD-10-CM

## 2023-03-24 DIAGNOSIS — R000 Tachycardia, unspecified: Secondary | ICD-10-CM | POA: Diagnosis not present

## 2023-03-24 DIAGNOSIS — E876 Hypokalemia: Secondary | ICD-10-CM | POA: Diagnosis not present

## 2023-03-24 DIAGNOSIS — Z794 Long term (current) use of insulin: Secondary | ICD-10-CM

## 2023-03-24 DIAGNOSIS — Z7984 Long term (current) use of oral hypoglycemic drugs: Secondary | ICD-10-CM | POA: Diagnosis not present

## 2023-03-24 DIAGNOSIS — H905 Unspecified sensorineural hearing loss: Secondary | ICD-10-CM | POA: Diagnosis present

## 2023-03-24 DIAGNOSIS — Z91030 Bee allergy status: Secondary | ICD-10-CM

## 2023-03-24 DIAGNOSIS — Z881 Allergy status to other antibiotic agents status: Secondary | ICD-10-CM

## 2023-03-24 DIAGNOSIS — G4730 Sleep apnea, unspecified: Secondary | ICD-10-CM

## 2023-03-24 DIAGNOSIS — C183 Malignant neoplasm of hepatic flexure: Secondary | ICD-10-CM | POA: Diagnosis present

## 2023-03-24 DIAGNOSIS — D62 Acute posthemorrhagic anemia: Secondary | ICD-10-CM | POA: Diagnosis not present

## 2023-03-24 DIAGNOSIS — Z803 Family history of malignant neoplasm of breast: Secondary | ICD-10-CM

## 2023-03-24 DIAGNOSIS — N8301 Follicular cyst of right ovary: Secondary | ICD-10-CM | POA: Diagnosis present

## 2023-03-24 DIAGNOSIS — E119 Type 2 diabetes mellitus without complications: Secondary | ICD-10-CM | POA: Diagnosis present

## 2023-03-24 DIAGNOSIS — Z91010 Allergy to peanuts: Secondary | ICD-10-CM

## 2023-03-24 DIAGNOSIS — Z9079 Acquired absence of other genital organ(s): Secondary | ICD-10-CM

## 2023-03-24 DIAGNOSIS — N83311 Acquired atrophy of right ovary: Secondary | ICD-10-CM | POA: Diagnosis present

## 2023-03-24 HISTORY — PX: ROBOTIC ASSISTED SALPINGO OOPHERECTOMY: SHX6082

## 2023-03-24 HISTORY — DX: Personal history of other diseases of the female genital tract: Z87.42

## 2023-03-24 LAB — GLUCOSE, CAPILLARY
Glucose-Capillary: 134 mg/dL — ABNORMAL HIGH (ref 70–99)
Glucose-Capillary: 217 mg/dL — ABNORMAL HIGH (ref 70–99)

## 2023-03-24 SURGERY — COLECTOMY, SIGMOID, ROBOT-ASSISTED
Anesthesia: General | Site: Abdomen

## 2023-03-24 MED ORDER — ESMOLOL HCL 100 MG/10ML IV SOLN
INTRAVENOUS | Status: AC
  Filled 2023-03-24: qty 10

## 2023-03-24 MED ORDER — HYDROMORPHONE HCL 1 MG/ML IJ SOLN
0.2500 mg | INTRAMUSCULAR | Status: DC | PRN
  Administered 2023-03-24 (×2): 0.25 mg via INTRAVENOUS
  Administered 2023-03-24 (×2): 0.5 mg via INTRAVENOUS

## 2023-03-24 MED ORDER — DIPHENHYDRAMINE HCL 50 MG/ML IJ SOLN: 12.5000 mg | Freq: Four times a day (QID) | INTRAMUSCULAR | Status: DC | PRN

## 2023-03-24 MED ORDER — POLYETHYLENE GLYCOL 3350 17 GM/SCOOP PO POWD: 1.0000 | Freq: Once | ORAL | Status: DC

## 2023-03-24 MED ORDER — HAIR SKIN & NAILS PO TABS: 1.0000 | ORAL_TABLET | Freq: Every day | ORAL | Status: DC

## 2023-03-24 MED ORDER — PHENOL 1.4 % MT LIQD: 2.0000 | OROMUCOSAL | Status: DC | PRN

## 2023-03-24 MED ORDER — ONDANSETRON HCL 4 MG/2ML IJ SOLN
INTRAMUSCULAR | Status: DC | PRN
  Administered 2023-03-24: 4 mg via INTRAVENOUS

## 2023-03-24 MED ORDER — ORAL CARE MOUTH RINSE: 15.0000 mL | Freq: Once | OROMUCOSAL | Status: AC

## 2023-03-24 MED ORDER — CALCIUM POLYCARBOPHIL 625 MG PO TABS
625.0000 mg | ORAL_TABLET | Freq: Two times a day (BID) | ORAL | Status: DC
  Administered 2023-03-24 – 2023-03-27 (×6): 625 mg via ORAL
  Filled 2023-03-24 (×6): qty 1

## 2023-03-24 MED ORDER — 0.9 % SODIUM CHLORIDE (POUR BTL) OPTIME
TOPICAL | Status: DC | PRN
  Administered 2023-03-24: 2000 mL

## 2023-03-24 MED ORDER — SODIUM CHLORIDE 0.9 % IV SOLN
2.0000 g | Freq: Two times a day (BID) | INTRAVENOUS | Status: AC
  Administered 2023-03-24: 2 g via INTRAVENOUS
  Filled 2023-03-24: qty 2

## 2023-03-24 MED ORDER — ALVIMOPAN 12 MG PO CAPS
12.0000 mg | ORAL_CAPSULE | Freq: Two times a day (BID) | ORAL | Status: DC
  Administered 2023-03-25: 12 mg via ORAL
  Filled 2023-03-24: qty 1

## 2023-03-24 MED ORDER — SODIUM CHLORIDE 0.9 % IV SOLN
2.0000 g | INTRAVENOUS | Status: AC
  Administered 2023-03-24: 2 g via INTRAVENOUS
  Filled 2023-03-24: qty 2

## 2023-03-24 MED ORDER — FLUTICASONE PROPIONATE 50 MCG/ACT NA SUSP: 2.0000 | Freq: Every day | NASAL | Status: DC | PRN

## 2023-03-24 MED ORDER — LACTATED RINGERS IV BOLUS: 1000.0000 mL | Freq: Three times a day (TID) | INTRAVENOUS | Status: DC | PRN

## 2023-03-24 MED ORDER — BUPIVACAINE-EPINEPHRINE (PF) 0.25% -1:200000 IJ SOLN: INTRAMUSCULAR | Status: DC | PRN

## 2023-03-24 MED ORDER — HYDROMORPHONE HCL 1 MG/ML IJ SOLN
0.5000 mg | INTRAMUSCULAR | Status: DC | PRN
  Administered 2023-03-24: 1 mg via INTRAVENOUS
  Filled 2023-03-24: qty 1

## 2023-03-24 MED ORDER — ACETAMINOPHEN 10 MG/ML IV SOLN: 1000.0000 mg | Freq: Once | INTRAVENOUS | Status: DC | PRN

## 2023-03-24 MED ORDER — BENZONATATE 100 MG PO CAPS: 100.0000 mg | ORAL_CAPSULE | Freq: Three times a day (TID) | ORAL | Status: DC | PRN

## 2023-03-24 MED ORDER — LACTATED RINGERS IV SOLN: INTRAVENOUS | Status: DC

## 2023-03-24 MED ORDER — METHOCARBAMOL 1000 MG/10ML IJ SOLN: 1000.0000 mg | Freq: Four times a day (QID) | INTRAVENOUS | Status: DC | PRN

## 2023-03-24 MED ORDER — ACETAMINOPHEN 500 MG PO TABS
1000.0000 mg | ORAL_TABLET | ORAL | Status: AC
  Administered 2023-03-24: 1000 mg via ORAL
  Filled 2023-03-24: qty 2

## 2023-03-24 MED ORDER — NEOMYCIN SULFATE 500 MG PO TABS: 1000.0000 mg | ORAL_TABLET | ORAL | Status: DC

## 2023-03-24 MED ORDER — AMLODIPINE BESYLATE 10 MG PO TABS
10.0000 mg | ORAL_TABLET | Freq: Every day | ORAL | Status: DC
  Administered 2023-03-25 – 2023-03-27 (×3): 10 mg via ORAL
  Filled 2023-03-24 (×3): qty 1

## 2023-03-24 MED ORDER — LACTATED RINGERS IV SOLN: INTRAVENOUS | Status: DC | PRN

## 2023-03-24 MED ORDER — BUPIVACAINE HCL 0.25 % IJ SOLN
INTRAMUSCULAR | Status: AC
  Filled 2023-03-24: qty 1

## 2023-03-24 MED ORDER — LIP MEDEX EX OINT
TOPICAL_OINTMENT | Freq: Two times a day (BID) | CUTANEOUS | Status: DC
  Administered 2023-03-24: 75 via TOPICAL
  Filled 2023-03-24 (×2): qty 7

## 2023-03-24 MED ORDER — LIRAGLUTIDE 18 MG/3ML ~~LOC~~ SOPN: 1.8000 mg | PEN_INJECTOR | Freq: Every day | SUBCUTANEOUS | Status: DC

## 2023-03-24 MED ORDER — ENSURE SURGERY PO LIQD: 237.0000 mL | Freq: Two times a day (BID) | ORAL | Status: DC

## 2023-03-24 MED ORDER — PROPOFOL 10 MG/ML IV BOLUS
INTRAVENOUS | Status: DC | PRN
  Administered 2023-03-24: 200 mg via INTRAVENOUS

## 2023-03-24 MED ORDER — METHOCARBAMOL 500 MG PO TABS
1000.0000 mg | ORAL_TABLET | Freq: Four times a day (QID) | ORAL | Status: DC | PRN
  Administered 2023-03-24: 1000 mg via ORAL
  Filled 2023-03-24: qty 2

## 2023-03-24 MED ORDER — HALOPERIDOL LACTATE 5 MG/ML IJ SOLN: 2.0000 mg | Freq: Four times a day (QID) | INTRAMUSCULAR | Status: DC | PRN

## 2023-03-24 MED ORDER — METOPROLOL TARTRATE 5 MG/5ML IV SOLN
INTRAVENOUS | Status: AC
  Filled 2023-03-24: qty 5

## 2023-03-24 MED ORDER — DIPHENHYDRAMINE HCL 12.5 MG/5ML PO ELIX: 12.5000 mg | ORAL_SOLUTION | Freq: Four times a day (QID) | ORAL | Status: DC | PRN

## 2023-03-24 MED ORDER — BUPIVACAINE LIPOSOME 1.3 % IJ SUSP
INTRAMUSCULAR | Status: DC | PRN
  Administered 2023-03-24: 20 mL

## 2023-03-24 MED ORDER — ENOXAPARIN SODIUM 40 MG/0.4ML IJ SOSY: 40.0000 mg | PREFILLED_SYRINGE | INTRAMUSCULAR | Status: DC

## 2023-03-24 MED ORDER — ATORVASTATIN CALCIUM 20 MG PO TABS
40.0000 mg | ORAL_TABLET | Freq: Every day | ORAL | Status: DC
  Administered 2023-03-25 – 2023-03-27 (×3): 40 mg via ORAL
  Filled 2023-03-24 (×3): qty 2

## 2023-03-24 MED ORDER — TRAMADOL HCL 50 MG PO TABS
50.0000 mg | ORAL_TABLET | Freq: Four times a day (QID) | ORAL | Status: DC | PRN
  Administered 2023-03-24 – 2023-03-25 (×2): 100 mg via ORAL
  Filled 2023-03-24 (×2): qty 2

## 2023-03-24 MED ORDER — ALVIMOPAN 12 MG PO CAPS
12.0000 mg | ORAL_CAPSULE | ORAL | Status: AC
  Administered 2023-03-24: 12 mg via ORAL
  Filled 2023-03-24: qty 1

## 2023-03-24 MED ORDER — ONDANSETRON HCL 4 MG PO TABS: 4.0000 mg | ORAL_TABLET | Freq: Four times a day (QID) | ORAL | Status: DC | PRN

## 2023-03-24 MED ORDER — METOCLOPRAMIDE HCL 5 MG/ML IJ SOLN: 5.0000 mg | Freq: Three times a day (TID) | INTRAMUSCULAR | Status: DC | PRN

## 2023-03-24 MED ORDER — PROMETHAZINE HCL 25 MG/ML IJ SOLN: 6.2500 mg | INTRAMUSCULAR | Status: DC | PRN

## 2023-03-24 MED ORDER — MENTHOL 3 MG MT LOZG: 1.0000 | LOZENGE | OROMUCOSAL | Status: DC | PRN

## 2023-03-24 MED ORDER — MIDAZOLAM HCL 5 MG/5ML IJ SOLN
INTRAMUSCULAR | Status: DC | PRN
  Administered 2023-03-24: 2 mg via INTRAVENOUS

## 2023-03-24 MED ORDER — DEXAMETHASONE SODIUM PHOSPHATE 4 MG/ML IJ SOLN
INTRAMUSCULAR | Status: DC | PRN
  Administered 2023-03-24: 8 mg via INTRAVENOUS

## 2023-03-24 MED ORDER — ENSURE PRE-SURGERY PO LIQD: 296.0000 mL | Freq: Once | ORAL | Status: DC

## 2023-03-24 MED ORDER — FENTANYL CITRATE (PF) 100 MCG/2ML IJ SOLN
INTRAMUSCULAR | Status: DC | PRN
  Administered 2023-03-24: 100 ug via INTRAVENOUS
  Administered 2023-03-24 (×2): 50 ug via INTRAVENOUS

## 2023-03-24 MED ORDER — SIMETHICONE 80 MG PO CHEW: 40.0000 mg | CHEWABLE_TABLET | Freq: Four times a day (QID) | ORAL | Status: DC | PRN

## 2023-03-24 MED ORDER — HYDRALAZINE HCL 20 MG/ML IJ SOLN: 10.0000 mg | INTRAMUSCULAR | Status: DC | PRN

## 2023-03-24 MED ORDER — ENOXAPARIN SODIUM 40 MG/0.4ML IJ SOSY
40.0000 mg | PREFILLED_SYRINGE | Freq: Once | INTRAMUSCULAR | Status: AC
  Administered 2023-03-24: 40 mg via SUBCUTANEOUS
  Filled 2023-03-24: qty 0.4

## 2023-03-24 MED ORDER — ONDANSETRON HCL 4 MG/2ML IJ SOLN: 4.0000 mg | Freq: Four times a day (QID) | INTRAMUSCULAR | Status: DC | PRN

## 2023-03-24 MED ORDER — ROCURONIUM BROMIDE 10 MG/ML (PF) SYRINGE
PREFILLED_SYRINGE | INTRAVENOUS | Status: AC
  Filled 2023-03-24: qty 10

## 2023-03-24 MED ORDER — BUPIVACAINE HCL 0.25 % IJ SOLN
INTRAMUSCULAR | Status: DC | PRN
  Administered 2023-03-24: 50 mL

## 2023-03-24 MED ORDER — METRONIDAZOLE 500 MG PO TABS: 1000.0000 mg | ORAL_TABLET | ORAL | Status: DC

## 2023-03-24 MED ORDER — DULOXETINE HCL 30 MG PO CPEP
30.0000 mg | ORAL_CAPSULE | Freq: Every day | ORAL | Status: DC
  Administered 2023-03-25 – 2023-03-27 (×3): 30 mg via ORAL
  Filled 2023-03-24 (×3): qty 1

## 2023-03-24 MED ORDER — MIDAZOLAM HCL 2 MG/2ML IJ SOLN
INTRAMUSCULAR | Status: AC
  Filled 2023-03-24: qty 2

## 2023-03-24 MED ORDER — LACTATED RINGERS IR SOLN
Status: DC | PRN
  Administered 2023-03-24: 1000 mL

## 2023-03-24 MED ORDER — LIDOCAINE HCL (CARDIAC) PF 100 MG/5ML IV SOSY
PREFILLED_SYRINGE | INTRAVENOUS | Status: DC | PRN
  Administered 2023-03-24: 80 mg via INTRAVENOUS

## 2023-03-24 MED ORDER — ROCURONIUM BROMIDE 100 MG/10ML IV SOLN
INTRAVENOUS | Status: DC | PRN
  Administered 2023-03-24: 70 mg via INTRAVENOUS
  Administered 2023-03-24 (×2): 30 mg via INTRAVENOUS

## 2023-03-24 MED ORDER — MEPERIDINE HCL 50 MG/ML IJ SOLN: 6.2500 mg | INTRAMUSCULAR | Status: DC | PRN

## 2023-03-24 MED ORDER — ALUM & MAG HYDROXIDE-SIMETH 200-200-20 MG/5ML PO SUSP: 30.0000 mL | Freq: Four times a day (QID) | ORAL | Status: DC | PRN

## 2023-03-24 MED ORDER — BISACODYL 5 MG PO TBEC: 20.0000 mg | DELAYED_RELEASE_TABLET | Freq: Once | ORAL | Status: DC

## 2023-03-24 MED ORDER — GABAPENTIN 300 MG PO CAPS
300.0000 mg | ORAL_CAPSULE | ORAL | Status: AC
  Administered 2023-03-24: 300 mg via ORAL
  Filled 2023-03-24: qty 1

## 2023-03-24 MED ORDER — CHLORHEXIDINE GLUCONATE 0.12 % MT SOLN
15.0000 mL | Freq: Once | OROMUCOSAL | Status: AC
  Administered 2023-03-24: 15 mL via OROMUCOSAL

## 2023-03-24 MED ORDER — HYDROMORPHONE HCL 1 MG/ML IJ SOLN
INTRAMUSCULAR | Status: AC
  Filled 2023-03-24: qty 1

## 2023-03-24 MED ORDER — FENTANYL CITRATE (PF) 250 MCG/5ML IJ SOLN
INTRAMUSCULAR | Status: AC
  Filled 2023-03-24: qty 5

## 2023-03-24 MED ORDER — ENSURE PRE-SURGERY PO LIQD: 592.0000 mL | Freq: Once | ORAL | Status: DC

## 2023-03-24 MED ORDER — PROPOFOL 10 MG/ML IV BOLUS
INTRAVENOUS | Status: AC
  Filled 2023-03-24: qty 20

## 2023-03-24 MED ORDER — SUGAMMADEX SODIUM 500 MG/5ML IV SOLN
INTRAVENOUS | Status: DC | PRN
  Administered 2023-03-24: 400 mg via INTRAVENOUS

## 2023-03-24 MED ORDER — GUAIFENESIN ER 600 MG PO TB12: 600.0000 mg | ORAL_TABLET | Freq: Two times a day (BID) | ORAL | Status: DC

## 2023-03-24 MED ORDER — MAGIC MOUTHWASH: 15.0000 mL | Freq: Four times a day (QID) | ORAL | Status: DC | PRN

## 2023-03-24 MED ORDER — MELATONIN 3 MG PO TABS
3.0000 mg | ORAL_TABLET | Freq: Every evening | ORAL | Status: DC | PRN
  Administered 2023-03-26: 3 mg via ORAL
  Filled 2023-03-24: qty 1

## 2023-03-24 MED ORDER — ESMOLOL HCL 100 MG/10ML IV SOLN
INTRAVENOUS | Status: DC | PRN
  Administered 2023-03-24 (×2): 30 mg via INTRAVENOUS

## 2023-03-24 MED ORDER — METOPROLOL TARTRATE 5 MG/5ML IV SOLN: 5.0000 mg | Freq: Four times a day (QID) | INTRAVENOUS | Status: DC | PRN

## 2023-03-24 MED ORDER — BUPIVACAINE LIPOSOME 1.3 % IJ SUSP
INTRAMUSCULAR | Status: AC
  Filled 2023-03-24: qty 20

## 2023-03-24 MED ORDER — ACETAMINOPHEN 500 MG PO TABS
1000.0000 mg | ORAL_TABLET | Freq: Four times a day (QID) | ORAL | Status: DC
  Administered 2023-03-24 – 2023-03-27 (×7): 1000 mg via ORAL
  Filled 2023-03-24 (×7): qty 2

## 2023-03-24 MED ORDER — BUPIVACAINE LIPOSOME 1.3 % IJ SUSP: 20.0000 mL | Freq: Once | INTRAMUSCULAR | Status: DC

## 2023-03-24 SURGICAL SUPPLY — 164 items
ADH SKN CLS APL DERMABOND .7 (GAUZE/BANDAGES/DRESSINGS)
AGENT HMST KT MTR STRL THRMB (HEMOSTASIS)
APL ESCP 34 STRL LF DISP (HEMOSTASIS)
APL PRP STRL LF DISP 70% ISPRP (MISCELLANEOUS) ×2
APPLICATOR SURGIFLO ENDO (HEMOSTASIS) IMPLANT
APPLIER CLIP 5 13 M/L LIGAMAX5 (MISCELLANEOUS)
APPLIER CLIP ROT 10 11.4 M/L (STAPLE)
APR CLP MED LRG 11.4X10 (STAPLE)
APR CLP MED LRG 5 ANG JAW (MISCELLANEOUS)
BAG COUNTER SPONGE SURGICOUNT (BAG) ×2 IMPLANT
BAG LAPAROSCOPIC 12 15 PORT 16 (BASKET) IMPLANT
BAG RETRIEVAL 12/15 (BASKET) ×2
BAG SPNG CNTER NS LX DISP (BAG) ×2
BLADE EXTENDED COATED 6.5IN (ELECTRODE) IMPLANT
BLADE SURG SZ10 CARB STEEL (BLADE) IMPLANT
CANNULA REDUCER 12-8 DVNC XI (CANNULA) IMPLANT
CELLS DAT CNTRL 66122 CELL SVR (MISCELLANEOUS) ×2 IMPLANT
CHLORAPREP W/TINT 26 (MISCELLANEOUS) IMPLANT
CLIP APPLIE 5 13 M/L LIGAMAX5 (MISCELLANEOUS) IMPLANT
CLIP APPLIE ROT 10 11.4 M/L (STAPLE) IMPLANT
COVER BACK TABLE 60X90IN (DRAPES) ×2 IMPLANT
COVER SURGICAL LIGHT HANDLE (MISCELLANEOUS) ×4 IMPLANT
COVER TIP SHEARS 8 DVNC (MISCELLANEOUS) ×4 IMPLANT
DEFOGGER SCOPE WARMER CLEARIFY (MISCELLANEOUS) IMPLANT
DERMABOND ADVANCED .7 DNX12 (GAUZE/BANDAGES/DRESSINGS) ×2 IMPLANT
DEVICE TROCAR PUNCTURE CLOSURE (ENDOMECHANICALS) IMPLANT
DRAIN CHANNEL 19F RND (DRAIN) IMPLANT
DRAPE ARM DVNC X/XI (DISPOSABLE) ×16 IMPLANT
DRAPE COLUMN DVNC XI (DISPOSABLE) ×4 IMPLANT
DRAPE SHEET LG 3/4 BI-LAMINATE (DRAPES) ×2 IMPLANT
DRAPE SURG IRRIG POUCH 19X23 (DRAPES) ×4 IMPLANT
DRIVER NDL LRG 8 DVNC XI (INSTRUMENTS) ×2 IMPLANT
DRIVER NDL MEGA 8 DVNC XI (INSTRUMENTS) ×2 IMPLANT
DRIVER NDLE LRG 8 DVNC XI (INSTRUMENTS) ×2 IMPLANT
DRIVER NDLE MEGA DVNC XI (INSTRUMENTS) ×2 IMPLANT
DRSG OPSITE POSTOP 4X10 (GAUZE/BANDAGES/DRESSINGS) IMPLANT
DRSG OPSITE POSTOP 4X6 (GAUZE/BANDAGES/DRESSINGS) IMPLANT
DRSG OPSITE POSTOP 4X8 (GAUZE/BANDAGES/DRESSINGS) IMPLANT
DRSG TEGADERM 2-3/8X2-3/4 SM (GAUZE/BANDAGES/DRESSINGS) ×10 IMPLANT
DRSG TEGADERM 4X4.75 (GAUZE/BANDAGES/DRESSINGS) IMPLANT
ELECT PENCIL ROCKER SW 15FT (MISCELLANEOUS) ×2 IMPLANT
ELECT REM PT RETURN 15FT ADLT (MISCELLANEOUS) ×4 IMPLANT
ENDOLOOP SUT PDS II  0 18 (SUTURE)
ENDOLOOP SUT PDS II 0 18 (SUTURE) IMPLANT
EVACUATOR SILICONE 100CC (DRAIN) IMPLANT
FORCEPS BPLR FENES DVNC XI (FORCEP) ×3 IMPLANT
FORCEPS PROGRASP DVNC XI (FORCEP) ×2 IMPLANT
GAUZE 4X4 16PLY ~~LOC~~+RFID DBL (SPONGE) ×4 IMPLANT
GAUZE SPONGE 2X2 8PLY STRL LF (GAUZE/BANDAGES/DRESSINGS) ×2 IMPLANT
GLOVE BIO SURGEON STRL SZ 6 (GLOVE) ×8 IMPLANT
GLOVE BIO SURGEON STRL SZ 6.5 (GLOVE) ×3 IMPLANT
GLOVE ECLIPSE 8.0 STRL XLNG CF (GLOVE) ×6 IMPLANT
GLOVE INDICATOR 8.0 STRL GRN (GLOVE) ×6 IMPLANT
GOWN SRG XL LVL 4 BRTHBL STRL (GOWNS) ×2 IMPLANT
GOWN STRL NON-REIN XL LVL4 (GOWNS) ×2
GOWN STRL REUS W/ TWL LRG LVL3 (GOWN DISPOSABLE) ×12 IMPLANT
GOWN STRL REUS W/ TWL XL LVL3 (GOWN DISPOSABLE) ×8 IMPLANT
GOWN STRL REUS W/TWL LRG LVL3 (GOWN DISPOSABLE) ×8
GOWN STRL REUS W/TWL XL LVL3 (GOWN DISPOSABLE) ×8
GRASPER SUT TROCAR 14GX15 (MISCELLANEOUS) IMPLANT
GRASPER TIP-UP FEN DVNC XI (INSTRUMENTS) ×2 IMPLANT
HOLDER FOLEY CATH W/STRAP (MISCELLANEOUS) ×3 IMPLANT
IRRIG SUCT STRYKERFLOW 2 WTIP (MISCELLANEOUS) ×4
IRRIGATION SUCT STRKRFLW 2 WTP (MISCELLANEOUS) ×4 IMPLANT
KIT PROCEDURE DVNC SI (MISCELLANEOUS) IMPLANT
KIT SIGMOIDOSCOPE (SET/KITS/TRAYS/PACK) IMPLANT
KIT TURNOVER KIT A (KITS) IMPLANT
LIGASURE IMPACT 36 18CM CVD LR (INSTRUMENTS) IMPLANT
MANIPULATOR ADVINCU DEL 3.0 PL (MISCELLANEOUS) IMPLANT
MANIPULATOR ADVINCU DEL 3.5 PL (MISCELLANEOUS) IMPLANT
MANIPULATOR UTERINE 4.5 ZUMI (MISCELLANEOUS) IMPLANT
NDL HYPO 21X1.5 SAFETY (NEEDLE) ×2 IMPLANT
NDL INSUFFLATION 14GA 120MM (NEEDLE) ×2 IMPLANT
NDL SPNL 18GX3.5 QUINCKE PK (NEEDLE) IMPLANT
NEEDLE HYPO 21X1.5 SAFETY (NEEDLE) ×2 IMPLANT
NEEDLE INSUFFLATION 14GA 120MM (NEEDLE) ×2 IMPLANT
NEEDLE SPNL 18GX3.5 QUINCKE PK (NEEDLE) IMPLANT
OBTURATOR OPTICAL STND 8 DVNC (TROCAR)
OBTURATOR OPTICALSTD 8 DVNC (TROCAR) ×2 IMPLANT
PACK CARDIOVASCULAR III (CUSTOM PROCEDURE TRAY) ×2 IMPLANT
PACK COLON (CUSTOM PROCEDURE TRAY) ×2 IMPLANT
PACK ROBOT GYN CUSTOM WL (TRAY / TRAY PROCEDURE) ×2 IMPLANT
PAD POSITIONING PINK XL (MISCELLANEOUS) ×4 IMPLANT
PORT ACCESS TROCAR AIRSEAL 12 (TROCAR) IMPLANT
PROTECTOR NERVE ULNAR (MISCELLANEOUS) ×4 IMPLANT
RELOAD STAPLE 45 3.5 BLU DVNC (STAPLE) IMPLANT
RELOAD STAPLE 45 4.3 GRN DVNC (STAPLE) IMPLANT
RELOAD STAPLE 60 2.5 WHT DVNC (STAPLE) IMPLANT
RELOAD STAPLE 60 3.5 BLU DVNC (STAPLE) IMPLANT
RELOAD STAPLE 60 4.3 GRN DVNC (STAPLE) IMPLANT
RELOAD STAPLER 2.5X60 WHT DVNC (STAPLE) ×2 IMPLANT
RELOAD STAPLER 3.5X45 BLU DVNC (STAPLE) IMPLANT
RELOAD STAPLER 3.5X60 BLU DVNC (STAPLE) ×6 IMPLANT
RELOAD STAPLER 4.3X45 GRN DVNC (STAPLE) IMPLANT
RELOAD STAPLER 4.3X60 GRN DVNC (STAPLE) IMPLANT
RETRACTOR WND ALEXIS 18 MED (MISCELLANEOUS) IMPLANT
RTRCTR WOUND ALEXIS 18CM MED (MISCELLANEOUS) ×2
SCISSORS LAP 5X35 DISP (ENDOMECHANICALS) ×3 IMPLANT
SCISSORS MNPLR CVD DVNC XI (INSTRUMENTS) ×4 IMPLANT
SCRUB CHG 4% DYNA-HEX 4OZ (MISCELLANEOUS) IMPLANT
SEAL UNIV 5-12 XI (MISCELLANEOUS) ×16 IMPLANT
SEALER VESSEL EXT DVNC XI (MISCELLANEOUS) ×2 IMPLANT
SET TRI-LUMEN FLTR TB AIRSEAL (TUBING) ×2 IMPLANT
SOL ELECTROSURG ANTI STICK (MISCELLANEOUS) ×2
SOLUTION ELECTROSURG ANTI STCK (MISCELLANEOUS) ×2 IMPLANT
SPIKE FLUID TRANSFER (MISCELLANEOUS) ×4 IMPLANT
SPONGE T-LAP 18X18 ~~LOC~~+RFID (SPONGE) IMPLANT
STAPLER 45 SUREFORM DVNC (STAPLE) IMPLANT
STAPLER 60 SUREFORM DVNC (STAPLE) IMPLANT
STAPLER ECHELON POWER CIR 29 (STAPLE) IMPLANT
STAPLER ECHELON POWER CIR 31 (STAPLE) IMPLANT
STAPLER RELOAD 2.5X60 WHT DVNC (STAPLE) ×2
STAPLER RELOAD 3.5X45 BLU DVNC (STAPLE)
STAPLER RELOAD 3.5X60 BLU DVNC (STAPLE) ×6
STAPLER RELOAD 4.3X45 GRN DVNC (STAPLE)
STAPLER RELOAD 4.3X60 GRN DVNC (STAPLE)
STOPCOCK 4 WAY LG BORE MALE ST (IV SETS) ×4 IMPLANT
SURGIFLO W/THROMBIN 8M KIT (HEMOSTASIS) IMPLANT
SURGILUBE 2OZ TUBE FLIPTOP (MISCELLANEOUS) IMPLANT
SUT MNCRL AB 4-0 PS2 18 (SUTURE) ×2 IMPLANT
SUT PDS AB 1 CT1 27 (SUTURE) ×4 IMPLANT
SUT PDS AB 1 TP1 96 (SUTURE) IMPLANT
SUT PROLENE 0 CT 2 (SUTURE) IMPLANT
SUT PROLENE 2 0 KS (SUTURE) IMPLANT
SUT PROLENE 2 0 SH DA (SUTURE) IMPLANT
SUT SILK 2 0 (SUTURE)
SUT SILK 2 0 SH CR/8 (SUTURE) IMPLANT
SUT SILK 2-0 18XBRD TIE 12 (SUTURE) IMPLANT
SUT SILK 3 0 (SUTURE)
SUT SILK 3 0 SH CR/8 (SUTURE) ×2 IMPLANT
SUT SILK 3-0 18XBRD TIE 12 (SUTURE) IMPLANT
SUT V-LOC 180 0-0 GS22 (SUTURE) IMPLANT
SUT V-LOC BARB 180 2/0GR6 GS22 (SUTURE)
SUT VIC AB 0 CT1 27 (SUTURE)
SUT VIC AB 0 CT1 27XBRD ANTBC (SUTURE) IMPLANT
SUT VIC AB 2-0 CT1 27 (SUTURE)
SUT VIC AB 2-0 CT1 TAPERPNT 27 (SUTURE) IMPLANT
SUT VIC AB 3-0 SH 18 (SUTURE) IMPLANT
SUT VIC AB 3-0 SH 27 (SUTURE)
SUT VIC AB 3-0 SH 27XBRD (SUTURE) IMPLANT
SUT VIC AB 4-0 PS2 18 (SUTURE) ×4 IMPLANT
SUT VICRYL 0 27 CT2 27 ABS (SUTURE) ×2 IMPLANT
SUT VICRYL 0 UR6 27IN ABS (SUTURE) ×2 IMPLANT
SUT VLOC 180 0 9IN  GS21 (SUTURE)
SUT VLOC 180 0 9IN GS21 (SUTURE) IMPLANT
SUTURE V-LC BRB 180 2/0GR6GS22 (SUTURE) IMPLANT
SYR 10ML LL (SYRINGE) IMPLANT
SYR 20ML ECCENTRIC (SYRINGE) ×3 IMPLANT
SYS BAG RETRIEVAL 10MM (BASKET)
SYS LAPSCP GELPORT 120MM (MISCELLANEOUS)
SYS WOUND ALEXIS 18CM MED (MISCELLANEOUS)
SYSTEM BAG RETRIEVAL 10MM (BASKET) IMPLANT
SYSTEM LAPSCP GELPORT 120MM (MISCELLANEOUS) IMPLANT
SYSTEM WOUND ALEXIS 18CM MED (MISCELLANEOUS) ×3 IMPLANT
TOWEL OR NON WOVEN STRL DISP B (DISPOSABLE) ×2 IMPLANT
TRAP SPECIMEN MUCUS 40CC (MISCELLANEOUS) IMPLANT
TRAY FOLEY MTR SLVR 16FR STAT (SET/KITS/TRAYS/PACK) ×4 IMPLANT
TROCAR ADV FIXATION 5X100MM (TROCAR) ×2 IMPLANT
TROCAR PORT AIRSEAL 5X120 (TROCAR) IMPLANT
TUBING CONNECTING 10 (TUBING) ×4 IMPLANT
TUBING INSUFFLATION 10FT LAP (TUBING) ×3 IMPLANT
UNDERPAD 30X36 HEAVY ABSORB (UNDERPADS AND DIAPERS) ×4 IMPLANT
WATER STERILE IRR 1000ML POUR (IV SOLUTION) ×3 IMPLANT
YANKAUER SUCT BULB TIP 10FT TU (MISCELLANEOUS) IMPLANT

## 2023-03-24 NOTE — Op Note (Signed)
03/24/2023  11:26 AM  PATIENT:  [name]  52 y.o. female  Patient Care Team: Marrianne Mood, MD as PCP - General Hanley Seamen, Dustin Folks, MD as Referring Physician (Optometry) Malachy Mood, MD as Consulting Physician (Oncology) Karie Soda, MD as Consulting Physician (General Surgery) Armbruster, Willaim Rayas, MD as Consulting Physician (Gastroenterology) Carver Fila, MD as Consulting Physician (Gynecologic Oncology)  PRE-OPERATIVE DIAGNOSIS:  CANCER OF HEPATIC FLEXURE OF COLON  POST-OPERATIVE DIAGNOSIS:  CANCER OF HEPATIC FLEXURE OF COLON  PROCEDURE:   ROBOTIC PROXIMAL RIGHT COLECTOMY WITH INTRACORPOREAL ANASTOMOSIS TRANSVERSUS ABDOMINIS PLANE (TAP) BLOCK - BILATERAL  SURGEON:  Ardeth Sportsman, MD  ASSISTANT:  Angelena Form, MD  An experienced assistant was required given the standard of surgical care given the complexity of the case.  This assistant was needed for exposure, dissection, suction, tissue approximation, retraction, perception, etc  ANESTHESIA:  General endotracheal intubation anesthesia (GETA) and Regional TRANSVERSUS ABDOMINIS PLANE (TAP) nerve block -BILATERAL for perioperative & postoperative pain control at the level of the transverse abdominis & preperitoneal spaces along the flank at the anterior axillary line, from subcostal ridge to iliac crest under laparoscopic guidance provided with liposomal bupivacaine (Experel) 20mL mixed with 50 mL of bupivicaine 0.25%  Estimated Blood Loss (EBL):   Total I/O In: 1600 [I.V.:1500; IV Piggyback:100] Out: 500 [Urine:450; Blood:50].   (See anesthesia record)  Delay start of Pharmacological VTE agent (>24hrs) due to concerns of significant anemia, surgical blood loss, or risk of bleeding?:  no  DRAINS: (None)  SPECIMEN: Colon - proximal right Ovaries - bilteral  DISPOSITION OF SPECIMEN:  Pathology  COUNTS:  Sponge, needle, & instrument counts CORRECT  PLAN OF CARE: Admit to inpatient   PATIENT DISPOSITION:   PACU - hemodynamically stable.  INDICATION:    Pleasant woman with anemia and rectal bleeding.  Endoscopy done with removal of numerous polyps.  Larger bulkier mass in proximal transverse colon noted.  Biopsy consistent with adenocarcinoma.  I recommended segmental resection:  The anatomy & physiology of the digestive tract was discussed.  The pathophysiology was discussed.  Natural history risks without surgery was discussed.   I worked to give an overview of the disease and the frequent need to have multispecialty involvement.  I feel the risks of no intervention will lead to serious problems that outweigh the operative risks; therefore, I recommended a partial colectomy to remove the pathology.  Laparoscopic & open techniques were discussed.   Risks such as bleeding, infection, abscess, leak, reoperation, possible ostomy, hernia, heart attack, death, and other risks were discussed.  I noted a good likelihood this will help address the problem.   Goals of post-operative recovery were discussed as well.  We will work to minimize complications.  An educational handout on the pathology was given as well.  Questions were answered.  The patient and her family expressed understanding and wish to proceed with surgery.  Initial surgery had to be postponed because patient did not do bowel prep.  We worked to make sure that the patient family and her psychiatric team were educated.  She was successful do her bowel prep so we will proceed today.  Of note, patient does have left greater than right ovarian adnexal masses.  Hard to say if benign but given persistence and the left being greater than 5 cm, gynecological oncology consult made.  Dr. Pricilla Holm felt reasonable to go ahead and proceed with bilateral oophorectomy since the patient is already had hysterectomy and salpingectomies in the past.  Please  see her separate note.  OR FINDINGS:   Patient had tattooing at hepatic flexure consistent with suspected mass  seen by colonoscopy and CT scan of adenocarcinoma.  No obvious metastatic disease on visceral parietal peritoneum or liver.  It is an  isoperistaltic ileocolonic (distal ileum to mid transverse colon ) anastomosis that rests in the periumbilical region.  Patient with mildly enlarged atrophic right ovary excised.  Patient with more bulky complex cystic mass involving the left ovary.  Bilateral oophorectomy done by Dr. Pricilla Holm at beginning of case.  Please see her separate note.  CASE DATA:  Type of patient?: Elective [facility] Private Case  Status of Case? Elective Scheduled  Infection Present At Time Of Surgery (PATOS)?  NO    DESCRIPTION:   Informed consent was confirmed.  The patient underwent general anaesthesia without difficulty.  The patient was positioned with arms tucked & secured appropriately.  VTE prevention in place.  The patient's abdomen was clipped, prepped, & draped in a sterile fashion.  Surgical timeout confirmed our plan.  The patient was positioned in reverse Trendelenburg.  Abdominal entry was gained using Varess technique at the left subcostal ridge on the anterior abdominal wall.  No elevated EtCO2 noted.  Port placed.  Camera inspection revealed no injury.  Extra ports were carefully placed under direct laparoscopic visualization.  Inspection was made to confirm the abnormal ovaries.  Could find tattooing at the hepatic flexure that marked the distal margin of the cancer. We docked the [employer] robot carefully and placed intstruments under visualization.  All agreed to let Dr. Pricilla Holm to focus on removal of the ovaries first.  Please see Dr. Winferd Humphrey note.  At the end the case attempt was made to place it inside a 15 mm lap bag bag but firing and visualization was a challenge.  Case turned over back to myself and Dr. Cliffton Asters.  After the robot was undocked, we were able to get the ovaries placed inside and [employer] laparoscopically.    Decided to proceed with segmental  colectomy of the hepatic flexure cancer.  The patient was repositioned and robot redocked.  Reinspection confirmed the tattooing at the hepatic flexure.  Patient had a high riding cecum and quite immobile proximal right mesentery.  I mobilized & reflected the greater omentum in the upper abdomen.  Positioned the small bowel into the left lower quadrant and pelvis.  I was able to elevate the proximal colon to isolate the ileocolonic pedicle.  I scored the ileal mesentery just proximal to that.   I carried that further dissection in a medial to lateral fashion.  I was able to bluntly get into the retro-mesenteric plane on the right side.  I freed the proximal right sided colonic mesentery off the retroperitoneum including the duodenal sweep, pancreatic head, & Gerota's fascia of the right kidney. I was able to get underneath the hepatic flexure.  I was able to get underneath the proximal and mid transverse colon.  I isolated the proximal ileocecal pedicle.  I skeletonized it & transected the vessels.    I then proceeded to mobilize the terminal ileum & proximal "right" colon in a lateral to medial fashion.  I mobilized the distal ileal mesentery off its retroperitoneal and pelvic attachments.  I mobilized the ascending colon off It is side wall attachments to the paracolic gutter and retroperitoneum.  I also mobilized the greater omentum off the mid transverse colon and mobilized the mid to proximal transverse colon in a superior to inferior  fashion.  This allowed me to mobilize the hepatic flexure and get a complete mobilization of the proximal "right" colon to the mid-transverse colon.   I could isolate the pathology.   Chose an area in the distal ileum and transected the mesentery radially.  Then elevated the transverse colon.  We chose an area in the transverse colon just proximal to a dominant middle colic artery pedicle and transected that in a radial fashion to good location.  We confirmed good viability of  the ileum and transverse colon plan for anastomosis. When he went ahead and proceeded with transection.  We transected at the distal ileum with a robotic stapler 60mm blue load.  We then transected transverse colon with a robotic stapler 60mm blue load.  We confirmed hemostasis.     I did a side-to-side stapled anastomosis of ileum to mid-transverse colon using a 60mm white load in an isoperistaltic fashion.  (Distal stump of ileum to mid transverse colon for the distal end of the anastomosis.  Proximal end of colon stump to more proximal ileum for the proximal end of the anastomosis).  Mucosa looked healthy pink and viable.  No bleeding.  I sewed the common staple channel wound with an absorbable suture (V-lock) in a running [place] fashion from each corner and meeting in the center.  I did meticulous inspection to prove an airtight closure.  I protected the anastomosis line with an anterior omentopexy of greater omentum using V lock suture.    We did reinspection of the abdomen.  Hemostasis was good.   Ureters, retroperitoneum, and bowel uninjured.  Ran the small bowel from the ileocolonic anastomosis towards the ligament of Treitz to make sure the bowel rested well and there were no twisting or trapped hernias.  Her mesentery was rather stretchy and floppy but things laid well.  Anastomosis rested in the periumbilical region.  The anastomosis looked healthy.   Endoluminal gas was evacuated.  We placed the wound protector through the suprapubic 12mm port site after it was enlarged in a Pfannenstiel fashion.  Specimens removed without incident.  Both Dr. Cliffton Asters & I confirmed that the mass was just proximal to the tattooing and we had good proximal and distal margins with a good resection/high ligation of the mesentery for adequate lymph node sampling.  Ports & wound protector removed.  Hemostasis was good.  Sterile unused instruments were used from this point.  I closed the skin at the port sites using  Monocryl stitch and sterile dressing.  I closed the extraction wound using a 0 Vicryl vertical peritoneal closure and a #1 PDS transverse anterior rectal fascial closure like a small Pfannenstiel closure. I closed the skin with some interrupted Monocryl stitches.  I placed sterile dressings.     Patient is being extubated go to recovery room. I discussed postop care with the patient in detail the office & in the holding area. Instructions are written. I discussed operative findings, updated the patient's status, discussed probable steps to recovery, and gave postoperative recommendations to the patient's sister, Wyman Songster .  Recommendations were made.  Questions were answered.  She expressed understanding & appreciation.   Ardeth Sportsman, M.D., F.A.C.S. Gastrointestinal and Minimally Invasive Surgery [facility] [address], [address] [place], [place] [address] [phone] Main / Paging

## 2023-03-24 NOTE — H&P (Addendum)
Gynecologic Oncology H&P  03/24/23  Treatment History: Oncology History  Malignant neoplasm of hepatic flexure (HCC)  12/21/2022 Imaging    IMPRESSION: 1. Colon is minimally distended and no discrete colonic lesion is identified to correspond with patient's given history of colonic neoplasm. 2. Bilateral adrenal nodules measuring 12 mm in the right adrenal gland and 1 cm in the left adrenal gland are new from CT August 20 2015. These likely reflect benign adrenal adenomas however they are technically indeterminate and small metastatic lesions are not excluded, consider more definitive characterization with pre and postcontrast adrenal protocol CT or MRI versus attention on follow-up oncologic imaging. 3. Tiny bilateral pulmonary nodules along the major fissures measuring up to 2 mm are nonspecific but favored to reflect intrapulmonary lymph nodes. However, given lack of prior imaging for comparison these warrant attention on short-term interval follow-up dedicated chest CT. 4. 7.7 cm left ovarian cyst, recommend further evaluation by dedicated pelvic ultrasound. 5. Nonobstructive bilateral renal stones measure up to 4 mm in the right kidney.   12/26/2022 Initial Diagnosis   Cancer of transverse colon (HCC)     Interval History: Denies pelvic pain. Appetite normal. Bowels loose since last week, previously normal. Denies nausea or emesis.   Hysterectomy performed approximately in 2016 for fibroids, DUB and endometriosis. Minimal endometriosis findings intra-op. Normal tubes and ovaries at that time. Painful periods when had them. Denies menopausal symptoms. No abnormal pap smears.  Type 2 DM, uses insulin, Victoza, metformin. Doesn't check blood sugars at home. Has OSA, doesn't use a CPAP.  Past Medical/Surgical History: Past Medical History:  Diagnosis Date   Anemia    Anxiety    Congenital cerebral palsy (HCC)    Depression    Diabetes mellitus due to abnormal insulin  (HCC)    Dyspnea    Edema 06/10/2017   Endometriosis    Hearing impairment    Heart murmur    at birth   Hypertension    Neuromuscular disorder (HCC)    Cerebral palsy- very mild left side of brain   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 US Neck 11/06/14: Bilateral nodules. Dominant right lower pole nodule measures 2.3 cm. Findings meet consensus criteria for biopsy. Ultrasound-guided fine needle aspiration should be considered Aspiration pathology: benign follicular nodule    Past Surgical History:  Procedure Laterality Date   CESAREAN SECTION     EXPLORATORY LAPAROTOMY WITH ABDOMINAL MASS EXCISION     MYOMECTOMY N/A 08/05/2015   Procedure: Abdominal Hysterectomy, Bilateral Salpingectomy;  Surgeon: Tereso Newcomer, MD;  Location: [facility];  Service: Gynecology;  Laterality: N/A;    Family History  Problem Relation Age of Onset   Diabetes Mother    Breast cancer Mother    Cancer Mother 5       breast cancer   Heart failure Mother    Hypertension Mother    Diabetes Mellitus II Mother    Cancer Father 73       colon cancer   Diabetes Mellitus II Father    Hypertension Father    Cancer Maternal Aunt 83       breast cancer   Cancer Maternal Grandmother        lung cancer   Colon  cancer Neg Hx    Esophageal cancer Neg Hx    Pancreatic cancer Neg Hx    Ulcerative colitis Neg Hx     Social History   Socioeconomic History   Marital status: Married    Spouse name: Not on file   Number of children: 1   Years of education: Not on file   Highest education level: Not on file  Occupational History   Not on file  Tobacco Use   Smoking status: Never   Smokeless tobacco: Never  Vaping Use   Vaping Use: Never used  Substance and Sexual Activity   Alcohol  use: No   Drug use: No   Sexual activity: Not Currently    Birth control/protection: Surgical  Other Topics Concern   Not on file  Social History Narrative   Not on file   Social Determinants of Health   Financial Resource Strain: Low Risk  (04/16/2022)   Overall Financial Resource Strain (CARDIA)    Difficulty of Paying Living Expenses: Not hard at all  Food Insecurity: No Food Insecurity (12/03/2022)   Hunger Vital Sign    Worried About Running Out of Food in the Last Year: Never true    Ran Out of Food in the Last Year: Never true  Transportation Needs: Unmet Transportation Needs (12/03/2022)   PRAPARE - Transportation    Lack of Transportation (Medical): No    Lack of Transportation (Non-Medical): Yes  Physical Activity: Inactive (04/16/2022)   Exercise Vital Sign    Days of Exercise per Week: 0 days    Minutes of Exercise per Session: 0 min  Stress: No Stress Concern Present (04/16/2022)   [employer] of Occupational Health - Occupational Stress Questionnaire    Feeling of Stress : Only a little  Social Connections: Moderately Isolated (12/03/2022)   Social Connection and Isolation Panel [NHANES]    Frequency of Communication with Friends and Family: More than three times a week    Frequency of Social Gatherings with Friends and Family: More than three times a week    Attends Religious Services: More than 4 times per year    Active Member of [employer] or Organizations: No    Attends Banker Meetings: Never    Marital Status: Separated    Current Medications:  Current Facility-Administered Medications:    bupivacaine liposome (EXPAREL) 1.3 % injection 266 mg, 20 mL, Infiltration, Once, Gross, Viviann Spare, MD   cefoTEtan (CEFOTAN) 2 g in sodium chloride 0.9 % 100 mL IVPB, 2 g, Intravenous, On Call to OR, Karie Soda, MD   lactated ringers infusion, , Intravenous, Continuous, Shelton Silvas, MD  Physical Exam: BP (!) 139/92   Pulse 93   Temp 98.8 F (37.1 C)  (Oral)   Resp 16   Ht 5\' 4"  (1.626 m)   Wt 206 lb (93.4 kg)   LMP 07/23/2015 (Exact Date)   SpO2 98%   BMI 35.36 kg/m  General: Alert, oriented, no acute distress. HEENT: Posterior oropharynx clear, sclera anicteric. Chest: Unlabored breathing on room air. Abdomen: Obese, soft, nontender.  Normoactive bowel sounds.  No masses or hepatosplenomegaly appreciated.   Extremities: Grossly normal range of motion.  Warm, well perfused.  No edema bilaterally. GU: Deferred.  Laboratory & Radiologic Studies:    Latest Ref Rng & Units 03/15/2023   12:15 PM 02/04/2023   11:55 AM 12/07/2022    9:49 AM  CBC  WBC 4.0 - 10.5 K/uL 5.7  5.5    Hemoglobin 12.0 -  15.0 g/dL 9.6  9.7  8.5 Repeated and verified X2.   Hematocrit 36.0 - 46.0 % 31.5  31.3    Platelets 150 - 400 K/uL 452  361        Latest Ref Rng & Units 03/15/2023   12:15 PM 02/04/2023   11:55 AM 12/27/2022    4:12 PM  BMP  Glucose 70 - 99 mg/dL 161  096  045   BUN 6 - 20 mg/dL 5  <5  5   Creatinine 4.09 - 1.00 mg/dL 8.11  9.14  7.82   Sodium 135 - 145 mmol/L 138  137  141   Potassium 3.5 - 5.1 mmol/L 3.6  3.0  3.3   Chloride 98 - 111 mmol/L 102  101  105   CO2 22 - 32 mmol/L 26  26  32   Calcium 8.9 - 10.3 mg/dL 9.2  8.7  8.8    CT A/P on 01/16/23: Stable 1.2 cm benign right adrenal adenoma. Stable mild nodular hyperplasia of the left adrenal gland. No followup imaging is recommended.   Bilateral nephrolithiasis. No evidence of hydronephrosis.  Pelvic ultrasound 12/29/22: 1. Left ovarian cyst has diminished in size from prior CT. Additionally patient was scanned 5 days apart and the cyst diminished in size over the course of the exams, initially 6.2 cm greatest dimension, subsequently 3.6 cm greatest dimension. Overall findings suggest a resolving hemorrhagic cyst, however examination is technically challenging. In this setting, recommend a follow-up ultrasound in 6 weeks to ensure stability and or potentially resolution.  Alternatively, pelvic MRI characterization could be considered. 2. No pelvic free fluid.  Assessment & Plan: Valois Palmiter is a 52 y.o. woman with adenocarcinoma of the transverse colon undergoing definitive surgical treatment. Recent imaging showed 7 cm left ovarian cyst. Follow-up ultrasound revealed cyst 5.5 cm.  Patient is asymptomatic. Given decreased size of cyst, likely benign. Discussed options of BSO if abnormal appearing ovary or ovaries versus BSO regardless of intra-abdominal findings. Given history of endometriosis and age (although denies menopausal symptoms, she is close to average age of menopause), I think it is reasonable to proceed with BSO at the time of her colon surgery. The patient wishes to proceed with BSO.  We reviewed the plan for robotic assisted bilateral salpingo-oophorectomy, possible laparotomy, and any other indicated procedures. The risks of surgery were discussed in detail and she understands these to include infection; wound separation; hernia; injury to adjacent organs such as bowel, bladder, blood vessels, ureters and nerves; bleeding; anesthesia risk; thromboembolic events; possible death; unforeseen complications; possible need for re-exploration; medical complications such as heart attack, stroke, pleural effusion and pneumonia. The patient will receive DVT and antibiotic prophylaxis as indicated. She voiced a clear understanding. She had the opportunity to ask questions.   The patient declines blood transfusion. I discussed with anesthesia team clarifying which blood components or products she would be willing to receive.  Eugene Garnet, MD  Division of Gynecologic Oncology  Department of Obstetrics and Gynecology  [facility]

## 2023-03-24 NOTE — Interval H&P Note (Signed)
History and Physical Interval Note:  03/24/2023 7:03 AM  [name]  has presented today for surgery, with the diagnosis of COLON CANCER.  The various methods of treatment have been discussed with the patient and family. After consideration of risks, benefits and other options for treatment, the patient has consented to  Procedure(s): ROBOTIC COLECTOMY PROXIMAL (N/A) XI ROBOTIC ASSISTED SALPINGO OOPHORECTOMY (Bilateral) as a surgical intervention.  The patient's history has been reviewed, patient examined, no change in status, stable for surgery.  I have reviewed the patient's chart and labs.  Questions were answered to the patient's satisfaction.    I have re-reviewed the the patient's records, history, medications, and allergies.  I have re-examined the patient.  I again discussed intraoperative plans and goals of post-operative recovery.  The patient agrees to proceed.  [name]  1971/11/02 [id number]  Patient Care Team: Marrianne Mood, MD as PCP - General Hanley Seamen, Dustin Folks, MD as Referring Physician (Optometry) Malachy Mood, MD as Consulting Physician (Oncology) Karie Soda, MD as Consulting Physician (General Surgery) Armbruster, Willaim Rayas, MD as Consulting Physician (Gastroenterology)  Patient Active Problem List   Diagnosis Date Noted   Bilateral deafness 12/28/2022   History of colonic polyps 12/28/2022   Pelvic mass in female 12/28/2022   Malignant neoplasm of hepatic flexure (HCC) 12/26/2022   Healthcare maintenance 12/03/2022   Nasal congestion 12/03/2022   URI (upper respiratory infection) 11/22/2022   Bloody stool 11/17/2022   Chronic diarrhea of unknown origin 10/03/2022   OSA (obstructive sleep apnea) 08/02/2022   Blood transfusion declined because patient is Jehovah's Witness    Asymmetric SNHL (sensorineural hearing loss) 04/02/2022   Rhinitis, nonallergic 10/16/2021   Sore throat 10/13/2021   Need for pneumococcal vaccination 02/19/2021    Hypertension 03/21/2018   Allergic rhinitis 01/25/2018   Snoring 06/10/2017   Congenital hearing loss of right ear 12/08/2016   Schizoaffective disorder, chronic condition (HCC) 06/18/2016   Iron deficiency anemia 03/04/2015   Hyperlipidemia LDL goal <70 09/10/2014   Morbid obesity with BMI of 40.0-44.9, adult (HCC) 07/30/2014   Pap smear for cervical cancer screening 07/30/2014   Diabetes mellitus type 2, noninsulin dependent (HCC) 11/07/2012    Past Medical History:  Diagnosis Date   Anemia    Anxiety    Congenital cerebral palsy (HCC)    Depression    Diabetes mellitus due to abnormal insulin (HCC)    Dyspnea    Edema 06/10/2017   Endometriosis    Hearing impairment    Heart murmur    at birth   Hypertension    Neuromuscular disorder (HCC)    Cerebral palsy- very mild left side of brain   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 US Neck 11/06/14: Bilateral nodules. Dominant right lower pole nodule measures 2.3 cm. Findings meet consensus criteria for biopsy. Ultrasound-guided fine needle aspiration should be considered Aspiration pathology: benign follicular nodule    Past Surgical History:  Procedure Laterality Date   CESAREAN SECTION     EXPLORATORY LAPAROTOMY WITH ABDOMINAL MASS EXCISION     MYOMECTOMY N/A 08/05/2015   Procedure: Abdominal Hysterectomy, Bilateral Salpingectomy;  Surgeon: Tereso Newcomer, MD;  Location: [facility];  Service: Gynecology;  Laterality: N/A;    Social History  Socioeconomic History   Marital status: Married    Spouse name: Not on file   Number of children: 1   Years of education: Not on file   Highest education level: Not on file  Occupational History   Not on file  Tobacco Use   Smoking status: Never   Smokeless  tobacco: Never  Vaping Use   Vaping Use: Never used  Substance and Sexual Activity   Alcohol use: No   Drug use: No   Sexual activity: Not Currently    Birth control/protection: Surgical  Other Topics Concern   Not on file  Social History Narrative   Not on file   Social Determinants of Health   Financial Resource Strain: Low Risk  (04/16/2022)   Overall Financial Resource Strain (CARDIA)    Difficulty of Paying Living Expenses: Not hard at all  Food Insecurity: No Food Insecurity (12/03/2022)   Hunger Vital Sign    Worried About Running Out of Food in the Last Year: Never true    Ran Out of Food in the Last Year: Never true  Transportation Needs: Unmet Transportation Needs (12/03/2022)   PRAPARE - Transportation    Lack of Transportation (Medical): No    Lack of Transportation (Non-Medical): Yes  Physical Activity: Inactive (04/16/2022)   Exercise Vital Sign    Days of Exercise per Week: 0 days    Minutes of Exercise per Session: 0 min  Stress: No Stress Concern Present (04/16/2022)   [employer] of Occupational Health - Occupational Stress Questionnaire    Feeling of Stress : Only a little  Social Connections: Moderately Isolated (12/03/2022)   Social Connection and Isolation Panel [NHANES]    Frequency of Communication with Friends and Family: More than three times a week    Frequency of Social Gatherings with Friends and Family: More than three times a week    Attends Religious Services: More than 4 times per year    Active Member of [employer] or Organizations: No    Attends Banker Meetings: Never    Marital Status: Separated  Intimate Partner Violence: Not At Risk (12/03/2022)   Humiliation, Afraid, Rape, and Kick questionnaire    Fear of Current or Ex-Partner: No    Emotionally Abused: No    Physically Abused: No    Sexually Abused: No    Family History  Problem Relation Age of Onset   Diabetes Mother    Breast cancer Mother    Cancer Mother 53        breast cancer   Heart failure Mother    Hypertension Mother    Diabetes Mellitus II Mother    Cancer Father 69       colon cancer   Diabetes Mellitus II Father    Hypertension Father    Cancer Maternal Aunt 32       breast cancer   Cancer Maternal Grandmother        lung cancer   Colon cancer Neg Hx    Esophageal cancer Neg Hx    Pancreatic cancer Neg Hx    Ulcerative colitis Neg Hx     Medications Prior to Admission  Medication Sig Dispense Refill Last Dose   acetaminophen (TYLENOL) 500 MG tablet Take 1,000 mg by mouth every 6 (six) hours as needed for moderate pain.   Past Week   amLODipine (NORVASC) 10 MG tablet Take 1 tablet (10 mg total) by mouth daily. 30 tablet 11 Past Week   atorvastatin (LIPITOR) 40  MG tablet TAKE 1 TABLET BY MOUTH DAILY 90 tablet 3 Past Week   benzonatate (TESSALON PERLES) 100 MG capsule Take 1 capsule (100 mg total) by mouth 3 (three) times daily as needed for cough. (Patient taking differently: Take 100 mg by mouth daily as needed for cough.) 20 capsule 0    carvedilol (COREG) 6.25 MG tablet TAKE 1 TABLET BY MOUTH TWICE A DAY WITH MEALS 60 tablet 3 Past Week   DULoxetine (CYMBALTA) 30 MG capsule Take 1 capsule (30 mg total) by mouth daily. 30 capsule 3 03/23/2023   ferrous sulfate 325 (65 FE) MG EC tablet Take 1 tablet (325 mg total) by mouth every other day. 15 tablet 2 Past Week   fluticasone (FLONASE) 50 MCG/ACT nasal spray Place 2 sprays into both nostrils daily as needed for allergies or rhinitis.   Past Week   liraglutide (VICTOZA) 18 MG/3ML SOPN Inject 1.8 mg into the skin daily. 9 mL 5 Past Week   metFORMIN (GLUCOPHAGE) 1000 MG tablet Take 1 tablet (1,000 mg total) by mouth 2 (two) times daily. 180 tablet 3 Past Week   metroNIDAZOLE (FLAGYL) 500 MG tablet Take 1 tablet (500 mg total) by mouth as directed. Take 2 pills (=1000mg ) by mouth at 2pm, 3pm, and 10pm the day before your colorectal operation as discussed in [facility] office.  Call [phone] with  questions 6 tablet 0 03/23/2023   Multiple Vitamins-Minerals (HAIR SKIN & NAILS) TABS Take 1 tablet by mouth daily.   Past Week   neomycin (MYCIFRADIN) 500 MG tablet Take 2 tablets (1,000 mg total) by mouth as directed. Take 2 pills (=1000mg ) by mouth at 2pm, 3pm, and 10pm the day before your colorectal operation as discussed in [facility] office.  Call [phone] with questions 6 tablet 0 03/23/2023   omeprazole (PRILOSEC) 40 MG capsule Take 1 capsule (40 mg total) by mouth daily. 30 capsule 1 Past Week   Paliperidone ER (INVEGA SUSTENNA) injection Inject 117 mg into the muscle every 30 (thirty) days.   Past Month   predniSONE (DELTASONE) 50 MG tablet Take (1) 50 mg tablet of prednisone 13 hours prior to your procedure , Take (1) 50 mg tablet 7 hours prior to your procedure , and Take (1) 50 mg tablet 1 hour prior to your procedure 3 tablet 0 03/23/2023   predniSONE (DELTASONE) 50 MG tablet Take (1) 50 mg tablet of prednisone 13 hours prior to your procedure, take (1) 50 mg tablet 7 hours prior to your procedure, and take (1) 50 mg tablet of prednisone 1 hour prior to your procedure 3 tablet 0 03/23/2023   diphenhydrAMINE (BENADRYL) 50 MG tablet Take 50 mg of Benadryl 1 hour prior to your CT scan 1 tablet 0    guaiFENesin (MUCINEX) 600 MG 12 hr tablet Take 1 tablet (600 mg total) by mouth 2 (two) times daily. 14 tablet 1 More than a month   Insulin Pen Needle (BD PEN NEEDLE NANO U/F) 32G X 4 MM MISC USE TO INJECT VICTOZA ONCE DAILY 100 each 1    naproxen (NAPROSYN) 500 MG tablet Take 1 tablet (500 mg total) by mouth 2 (two) times daily with a meal. (Patient not taking: Reported on 12/30/2022) 30 tablet 0     Current Facility-Administered Medications  Medication Dose Route Frequency Provider Last Rate Last Admin   bupivacaine liposome (EXPAREL) 1.3 % injection 266 mg  20 mL Infiltration Once Karie Soda, MD       cefoTEtan (CEFOTAN) 2 g  in sodium chloride 0.9 % 100 mL IVPB  2 g Intravenous On Call to OR Karie Soda, MD       lactated ringers infusion   Intravenous Continuous Shelton Silvas, MD         Allergies  Allergen Reactions   Bactrim [Sulfamethoxazole-Trimethoprim] Anaphylaxis, Shortness Of Breath and Swelling   Lisinopril Cough   Losartan Cough   Bee Pollen Other (See Comments)    Seasonal allergies   Ivp Dye [Iodinated Contrast Media] Nausea And Vomiting   Metrizamide Nausea And Vomiting   Pollen Extract     Seasonal allergies   Sulfa Antibiotics Swelling    Eyes and lips swelled up   Sulfasalazine Swelling    Eyes and lips swelled up   Other     Blood Product Refusal    Peanut-Containing Drug Products Rash    BP (!) 139/92   Pulse 93   Temp 98.8 F (37.1 C) (Oral)   Resp 16   Ht 5\' 4"  (1.626 m)   Wt 93.4 kg   LMP 07/23/2015 (Exact Date)   SpO2 98%   BMI 35.36 kg/m   Labs: Results for orders placed or performed during the hospital encounter of 03/24/23 (from the past 48 hour(s))  Glucose, capillary     Status: Abnormal   Collection Time: 03/24/23  6:10 AM  Result Value Ref Range   Glucose-Capillary 134 (H) 70 - 99 mg/dL    Comment: Glucose reference range applies only to samples taken after fasting for at least 8 hours.   Comment 1 Notify RN     Imaging / Studies: No results found.   Ardeth Sportsman, M.D., F.A.C.S. Gastrointestinal and Minimally Invasive Surgery [facility] [address], [address] [place], [place] [address] [phone] Main / Paging  03/24/2023 7:03 AM    Ardeth Sportsman

## 2023-03-24 NOTE — Progress Notes (Signed)
Spoke with dr Arby Barrette about patients morning beta blocker.  Patient has not taken it in a couple of days.    Dr Arby Barrette is ok with the patient not having her beta blocker this morning.

## 2023-03-24 NOTE — Discharge Instructions (Signed)
SURGERY: POST OP INSTRUCTIONS (Surgery for small bowel obstruction, colon resection, etc)   ######################################################################  EAT Gradually transition to a high fiber diet with a fiber supplement over the next few days after discharge  WALK Walk an hour a day.  Control your pain to do that.    CONTROL PAIN Control pain so that you can walk, sleep, tolerate sneezing/coughing, go up/down stairs.  HAVE A BOWEL MOVEMENT DAILY Keep your bowels regular to avoid problems.  OK to try a laxative to override constipation.  OK to use an antidairrheal to slow down diarrhea.  Call if not better after 2 tries  CALL IF YOU HAVE PROBLEMS/CONCERNS Call if you are still struggling despite following these instructions. Call if you have concerns not answered by these instructions  ######################################################################   DIET Follow a light diet the first few days at home.  Start with a bland diet such as soups, liquids, starchy foods, low fat foods, etc.  If you feel full, bloated, or constipated, stay on a ful liquid or pureed/blenderized diet for a few days until you feel better and no longer constipated. Be sure to drink plenty of fluids every day to avoid getting dehydrated (feeling dizzy, not urinating, etc.). Gradually add a fiber supplement to your diet over the next week.  Gradually get back to a regular solid diet.  Avoid fast food or heavy meals the first week as you are more likely to get nauseated. It is expected for your digestive tract to need a few months to get back to normal.  It is common for your bowel movements and stools to be irregular.  You will have occasional bloating and cramping that should eventually fade away.  Until you are eating solid food normally, off all pain medications, and back to regular activities; your bowels will not be normal. Focus on eating a low-fat, high fiber diet the rest of your life  (See Getting to Good Bowel Health, below).  CARE of your INCISION or WOUND  It is good for closed incisions and even open wounds to be washed every day.  Shower every day.  Short baths are fine.  Wash the incisions and wounds clean with soap & water.    You may leave closed incisions open to air if it is dry.   You may cover the incision with clean gauze & replace it after your daily shower for comfort.  TEGADERM:  You have clear gauze band-aid dressings over your closed incision(s).  Remove the dressings 3 days after surgery.    If you have an open wound with a wound vac, see wound vac care instructions.    ACTIVITIES as tolerated Start light daily activities --- self-care, walking, climbing stairs-- beginning the day after surgery.  Gradually increase activities as tolerated.  Control your pain to be active.  Stop when you are tired.  Ideally, walk several times a day, eventually an hour a day.   Most people are back to most day-to-day activities in a few weeks.  It takes 4-8 weeks to get back to unrestricted, intense activity. If you can walk 30 minutes without difficulty, it is safe to try more intense activity such as jogging, treadmill, bicycling, low-impact aerobics, swimming, etc. Save the most intensive and strenuous activity for last (Usually 4-8 weeks after surgery) such as sit-ups, heavy lifting, contact sports, etc.  Refrain from any intense heavy lifting or straining until you are off narcotics for pain control.  You will have off days, but   things should improve week-by-week. DO NOT PUSH THROUGH PAIN.  Let pain be your guide: If it hurts to do something, don't do it.  Pain is your body warning you to avoid that activity for another week until the pain goes down. You may drive when you are no longer taking narcotic prescription pain medication, you can comfortably wear a seatbelt, and you can safely make sudden turns/stops to protect yourself without hesitating due to pain. You may  have sexual intercourse when it is comfortable. If it hurts to do something, stop.   MEDICATIONS Take your usually prescribed home medications unless otherwise directed.   Blood thinners:  You can restart any strong blood thinners after the second postoperative day.  It is OK to continue aspirin before & after surgery..    Some blood in BMs the first 1-2 weeks is common but should taper down & be small volume.  If you are passing many large clots, call your surgeon    PAIN CONTROL Pain after surgery or related to activity is often due to strain/injury to muscle, tendon, nerves and/or incisions.  This pain is usually short-term and will improve in a few months.  To help speed the process of healing and to get back to regular activity more quickly, DO THE FOLLOWING THINGS TOGETHER: Increase activity gradually.  DO NOT PUSH THROUGH PAIN Use Ice and/or Heat Try Gentle Massage and/or Stretching Take over the counter pain medication Take Narcotic prescription pain medication for more severe pain  Good pain control = faster recovery.  It is better to take more medicine to be more active than to stay in bed all day to avoid medications.  Increase activity gradually Avoid heavy lifting at first, then increase to lifting as tolerated over the next 6 weeks. Do not "push through" the pain.  Listen to your body and avoid positions and maneuvers than reproduce the pain.  Wait a few days before trying something more intense Walking an hour a day is encouraged to help your body recover faster and more safely.  Start slowly and stop when getting sore.  If you can walk 30 minutes without stopping or pain, you can try more intense activity (running, jogging, aerobics, cycling, swimming, treadmill, sex, sports, weightlifting, etc.) Remember: If it hurts to do it, then don't do it! Use Ice and/or Heat You will have swelling and bruising around the incisions.  This will take several weeks to resolve. Ice packs  or heating pads (6-8 times a day, 30-60 minutes at a time) will help sooth soreness & bruising. Some people prefer to use ice alone, heat alone, or alternate between ice & heat.  Experiment and see what works best for you.  Consider trying ice for the first few days to help decrease swelling and bruising; then, switch to heat to help relax sore spots and speed recovery. Shower every day.  Short baths are fine.  It feels good!  Keep the incisions and wounds clean with soap & water.   Try Gentle Massage and/or Stretching Massage at the area of pain many times a day Stop if you feel pain - do not overdo it Take over the counter pain medication This helps the muscle and nerve tissues become less irritable and calm down faster Choose ONE of the following over-the-counter anti-inflammatory medications: Acetaminophen 500mg tabs (Tylenol) 1-2 pills with every meal and just before bedtime (avoid if you have liver problems or if you have acetaminophen in you narcotic prescription) Naproxen 220mg tabs (  ex. Aleve, Naprosyn) 1-2 pills twice a day (avoid if you have kidney, stomach, IBD, or bleeding problems) Ibuprofen 200mg tabs (ex. Advil, Motrin) 3-4 pills with every meal and just before bedtime (avoid if you have kidney, stomach, IBD, or bleeding problems) Take with food/snack several times a day as directed for at least 2 weeks to help keep pain / soreness down & more manageable. Take Narcotic prescription pain medication for more severe pain A prescription for strong pain control is often given to you upon discharge (for example: oxycodone/Percocet, hydrocodone/Norco/Vicodin, or tramadol/Ultram) Take your pain medication as prescribed. Be mindful that most narcotic prescriptions contain Tylenol (acetaminophen) as well - avoid taking too much Tylenol. If you are having problems/concerns with the prescription medicine (does not control pain, nausea, vomiting, rash, itching, etc.), please call us [phone]  [phone] to see if we need to switch you to a different pain medicine that will work better for you and/or control your side effects better. If you need a refill on your pain medication, you must call the office before 4 pm and on weekdays only.  By federal law, prescriptions for narcotics cannot be called into a pharmacy.  They must be filled out on paper & picked up from our office by the patient or authorized caretaker.  Prescriptions cannot be filled after 4 pm nor on weekends.    WHEN TO CALL US [phone] Severe uncontrolled or worsening pain  Fever over 101 F (38.5 C) Concerns with the incision: Worsening pain, redness, rash/hives, swelling, bleeding, or drainage Reactions / problems with new medications (itching, rash, hives, nausea, etc.) Nausea and/or vomiting Difficulty urinating Difficulty breathing Worsening fatigue, dizziness, lightheadedness, blurred vision Other concerns If you are not getting better after two weeks or are noticing you are getting worse, contact our office [phone] for further advice.  We may need to adjust your medications, re-evaluate you in the office, send you to the emergency room, or see what other things we can do to help. The clinic staff is available to answer your questions during regular business hours (8:30am-5pm).  Please don't hesitate to call and ask to speak to one of our nurses for clinical concerns.    A surgeon from [facility] is always on call at the hospitals 24 hours/day If you have a medical emergency, go to the nearest emergency room or call 911.  FOLLOW UP in our office One the day of your discharge from the hospital (or the next business weekday), please call [facility] to set up or confirm an appointment to see your surgeon in the office for a follow-up appointment.  Usually it is 2-3 weeks after your surgery.   If you have skin staples at your incision(s), let the office know so we can set up a time  in the office for the nurse to remove them (usually around 10 days after surgery). Make sure that you call for appointments the day of discharge (or the next business weekday) from the hospital to ensure a convenient appointment time. IF YOU HAVE DISABILITY OR FAMILY LEAVE FORMS, BRING THEM TO THE OFFICE FOR PROCESSING.  DO NOT GIVE THEM TO YOUR DOCTOR.  [facility] [address], [address], [place], [place]  [address] ? [phone] - Main [phone] - Toll Free,  [phone] - Fax [email or web address]    GETTING TO GOOD BOWEL HEALTH. It is expected for your digestive tract to need a few months to get back to   normal.  It is common for your bowel movements and stools to be irregular.  You will have occasional bloating and cramping that should eventually fade away.  Until you are eating solid food normally, off all pain medications, and back to regular activities; your bowels will not be normal.   Avoiding constipation The goal: ONE SOFT BOWEL MOVEMENT A DAY!    Drink plenty of fluids.  Choose water first. TAKE A FIBER SUPPLEMENT EVERY DAY THE REST OF YOUR LIFE During your first week back home, gradually add back a fiber supplement every day Experiment which form you can tolerate.   There are many forms such as powders, tablets, wafers, gummies, etc Psyllium bran (Metamucil), methylcellulose (Citrucel), Miralax or Glycolax, Benefiber, Flax Seed.  Adjust the dose week-by-week (1/2 dose/day to 6 doses a day) until you are moving your bowels 1-2 times a day.  Cut back the dose or try a different fiber product if it is giving you problems such as diarrhea or bloating. Sometimes a laxative is needed to help jump-start bowels if constipated until the fiber supplement can help regulate your bowels.  If you are tolerating eating & you are farting, it is okay to try a gentle laxative such as double dose MiraLax, prune juice, or Milk of Magnesia.  Avoid using  laxatives too often. Stool softeners can sometimes help counteract the constipating effects of narcotic pain medicines.  It can also cause diarrhea, so avoid using for too long. If you are still constipated despite taking fiber daily, eating solids, and a few doses of laxatives, call our office. Controlling diarrhea Try drinking liquids and eating bland foods for a few days to avoid stressing your intestines further. Avoid dairy products (especially milk & ice cream) for a short time.  The intestines often can lose the ability to digest lactose when stressed. Avoid foods that cause gassiness or bloating.  Typical foods include beans and other legumes, cabbage, broccoli, and dairy foods.  Avoid greasy, spicy, fast foods.  Every person has some sensitivity to other foods, so listen to your body and avoid those foods that trigger problems for you. Probiotics (such as active yogurt, Align, etc) may help repopulate the intestines and colon with normal bacteria and calm down a sensitive digestive tract Adding a fiber supplement gradually can help thicken stools by absorbing excess fluid and retrain the intestines to act more normally.  Slowly increase the dose over a few weeks.  Too much fiber too soon can backfire and cause cramping & bloating. It is okay to try and slow down diarrhea with a few doses of antidiarrheal medicines.   Bismuth subsalicylate (ex. Kayopectate, Pepto Bismol) for a few doses can help control diarrhea.  Avoid if pregnant.   Loperamide (Imodium) can slow down diarrhea.  Start with one tablet (2mg) first.  Avoid if you are having fevers or severe pain.  ILEOSTOMY PATIENTS WILL HAVE CHRONIC DIARRHEA since their colon is not in use.    Drink plenty of liquids.  You will need to drink even more glasses of water/liquid a day to avoid getting dehydrated. Record output from your ileostomy.  Expect to empty the bag every 3-4 hours at first.  Most people with a permanent ileostomy empty their  bag 4-6 times at the least.   Use antidiarrheal medicine (especially Imodium) several times a day to avoid getting dehydrated.  Start with a dose at bedtime & breakfast.  Adjust up or down as needed.  Increase antidiarrheal medications as   directed to avoid emptying the bag more than 8 times a day (every 3 hours). Work with your wound ostomy nurse to learn care for your ostomy.  See ostomy care instructions. TROUBLESHOOTING IRREGULAR BOWELS 1) Start with a soft & bland diet. No spicy, greasy, or fried foods.  2) Avoid gluten/wheat or dairy products from diet to see if symptoms improve. 3) Miralax 17gm or flax seed mixed in 8oz. water or juice-daily. May use 2-4 times a day as needed. 4) Gas-X, Phazyme, etc. as needed for gas & bloating.  5) Prilosec (omeprazole) over-the-counter as needed 6)  Consider probiotics (Align, Activa, etc) to help calm the bowels down  Call your doctor if you are getting worse or not getting better.  Sometimes further testing (cultures, endoscopy, X-ray studies, CT scans, bloodwork, etc.) may be needed to help diagnose and treat the cause of the diarrhea. [facility] [address], [address], [place], [place]  [address] [phone] - Main.    [phone]  - Toll Free.   [phone] - Fax [email or web address]  

## 2023-03-24 NOTE — H&P (Signed)
03/24/2023   REFERRING PHYSICIAN: Ruffin Frederick*  Patient Care Team: Lafonda Mosses Wyn Forster, MD as PCP - General (Internal Medicine) Armbruster, Reeves Forth, MD (Gastroenterology) Michaell Cowing, Shawn Route, MD as Consulting Provider (General Surgery) Malachy Mood, MD (Hematology and Oncology) Lafonda Mosses Wyn Forster, MD (Internal Medicine)  PROVIDER: Jarrett Soho, MD  [facility] MRN: [id number] DOB: 03/05/1971  SUBJECTIVE   Chief Complaint: New Consultation ( Colon mass, )   [name] is a 52 y.o. female  who is seen today as an office consultation  at the request of DrAdela Lank  for evaluation of cancer of transverse colon.  History of Present Illness:  52 year old woman. Has had anemia. Found to be iron deficient. Received IV iron and EPO. Refuses transfusions for religious reasons. Request made to see gastroenterology last summer. Eventually followed up and had colonoscopy 2 weeks ago by Dr. Adela Lank. Mass noted in proximal transverse colon. Biopsy consistent with adenocarcinoma. Cystic mass in pelvis most likely consistent with ovarian cyst noted as well. Surgical medical oncology consultations requested.  Patient comes today with her sister who helps speak for her given her severe deafness needing to read lips. Patient had an abdominal hysterectomy and bilateral salpingectomy over a decade ago. Patient moves her bowels every day. She does not smoke. She does have diabetes primarily on oral hypoglycemics but may be on insulin or at least Victoza as well. She can walk maybe 10 minutes before she gets short of breath. No exertional chest pain. She is on Coreg but does not have a cardiologist as far as I can tell. They were describing the teaching service at [facility] health.  Medical History:  Past Medical History:  Diagnosis Date  Anemia  Diabetes mellitus without complication (CMS-HCC)  Sleep apnea   Patient Active Problem List  Diagnosis  Malignant  neoplasm of hepatic flexure (CMS-HCC)  History of colonic polyps  Pelvic mass in female  Transfusion of blood product refused for religious reason  Bilateral deafness  Diabetes mellitus type 2, noninsulin dependent (CMS-HCC)   Past Surgical History:  Procedure Laterality Date  HYSTERECTOMY    Allergies  Allergen Reactions  Sulfamethoxazole-Trimethoprim Anaphylaxis, Shortness Of Breath and Swelling  Lisinopril Cough  Losartan Cough  Iodinated Contrast Media Nausea And Vomiting  Sulfasalazine Swelling  Eyes and lips swelled up   Current Outpatient Medications on File Prior to Visit  Medication Sig Dispense Refill  atorvastatin (LIPITOR) 40 MG tablet Take 40 mg by mouth once daily  carvediloL (COREG) 6.25 MG tablet Take by mouth  DULoxetine (CYMBALTA) 30 MG DR capsule Take by mouth  guaiFENesin (MUCINEX) 600 mg SR tablet Take 600 mg by mouth 2 (two) times daily  metFORMIN (GLUCOPHAGE) 1000 MG tablet Take 1 tablet by mouth 2 (two) times daily  naproxen (NAPROSYN) 500 MG tablet Take by mouth  VICTOZA 2-PAK 0.6 mg/0.1 mL (18 mg/3 mL) pen injector Inject subcutaneously   No current facility-administered medications on file prior to visit.   Family History  Problem Relation Age of Onset  Breast cancer Mother  Diabetes Mother  High blood pressure (Hypertension) Mother    Social History   Tobacco Use  Smoking Status Never  Smokeless Tobacco Never    Social History   Socioeconomic History  Marital status: Married  Tobacco Use  Smoking status: Never  Smokeless tobacco: Never  Substance and Sexual Activity  Alcohol use: Not Currently  Drug use: Never   ############################################################  Review of Systems: A complete review of systems (ROS) was  obtained from the patient.  We have reviewed this information and discussed as appropriate with the patient.  See HPI as well for other pertinent ROS.  Constitutional: No fevers, chills, sweats.  Weight stable Eyes: No vision changes, No discharge HENT: No sore throats, nasal drainage Lymph: No neck swelling, No bruising easily Pulmonary: No cough, productive sputum CV: No orthopnea, PND . No exertional chest/neck/shoulder/arm pain. Patient can walk 20 minutes without difficulty.   GI: No personal nor family history of inflammatory bowel disease, irritable bowel syndrome, allergy such as Celiac Sprue, dietary/dairy problems, colitis, ulcers nor gastritis. No recent sick contacts/gastroenteritis. No travel outside the country. No changes in diet.  Renal: No UTIs, No hematuria Genital: No drainage, bleeding, masses Musculoskeletal: No severe joint pain. Good ROM major joints Skin: No sores or lesions Heme/Lymph: No easy bleeding. No swollen lymph nodes. Chronic anemia iron deficient. Getting iron infusions. Refuses blood products for religious reasons. Neuro: No active seizures. No facial droop Psych: No hallucinations. No agitation  OBJECTIVE   Vitals:  12/28/22 1111  BP: 120/80  Pulse: (!) 111  Temp: 36.1 C (97 F)  SpO2: 98%  Weight: 98 kg (216 lb)  Height: 152.4 cm (5')   Body mass index is 42.18 kg/m.  PHYSICAL EXAM:  Constitutional: Not cachectic. Hygeine adequate. Vitals signs as above.  Eyes: No glasses. Vision adequate,Pupils reactive, normal extraocular movements. Sclera nonicteric Neuro: CN II-XII intact. No major focal sensory defects. No major motor deficits. Lymph: No head/neck/groin lymphadenopathy Psych: No severe agitation. No severe anxiety. Judgment & insight Adequate, Oriented x4, HENT: Normocephalic, Mucus membranes moist. No thrush. Hearing: very poor Neck: Supple, No tracheal deviation. No obvious thyromegaly Chest: No pain to chest wall compression. Good respiratory excursion. No audible wheezing CV: Pulses intact. regular. No major extremity edema Ext: No obvious deformity or contracture. Edema: Not present. No cyanosis Skin: No major  subcutaneous nodules. Warm and dry Musculoskeletal: Severe joint rigidity not present. No obvious clubbing. No digital petechiae. Mobility: no assist device moving easily without restrictions  Abdomen: Obese Soft. Nondistended. Tenderness at umbilicus only. No hernia. [name] Hernia: Not present. Diastasis recti: Not present. No hepatomegaly. No splenomegaly.  Genital/Pelvic: Inguinal hernia: Not present. Inguinal lymph nodes: without lymphadenopathy nor hidradenitis.   Rectal: (Deferred)    ###################################################################  Labs, Imaging and Diagnostic Testing:  Located in 'Care Everywhere' section of Epic EMR chart  PRIOR [facility] CLINIC NOTES:  Not applicable  SURGERY NOTES:  Not applicable  PATHOLOGY:  Located in 'Care Everywhere' section of Epic EMR chart  Assessment and Plan:  DIAGNOSES:  Diagnoses and all orders for this visit:  Malignant neoplasm of hepatic flexure (CMS-HCC)  History of colonic polyps  Pelvic mass in female  Transfusion of blood product refused for religious reason  Bilateral deafness  Diabetes mellitus type 2, noninsulin dependent (CMS-HCC)  Other orders - polyethylene glycol (MIRALAX) powder; Take 233.75 g by mouth once for 1 dose Take according to your procedure prep instructions. - bisacodyL (DULCOLAX) 5 mg EC tablet; Take 4 tablets (20 mg total) by mouth once daily as needed for Constipation for up to 1 dose - metroNIDAZOLE (FLAGYL) 500 MG tablet; Take 2 tablets (1,000 mg total) by mouth 3 (three) times daily for 3 doses SEE BOWEL PREP INSTRUCTIONS: Take 2 tablets at 2pm, 3pm, and 10pm the day prior to your colon operation. - neomycin 500 mg tablet; Take 2 tablets (1,000 mg total) by mouth 3 (three) times daily for 3 doses SEE BOWEL PREP INSTRUCTIONS:  Take 2 tablets at 2pm, 3pm, and 10pm the day prior to your colon operation.    ASSESSMENT/PLAN  Pleasant woman with mass in the colon consistent with  adenocarcinoma. Described as at hepatic flexure. Looking at CAT scan there are clips at the ascending colon and at the splenic flexure. That is where polyps were removed by Dr. Adela Lank and the cancer was in between, NOT clipped. To my view it looks like there is thickening of the colon in the redundant floppy proximal transverse colon. I recommended surgical segmental resection. Reasonable candidate for a robotic section with intracorporeal anastomosis.   The anatomy & physiology of the digestive tract was discussed. The pathophysiology of the colon was discussed. Natural history risks without surgery was discussed. I feel the risks of no intervention will lead to serious problems that outweigh the operative risks; therefore, I recommended a partial colectomy to remove the pathology. Minimally invasive (Robotic/Laparoscopic) & open techniques were discussed.   Risks such as bleeding, infection, abscess, leak, reoperation, injury to other organs, need for repair of tissues / organs, possible ostomy, hernia, heart attack, stroke, death, and other risks were discussed. I noted a good likelihood this will help address the problem. Goals of post-operative recovery were discussed as well. Need for adequate nutrition, daily bowel regimen and healthy physical activity, to optimize recovery was noted as well. We will work to minimize complications. Educational materials were available as well. Questions were answered. The patient expresses understanding & wishes to proceed with surgery.  Patient with adnexal mass.  Recommendation made for concurrent salpingo-oophorectomy by Dr. Pricilla Holm.  Please see her separate note.  Patient first case last motnh had been scheduled but the patient did not do the bowel prep despite efforts to communicate with her and her family by our office numerous times.  Had a long discussion between our office and the patient's support.  Off ice RN has discussed with psychiatric nurse, [place],  who  has been working with pt & family to ensure compliancedid discuss bowel prep with the patient as well as family.   Ardeth Sportsman, MD, FACS, MASCRS Esophageal, Gastrointestinal & Colorectal Surgery Robotic and Minimally Invasive Surgery  [facility] [address] [place], [place] [address] [phone] Fax [phone] Main  CONTACT INFORMATION:  Weekday (9AM-5PM): Call [facility] main office at [phone]  Weeknight (5PM-9AM) or Weekend/Holiday: Check [email or web address] (password " [email or web address]") for General Surgery [facility] coverage  (Please, do not use SecureChat as it is not reliable communication to reach operating surgeons for immediate patient care given surgeries/outpatient duties/clinic/cross-coverage/off post-call which would lead to a delay in care.  Epic staff messaging available for outptient concerns, but may not be answered for 48 hours or more).    03/24/2023

## 2023-03-24 NOTE — Transfer of Care (Signed)
Immediate Anesthesia Transfer of Care Note  Patient: Purpose Barca  Procedure(s) Performed: ROBOTIC RIGHT COLECTOMY PROXIMAL (Abdomen) XI ROBOTIC ASSISTED  BILATERAL  OOPHORECTOMY, LYSIS OF ADHESIONS (Bilateral: Abdomen)  Patient Location: PACU  Anesthesia Type:General  Level of Consciousness: awake and alert   Airway & Oxygen Therapy: Patient Spontanous Breathing and Patient connected to face mask oxygen  Post-op Assessment: Report given to RN and Post -op Vital signs reviewed and stable  Post vital signs: Reviewed and stable  Last Vitals:  Vitals Value Taken Time  BP 150/95 03/24/23 1147  Temp    Pulse 83 03/24/23 1152  Resp 14 03/24/23 1152  SpO2 96 % 03/24/23 1152  Vitals shown include unvalidated device data.  Last Pain:  Vitals:   03/24/23 0626  TempSrc: Oral  PainSc:       Patients Stated Pain Goal: 3 (03/24/23 0554)  Complications: No notable events documented.

## 2023-03-24 NOTE — Op Note (Signed)
OPERATIVE NOTE  Pre-operative Diagnosis: Adnexal mass, history of endometriosis  Post-operative Diagnosis: same, pelvic adhesions  Operation: Robotic-assisted laparoscopic bilateral oophorectomy, lysis of adhesions for approximately 25 minutes  Surgeon: Eugene Garnet MD  Assistant Surgeon: Antionette Char MD (an MD assistant was necessary for tissue manipulation, management of robotic instrumentation, retraction and positioning due to the complexity of the case and hospital policies).   Anesthesia: GET  Urine Output: see I&O flowsheet  Operative Findings: Some pelvic adhesions with sigmoid colon and sigmoid colon epiploica adherent to the anterior abdominal wall and left abdominal wall just above the pelvis.  Colon otherwise normal in appearance.  No ascites.  Right ovary mildly enlarged, no cyst noted.  Left ovary approximately 5-6 cm, smooth in appearance.  Left ovary somewhat retroperitonealized as well as adherent to the sigmoid mesentery.  No obvious powder burn lesions or other evidence of endometriosis within the pelvis.  Estimated Blood Loss:  minimal for my portion of the procedure      Total IV Fluids: see I&O flowsheet         Specimens: bilateral ovaries         Complications:  None apparent; patient tolerated the procedure well.         Disposition: PACU - hemodynamically stable.  Procedure Details  The patient was seen in the Holding Room. The risks, benefits, complications, treatment options, and expected outcomes were discussed with the patient.  The patient concurred with the proposed plan, giving informed consent. The patient was identified as [name] and the procedure verified as a Robotic-assisted bilateral oophorectomy with any other indicated procedures.   After induction of anesthesia, the patient was draped and prepped in the usual sterile manner. Please see Dr. Michaell Cowing' operative note for these details.  A surgical timeout was then  performed.  Intra-abdominal access and port placement was performed by Dr. Michaell Cowing. Once ports had been placed under direct visualization, the patient was placed in Trendelenburg and the robot was docked in the standard fashion.   The right peritoneum were opened parallel to the IP ligament to open the retroperitoneal space. The remnant round ligament was transected. The ureter was noted to be on the medial leaf of the broad ligament.  The peritoneum above the ureter was incised and stretched and the infundibulopelvic ligament was skeletonized, cauterized and cut.  The ovary was separated from its attachment to the medial leaf of the broad ligament, freeing it.  Attention was then turned to the left.  The sigmoid colon and epiploica were noted to be adherent to the left abdominal sidewall and over the IP ligament.  A common of blunt dissection, sharp dissection, and short bursts of monopolar electrocautery were used to lyse these adhesions and mobilized the sigmoid colon from the sidewall.  What appeared to be the remnant round ligament was elevated to help with this dissection.  Once the colon was mobilized, the retroperitoneum on the left was opened parallel to the IP ligament.  The ureter was noted along the medial leaf of the broad ligament and the peritoneum above the ureter was incised and stretched.  The infundibulopelvic ligament was skeletonized, cauterized, and transected.  The ovary was elevated and continued dissection using blunt dissection and monopolar electrocautery was used to free the ovary from the surrounding sigmoid mesentery and left pelvic sidewall, visualizing the ureter during this dissection.  Ultimately, the ovary was freed from the remnant uterine vessels.  Both ovaries were left in the pelvis to be removed  in a contained fashion by Dr. Michaell Cowing.  Excellent hemostasis was noted.  The case was turned back over to Dr. Michaell Cowing.  Eugene Garnet, MD

## 2023-03-24 NOTE — Anesthesia Procedure Notes (Signed)
Procedure Name: Intubation Date/Time: 03/24/2023 7:57 AM  Performed by: Deri Fuelling, CRNAPre-anesthesia Checklist: Patient identified, Emergency Drugs available, Suction available and Patient being monitored Patient Re-evaluated:Patient Re-evaluated prior to induction Oxygen Delivery Method: Circle system utilized Preoxygenation: Pre-oxygenation with 100% oxygen Induction Type: IV induction Ventilation: Mask ventilation without difficulty Laryngoscope Size: Mac and 3 Grade View: Grade II Tube type: Oral Number of attempts: 1 Airway Equipment and Method: Stylet and Oral airway Placement Confirmation: ETT inserted through vocal cords under direct vision, positive ETCO2 and breath sounds checked- equal and bilateral Secured at: 21 cm Tube secured with: Tape Dental Injury: Teeth and Oropharynx as per pre-operative assessment

## 2023-03-25 ENCOUNTER — Encounter (HOSPITAL_COMMUNITY): Payer: Self-pay | Admitting: Surgery

## 2023-03-25 LAB — CBC
HCT: 19.6 % — ABNORMAL LOW (ref 36.0–46.0)
Hemoglobin: 5.9 g/dL — CL (ref 12.0–15.0)
MCH: 23.6 pg — ABNORMAL LOW (ref 26.0–34.0)
MCHC: 30.1 g/dL (ref 30.0–36.0)
MCV: 78.4 fL — ABNORMAL LOW (ref 80.0–100.0)
Platelets: 294 10*3/uL (ref 150–400)
RBC: 2.5 MIL/uL — ABNORMAL LOW (ref 3.87–5.11)
RDW: 19.6 % — ABNORMAL HIGH (ref 11.5–15.5)
WBC: 7.1 10*3/uL (ref 4.0–10.5)
nRBC: 0 % (ref 0.0–0.2)

## 2023-03-25 LAB — BASIC METABOLIC PANEL
Anion gap: 9 (ref 5–15)
BUN: <5 mg/dL — ABNORMAL LOW (ref 6–20)
CO2: 25 mmol/L (ref 22–32)
Calcium: 7.9 mg/dL — ABNORMAL LOW (ref 8.9–10.3)
Chloride: 101 mmol/L (ref 98–111)
Creatinine, Ser: 0.75 mg/dL (ref 0.44–1.00)
GFR, Estimated: >60 mL/min (ref >60)
Glucose, Bld: 197 mg/dL — ABNORMAL HIGH (ref 70–99)
Potassium: 3.5 mmol/L (ref 3.5–5.1)
Sodium: 135 mmol/L (ref 135–145)

## 2023-03-25 LAB — GLUCOSE, CAPILLARY
Glucose-Capillary: 143 mg/dL — ABNORMAL HIGH (ref 70–99)
Glucose-Capillary: 189 mg/dL — ABNORMAL HIGH (ref 70–99)
Glucose-Capillary: 150 mg/dL — ABNORMAL HIGH (ref 70–99)
Glucose-Capillary: 165 mg/dL — ABNORMAL HIGH (ref 70–99)

## 2023-03-25 LAB — MAGNESIUM: Magnesium: 1.4 mg/dL — ABNORMAL LOW (ref 1.7–2.4)

## 2023-03-25 MED ORDER — TAB-A-VITE/IRON PO TABS
1.0000 | ORAL_TABLET | Freq: Every day | ORAL | Status: DC
  Administered 2023-03-25: 1 via ORAL
  Filled 2023-03-25 (×2): qty 1

## 2023-03-25 MED ORDER — SODIUM CHLORIDE 0.9 % IV SOLN
125.0000 mg | Freq: Once | INTRAVENOUS | Status: AC
  Administered 2023-03-25: 125 mg via INTRAVENOUS
  Filled 2023-03-25: qty 10

## 2023-03-25 MED ORDER — TRAMADOL HCL 50 MG PO TABS: 50.0000 mg | ORAL_TABLET | Freq: Four times a day (QID) | ORAL | 0 refills | Status: DC | PRN

## 2023-03-25 MED ORDER — INSULIN ASPART 100 UNIT/ML IJ SOLN
0.0000 [IU] | Freq: Three times a day (TID) | INTRAMUSCULAR | Status: DC
  Administered 2023-03-25: 3 [IU] via SUBCUTANEOUS
  Administered 2023-03-25 – 2023-03-26 (×3): 2 [IU] via SUBCUTANEOUS
  Administered 2023-03-26: 3 [IU] via SUBCUTANEOUS
  Administered 2023-03-27: 2 [IU] via SUBCUTANEOUS

## 2023-03-25 MED ORDER — CARVEDILOL 6.25 MG PO TABS
6.2500 mg | ORAL_TABLET | Freq: Two times a day (BID) | ORAL | Status: DC
  Administered 2023-03-25 – 2023-03-27 (×5): 6.25 mg via ORAL
  Filled 2023-03-25 (×5): qty 1

## 2023-03-25 MED ORDER — INSULIN ASPART 100 UNIT/ML IJ SOLN: 0.0000 [IU] | Freq: Every day | INTRAMUSCULAR | Status: DC

## 2023-03-25 MED ORDER — METFORMIN HCL 500 MG PO TABS
1000.0000 mg | ORAL_TABLET | Freq: Two times a day (BID) | ORAL | Status: DC
  Administered 2023-03-25 – 2023-03-27 (×5): 1000 mg via ORAL
  Filled 2023-03-25 (×5): qty 2

## 2023-03-25 NOTE — Progress Notes (Signed)
1 Day Post-Op Procedure(s) (LRB): ROBOTIC RIGHT COLECTOMY PROXIMAL (N/A) XI ROBOTIC ASSISTED  BILATERAL  OOPHORECTOMY, LYSIS OF ADHESIONS (Bilateral)  Subjective: Patient reports doing well this am. No nausea or emesis reported this am. Tolerating sipping on water, chicken broth. Reporting flatus. She has not been out of bed. Had some mild dizziness this am. Abdomen "hurting a little." No concerns voiced.  Objective: Vital signs in last 24 hours: Temp:  [96.5 F (35.8 C)-99.6 F (37.6 C)] 97.8 F (36.6 C) (05/10 0723) Pulse Rate:  [85-116] 102 (05/10 0723) Resp:  [10-18] 18 (05/10 0318) BP: (112-157)/(66-95) 127/66 (05/10 0723) SpO2:  [88 %-100 %] 100 % (05/10 0723)    Intake/Output from previous day: 05/09 0701 - 05/10 0700 In: 3281.2 [P.O.:960; I.V.:2157.3; IV Piggyback:164] Out: 2350 [Urine:2300; Blood:50]  Physical Examination: General: alert, cooperative, and no distress Resp: clear to auscultation bilaterally Cardio: mildly tachycardic, regular in rhythm. GI: incision: abdominal incisions intact with no active drainage or bleeding and abdomen obese, soft, active bowel sounds, appropriately tender Extremities: extremities normal, atraumatic, no cyanosis or edema  Labs: WBC/Hgb/Hct/Plts:  7.1/5.9/19.6/294 (05/10 1610) BUN/Cr/glu/ALT/AST/amyl/lip:  <5/0.75/--/--/--/--/-- (05/10 9604)  Assessment: 52 y.o. s/p Procedure(s): ROBOTIC RIGHT COLECTOMY PROXIMAL, XI ROBOTIC ASSISTED  BILATERAL  OOPHORECTOMY, LYSIS OF ADHESIONS: stable Pain:  Pain is well-controlled on PRN medications.  Heme: Hgb 5.9 and Hct 19.6 this am. Given IV iron. Pt jehovah's witness. EBL intraop 50. Hx chronic iron def anemia.  ID: WBC 7.1 this am. Given decadron intra-op along with cefotetan.   CV: BP and HR overall stable. Mildly tachycardic. Anticoag on hold given drop in H&H.  GI:  Tolerating po: yes, sips of liquids. S/P ROBOTIC PROXIMAL RIGHT COLECTOMY WITH INTRACORPOREAL ANASTOMOSIS   GU:  Creatinine 0.75 this am. Adequate urine output overnight. Foley in place.    FEN: No critical values on am labs. Magnesium 1.4-replacement per primary team.  Endo: Diabetes mellitus Type II, under good control.  CBG: CBG (last 3)  Recent Labs    03/24/23 0610 03/24/23 1156 03/25/23 0846  GLUCAP 134* 217* 143*     Prophylaxis: SCDs only. Anticoag on hold.  Plan: Continue plan of care per Dr. Michaell Cowing No needs voiced per pt   LOS: 1 day    Iona Stay D Elzina Devera 03/25/2023, 8:59 AM

## 2023-03-25 NOTE — Telephone Encounter (Signed)
2nd attempt to reach patient. Her phone just rings. Looks like patient had surgery yesterday and is still admitted. Will mail letter to patient with reminder.

## 2023-03-25 NOTE — Progress Notes (Signed)
OT Cancellation Note  Patient Details Name: [name] MRN: [id number] DOB: 1971-01-28   Cancelled Treatment:    Reason Eval/Treat Not Completed: Medical issues which prohibited therapy. Pt is currently with sub-therapeutic hemoglobin of 5.9   Reuben Likes, OTR/L 03/25/2023, 1:35 PM

## 2023-03-25 NOTE — Progress Notes (Signed)
[name] [id number] 1971/01/05  CARE TEAM:  PCP: Marrianne Mood, MD  Outpatient Care Team: Patient Care Team: Marrianne Mood, MD as PCP - General Hanley Seamen, Dustin Folks, MD as Referring Physician (Optometry) Malachy Mood, MD as Consulting Physician (Oncology) Karie Soda, MD as Consulting Physician (General Surgery) Armbruster, Willaim Rayas, MD as Consulting Physician (Gastroenterology) Carver Fila, MD as Consulting Physician (Gynecologic Oncology)  Inpatient Treatment Team: Treatment Team: Attending Provider: Karie Soda, MD; Consulting Physician: Carver Fila, MD; Charge Nurse: Idelia Salm, RN   Problem List:   Principal Problem:   Malignant neoplasm of hepatic flexure [facility]) Active Problems:   Schizoaffective disorder, chronic condition (HCC)   Bilateral deafness   Diabetes mellitus type 2, noninsulin dependent (HCC)   Blood transfusion declined because patient is Jehovah's Witness   OSA (obstructive sleep apnea)   Pelvic mass in female   Cyst of left ovary   History of endometriosis   Colon cancer (HCC)   1 Day Post-Op  03/24/2023  POST-OPERATIVE DIAGNOSIS:  CANCER OF HEPATIC FLEXURE OF COLON   PROCEDURE:   ROBOTIC PROXIMAL RIGHT COLECTOMY WITH INTRACORPOREAL ANASTOMOSIS TRANSVERSUS ABDOMINIS PLANE (TAP) BLOCK - BILATERAL   SURGEON:  Ardeth Sportsman, MD  OR FINDINGS:    Patient had tattooing at hepatic flexure consistent with suspected mass seen by colonoscopy and CT scan of adenocarcinoma.  No obvious metastatic disease on visceral parietal peritoneum or liver.   It is an  isoperistaltic ileocolonic (distal ileum to mid transverse colon ) anastomosis that rests in the periumbilical region.   Patient with mildly enlarged atrophic right ovary excised.  Patient with more bulky complex cystic mass involving the left ovary.  Bilateral oophorectomy done by Dr. Pricilla Holm at beginning of case.  Please see her separate  note.  ##########################   OPERATIVE NOTE  Pre-operative Diagnosis: Adnexal mass, history of endometriosis   Post-operative Diagnosis: same, pelvic adhesions   Operation:  Robotic-assisted laparoscopic bilateral oophorectomy lysis of adhesions for approximately 25 minutes   Surgeon: Eugene Garnet MD          Assessment  Postoperative on top of chronic iron deficiency anemia from bleeding tumor in a patient to schedule his weakness and refuses transfusions.  [facility] Stay = 1 days)  Plan:  -IV iron given already.  Get back on some oral iron.    -Hold anticoagulation prophylactically for at least the next 48 hours and follow hemoglobin.  Follow-up on pathology.  ERAS protocol.  Chronic hypertension.  Continue Coreg amlodipine with low threshold to hold if blood pressure soft.  Follow off IV fluids with backup as needed.  Try and keep her on the dry side to avoid fluid overload.  Try and get up and mobilize.  Continue baseline psychiatric medications as well.  Diabetic control.  Sliding scale insulin.  Restart metformin with low threshold to hold if she is normal or hypoglycemic.  Sleep apnea on oxygen.  Hopefully can wean off.  -VTE prophylaxis- SCDs only  -mobilize as tolerated to help recovery.  Asked therapist to help evaluate given her psychiatric and other issues to make sure they do not feel that she does not have any equipment needs or need for home health.  Disposition:  Disposition:  The patient is from: Home  Anticipate discharge to:  Home  Anticipated Date of Discharge is:  May 14,2024    Barriers to discharge:  Therapy assessment & Recommendations pending and Pending Clinical improvement (more likely than not)  Patient currently is  NOT MEDICALLY STABLE for discharge from the hospital from a surgery standpoint.      I reviewed nursing notes, last 24 h vitals and pain scores, last 48 h intake and output, last 24 h labs and  trends, and last 24 h imaging results. I have reviewed this patient's available data, including medical history, events of note, test results, etc as part of my evaluation.  A significant portion of that time was spent in counseling.  Care during the described time interval was provided by me.  This care required moderate level of medical decision making.  03/25/2023    Subjective: (Chief complaint)  Patient with some soreness.  Not asking for more Tylenol.  Family in room.  Hemoglobin 5.9 this morning.  Objective:  Vital signs:  Vitals:   03/24/23 1710 03/24/23 1943 03/24/23 2312 03/25/23 0318  BP: 115/89 118/72 129/75 113/69  Pulse: (!) 115 (!) 116 (!) 110 (!) 105  Resp:  18 18 18   Temp: 98.6 F (37 C) 98.9 F (37.2 C) 99.6 F (37.6 C) 98.2 F (36.8 C)  TempSrc: Oral Oral Oral Oral  SpO2: 100% 100% 100% (!) 88%  Weight:      Height:           Intake/Output   Yesterday:  05/09 0701 - 05/10 0700 In: 2825 [P.O.:720; I.V.:1941.6; IV Piggyback:163.4] Out: 2350 [Urine:2300; Blood:50] This shift:  Total I/O In: 905 [P.O.:600; I.V.:241.6; IV Piggyback:63.4] Out: 1000 [Urine:1000]  Bowel function:  Flatus: No  BM:  No  Drain: (No drain)   Physical Exam:  General: Pt awake/alert in no acute distress Eyes: PERRL, normal EOM.  Sclera clear.  No icterus Neuro: CN II-XII intact w/o focal sensory/motor deficits. Lymph: No head/neck/groin lymphadenopathy Psych:  No delerium/psychosis/paranoia.  Oriented x 4.  Still with somewhat flat affect and responding. HENT: Normocephalic, Mucus membranes moist.  No thrush.  Remains very hard of hearing/deaf but reads lips well. Neck: Supple, No tracheal deviation.  No obvious thyromegaly Chest: No pain to chest wall compression.  Good respiratory excursion.  No audible wheezing CV:  Pulses intact.  Regular rhythm.  No major extremity edema MS: Normal AROM mjr joints.  No obvious deformity  Abdomen: Soft.  Nondistended.  Mildly  tender at incisions only.  No evidence of peritonitis.  No incarcerated hernias.  Ext:   No deformity.  No mjr edema.  No cyanosis Skin: No petechiae / purpurea.  No major sores.  Warm and dry    Results:   Cultures: No results found for this or any previous visit (from the past 720 hour(s)).  Labs: Results for orders placed or performed during the hospital encounter of 03/24/23 (from the past 48 hour(s))  Glucose, capillary     Status: Abnormal   Collection Time: 03/24/23  6:10 AM  Result Value Ref Range   Glucose-Capillary 134 (H) 70 - 99 mg/dL    Comment: Glucose reference range applies only to samples taken after fasting for at least 8 hours.   Comment 1 Notify RN   Glucose, capillary     Status: Abnormal   Collection Time: 03/24/23 11:56 AM  Result Value Ref Range   Glucose-Capillary 217 (H) 70 - 99 mg/dL    Comment: Glucose reference range applies only to samples taken after fasting for at least 8 hours.  CBC     Status: Abnormal   Collection Time: 03/25/23  5:04 AM  Result Value Ref Range   WBC 7.1 4.0 - 10.5 K/uL  RBC 2.50 (L) 3.87 - 5.11 MIL/uL   Hemoglobin 5.9 (LL) 12.0 - 15.0 g/dL    Comment: REPEATED TO VERIFY THIS CRITICAL RESULT HAS VERIFIED AND BEEN CALLED TO R. RIMANDO, RN BY KATIE DAVIS ON 05 10 2024 AT 0538, AND HAS BEEN READ BACK.     HCT 19.6 (L) 36.0 - 46.0 %   MCV 78.4 (L) 80.0 - 100.0 fL   MCH 23.6 (L) 26.0 - 34.0 pg   MCHC 30.1 30.0 - 36.0 g/dL   RDW 16.1 (H) 09.6 - 04.5 %   Platelets 294 150 - 400 K/uL   nRBC 0.0 0.0 - 0.2 %    Comment: Performed at [facility], [address] [place], [place] [address]    Imaging / Studies: No results found.  Medications / Allergies: per chart  Antibiotics: Anti-infectives (From admission, onward)    Start     Dose/Rate Route Frequency Ordered Stop   03/24/23 2000  cefoTEtan (CEFOTAN) 2 g in sodium chloride 0.9 % 100 mL IVPB        2 g 200 mL/hr over 30 Minutes Intravenous Every 12  hours 03/24/23 1354 03/24/23 2110   03/24/23 1400  neomycin (MYCIFRADIN) tablet 1,000 mg  Status:  Discontinued       See Hyperspace for full Linked Orders Report.   1,000 mg Oral 3 times per day 03/24/23 0540 03/24/23 0551   03/24/23 1400  metroNIDAZOLE (FLAGYL) tablet 1,000 mg  Status:  Discontinued       See Hyperspace for full Linked Orders Report.   1,000 mg Oral 3 times per day 03/24/23 0540 03/24/23 0551   03/24/23 0600  cefoTEtan (CEFOTAN) 2 g in sodium chloride 0.9 % 100 mL IVPB        2 g 200 mL/hr over 30 Minutes Intravenous On call to O.R. 03/24/23 0540 03/24/23 1914         Note: Portions of this report may have been transcribed using voice recognition software. Every effort was made to ensure accuracy; however, inadvertent computerized transcription errors may be present.   Any transcriptional errors that result from this process are unintentional.    Ardeth Sportsman, MD, FACS, MASCRS Esophageal, Gastrointestinal & Colorectal Surgery Robotic and Minimally Invasive Surgery  [facility] [address], [address] [place], [place] [address] [phone] Fax [phone] Main  CONTACT INFORMATION:  Weekday (9AM-5PM): Call [facility] main office at [phone]  Weeknight (5PM-9AM) or Weekend/Holiday: Check [email or web address] (password " [email or web address]") for General Surgery [facility] coverage  (Please, do not use SecureChat as it is not reliable communication to reach operating surgeons for immediate patient care given surgeries/outpatient duties/clinic/cross-coverage/off post-call which would lead to a delay in care.  Epic staff messaging available for outptient concerns, but may not be answered for 48 hours or more).     03/25/2023  5:53 AM

## 2023-03-25 NOTE — Progress Notes (Signed)
Date and time results received: 03/25/23  0537 am. (use smartphrase ".now" to insert current time)  Test: CBC Critical Value: Hgb 5.9  Name of Provider Notified: Dr.Steven Gross  Orders Received? Or Actions Taken?: with new orders in and carried out.  Ferritin infusion.

## 2023-03-25 NOTE — Progress Notes (Signed)
PT Cancellation Note  Patient Details Name: [name] MRN: [id number] DOB: 1971/03/30   Cancelled Treatment:    Reason Eval/Treat Not Completed: Patient not medically ready PT orders received, chart reviewed. Pt noted to have critically low Hgb (5.9) & PT contraindicated at this time. Will f/u as able once pt is medically appropriate for PT evaluation.  Aleda Grana, PT, DPT 03/25/23, 8:08 AM   [name] 03/25/2023, 8:08 AM

## 2023-03-26 LAB — GLUCOSE, CAPILLARY
Glucose-Capillary: 119 mg/dL — ABNORMAL HIGH (ref 70–99)
Glucose-Capillary: 132 mg/dL — ABNORMAL HIGH (ref 70–99)
Glucose-Capillary: 169 mg/dL — ABNORMAL HIGH (ref 70–99)
Glucose-Capillary: 159 mg/dL — ABNORMAL HIGH (ref 70–99)

## 2023-03-26 LAB — POTASSIUM: Potassium: 2.9 mmol/L — ABNORMAL LOW (ref 3.5–5.1)

## 2023-03-26 LAB — CREATININE, SERUM
Creatinine, Ser: 0.64 mg/dL (ref 0.44–1.00)
GFR, Estimated: >60 mL/min (ref >60)

## 2023-03-26 LAB — HEMOGLOBIN: Hemoglobin: 5.3 g/dL — CL (ref 12.0–15.0)

## 2023-03-26 MED ORDER — POTASSIUM CHLORIDE 10 MEQ/100ML IV SOLN: 10.0000 meq | INTRAVENOUS | Status: DC

## 2023-03-26 MED ORDER — MAGNESIUM SULFATE 2 GM/50ML IV SOLN
2.0000 g | Freq: Once | INTRAVENOUS | Status: AC
  Administered 2023-03-26: 2 g via INTRAVENOUS
  Filled 2023-03-26: qty 50

## 2023-03-26 MED ORDER — POTASSIUM CHLORIDE 20 MEQ PO PACK: 40.0000 meq | PACK | Freq: Once | ORAL | Status: DC

## 2023-03-26 MED ORDER — POTASSIUM CHLORIDE IN NACL 20-0.9 MEQ/L-% IV SOLN: INTRAVENOUS | Status: DC

## 2023-03-26 MED ORDER — FERROUS SULFATE 325 (65 FE) MG PO TABS
325.0000 mg | ORAL_TABLET | Freq: Two times a day (BID) | ORAL | Status: DC
  Administered 2023-03-26 – 2023-03-27 (×2): 325 mg via ORAL
  Filled 2023-03-26 (×2): qty 1

## 2023-03-26 MED ORDER — POTASSIUM CHLORIDE 20 MEQ PO PACK
60.0000 meq | PACK | Freq: Once | ORAL | Status: AC
  Administered 2023-03-26: 60 meq via ORAL
  Filled 2023-03-26: qty 3

## 2023-03-26 NOTE — Progress Notes (Deleted)
toc

## 2023-03-26 NOTE — Progress Notes (Deleted)
OT Cancellation Note  Patient Details Name: [name] MRN: [id number] DOB: Jun 18, 1971   Cancelled Treatment:    Reason Eval/Treat Not Completed: Medical issues which prohibited therapy Patient remains with low hgb of 5.3 on this date. OT to continue to follow and check back as schedule will allow.  Rosalio Loud, MS Acute Rehabilitation Department Office# [phone]  03/26/2023, 7:34 AM

## 2023-03-26 NOTE — Progress Notes (Signed)
Verbal order by MD Carolynne Edouard for 3 runs of potassium and continuous IV fluid, Normal saline with 20k at 45mls/hr.

## 2023-03-26 NOTE — Evaluation (Signed)
Physical Therapy Evaluation Patient Details Name: [name] MRN: [id number] DOB: 1970/12/18 Today's Date: 03/26/2023  History of Present Illness  Patient is a 52 year old female who presented with chronic iron deficiency anemia with cancer of hepatic flexure of colon. Patient underwent robotic proximal right colectomy with intracorporeal anastomosis transversus abdominis plane block. PMH: schizoaffective disorder, DM II, bilateral deafness, OSA, blood transfusion declined, endometriosis, colon cancer  Clinical Impression  Pt admitted with above diagnosis.  Pt currently with functional limitations due to the deficits listed below (see PT Problem List). Pt will benefit from acute skilled PT to increase their independence and safety with mobility to allow discharge.     The patient has been ambulating, using Rw due to HGB being low. Patient  plans to stay with  sister at DC. Patient  should progress well.      Recommendations for follow up therapy are one component of a multi-disciplinary discharge planning process, led by the attending physician.  Recommendations may be updated based on patient status, additional functional criteria and insurance authorization.  Follow Up Recommendations       Assistance Recommended at Discharge Intermittent Supervision/Assistance  Patient can return home with the following  A little help with walking and/or transfers;A little help with bathing/dressing/bathroom;Assistance with cooking/housework;Assist for transportation;Help with stairs or ramp for entrance    Equipment Recommendations Rolling walker (2 wheels)  Recommendations for Other Services       Functional Status Assessment Patient has had a recent decline in their functional status and demonstrates the ability to make significant improvements in function in a reasonable and predictable amount of time.     Precautions / Restrictions Precautions Precautions: Fall Precaution Comments: low  hgb Restrictions Weight Bearing Restrictions: No      Mobility  Bed Mobility   Bed Mobility: Supine to Sit     Supine to sit: Supervision     General bed mobility comments: with HOB raised    Transfers Overall transfer level: Needs assistance Equipment used: Rolling walker (2 wheels) Transfers: Sit to/from Stand Sit to Stand: Supervision                Ambulation/Gait Ambulation/Gait assistance: Supervision Gait Distance (Feet): 100 Feet Assistive device: Rolling walker (2 wheels)   Gait velocity: decr     General Gait Details: then 54' without, light HHA  Stairs            Wheelchair Mobility    Modified Rankin (Stroke Patients Only)       Balance Overall balance assessment: Mild deficits observed, not formally tested                                           Pertinent Vitals/Pain Pain Assessment Faces Pain Scale: Hurts a little bit Pain Location: abdominal discomfort Pain Descriptors / Indicators: Discomfort Pain Intervention(s): Monitored during session    Home Living Family/patient expects to be discharged to:: Private residence Living Arrangements: Children;Other relatives Available Help at Discharge: Family;Available 24 hours/day Type of Home: House Home Access: Stairs to enter   [employer] of Steps: 4   Home Layout: One level Home Equipment: None Additional Comments: patient lives in an apartment with son. patients sister present in room reporting that patient is going to live with her. layout above reflects sisters house    Prior Function Prior Level of Function : Independent/Modified Independent  Hand Dominance   Dominant Hand: Left    Extremity/Trunk Assessment   Upper Extremity Assessment Upper Extremity Assessment: Overall WFL for tasks assessed    Lower Extremity Assessment Lower Extremity Assessment: Overall WFL for tasks assessed    Cervical / Trunk  Assessment Cervical / Trunk Assessment: Normal  Communication   Communication: HOH;Deaf (L ear is better than R)  Cognition   Behavior During Therapy: WFL for tasks assessed/performed Overall Cognitive Status: Within Functional Limits for tasks assessed                                 General Comments: patient is plesant and cooperative., HOH        General Comments      Exercises     Assessment/Plan    PT Assessment Patient needs continued PT services  PT Problem List Decreased strength;Decreased mobility;Decreased safety awareness;Decreased knowledge of precautions;Decreased activity tolerance       PT Treatment Interventions DME instruction;Therapeutic activities;Gait training;Therapeutic exercise;Patient/family education;Functional mobility training    PT Goals (Current goals can be found in the Care Plan section)  Acute Rehab PT Goals Patient Stated Goal: go home PT Goal Formulation: With patient Time For Goal Achievement: 04/09/23 Potential to Achieve Goals: Good    Frequency Min 1X/week     Co-evaluation               AM-PAC PT "6 Clicks" Mobility  Outcome Measure Help needed turning from your back to your side while in a flat bed without using bedrails?: None Help needed moving from lying on your back to sitting on the side of a flat bed without using bedrails?: None Help needed moving to and from a bed to a chair (including a wheelchair)?: A Little Help needed standing up from a chair using your arms (e.g., wheelchair or bedside chair)?: A Little Help needed to walk in hospital room?: A Little Help needed climbing 3-5 steps with a railing? : A Lot 6 Click Score: 19    End of Session   Activity Tolerance: Patient tolerated treatment well Patient left: in chair;with call bell/phone within reach;with chair alarm set;with family/visitor present Nurse Communication: Mobility status PT Visit Diagnosis: Unsteadiness on feet (R26.81)     Time: 1610-9604 PT Time Calculation (min) (ACUTE ONLY): 13 min   Charges:   PT Evaluation $PT Eval Low Complexity: 1 Low          Blanchard Kelch PT Acute Rehabilitation Services Office [phone] Weekend [phone]   Rada Hay 03/26/2023, 4:46 PM

## 2023-03-26 NOTE — Evaluation (Signed)
Occupational Therapy Evaluation Patient Details Name: [name] MRN: [id number] DOB: 1970-12-16 Today's Date: 03/26/2023   History of Present Illness Patient is a 52 year old female who presented with chronic iron deficiency anemia with cancer of hepatic flexure of colon. Patient underwent robotic proximal right colectomy with intracorporeal anastomosis transversus abdominis plane block. PMH: schizoaffective disorder, DM II, bilateral deafness, OSA, blood transfusion declined, endometriosis, colon cancer   Clinical Impression   Patient is a 52 year old female who was admitted for above. Patient was noted to have low hgb with weakness and pain secondary to surgery impacting engagement in ADLs. Patient is supervision with RW with min guard without RW with increased time and drifting to L side. Patient would benefit from continued skilled OT services for AE education for increased independence in ADL tasks.       Recommendations for follow up therapy are one component of a multi-disciplinary discharge planning process, led by the attending physician.  Recommendations may be updated based on patient status, additional functional criteria and insurance authorization.   Assistance Recommended at Discharge Intermittent Supervision/Assistance  Patient can return home with the following Assistance with cooking/housework;Direct supervision/assist for medications management;Assist for transportation;Help with stairs or ramp for entrance;A little help with bathing/dressing/bathroom    Functional Status Assessment  Patient has had a recent decline in their functional status and demonstrates the ability to make significant improvements in function in a reasonable and predictable amount of time.  Equipment Recommendations  Other (comment) (reacher)       Precautions / Restrictions Precautions Precautions: Fall Precaution Comments: low hgb Restrictions Weight Bearing Restrictions: No       Mobility Bed Mobility Overal bed mobility: Needs Assistance Bed Mobility: Supine to Sit     Supine to sit: Supervision     General bed mobility comments: with HOB raised               Balance Overall balance assessment: Mild deficits observed, not formally tested               ADL either performed or assessed with clinical judgement   ADL Overall ADL's : Needs assistance/impaired Eating/Feeding: Modified independent;Sitting   Grooming: Modified independent;Sitting   Upper Body Bathing: Min guard;Sitting   Lower Body Bathing: Minimal assistance;Sitting/lateral leans;Sit to/from stand Lower Body Bathing Details (indicate cue type and reason): unable to complete figure four positioning sitting in recliner. patient reporting bending puts pressure on abdomen. Upper Body Dressing : Min guard;Sitting   Lower Body Dressing: Moderate assistance;Sitting/lateral leans;Sit to/from stand Lower Body Dressing Details (indicate cue type and reason): patient was able to doff sock but reported pressure on abdomen. patient was educated on AE with plan to train patient tomorrow. patient verbalized understanding. Toilet Transfer: Min guard;Ambulation;Rolling walker (2 wheels) Toilet Transfer Details (indicate cue type and reason): patient is supervision with RW and min guard with HHA for no AD. patient with low hgb. patient reported legs feeling stiff but loosen up with movement. Toileting- Clothing Manipulation and Hygiene: Moderate assistance;Sit to/from stand               Vision   Vision Assessment?: No apparent visual deficits Additional Comments: was noted to have a drift to the L side in the hallway with patient not aware of it.            Pertinent Vitals/Pain Pain Assessment Pain Assessment: Faces Faces Pain Scale: Hurts a little bit Pain Location: abdominal discomfort Pain Descriptors / Indicators: Discomfort Pain  Intervention(s): Limited activity within  patient's tolerance, Monitored during session     Hand Dominance Left   Extremity/Trunk Assessment Upper Extremity Assessment Upper Extremity Assessment: Overall WFL for tasks assessed   Lower Extremity Assessment Lower Extremity Assessment: Defer to PT evaluation   Cervical / Trunk Assessment Cervical / Trunk Assessment: Normal   Communication Communication Communication: HOH;Deaf (L ear is better than R)   Cognition Arousal/Alertness: Awake/alert Behavior During Therapy: WFL for tasks assessed/performed Overall Cognitive Status: Within Functional Limits for tasks assessed       General Comments: patient is plesant and cooperative.                Home Living Family/patient expects to be discharged to:: Private residence Living Arrangements: Alone Available Help at Discharge: Family;Available 24 hours/day Type of Home: House Home Access: Stairs to enter [employer] of Steps: 4   Home Layout: One level     Bathroom Shower/Tub: Chief Strategy Officer: Standard     Home Equipment: None   Additional Comments: patient lives in an apartment with son. patients sister present in room reporting that patient is going to live with her. layout above reflects sisters house      Prior Functioning/Environment Prior Level of Function : Independent/Modified Independent                        OT Problem List: Decreased activity tolerance;Impaired balance (sitting and/or standing);Decreased coordination;Decreased safety awareness;Decreased knowledge of precautions;Decreased knowledge of use of DME or AE      OT Treatment/Interventions: Self-care/ADL training;Therapeutic activities;Patient/family education    OT Goals(Current goals can be found in the care plan section) Acute Rehab OT Goals Patient Stated Goal: to get better OT Goal Formulation: With patient Time For Goal Achievement: 04/09/23 Potential to Achieve Goals: Fair  OT  Frequency: Min 1X/week       AM-PAC OT "6 Clicks" Daily Activity     Outcome Measure Help from another person eating meals?: None Help from another person taking care of personal grooming?: None Help from another person toileting, which includes using toliet, bedpan, or urinal?: A Little Help from another person bathing (including washing, rinsing, drying)?: A Little Help from another person to put on and taking off regular upper body clothing?: A Little Help from another person to put on and taking off regular lower body clothing?: A Little 6 Click Score: 20   End of Session Equipment Utilized During Treatment: Rolling walker (2 wheels) Nurse Communication: Mobility status  Activity Tolerance: Patient tolerated treatment well Patient left: in chair;with call bell/phone within reach;with chair alarm set;with family/visitor present  OT Visit Diagnosis: Unsteadiness on feet (R26.81);Other abnormalities of gait and mobility (R26.89);Pain                Time: 1610-9604 OT Time Calculation (min): 24 min Charges:  OT General Charges $OT Visit: 1 Visit OT Evaluation $OT Eval Low Complexity: 1 Low  Rafiel Mecca OTR/L, MS Acute Rehabilitation Department Office# [phone]   Selinda Flavin 03/26/2023, 3:40 PM

## 2023-03-26 NOTE — Progress Notes (Signed)
MD Carolynne Edouard was paged about patient's hemoglobin being 5.3 and potassium is 2.9. Lab didn't notify of critical values

## 2023-03-26 NOTE — Progress Notes (Signed)
2 Days Post-Op   Subjective/Chief Complaint: Pt without c/o this am No n/v Had several BMs Low potassium this am Pt denies dizziness/lightheadedness    Objective: Vital signs in last 24 hours: Temp:  [97.7 F (36.5 C)-99 F (37.2 C)] 99 F (37.2 C) (05/11 0506) Pulse Rate:  [87-100] 95 (05/11 0506) Resp:  [18] 18 (05/11 0506) BP: (94-117)/(59-69) 117/69 (05/11 0506) SpO2:  [96 %-100 %] 96 % (05/11 0506) Last BM Date : 03/25/23  Intake/Output from previous day: 05/10 0701 - 05/11 0700 In: 1140 [P.O.:1140] Out: 2400 [Urine:2400] Intake/Output this shift: Total I/O In: 120 [P.O.:120] Out: 200 [Urine:200]  Awake, alert, nontoxic Symm rise, nonlabored Soft, obese, min TTP, incisions ok No edema  Lab Results:  Recent Labs    03/25/23 0504 03/26/23 0438  WBC 7.1  --   HGB 5.9* 5.3*  HCT 19.6*  --   PLT 294  --    BMET Recent Labs    03/25/23 0504 03/26/23 0438  NA 135  --   K 3.5 2.9*  CL 101  --   CO2 25  --   GLUCOSE 197*  --   BUN <5*  --   CREATININE 0.75 0.64  CALCIUM 7.9*  --    PT/INR No results for input(s): "LABPROT", "INR" in the last 72 hours. ABG No results for input(s): "PHART", "HCO3" in the last 72 hours.  Invalid input(s): "PCO2", "PO2"  Studies/Results: No results found.  Anti-infectives: Anti-infectives (From admission, onward)    Start     Dose/Rate Route Frequency Ordered Stop   03/24/23 2000  cefoTEtan (CEFOTAN) 2 g in sodium chloride 0.9 % 100 mL IVPB        2 g 200 mL/hr over 30 Minutes Intravenous Every 12 hours 03/24/23 1354 03/24/23 2110   03/24/23 1400  neomycin (MYCIFRADIN) tablet 1,000 mg  Status:  Discontinued       See Hyperspace for full Linked Orders Report.   1,000 mg Oral 3 times per day 03/24/23 0540 03/24/23 0551   03/24/23 1400  metroNIDAZOLE (FLAGYL) tablet 1,000 mg  Status:  Discontinued       See Hyperspace for full Linked Orders Report.   1,000 mg Oral 3 times per day 03/24/23 0540 03/24/23 0551    03/24/23 0600  cefoTEtan (CEFOTAN) 2 g in sodium chloride 0.9 % 100 mL IVPB        2 g 200 mL/hr over 30 Minutes Intravenous On call to O.R. 03/24/23 0540 03/24/23 0826       Assessment/Plan: Principal Problem:   Malignant neoplasm of hepatic flexure (HCC) Active Problems:   Schizoaffective disorder, chronic condition (HCC)   Bilateral deafness   Diabetes mellitus type 2, noninsulin dependent (HCC)   Blood transfusion declined because patient is Jehovah's Witness   OSA (obstructive sleep apnea)   Pelvic mass in female   Cyst of left ovary   History of endometriosis   Colon cancer (HCC)  s/p Procedure(s): ROBOTIC RIGHT COLECTOMY PROXIMAL (N/A) XI ROBOTIC ASSISTED  BILATERAL  OOPHORECTOMY, LYSIS OF ADHESIONS (Bilateral) Dr Michaell Cowing  Acute blood loss anemia on chronic anemia - hgb 9.6 preop, -->5.9-->5.3, repeat in am, po iron bid Jehovah's witness - refuses blood Hold chemical vte prophylaxis Scds only Hypokalemia - replace with oral potassium Hypomagnesia 03/25/23- mag was not replaced yesterday so will give iv mag sulfate  Tolerating diet so will keep IVF KVO to minimize dilution  DM 2- bs ok, cont current regimen  LOS: 2 days  Gaynelle Adu 03/26/2023

## 2023-03-26 NOTE — Progress Notes (Signed)
Patient ID: [name], female   DOB: Dec 28, 1970, 52 y.o.   MRN: [id number] The patient denies any complaints.  Gen Surg notes appreciated.  She is progressing.     Antionette Char, MD GYN-ONC

## 2023-03-27 LAB — MAGNESIUM: Magnesium: 1.4 mg/dL — ABNORMAL LOW (ref 1.7–2.4)

## 2023-03-27 LAB — BASIC METABOLIC PANEL
Anion gap: 9 (ref 5–15)
BUN: <5 mg/dL — ABNORMAL LOW (ref 6–20)
CO2: 25 mmol/L (ref 22–32)
Calcium: 8.4 mg/dL — ABNORMAL LOW (ref 8.9–10.3)
Chloride: 103 mmol/L (ref 98–111)
Creatinine, Ser: 0.6 mg/dL (ref 0.44–1.00)
GFR, Estimated: >60 mL/min (ref >60)
Glucose, Bld: 125 mg/dL — ABNORMAL HIGH (ref 70–99)
Potassium: 3.5 mmol/L (ref 3.5–5.1)
Sodium: 137 mmol/L (ref 135–145)

## 2023-03-27 LAB — HEMOGLOBIN: Hemoglobin: 5.3 g/dL — CL (ref 12.0–15.0)

## 2023-03-27 LAB — GLUCOSE, CAPILLARY: Glucose-Capillary: 129 mg/dL — ABNORMAL HIGH (ref 70–99)

## 2023-03-27 MED ORDER — MAGNESIUM SULFATE 2 GM/50ML IV SOLN
2.0000 g | Freq: Once | INTRAVENOUS | Status: AC
  Administered 2023-03-27: 2 g via INTRAVENOUS
  Filled 2023-03-27: qty 50

## 2023-03-27 NOTE — Progress Notes (Signed)
Patient ID: [name], female   DOB: 1971/08/27, 52 y.o.   MRN: [id number] The patient continues to deny any complaints.  Gen Surg notes appreciated.  Plan is for discharge today.  Will follow up in the office.       Antionette Char, MD GYN-ONC

## 2023-03-27 NOTE — Progress Notes (Addendum)
3 Days Post-Op   Subjective/Chief Complaint: Pt without c/o this am except some old blood on lower dressing No n/v Had several BMs Low mag this am Pt denies dizziness/lightheadedness    Objective: Vital signs in last 24 hours: Temp:  [97.8 F (36.6 C)-99 F (37.2 C)] 98.1 F (36.7 C) (05/12 0534) Pulse Rate:  [89-100] 100 (05/12 0534) Resp:  [18] 18 (05/11 1342) BP: (104-133)/(66-80) 133/80 (05/12 0534) SpO2:  [98 %-99 %] 98 % (05/12 0534) Weight:  [100.1 kg] 100.1 kg (05/12 0500) Last BM Date : 03/27/23  Intake/Output from previous day: 05/11 0701 - 05/12 0700 In: 710 [P.O.:710] Out: 1900 [Urine:1900] Intake/Output this shift: No intake/output data recorded.  Awake, alert, nontoxic Symm rise, nonlabored Soft, obese, min TTP, incisions ok; a little bit bloody drainage on extraction dressing No edema  Lab Results:  Recent Labs    03/25/23 0504 03/26/23 0438 03/27/23 0421  WBC 7.1  --   --   HGB 5.9* 5.3* 5.3*  HCT 19.6*  --   --   PLT 294  --   --     BMET Recent Labs    03/25/23 0504 03/26/23 0438 03/27/23 0421  NA 135  --  137  K 3.5 2.9* 3.5  CL 101  --  103  CO2 25  --  25  GLUCOSE 197*  --  125*  BUN <5*  --  <5*  CREATININE 0.75 0.64 0.60  CALCIUM 7.9*  --  8.4*    PT/INR No results for input(s): "LABPROT", "INR" in the last 72 hours. ABG No results for input(s): "PHART", "HCO3" in the last 72 hours.  Invalid input(s): "PCO2", "PO2"  Studies/Results: No results found.  Anti-infectives: Anti-infectives (From admission, onward)    Start     Dose/Rate Route Frequency Ordered Stop   03/24/23 2000  cefoTEtan (CEFOTAN) 2 g in sodium chloride 0.9 % 100 mL IVPB        2 g 200 mL/hr over 30 Minutes Intravenous Every 12 hours 03/24/23 1354 03/24/23 2110   03/24/23 1400  neomycin (MYCIFRADIN) tablet 1,000 mg  Status:  Discontinued       See Hyperspace for full Linked Orders Report.   1,000 mg Oral 3 times per day 03/24/23 0540 03/24/23 0551    03/24/23 1400  metroNIDAZOLE (FLAGYL) tablet 1,000 mg  Status:  Discontinued       See Hyperspace for full Linked Orders Report.   1,000 mg Oral 3 times per day 03/24/23 0540 03/24/23 0551   03/24/23 0600  cefoTEtan (CEFOTAN) 2 g in sodium chloride 0.9 % 100 mL IVPB        2 g 200 mL/hr over 30 Minutes Intravenous On call to O.R. 03/24/23 0540 03/24/23 0826       Assessment/Plan: Principal Problem:   Malignant neoplasm of hepatic flexure (HCC) Active Problems:   Schizoaffective disorder, chronic condition (HCC)   Bilateral deafness   Diabetes mellitus type 2, noninsulin dependent (HCC)   Blood transfusion declined because patient is Jehovah's Witness   OSA (obstructive sleep apnea)   Pelvic mass in female   Cyst of left ovary   History of endometriosis   Colon cancer (HCC)  s/p Procedure(s): ROBOTIC RIGHT COLECTOMY PROXIMAL (N/A) XI ROBOTIC ASSISTED  BILATERAL  OOPHORECTOMY, LYSIS OF ADHESIONS (Bilateral) Dr Michaell Cowing  Acute blood loss anemia on chronic anemia - hgb 9.6 preop, -->5.9-->5.3-->5.3, pt received IV iron 5/10, po iron bid Jehovah's witness - refuses blood Hold chemical vte prophylaxis  Scds only Hypokalemia - resolved Hypomagnesia 03/26/23-will give iv mag sulfate  Tolerating diet so will keep IVF KVO to minimize dilution  DM 2- bs ok, cont current regimen  Appears stable for dc. Tolerating a diet. Having BMs. No lightheadedness with ambulation. No hypotension. Hgb low but stable.  Updated son & sister via phone.    LOS: 3 days    [name] 03/27/2023

## 2023-03-27 NOTE — Progress Notes (Signed)
SDOH risk; pt d/c'd before TOC assessment completed.

## 2023-03-28 ENCOUNTER — Telehealth: Payer: Self-pay | Admitting: *Deleted

## 2023-03-28 ENCOUNTER — Telehealth: Payer: Self-pay

## 2023-03-28 NOTE — Transitions of Care (Post Inpatient/ED Visit) (Unsigned)
   03/28/2023  Name: [name] MRN: [id number] DOB: [birth year]  Today's TOC FU Call Status: Today's TOC FU Call Status:: Unsuccessul Call (1st Attempt) Unsuccessful Call (1st Attempt) Date: 03/28/23  Attempted to reach the patient regarding the most recent Inpatient/ED visit.  Follow Up Plan: Additional outreach attempts will be made to reach the patient to complete the Transitions of Care (Post Inpatient/ED visit) call.   Signature Karena Addison, LPN [facility] Nurse Health Advisor Direct Dial [phone]

## 2023-03-28 NOTE — Telephone Encounter (Signed)
Attempted to reach patient for follow up call. Pt's son answered the phone, left message to have patient call the office at [phone].

## 2023-03-28 NOTE — Telephone Encounter (Signed)
Spoke with [name] this afternoon after leaving a message with her son to call the office back. She states she is eating, drinking and urinating well. She has had 4 BM's and is passing gas. She was encouraged to continue to drink plenty of water. She denies fever or chills. Incisions are dry and intact. She rates her pain 5/10. Her pain is controlled with ultram and tylenol.     Instructed to call office with any fever, chills, purulent drainage, uncontrolled pain or any other questions or concerns. Patient verbalizes understanding.   Pt aware of post op appointments as well as the office number [phone] and after hours number [phone] to call if she has any questions or concerns

## 2023-03-29 NOTE — Transitions of Care (Post Inpatient/ED Visit) (Signed)
   03/29/2023  Name: [name] MRN: [id number] DOB: Oct 05, 1971  Today's TOC FU Call Status: Today's TOC FU Call Status:: Unsuccessful Call (2nd Attempt) Unsuccessful Call (1st Attempt) Date: 03/28/23 Unsuccessful Call (2nd Attempt) Date: 03/29/23  Attempted to reach the patient regarding the most recent Inpatient/ED visit.  Follow Up Plan: Additional outreach attempts will be made to reach the patient to complete the Transitions of Care (Post Inpatient/ED visit) call.   Signature Karena Addison, LPN [facility] Nurse Health Advisor Direct Dial [phone]

## 2023-03-29 NOTE — Transitions of Care (Post Inpatient/ED Visit) (Signed)
03/29/2023  Name: [name] MRN: [id number] DOB: 10/31/1971  Today's TOC FU Call Status: Today's TOC FU Call Status:: Successful TOC FU Call Competed Unsuccessful Call (1st Attempt) Date: 03/28/23 Unsuccessful Call (2nd Attempt) Date: 03/29/23 [facility] FU Call Complete Date: 03/29/23  Transition Care Management Follow-up Telephone Call Date of Discharge: 03/27/23 Discharge Facility: [facility]) Type of Discharge: Inpatient Admission Primary Inpatient Discharge Diagnosis:: neoplasm of hepatic flexure How have you been since you were released from the hospital?: Better Any questions or concerns?: No  Items Reviewed: Did you receive and understand the discharge instructions provided?: Yes Medications obtained,verified, and reconciled?: Yes (Medications Reviewed) Any new allergies since your discharge?: No Dietary orders reviewed?: Yes Do you have support at home?: Yes People in Home: sibling(s)  Medications Reviewed Today: Medications Reviewed Today     Reviewed by Karena Addison, LPN (Licensed Practical Nurse) on 03/29/23 at 1522  Med List Status: <None>   Medication Order Taking? Sig Documenting Provider Last Dose Status Informant  acetaminophen (TYLENOL) 500 MG tablet [id number] Yes Take 1,000 mg by mouth every 6 (six) hours as needed for moderate pain. [provider] Taking Active Self  amLODipine (NORVASC) 10 MG tablet [id number] Yes Take 1 tablet (10 mg total) by mouth daily. Marrianne Mood, MD Taking Active Self  atorvastatin (LIPITOR) 40 MG tablet [id number] Yes TAKE 1 TABLET BY MOUTH DAILY Marrianne Mood, MD Taking Active Self  benzonatate (TESSALON PERLES) 100 MG capsule [id number] Yes Take 1 capsule (100 mg total) by mouth 3 (three) times daily as needed for cough.  Patient taking differently: Take 100 mg by mouth daily as needed for cough.   Mapp, Gaylyn Cheers, MD Taking Active Self  carvedilol (COREG) 6.25 MG tablet [id number] Yes TAKE 1 TABLET BY MOUTH  TWICE A DAY WITH MEALS Marrianne Mood, MD Taking Active Self  diphenhydrAMINE (BENADRYL) 50 MG tablet [id number] Yes Take 50 mg of Benadryl 1 hour prior to your CT scan Malachy Mood, MD Taking Active Self  DULoxetine (CYMBALTA) 30 MG capsule [id number] Yes Take 1 capsule (30 mg total) by mouth daily. Marrianne Mood, MD Taking Active Self  ferrous sulfate 325 (65 FE) MG EC tablet [id number] Yes Take 1 tablet (325 mg total) by mouth every other day. Marrianne Mood, MD Taking Active Self  fluticasone Aleda Grana) 50 MCG/ACT nasal spray [id number] Yes Place 2 sprays into both nostrils daily as needed for allergies or rhinitis. [provider] Taking Active Self  guaiFENesin (MUCINEX) 600 MG 12 hr tablet [id number] Yes Take 1 tablet (600 mg total) by mouth 2 (two) times daily. Mapp, Gaylyn Cheers, MD Taking Active Self  Insulin Pen Needle (BD PEN NEEDLE NANO U/F) 32G X 4 MM MISC [id number] Yes USE TO INJECT VICTOZA ONCE DAILY Marrianne Mood, MD Taking Active Self  liraglutide (VICTOZA) 18 MG/3ML SOPN [id number] Yes Inject 1.8 mg into the skin daily. Marrianne Mood, MD Taking Active Self  metFORMIN (GLUCOPHAGE) 1000 MG tablet [id number] Yes Take 1 tablet (1,000 mg total) by mouth 2 (two) times daily. Marrianne Mood, MD Taking Active Self  Multiple Vitamins-Minerals [facility] SKIN & NAILS) TABS [id number] Yes Take 1 tablet by mouth daily. [provider] Taking Active Self  omeprazole (PRILOSEC) 40 MG capsule [id number] Yes Take 1 capsule (40 mg total) by mouth daily. Benancio Deeds, MD Taking Active Self  Paliperidone ER [facility] SUSTENNA) injection [id number] Yes Inject 117 mg into the muscle every 30 (thirty) days. [provider] Taking Active Self  traMADol (ULTRAM) 50 MG tablet [id number]  Yes Take 1-2 tablets (50-100 mg total) by mouth every 6 (six) hours as needed for moderate pain. Karie Soda, MD Taking Active   Med List Note Art Buff, CPhT 06/17/16 2234): [name]  [name](son) [phone]            Home Care and Equipment/Supplies: Were Home Health Services Ordered?: NA Any new equipment or medical supplies ordered?: NA  Functional Questionnaire: Do you need assistance with bathing/showering or dressing?: No Do you need assistance with meal preparation?: No Do you need assistance with eating?: No Do you have difficulty maintaining continence: No Do you need assistance with getting out of bed/getting out of a chair/moving?: No Do you have difficulty managing or taking your medications?: No  Follow up appointments reviewed: PCP Follow-up appointment confirmed?: NA Specialist Hospital Follow-up appointment confirmed?: Yes Date of Specialist follow-up appointment?: 04/18/23 Follow-Up Specialty Provider:: Dr Michaell Cowing Do you need transportation to your follow-up appointment?: No Do you understand care options if your condition(s) worsen?: Yes-patient verbalized understanding    SIGNATURE Karena Addison, LPN [facility] Nurse Health Advisor Direct Dial [phone]

## 2023-03-30 NOTE — Discharge Summary (Signed)
Physician Discharge Summary    Patient ID: [name] MRN: [id number] DOB/AGE: [birth year]  52 y.o.  Patient Care Team: Marrianne Mood, MD as PCP - General Hanley Seamen, Dustin Folks, MD as Referring Physician (Optometry) Malachy Mood, MD as Consulting Physician (Oncology) Karie Soda, MD as Consulting Physician (General Surgery) Armbruster, Willaim Rayas, MD as Consulting Physician (Gastroenterology) Carver Fila, MD as Consulting Physician (Gynecologic Oncology)  Admit date: 03/24/2023  Discharge date: 03/27/2023 Hospital Stay = 3 days    Discharge Diagnoses:  Principal Problem:   Malignant neoplasm of hepatic flexure [facility]) Active Problems:   Schizoaffective disorder, chronic condition (HCC)   Bilateral deafness   Blood transfusion declined because patient is Jehovah's Witness   Diabetes mellitus type 2, noninsulin dependent (HCC)   OSA (obstructive sleep apnea)   Pelvic mass in female   Cysts of both ovaries   History of endometriosis   Colon cancer (HCC)    03/24/2023  POST-OPERATIVE DIAGNOSIS:  CANCER OF HEPATIC FLEXURE OF COLON   PROCEDURE:   ROBOTIC PROXIMAL RIGHT COLECTOMY WITH INTRACORPOREAL ANASTOMOSIS TRANSVERSUS ABDOMINIS PLANE (TAP) BLOCK - BILATERAL   SURGEON:  Ardeth Sportsman, MD   OR FINDINGS:    Patient had tattooing at hepatic flexure consistent with suspected mass seen by colonoscopy and CT scan of adenocarcinoma.  No obvious metastatic disease on visceral parietal peritoneum or liver.   It is an  isoperistaltic ileocolonic (distal ileum to mid transverse colon ) anastomosis that rests in the periumbilical region.   Patient with mildly enlarged atrophic right ovary excised.  Patient with more bulky complex cystic mass involving the left ovary.  Bilateral oophorectomy done by Dr. Pricilla Holm at beginning of case.  Please see her separate note.   ##########################    OPERATIVE NOTE  Pre-operative Diagnosis: Adnexal mass, history of  endometriosis   Post-operative Diagnosis: same, pelvic adhesions   Operation:  Robotic-assisted laparoscopic bilateral oophorectomy lysis of adhesions for approximately 25 minutes   Surgeon: Eugene Garnet MD         Consults: Case Management / Social Work, Physical Therapy, Anesthesia, and Gynecologic Oncology  Hospital Course:   The patient underwent the surgery above.  Postoperatively, the patient gradually mobilized and advanced to a solid diet.  Pain and other symptoms were treated aggressively.  Patient came in with significant iron deficiency anemia.  Jehovah's Witness refusing transfusions.  IV iron given and oral iron restarted.  Postoperative hemoglobin in the mid 5s but asymptomatic.  Chemical VTE prophylaxis held.  Hemoglobin stable an additional 48 hours.  Therefore felt to be able and stable to be discharged home.  By the time of discharge, the patient was walking well the hallways, eating food, having flatus.  Pain was well-controlled on an oral medications.  Based on meeting discharge criteria and continuing to recover, I felt it was safe for the patient to be discharged from the hospital to further recover with close followup. Postoperative recommendations were discussed in detail.  They are written as well.  Discharged Condition: good  Discharge Exam: Blood pressure 133/80, pulse 100, temperature 98.1 F (36.7 C), temperature source Oral, resp. rate 18, height 5\' 4"  (1.626 m), weight 100.1 kg, last menstrual period 07/23/2015, SpO2 98 %.  General: Pt awake/alert/oriented x4 in No acute distress Eyes: PERRL, normal EOM.  Sclera clear.  No icterus Neuro: CN II-XII intact w/o focal sensory/motor deficits. Lymph: No head/neck/groin lymphadenopathy Psych:  No delerium/psychosis/paranoia HENT: Normocephalic, Mucus membranes moist.  No thrush.  Mildly hard of hearing. Neck:  Supple, No tracheal deviation Chest:  No chest wall pain w good excursion CV:  Pulses intact.   Regular rhythm MS: Normal AROM mjr joints.  No obvious deformity Abdomen: Soft.  Nondistended.  Mildly tender at incisions only.  No evidence of peritonitis.  No incarcerated hernias. Ext:  SCDs BLE.  No mjr edema.  No cyanosis Skin: No petechiae / purpura   Disposition:    Follow-up Information     Karie Soda, MD Follow up on 04/18/2023.   Specialties: General Surgery, Colon and Rectal Surgery Why: To follow up after your operation Contact information: [address] [place] [address] [phone]                 Discharge disposition: 01-Home or Self Care       Discharge Instructions     Call MD for:   Complete by: As directed    FEVER > 101.5 F  (temperatures < 101.5 F are not significant)   Call MD for:  extreme fatigue   Complete by: As directed    Call MD for:  persistant dizziness or light-headedness   Complete by: As directed    Call MD for:  persistant nausea and vomiting   Complete by: As directed    Call MD for:  redness, tenderness, or signs of infection (pain, swelling, redness, odor or green/yellow discharge around incision site)   Complete by: As directed    Call MD for:  severe uncontrolled pain   Complete by: As directed    Diet - low sodium heart healthy   Complete by: As directed    Start with a bland diet such as soups, liquids, starchy foods, low fat foods, etc. the first few days at home. Gradually advance to a solid, low-fat, high fiber diet by the end of the first week at home.   Add a fiber supplement to your diet (Metamucil, etc) If you feel full, bloated, or constipated, stay on a full liquid or pureed/blenderized diet for a few days until you feel better and are no longer constipated.   Discharge instructions   Complete by: As directed    See Discharge Instructions If you are not getting better after two weeks or are noticing you are getting worse, contact our office [phone] for further advice.  We may  need to adjust your medications, re-evaluate you in the office, send you to the emergency room, or see what other things we can do to help. The clinic staff is available to answer your questions during regular business hours (8:30am-5pm).  Please don't hesitate to call and ask to speak to one of our nurses for clinical concerns.    A surgeon from [facility] is always on call at the hospitals 24 hours/day If you have a medical emergency, go to the nearest emergency room or call 911.   Discharge patient   Complete by: As directed    Discharge disposition: 01-Home or Self Care   Discharge patient date: 03/27/2023   Discharge wound care:   Complete by: As directed    It is good for closed incisions and even open wounds to be washed every day.  Shower every day.  Short baths are fine.  Wash the incisions and wounds clean with soap & water.    You may leave closed incisions open to air if it is dry.   You may cover the incision with clean gauze & replace it after your daily shower  for comfort.  TEGADERM:  You have clear gauze band-aid dressings over your closed incision(s).  Remove the dressings 3 days after surgery = Monday 5/13   Driving Restrictions   Complete by: As directed    You may drive when: - you are no longer taking narcotic prescription pain medication - you can comfortably wear a seatbelt - you can safely make sudden turns/stops without pain.   Increase activity slowly   Complete by: As directed    Start light daily activities --- self-care, walking, climbing stairs- beginning the day after surgery.  Gradually increase activities as tolerated.  Control your pain to be active.  Stop when you are tired.  Ideally, walk several times a day, eventually an hour a day.   Most people are back to most day-to-day activities in a few weeks.  It takes 4-6 weeks to get back to unrestricted, intense activity. If you can walk 30 minutes without difficulty, it is safe to try more intense  activity such as jogging, treadmill, bicycling, low-impact aerobics, swimming, etc. Save the most intensive and strenuous activity for last (Usually 4-8 weeks after surgery) such as sit-ups, heavy lifting, contact sports, etc.  Refrain from any intense heavy lifting or straining until you are off narcotics for pain control.  You will have off days, but things should improve week-by-week. DO NOT PUSH THROUGH PAIN.  Let pain be your guide: If it hurts to do something, don't do it.   Lifting restrictions   Complete by: As directed    If you can walk 30 minutes without difficulty, it is safe to try more intense activity such as jogging, treadmill, bicycling, low-impact aerobics, swimming, etc. Save the most intensive and strenuous activity for last (Usually 4-8 weeks after surgery) such as sit-ups, heavy lifting, contact sports, etc.   Refrain from any intense heavy lifting or straining until you are off narcotics for pain control.  You will have off days, but things should improve week-by-week. DO NOT PUSH THROUGH PAIN.  Let pain be your guide: If it hurts to do something, don't do it.  Pain is your body warning you to avoid that activity for another week until the pain goes down.   May shower / Bathe   Complete by: As directed    May walk up steps   Complete by: As directed    Remove dressing in 72 hours   Complete by: As directed    Make sure all dressings are removed by the third day after surgery.  Leave incisions open to air.  OK to cover incisions with gauze or bandages as desired   Sexual Activity Restrictions   Complete by: As directed    You may have sexual intercourse when it is comfortable. If it hurts to do something, stop.       Allergies as of 03/27/2023       Reactions   Bactrim [sulfamethoxazole-trimethoprim] Anaphylaxis, Shortness Of Breath, Swelling   Lisinopril Cough   Losartan Cough   Bee Pollen Other (See Comments)   Seasonal allergies   Ivp Dye [iodinated Contrast  Media] Nausea And Vomiting   Metrizamide Nausea And Vomiting   Pollen Extract    Seasonal allergies   Sulfa Antibiotics Swelling   Eyes and lips swelled up   Sulfasalazine Swelling   Eyes and lips swelled up   Other    Blood Product Refusal    Peanut-containing Drug Products Rash        Medication List  STOP taking these medications    predniSONE 50 MG tablet Commonly known as: DELTASONE       TAKE these medications    acetaminophen 500 MG tablet Commonly known as: TYLENOL Take 1,000 mg by mouth every 6 (six) hours as needed for moderate pain.   amLODipine 10 MG tablet Commonly known as: NORVASC Take 1 tablet (10 mg total) by mouth daily.   atorvastatin 40 MG tablet Commonly known as: LIPITOR TAKE 1 TABLET BY MOUTH DAILY   BD Pen Needle Nano U/F 32G X 4 MM Misc Generic drug: Insulin Pen Needle USE TO INJECT VICTOZA ONCE DAILY   benzonatate 100 MG capsule Commonly known as: Tessalon Perles Take 1 capsule (100 mg total) by mouth 3 (three) times daily as needed for cough. What changed: when to take this   carvedilol 6.25 MG tablet Commonly known as: COREG TAKE 1 TABLET BY MOUTH TWICE A DAY WITH MEALS   diphenhydrAMINE 50 MG tablet Commonly known as: BENADRYL Take 50 mg of Benadryl 1 hour prior to your CT scan   DULoxetine 30 MG capsule Commonly known as: CYMBALTA Take 1 capsule (30 mg total) by mouth daily.   ferrous sulfate 325 (65 FE) MG EC tablet Take 1 tablet (325 mg total) by mouth every other day.   fluticasone 50 MCG/ACT nasal spray Commonly known as: FLONASE Place 2 sprays into both nostrils daily as needed for allergies or rhinitis.   guaiFENesin 600 MG 12 hr tablet Commonly known as: Mucinex Take 1 tablet (600 mg total) by mouth 2 (two) times daily.   Hair Skin & Nails Tabs Take 1 tablet by mouth daily.   Hinda Glatter Sustenna injection Generic drug: Paliperidone ER Inject 117 mg into the muscle every 30 (thirty) days.   metFORMIN  1000 MG tablet Commonly known as: GLUCOPHAGE Take 1 tablet (1,000 mg total) by mouth 2 (two) times daily.   omeprazole 40 MG capsule Commonly known as: PRILOSEC Take 1 capsule (40 mg total) by mouth daily.   traMADol 50 MG tablet Commonly known as: ULTRAM Take 1-2 tablets (50-100 mg total) by mouth every 6 (six) hours as needed for moderate pain.   Victoza 18 MG/3ML Sopn Generic drug: liraglutide Inject 1.8 mg into the skin daily.               Discharge Care Instructions  (From admission, onward)           Start     Ordered   03/25/23 0000  Discharge wound care:       Comments: It is good for closed incisions and even open wounds to be washed every day.  Shower every day.  Short baths are fine.  Wash the incisions and wounds clean with soap & water.    You may leave closed incisions open to air if it is dry.   You may cover the incision with clean gauze & replace it after your daily shower for comfort.  TEGADERM:  You have clear gauze band-aid dressings over your closed incision(s).  Remove the dressings 3 days after surgery = Monday 5/13   03/25/23 1037            Significant Diagnostic Studies:  No results found for this or any previous visit (from the past 72 hour(s)).  No results found.  Past Medical History:  Diagnosis Date   Anemia    Anxiety    Congenital cerebral palsy (HCC)    Depression    Diabetes mellitus due  to abnormal insulin (HCC)    Dyspnea    Edema 06/10/2017   Endometriosis    Hearing impairment    Heart murmur    at birth   Hypertension    Neuromuscular disorder [facility])    Cerebral palsy- very mild left side of brain   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 US Neck 11/06/14: Bilateral nodules. Dominant right lower  pole nodule measures 2.3 cm. Findings meet consensus criteria for biopsy. Ultrasound-guided fine needle aspiration should be considered Aspiration pathology: benign follicular nodule   Transfusion of blood product refused for religious reason 12/28/2022    Past Surgical History:  Procedure Laterality Date   CESAREAN SECTION     EXPLORATORY LAPAROTOMY WITH ABDOMINAL MASS EXCISION     MYOMECTOMY N/A 08/05/2015   Procedure: Abdominal Hysterectomy, Bilateral Salpingectomy;  Surgeon: Tereso Newcomer, MD;  Location: [facility];  Service: Gynecology;  Laterality: N/A;   ROBOTIC ASSISTED SALPINGO OOPHERECTOMY Bilateral 03/24/2023   Procedure: XI ROBOTIC ASSISTED  BILATERAL  OOPHORECTOMY, LYSIS OF ADHESIONS;  Surgeon: Carver Fila, MD;  Location: [facility];  Service: Gynecology;  Laterality: Bilateral;    Social History   Socioeconomic History   Marital status: Married    Spouse name: Not on file   Number of children: 1   Years of education: Not on file   Highest education level: Not on file  Occupational History   Not on file  Tobacco Use   Smoking status: Never   Smokeless tobacco: Never  Vaping Use   Vaping Use: Never used  Substance and Sexual Activity   Alcohol use: No   Drug use: No   Sexual activity: Not Currently    Birth control/protection: Surgical  Other Topics Concern   Not on file  Social History Narrative   Not on file   Social Determinants of Health   Financial Resource Strain: Low Risk  (04/16/2022)   Overall Financial Resource Strain (CARDIA)    Difficulty of Paying Living Expenses: Not hard at all  Food Insecurity: No Food Insecurity (12/03/2022)   Hunger Vital Sign    Worried About Running Out of Food in the Last Year: Never true    Ran Out of Food in the Last Year: Never true  Transportation Needs: Unmet Transportation Needs (12/03/2022)   PRAPARE - Transportation    Lack of Transportation (Medical): No    Lack of Transportation (Non-Medical): Yes  Physical  Activity: Inactive (04/16/2022)   Exercise Vital Sign    Days of Exercise per Week: 0 days    Minutes of Exercise per Session: 0 min  Stress: No Stress Concern Present (04/16/2022)   [employer] of Occupational Health - Occupational Stress Questionnaire    Feeling of Stress : Only a little  Social Connections: Moderately Isolated (12/03/2022)   Social Connection and Isolation Panel [NHANES]    Frequency of Communication with Friends and Family: More than three times a week    Frequency of Social Gatherings with Friends and Family: More than three times a week    Attends Religious Services: More than 4 times per year    Active Member of [employer] or Organizations: No    Attends Banker Meetings: Never  Marital Status: Separated  Intimate Partner Violence: Not At Risk (12/03/2022)   Humiliation, Afraid, Rape, and Kick questionnaire    Fear of Current or Ex-Partner: No    Emotionally Abused: No    Physically Abused: No    Sexually Abused: No    Family History  Problem Relation Age of Onset   Diabetes Mother    Breast cancer Mother    Cancer Mother 66       breast cancer   Heart failure Mother    Hypertension Mother    Diabetes Mellitus II Mother    Cancer Father 67       colon cancer   Diabetes Mellitus II Father    Hypertension Father    Cancer Maternal Aunt 32       breast cancer   Cancer Maternal Grandmother        lung cancer   Colon cancer Neg Hx    Esophageal cancer Neg Hx    Pancreatic cancer Neg Hx    Ulcerative colitis Neg Hx     No current facility-administered medications for this encounter.   Current Outpatient Medications  Medication Sig Dispense Refill   acetaminophen (TYLENOL) 500 MG tablet Take 1,000 mg by mouth every 6 (six) hours as needed for moderate pain.     amLODipine (NORVASC) 10 MG tablet Take 1 tablet (10 mg total) by mouth daily. 30 tablet 11   atorvastatin (LIPITOR) 40 MG tablet TAKE 1 TABLET BY MOUTH DAILY 90 tablet 3    benzonatate (TESSALON PERLES) 100 MG capsule Take 1 capsule (100 mg total) by mouth 3 (three) times daily as needed for cough. (Patient taking differently: Take 100 mg by mouth daily as needed for cough.) 20 capsule 0   carvedilol (COREG) 6.25 MG tablet TAKE 1 TABLET BY MOUTH TWICE A DAY WITH MEALS 60 tablet 3   DULoxetine (CYMBALTA) 30 MG capsule Take 1 capsule (30 mg total) by mouth daily. 30 capsule 3   ferrous sulfate 325 (65 FE) MG EC tablet Take 1 tablet (325 mg total) by mouth every other day. 15 tablet 2   fluticasone (FLONASE) 50 MCG/ACT nasal spray Place 2 sprays into both nostrils daily as needed for allergies or rhinitis.     liraglutide (VICTOZA) 18 MG/3ML SOPN Inject 1.8 mg into the skin daily. 9 mL 5   metFORMIN (GLUCOPHAGE) 1000 MG tablet Take 1 tablet (1,000 mg total) by mouth 2 (two) times daily. 180 tablet 3   Multiple Vitamins-Minerals (HAIR SKIN & NAILS) TABS Take 1 tablet by mouth daily.     omeprazole (PRILOSEC) 40 MG capsule Take 1 capsule (40 mg total) by mouth daily. 30 capsule 1   Paliperidone ER (INVEGA SUSTENNA) injection Inject 117 mg into the muscle every 30 (thirty) days.     diphenhydrAMINE (BENADRYL) 50 MG tablet Take 50 mg of Benadryl 1 hour prior to your CT scan 1 tablet 0   guaiFENesin (MUCINEX) 600 MG 12 hr tablet Take 1 tablet (600 mg total) by mouth 2 (two) times daily. 14 tablet 1   Insulin Pen Needle (BD PEN NEEDLE NANO U/F) 32G X 4 MM MISC USE TO INJECT VICTOZA ONCE DAILY 100 each 1   traMADol (ULTRAM) 50 MG tablet Take 1-2 tablets (50-100 mg total) by mouth every 6 (six) hours as needed for moderate pain. 30 tablet 0     Allergies  Allergen Reactions   Bactrim [Sulfamethoxazole-Trimethoprim] Anaphylaxis, Shortness Of Breath and Swelling   Lisinopril Cough  Losartan Cough   Bee Pollen Other (See Comments)    Seasonal allergies   Ivp Dye [Iodinated Contrast Media] Nausea And Vomiting   Metrizamide Nausea And Vomiting   Pollen Extract     Seasonal  allergies   Sulfa Antibiotics Swelling    Eyes and lips swelled up   Sulfasalazine Swelling    Eyes and lips swelled up   Other     Blood Product Refusal    Peanut-Containing Drug Products Rash    Signed:   Ardeth Sportsman, MD, FACS, MASCRS Esophageal, Gastrointestinal & Colorectal Surgery Robotic and Minimally Invasive Surgery  [facility] [address] [place], [place] [address] [phone] Fax [phone] Main  CONTACT INFORMATION:  Weekday (9AM-5PM): Call [facility] main office at [phone]  Weeknight (5PM-9AM) or Weekend/Holiday: Check [email or web address] (password " [email or web address]") for General Surgery [facility] coverage  (Please, do not use SecureChat as it is not reliable communication to reach operating surgeons for immediate patient care given surgeries/outpatient duties/clinic/cross-coverage/off post-call which would lead to a delay in care.  Epic staff messaging available for outptient concerns, but may not be answered for 48 hours or more).     03/30/2023, 8:09 AM

## 2023-04-04 ENCOUNTER — Telehealth: Payer: Self-pay

## 2023-04-04 NOTE — Telephone Encounter (Signed)
Received call from Pts relative regarding stomach incision on pt.   Pt had joint surgery on 5/9 with Dr. Michaell Cowing and Dr. Pricilla Holm. The below is from the colectomy incision. States that incision is leaking clear but sometimes has a tinged red color with slight odor after gauze is soaked.   Informed to call Dr. Gordy Savers office at [phone].

## 2023-04-05 ENCOUNTER — Other Ambulatory Visit: Payer: Self-pay

## 2023-04-05 DIAGNOSIS — C184 Malignant neoplasm of transverse colon: Secondary | ICD-10-CM

## 2023-04-05 DIAGNOSIS — D500 Iron deficiency anemia secondary to blood loss (chronic): Secondary | ICD-10-CM

## 2023-04-05 NOTE — Progress Notes (Unsigned)
[facility]   [phone] [phone]   Clinic Follow up Note   Patient Care Team: Marrianne Mood, MD as PCP - General Hanley Seamen, Dustin Folks, MD as Referring Physician (Optometry) Malachy Mood, MD as Consulting Physician (Oncology) Karie Soda, MD as Consulting Physician (General Surgery) Armbruster, Willaim Rayas, MD as Consulting Physician (Gastroenterology) Carver Fila, MD as Consulting Physician (Gynecologic Oncology)  Date of Service:  04/06/2023  CHIEF COMPLAINT: f/u of   CURRENT THERAPY:  Pending work up  ASSESSMENT:  [name] is a 52 y.o. female with   Primary cancer of hepatic flexure of colon (HCC) pT3N0M0, stage II --Patient presents with iron deficient anemia since 06/2022, and received IV iron and EPO in hospital, she was referred to GI at that time. -She finally had colonoscopy and the EGD on December 14, 2022, which showed a large mass in proximal transverse colon, and a biopsy confirmed adenocarcinoma.  I discussed the findings with her in detail. -Her staging CT scan will showed a large ovarian cyst, and bilateral adrenal gland nodules, no other definitive evidence of node or distant metastasis.  CT scan of the adrenal cortical showed adrenal adenoma. -She underwent right hemicolectomy and lymph node biopsy in early May 2024, I reviewed her surgical pathology findings with patient.  She had a T3 lesion, 28 lymph nodes were negative, surgical margins were negative, no high risk features.  -I do not recommend adjuvant chemotherapy. 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 -Pt has f/u schedule with Dr. Pricilla Holm 5/31 -no chemotherapy , but close monitoring -Routine scan once a year. -f/u q3-6 months. -lab in 6 weeks to check Iron level. and f/u in 3 months   SUMMARY OF ONCOLOGIC HISTORY: Oncology History  Primary cancer of hepatic flexure of colon (HCC)  12/21/2022 Imaging    IMPRESSION: 1. Colon is minimally distended and no discrete colonic lesion is identified to correspond with patient's given history of colonic neoplasm. 2. Bilateral adrenal nodules measuring 12 mm in the right adrenal gland and 1 cm in the left adrenal gland are new from CT August 20 2015. These likely reflect benign adrenal adenomas however they are technically indeterminate and small metastatic lesions are not excluded, consider more definitive characterization with pre and postcontrast adrenal protocol CT or MRI versus attention on follow-up oncologic imaging. 3. Tiny bilateral pulmonary nodules along the major fissures measuring up to 2 mm are nonspecific but favored to reflect intrapulmonary lymph nodes. However, given lack of prior imaging for comparison these warrant attention on short-term interval follow-up dedicated chest CT. 4. 7.7 cm left ovarian cyst, recommend further evaluation by dedicated pelvic ultrasound. 5. Nonobstructive bilateral renal stones measure up to 4 mm in the right kidney.   12/26/2022 Initial Diagnosis   Cancer of transverse colon (HCC)   03/24/2023 Cancer Staging   Staging form: Colon and Rectum, AJCC 8th Edition - Pathologic stage from 03/24/2023: Stage IIA (pT3, pN0, cM0) - Signed by Malachy Mood, MD on 04/05/2023 Total positive nodes: 0 Histologic grading system: 4  grade system Histologic grade (G): G2 Residual tumor (R): R0 - None      INTERVAL HISTORY:  [name] is here for a follow up of Cancer of Transverse Colon. She was last seen by me on  12/27/2022. She presents to the clinic accompanied by sister. Pt sister said the pt is recovering well. Pt has     All other systems were reviewed with the patient and are negative.  MEDICAL HISTORY:  Past Medical History:  Diagnosis Date   Anemia    Anxiety    Congenital cerebral palsy (HCC)    Depression    Diabetes mellitus due to abnormal insulin (HCC)    Dyspnea    Edema 06/10/2017   Endometriosis    Hearing impairment    Heart murmur    at birth   Hypertension    Neuromuscular disorder (HCC)    Cerebral palsy- very mild left side of brain   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 US Neck 11/06/14: Bilateral nodules. Dominant right lower pole nodule measures 2.3 cm. Findings meet consensus criteria for biopsy. Ultrasound-guided fine needle aspiration should be considered Aspiration pathology: benign follicular nodule   Transfusion of blood product refused for religious reason 12/28/2022    SURGICAL HISTORY: Past Surgical History:  Procedure Laterality Date   CESAREAN SECTION     EXPLORATORY LAPAROTOMY WITH ABDOMINAL MASS EXCISION     MYOMECTOMY N/A 08/05/2015   Procedure: Abdominal Hysterectomy, Bilateral Salpingectomy;  Surgeon: Tereso Newcomer, MD;  Location: [facility];  Service: Gynecology;  Laterality: N/A;   ROBOTIC ASSISTED SALPINGO OOPHERECTOMY Bilateral 03/24/2023   Procedure: XI ROBOTIC ASSISTED  BILATERAL  OOPHORECTOMY, LYSIS OF ADHESIONS;  Surgeon: Carver Fila, MD;  Location: [facility];  Service: Gynecology;  Laterality: Bilateral;    I have reviewed the social history and family history with the patient and they are unchanged from previous note.  ALLERGIES:  is allergic to bactrim [sulfamethoxazole-trimethoprim], lisinopril, losartan, bee  pollen, ivp dye [iodinated contrast media], metrizamide, pollen extract, sulfa antibiotics, sulfasalazine, other, and peanut-containing drug products.  MEDICATIONS:  Current Outpatient Medications  Medication Sig Dispense Refill   acetaminophen (TYLENOL) 500 MG tablet Take 1,000 mg by mouth every 6 (six) hours as needed for moderate pain.     amLODipine (NORVASC) 10 MG tablet Take 1 tablet (10 mg total) by mouth daily. 30 tablet 11   atorvastatin (LIPITOR) 40 MG tablet TAKE 1 TABLET BY MOUTH DAILY 90 tablet 3   benzonatate (TESSALON PERLES) 100 MG capsule Take 1 capsule (100 mg total) by mouth 3 (three) times daily as needed for cough. (Patient taking differently: Take 100 mg by mouth daily as needed for cough.) 20 capsule 0   carvedilol (COREG) 6.25 MG tablet TAKE 1 TABLET BY MOUTH TWICE A DAY WITH MEALS 60 tablet 3   diphenhydrAMINE (BENADRYL) 50 MG tablet Take 50 mg of Benadryl 1 hour prior to your CT scan 1 tablet 0   DULoxetine (CYMBALTA) 30 MG capsule Take 1 capsule (30 mg total) by mouth daily. 30 capsule 3   ferrous sulfate 325 (65 FE) MG EC tablet Take 1 tablet (325 mg total) by mouth every other day. 15 tablet 2   fluticasone (FLONASE) 50 MCG/ACT  nasal spray Place 2 sprays into both nostrils daily as needed for allergies or rhinitis.     guaiFENesin (MUCINEX) 600 MG 12 hr tablet Take 1 tablet (600 mg total) by mouth 2 (two) times daily. 14 tablet 1   Insulin Pen Needle (BD PEN NEEDLE NANO U/F) 32G X 4 MM MISC USE TO INJECT VICTOZA ONCE DAILY 100 each 1   liraglutide (VICTOZA) 18 MG/3ML SOPN Inject 1.8 mg into the skin daily. 9 mL 5   metFORMIN (GLUCOPHAGE) 1000 MG tablet Take 1 tablet (1,000 mg total) by mouth 2 (two) times daily. 180 tablet 3   Multiple Vitamins-Minerals (HAIR SKIN & NAILS) TABS Take 1 tablet by mouth daily.     omeprazole (PRILOSEC) 40 MG capsule Take 1 capsule (40 mg total) by mouth daily. 30 capsule 1   Paliperidone ER (INVEGA SUSTENNA) injection Inject 117 mg into  the muscle every 30 (thirty) days.     traMADol (ULTRAM) 50 MG tablet Take 1-2 tablets (50-100 mg total) by mouth every 6 (six) hours as needed for moderate pain. 30 tablet 0   No current facility-administered medications for this visit.    PHYSICAL EXAMINATION: ECOG PERFORMANCE STATUS: 1 - Symptomatic but completely ambulatory  Vitals:   04/06/23 1455  BP: 121/78  Pulse: (!) 108  Resp: 18  Temp: 98.2 F (36.8 C)  SpO2: 100%   Wt Readings from Last 3 Encounters:  04/06/23 211 lb 1.6 oz (95.8 kg)  03/27/23 220 lb 10.9 oz (100.1 kg)  03/15/23 206 lb (93.4 kg)     GENERAL:alert, no distress and comfortable SKIN: skin color normal, no rashes or significant lesions EYES: normal, Conjunctiva are pink and non-injected, sclera clear  NEURO: alert & oriented x 3 with fluent speech ABDOMEN:(-)abdomen soft, (-) non-tender and normal bowel sounds. Incision in the lower abdomen is still open with minimum discharge.    LABORATORY DATA:  I have reviewed the data as listed    Latest Ref Rng & Units 04/06/2023    2:15 PM 03/27/2023    4:21 AM 03/26/2023    4:38 AM  CBC  WBC 4.0 - 10.5 K/uL 6.3     Hemoglobin 12.0 - 15.0 g/dL 7.0  5.3  5.3   Hematocrit 36.0 - 46.0 % 22.6     Platelets 150 - 400 K/uL 475           Latest Ref Rng & Units 04/06/2023    2:10 PM 03/27/2023    4:21 AM 03/26/2023    4:38 AM  CMP  Glucose 70 - 99 mg/dL 295  284    BUN 6 - 20 mg/dL 6  <5    Creatinine 1.32 - 1.00 mg/dL 4.40  1.02  7.25   Sodium 135 - 145 mmol/L 141  137    Potassium 3.5 - 5.1 mmol/L 3.5  3.5  2.9   Chloride 98 - 111 mmol/L 104  103    CO2 22 - 32 mmol/L 29  25    Calcium 8.9 - 10.3 mg/dL 9.0  8.4    Total Protein 6.5 - 8.1 g/dL 6.5     Total Bilirubin 0.3 - 1.2 mg/dL 0.4     Alkaline Phos 38 - 126 U/L 53     AST 15 - 41 U/L 9     ALT 0 - 44 U/L 9         RADIOGRAPHIC STUDIES: I have personally reviewed the radiological images as listed and agreed with the findings  in the  report. No results found.    No orders of the defined types were placed in this encounter.  All questions were answered. The patient knows to call the clinic with any problems, questions or concerns. No barriers to learning was detected. The total time spent in the appointment was 30 minutes.     Malachy Mood, MD 04/06/2023   Carolin Coy, CMA, am acting as scribe for Malachy Mood, MD.   I have reviewed the above documentation for accuracy and completeness, and I agree with the above.

## 2023-04-05 NOTE — Assessment & Plan Note (Addendum)
pT3N0M0, stage II --Patient presents with iron deficient anemia since 06/2022, and received IV iron and EPO in hospital, she was referred to GI at that time. -She finally had colonoscopy and the EGD on December 14, 2022, which showed a large mass in proximal transverse colon, and a biopsy confirmed adenocarcinoma.  I discussed the findings with her in detail. -Her staging CT scan will showed a large ovarian cyst, and bilateral adrenal gland nodules, no other definitive evidence of node or distant metastasis.  CT scan of the adrenal cortical showed adrenal adenoma. -She underwent right hemicolectomy and lymph node biopsy in early May 2024, I reviewed her surgical pathology findings with patient.  She had a T3 lesion, 428 lymph nodes were negative, surgical margins were negative, no high risk features.  -will request MSI on her surgical sample  -I recommend circulating tumor DNA test with [employer], if negative, adjuvant chemotherapy is not recommended. -Hematuria H, I also recommend genetic testing.

## 2023-04-06 ENCOUNTER — Encounter: Payer: Self-pay | Admitting: Hematology

## 2023-04-06 ENCOUNTER — Inpatient Hospital Stay: Payer: Medicare Other | Attending: Hematology

## 2023-04-06 ENCOUNTER — Other Ambulatory Visit: Payer: Self-pay

## 2023-04-06 ENCOUNTER — Inpatient Hospital Stay (HOSPITAL_BASED_OUTPATIENT_CLINIC_OR_DEPARTMENT_OTHER): Payer: Medicare Other | Admitting: Hematology

## 2023-04-06 VITALS — BP 121/78 | HR 108 | Temp 98.2°F | Resp 18 | Ht 64.0 in | Wt 211.1 lb

## 2023-04-06 DIAGNOSIS — C184 Malignant neoplasm of transverse colon: Secondary | ICD-10-CM | POA: Insufficient documentation

## 2023-04-06 DIAGNOSIS — C183 Malignant neoplasm of hepatic flexure: Secondary | ICD-10-CM

## 2023-04-06 DIAGNOSIS — D500 Iron deficiency anemia secondary to blood loss (chronic): Secondary | ICD-10-CM

## 2023-04-06 LAB — CBC WITH DIFFERENTIAL (CANCER CENTER ONLY)
Abs Immature Granulocytes: 0.06 10*3/uL (ref 0.00–0.07)
Basophils Absolute: 0 10*3/uL (ref 0.0–0.1)
Basophils Relative: 0 %
Eosinophils Absolute: 0.1 10*3/uL (ref 0.0–0.5)
Eosinophils Relative: 2 %
HCT: 22.6 % — ABNORMAL LOW (ref 36.0–46.0)
Hemoglobin: 7 g/dL — ABNORMAL LOW (ref 12.0–15.0)
Immature Granulocytes: 1 %
Lymphocytes Relative: 20 %
Lymphs Abs: 1.2 10*3/uL (ref 0.7–4.0)
MCH: 24.6 pg — ABNORMAL LOW (ref 26.0–34.0)
MCHC: 31 g/dL (ref 30.0–36.0)
MCV: 79.6 fL — ABNORMAL LOW (ref 80.0–100.0)
Monocytes Absolute: 0.5 10*3/uL (ref 0.1–1.0)
Monocytes Relative: 9 %
Neutro Abs: 4.4 10*3/uL (ref 1.7–7.7)
Neutrophils Relative %: 68 %
Platelet Count: 475 10*3/uL — ABNORMAL HIGH (ref 150–400)
RBC: 2.84 MIL/uL — ABNORMAL LOW (ref 3.87–5.11)
RDW: 23.6 % — ABNORMAL HIGH (ref 11.5–15.5)
WBC Count: 6.3 10*3/uL (ref 4.0–10.5)
nRBC: 1 % — ABNORMAL HIGH (ref 0.0–0.2)

## 2023-04-06 LAB — CEA (ACCESS): CEA (CHCC): <1 ng/mL (ref 0.00–5.00)

## 2023-04-06 LAB — FERRITIN: Ferritin: 46 ng/mL (ref 11–307)

## 2023-04-06 LAB — IRON AND IRON BINDING CAPACITY (CC-WL,HP ONLY)
Iron: 22 ug/dL — ABNORMAL LOW (ref 28–170)
Saturation Ratios: 6 % — ABNORMAL LOW (ref 10.4–31.8)
TIBC: 358 ug/dL (ref 250–450)
UIBC: 336 ug/dL (ref 148–442)

## 2023-04-06 LAB — CMP (CANCER CENTER ONLY)
ALT: 9 U/L (ref 0–44)
AST: 9 U/L — ABNORMAL LOW (ref 15–41)
Albumin: 3.9 g/dL (ref 3.5–5.0)
Alkaline Phosphatase: 53 U/L (ref 38–126)
Anion gap: 8 (ref 5–15)
BUN: 6 mg/dL (ref 6–20)
CO2: 29 mmol/L (ref 22–32)
Calcium: 9 mg/dL (ref 8.9–10.3)
Chloride: 104 mmol/L (ref 98–111)
Creatinine: 0.51 mg/dL (ref 0.44–1.00)
GFR, Estimated: >60 mL/min (ref >60)
Glucose, Bld: 137 mg/dL — ABNORMAL HIGH (ref 70–99)
Potassium: 3.5 mmol/L (ref 3.5–5.1)
Sodium: 141 mmol/L (ref 135–145)
Total Bilirubin: 0.4 mg/dL (ref 0.3–1.2)
Total Protein: 6.5 g/dL (ref 6.5–8.1)

## 2023-04-07 ENCOUNTER — Other Ambulatory Visit: Payer: Self-pay

## 2023-04-07 NOTE — Progress Notes (Signed)
Spoke with Bjorn Loser in [facility] to add-on a MSI to pt's Surgical pathology [id number].  Bjorn Loser stated she will tell Dr. Brandon Callas when he comes in this morning about the add-on.

## 2023-04-08 ENCOUNTER — Telehealth: Payer: Self-pay | Admitting: Hematology

## 2023-04-08 NOTE — Telephone Encounter (Signed)
Contacted patient to scheduled appointments. Left message with appointment details and a call back number if patient had any questions or could not accommodate the time we provided.   

## 2023-04-12 ENCOUNTER — Ambulatory Visit (INDEPENDENT_AMBULATORY_CARE_PROVIDER_SITE_OTHER): Payer: Medicare Other | Admitting: Student

## 2023-04-12 VITALS — BP 116/70 | HR 78 | Temp 98.0°F | Wt 210.6 lb

## 2023-04-12 DIAGNOSIS — B356 Tinea cruris: Secondary | ICD-10-CM

## 2023-04-12 DIAGNOSIS — Z1231 Encounter for screening mammogram for malignant neoplasm of breast: Secondary | ICD-10-CM

## 2023-04-12 LAB — SURGICAL PATHOLOGY

## 2023-04-12 MED ORDER — MICONAZOLE NITRATE 2 % EX CREA
1.0000 | TOPICAL_CREAM | Freq: Two times a day (BID) | CUTANEOUS | 0 refills | Status: DC
Start: 2023-04-12 — End: 2024-07-23

## 2023-04-12 NOTE — Assessment & Plan Note (Signed)
Patient with likely tinea cruris or possibly candida infection. Discussed with patient that she should use miconazole cream on her upper thighs for two weeks. If her symptoms do not resolve she should schedule an appointment with our clinic.   She is in agreement with this.

## 2023-04-12 NOTE — Progress Notes (Unsigned)
   CC: itching and sloughing of skin of the inner thigh  HPI:  [name] is a 52 y.o. F with PMH per below who presents for itching in her upper inner thigh along with skin sloughing. She states that her symptoms began approximately one week ago. Patient states that she first noticed this after wiping this region with a cloth and noticing skin color on the cloth. She has also had some itching in this area. She states she was unable to see the region to see if there was erythema or a rash. She did use powder to help with the itching but has otherwise not trialed any treatment. She notes that she recently moved in with her sister while she is recovering from recent hemicolectomy and is using a new soap but otherwise has had no changes in what comes in contact with her skin. Patient denies itching of her external vagina.   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 US Neck 11/06/14: Bilateral nodules. Dominant right lower pole nodule measures 2.3 cm. Findings meet consensus criteria for biopsy. Ultrasound-guided fine needle aspiration should be considered Aspiration pathology: benign follicular nodule   Transfusion of blood product refused for religious reason 12/28/2022   Review of Systems:  Please see problem based charting under encounters tab for further details.    Physical Exam:  Vitals:   04/12/23 0826  BP:  116/70  Pulse: 78  Temp: 98 F (36.7 C)  TempSrc: Oral  SpO2: 100%  Weight: 210 lb 9.6 oz (95.5 kg)   Constitutional: Well-developed, well-nourished, and in no distress.   Cardiovascular: Normal rate, regular rhythm, intact distal pulses. No gallop and no friction rub.  No murmur heard. No lower extremity edema  Pulmonary: Non labored breathing on room air, no wheezing or rales  Abdominal: Soft. Normal bowel sounds. Non distended and non tender Musculoskeletal: Normal range of motion.        General: No tenderness or edema.  Neurological: Alert and oriented to person, place, and time. Non focal  Skin: Skin is warm.     R inner thigh    L inner thigh     Assessment & Plan:   See Encounters Tab for problem based charting.  Patient discussed with Dr.  Sol Blazing

## 2023-04-12 NOTE — Patient Instructions (Addendum)
Please use the miconazole cream on the areas that itch in your groin area for two weeks. If this does not help your symptoms please schedule another appointment with [place].   This cream may be purchased over the counter.

## 2023-04-13 NOTE — Progress Notes (Signed)
Internal Medicine Clinic Attending  Case discussed with Dr. Carter  At the time of the visit.  We reviewed the resident's history and exam and pertinent patient test results.  I agree with the assessment, diagnosis, and plan of care documented in the resident's note.  

## 2023-04-13 NOTE — Addendum Note (Signed)
Addended by: Dickie La on: 04/13/2023 05:39 PM   Modules accepted: Level of Service

## 2023-04-14 NOTE — Anesthesia Postprocedure Evaluation (Signed)
Anesthesia Post Note  Patient: Vernessa Gailes  Procedure(s) Performed: ROBOTIC RIGHT COLECTOMY PROXIMAL (Abdomen) XI ROBOTIC ASSISTED  BILATERAL  OOPHORECTOMY, LYSIS OF ADHESIONS (Bilateral: Abdomen)     Patient location during evaluation: PACU Anesthesia Type: General Level of consciousness: sedated Pain management: pain level controlled Vital Signs Assessment: post-procedure vital signs reviewed and stable Respiratory status: spontaneous breathing Cardiovascular status: stable Postop Assessment: no apparent nausea or vomiting Anesthetic complications: no  No notable events documented.  Last Vitals:  Vitals:   03/26/23 2204 03/27/23 0534  BP: 104/66 133/80  Pulse: 89 100  Resp:    Temp: 36.6 C 36.7 C  SpO2: 99% 98%    Last Pain:  Vitals:   03/27/23 0759  TempSrc:   PainSc: 0-No pain                 Caren Macadam

## 2023-04-15 ENCOUNTER — Inpatient Hospital Stay (HOSPITAL_BASED_OUTPATIENT_CLINIC_OR_DEPARTMENT_OTHER): Payer: Medicare Other | Admitting: Gynecologic Oncology

## 2023-04-15 ENCOUNTER — Encounter: Payer: Self-pay | Admitting: Gynecologic Oncology

## 2023-04-15 ENCOUNTER — Encounter: Payer: Self-pay | Admitting: Student

## 2023-04-15 VITALS — BP 110/77 | HR 100 | Temp 98.3°F | Resp 81 | Ht 64.17 in | Wt 206.4 lb

## 2023-04-15 DIAGNOSIS — R1900 Intra-abdominal and pelvic swelling, mass and lump, unspecified site: Secondary | ICD-10-CM

## 2023-04-15 NOTE — Progress Notes (Signed)
Gynecologic Oncology Return Clinic Visit  04/15/23  Reason for Visit: follow-up after surgery  Treatment History: Oncology History  Primary cancer of hepatic flexure of colon (HCC)  12/21/2022 Imaging    IMPRESSION: 1. Colon is minimally distended and no discrete colonic lesion is identified to correspond with patient's given history of colonic neoplasm. 2. Bilateral adrenal nodules measuring 12 mm in the right adrenal gland and 1 cm in the left adrenal gland are new from CT August 20 2015. These likely reflect benign adrenal adenomas however they are technically indeterminate and small metastatic lesions are not excluded, consider more definitive characterization with pre and postcontrast adrenal protocol CT or MRI versus attention on follow-up oncologic imaging. 3. Tiny bilateral pulmonary nodules along the major fissures measuring up to 2 mm are nonspecific but favored to reflect intrapulmonary lymph nodes. However, given lack of prior imaging for comparison these warrant attention on short-term interval follow-up dedicated chest CT. 4. 7.7 cm left ovarian cyst, recommend further evaluation by dedicated pelvic ultrasound. 5. Nonobstructive bilateral renal stones measure up to 4 mm in the right kidney.   12/26/2022 Initial Diagnosis   Cancer of transverse colon (HCC)   03/24/2023 Cancer Staging   Staging form: Colon and Rectum, AJCC 8th Edition - Pathologic stage from 03/24/2023: Stage IIA (pT3, pN0, cM0) - Signed by Malachy Mood, MD on 04/05/2023 Total positive nodes: 0 Histologic grading system: 4 grade system Histologic grade (G): G2 Residual tumor (R): R0 - None    Hysterectomy performed approximately in 2016 for fibroids, DUB and endometriosis. Minimal endometriosis findings intra-op. Normal tubes and ovaries at that time. Painful periods when had them. Denies menopausal symptoms. No abnormal pap smears.  CT A/P on 01/16/23: Stable 1.2 cm benign right adrenal adenoma.  Stable mild nodular hyperplasia of the left adrenal gland. No followup imaging is recommended.   Bilateral nephrolithiasis. No evidence of hydronephrosis.   Pelvic ultrasound 12/29/22: 1. Left ovarian cyst has diminished in size from prior CT. Additionally patient was scanned 5 days apart and the cyst diminished in size over the course of the exams, initially 6.2 cm greatest dimension, subsequently 3.6 cm greatest dimension. Overall findings suggest a resolving hemorrhagic cyst, however examination is technically challenging. In this setting, recommend a follow-up ultrasound in 6 weeks to ensure stability and or potentially resolution. Alternatively, pelvic MRI characterization could be considered. 2. No pelvic free fluid.  03/24/23: Robotic-assisted laparoscopic bilateral oophorectomy, lysis of adhesions for approximately 25 minutes performed at the time of surgery for her colon cancer.  Interval History: Patient reports doing well.  Minimal intermittent incisional pain.  Denies any significant abdominal or pelvic pain.  Denies any vaginal bleeding.  Did had some vaginal itching, was treated for yeast infection with over-the-counter medicine with resolution of her symptoms.  Endorses bowel function alternates between normal and somewhat soft.  Denies any urinary symptoms.  Denies any hot flashes, mood changes, or other menopausal symptoms.  Past Medical/Surgical History: Past Medical History:  Diagnosis Date   Anemia    Anxiety    Congenital cerebral palsy (HCC)    Depression    Diabetes mellitus due to abnormal insulin (HCC)    Dyspnea    Edema 06/10/2017   Endometriosis    Hearing impairment    Heart murmur    at birth   Hypertension    Neuromuscular disorder (HCC)    Cerebral palsy- very mild left side of brain   Primary cancer of hepatic flexure of colon (HCC)  Schizoaffective disorder (HCC)    Sleep apnea    no cpap   Thyroid nodule 07/30/2014   CT scan from  08/27/2010: A 1.9 cm calcified nodule involving the lower pole of the right lobe of the thyroid gland. US Neck 11/06/14: Bilateral nodules. Dominant right lower pole nodule measures 2.3 cm. Findings meet consensus criteria for biopsy. Ultrasound-guided fine needle aspiration should be considered Aspiration pathology: benign follicular nodule   Transfusion of blood product refused for religious reason 12/28/2022    Past Surgical History:  Procedure Laterality Date   CESAREAN SECTION     EXPLORATORY LAPAROTOMY WITH ABDOMINAL MASS EXCISION     MYOMECTOMY N/A 08/05/2015   Procedure: Abdominal Hysterectomy, Bilateral Salpingectomy;  Surgeon: Tereso Newcomer, MD;  Location: [facility];  Service: Gynecology;  Laterality: N/A;   ROBOTIC ASSISTED SALPINGO OOPHERECTOMY Bilateral 03/24/2023   Procedure: XI ROBOTIC ASSISTED  BILATERAL  OOPHORECTOMY, LYSIS OF ADHESIONS;  Surgeon: Carver Fila, MD;  Location: [facility];  Service: Gynecology;  Laterality: Bilateral;    Family History  Problem Relation Age of Onset   Diabetes Mother    Breast cancer Mother    Cancer Mother 27       breast cancer   Heart failure Mother    Hypertension Mother    Diabetes Mellitus II Mother    Cancer Father 57       colon cancer   Diabetes Mellitus II Father    Hypertension Father    Cancer Maternal Aunt 45       breast cancer   Cancer Maternal Grandmother        lung cancer   Colon cancer Neg Hx    Esophageal cancer Neg Hx    Pancreatic cancer Neg Hx    Ulcerative colitis Neg Hx     Social History   Socioeconomic History   Marital status: Married    Spouse name: Not on file   Number of children: 1   Years of education: Not on file   Highest education level: Not on file  Occupational History   Not on file  Tobacco Use   Smoking status: Never   Smokeless tobacco: Never  Vaping Use   Vaping Use: Never used  Substance and Sexual Activity   Alcohol use: No   Drug use: No   Sexual activity: Not Currently     Birth control/protection: Surgical  Other Topics Concern   Not on file  Social History Narrative   Not on file   Social Determinants of Health   Financial Resource Strain: Low Risk  (04/16/2022)   Overall Financial Resource Strain (CARDIA)    Difficulty of Paying Living Expenses: Not hard at all  Food Insecurity: No Food Insecurity (12/03/2022)   Hunger Vital Sign    Worried About Running Out of Food in the Last Year: Never true    Ran Out of Food in the Last Year: Never true  Transportation Needs: Unmet Transportation Needs (12/03/2022)   PRAPARE - Transportation    Lack of Transportation (Medical): No    Lack of Transportation (Non-Medical): Yes  Physical Activity: Inactive (04/16/2022)   Exercise Vital Sign    Days of Exercise per Week: 0 days    Minutes of Exercise per Session: 0 min  Stress: No Stress Concern Present (04/16/2022)   [employer] of Occupational Health - Occupational Stress Questionnaire    Feeling of Stress : Only a little  Social Connections: Moderately Isolated (12/03/2022)   Social Connection and  Isolation Panel [NHANES]    Frequency of Communication with Friends and Family: More than three times a week    Frequency of Social Gatherings with Friends and Family: More than three times a week    Attends Religious Services: More than 4 times per year    Active Member of [employer] or Organizations: No    Attends Banker Meetings: Never    Marital Status: Separated    Current Medications:  Current Outpatient Medications:    acetaminophen (TYLENOL) 500 MG tablet, Take 1,000 mg by mouth every 6 (six) hours as needed for moderate pain., Disp: , Rfl:    amLODipine (NORVASC) 10 MG tablet, Take 1 tablet (10 mg total) by mouth daily., Disp: 30 tablet, Rfl: 11   atorvastatin (LIPITOR) 40 MG tablet, TAKE 1 TABLET BY MOUTH DAILY, Disp: 90 tablet, Rfl: 3   benzonatate (TESSALON PERLES) 100 MG capsule, Take 1 capsule (100 mg total) by mouth 3 (three) times  daily as needed for cough. (Patient taking differently: Take 100 mg by mouth daily as needed for cough.), Disp: 20 capsule, Rfl: 0   carvedilol (COREG) 6.25 MG tablet, TAKE 1 TABLET BY MOUTH TWICE A DAY WITH MEALS, Disp: 60 tablet, Rfl: 3   diphenhydrAMINE (BENADRYL) 50 MG tablet, Take 50 mg of Benadryl 1 hour prior to your CT scan, Disp: 1 tablet, Rfl: 0   DULoxetine (CYMBALTA) 30 MG capsule, Take 1 capsule (30 mg total) by mouth daily., Disp: 30 capsule, Rfl: 3   ferrous sulfate 325 (65 FE) MG EC tablet, Take 1 tablet (325 mg total) by mouth every other day., Disp: 15 tablet, Rfl: 2   fluticasone (FLONASE) 50 MCG/ACT nasal spray, Place 2 sprays into both nostrils daily as needed for allergies or rhinitis., Disp: , Rfl:    guaiFENesin (MUCINEX) 600 MG 12 hr tablet, Take 1 tablet (600 mg total) by mouth 2 (two) times daily., Disp: 14 tablet, Rfl: 1   Insulin Pen Needle (BD PEN NEEDLE NANO U/F) 32G X 4 MM MISC, USE TO INJECT VICTOZA ONCE DAILY, Disp: 100 each, Rfl: 1   liraglutide (VICTOZA) 18 MG/3ML SOPN, Inject 1.8 mg into the skin daily., Disp: 9 mL, Rfl: 5   metFORMIN (GLUCOPHAGE) 1000 MG tablet, Take 1 tablet (1,000 mg total) by mouth 2 (two) times daily., Disp: 180 tablet, Rfl: 3   miconazole (MICATIN) 2 % cream, Apply 1 Application topically 2 (two) times daily., Disp: 28.35 g, Rfl: 0   Multiple Vitamins-Minerals (HAIR SKIN & NAILS) TABS, Take 1 tablet by mouth daily., Disp: , Rfl:    omeprazole (PRILOSEC) 40 MG capsule, Take 1 capsule (40 mg total) by mouth daily., Disp: 30 capsule, Rfl: 1   Paliperidone ER (INVEGA SUSTENNA) injection, Inject 117 mg into the muscle every 30 (thirty) days., Disp: , Rfl:    traMADol (ULTRAM) 50 MG tablet, Take 1-2 tablets (50-100 mg total) by mouth every 6 (six) hours as needed for moderate pain., Disp: 30 tablet, Rfl: 0  Review of Systems: + Change in appetite, fatigue, hearing loss, constipation, frequency, itching. Denies fevers, chills, unexplained weight  changes. Denies neck lumps or masses, mouth sores, ringing in ears or voice changes. Denies cough or wheezing.  Denies shortness of breath. Denies chest pain or palpitations. Denies leg swelling. Denies abdominal distention, pain, blood in stools, diarrhea, nausea, vomiting, or early satiety. Denies pain with intercourse, dysuria, hematuria or incontinence. Denies hot flashes, pelvic pain, vaginal bleeding or vaginal discharge.   Denies joint pain,  back pain or muscle pain/cramps. Denies rash, or wounds. Denies dizziness, headaches, numbness or seizures. Denies swollen lymph nodes or glands, denies easy bruising or bleeding. Denies anxiety, depression, confusion, or decreased concentration.  Physical Exam: BP 110/77 (BP Location: Right Arm, Patient Position: Sitting)   Pulse 100   Temp 98.3 F (36.8 C) (Oral)   Resp (!) 81   Ht 5' 4.17" (1.63 m)   Wt 206 lb 6.4 oz (93.6 kg)   LMP 07/23/2015 (Exact Date)   SpO2 98%   BMI 35.24 kg/m  General: Alert, oriented, no acute distress. HEENT: Normocephalic, atraumatic, sclera anicteric. Chest: Unlabored breathing on room air. Abdomen: Obese, soft, nontender.  Normoactive bowel sounds.  No masses or hepatosplenomegaly appreciated.  Well-healed incisions.  Small area of skin separation at the suprapubic incision, no erythema, induration, or exudate. Extremities: Grossly normal range of motion.  Warm, well perfused.  No edema bilaterally.  Laboratory & Radiologic Studies: A. OVARY, LEFT, OOPHORECTOMY:  - Benign ovarian tissue with follicular cyst  - Negative for carcinoma   B. OVARY, RIGHT, OOPHORECTOMY:  - Benign ovarian tissue with follicular cyst  - Cross-section of benign fallopian tube  - 2 benign lymph nodes   C. COLON, PROXIMAL RIGHT, RESECTION:  - Invasive adenocarcinoma, moderately differentiated, pT3 pN0  - All margins are negative for high-grade dysplasia or carcinoma  - Twenty-eight lymph nodes negative for carcinoma (0/28)   - Benign appendix  - See oncology table   Assessment & Plan: [name] is a 52 y.o. woman with colon cancer s/p robotic BSO at the time of her recent colon surgery in the setting of a complex adnexal mass.  Bilateral ovaries benign.  Doing well psotoperative. Reviewed pathology again from my portion of the surgery.  Patient was given a copy of her pathology report.  The patient remains asymptomatic with regard to menopausal symptoms.  Discussed that as long as she does not develop bothersome or significant symptoms, I would not recommend initiating hormone replacement therapy.  I have asked her to call if she develops bothersome symptoms.    16 minutes of total time was spent for this patient encounter, including preparation, face-to-face counseling with the patient and coordination of care, and documentation of the encounter.  Eugene Garnet, MD  Division of Gynecologic Oncology  Department of Obstetrics and Gynecology  [facility]

## 2023-04-15 NOTE — Patient Instructions (Signed)
It was great to see you!  You are healing very well since surgery.  Since you are not having any menopausal symptoms, I would recommend that we do not start any hormone replacement therapy.  Please call my office at [phone] if you start developing any bothersome symptoms such as hot flashes.  Please remember, no heavy lifting for at least 6 weeks after surgery.

## 2023-04-18 ENCOUNTER — Other Ambulatory Visit: Payer: Self-pay

## 2023-04-18 ENCOUNTER — Inpatient Hospital Stay: Payer: Medicare Other | Attending: Hematology

## 2023-04-18 VITALS — BP 111/72 | HR 95 | Temp 98.4°F | Wt 207.2 lb

## 2023-04-18 DIAGNOSIS — D500 Iron deficiency anemia secondary to blood loss (chronic): Secondary | ICD-10-CM | POA: Diagnosis not present

## 2023-04-18 DIAGNOSIS — C184 Malignant neoplasm of transverse colon: Secondary | ICD-10-CM | POA: Diagnosis present

## 2023-04-18 DIAGNOSIS — D509 Iron deficiency anemia, unspecified: Secondary | ICD-10-CM

## 2023-04-18 MED ORDER — SODIUM CHLORIDE 0.9 % IV SOLN: Freq: Once | INTRAVENOUS | Status: AC

## 2023-04-18 MED ORDER — LORATADINE 10 MG PO TABS
10.0000 mg | ORAL_TABLET | Freq: Once | ORAL | Status: AC
  Administered 2023-04-18: 10 mg via ORAL
  Filled 2023-04-18: qty 1

## 2023-04-18 MED ORDER — SODIUM CHLORIDE 0.9 % IV SOLN
510.0000 mg | Freq: Once | INTRAVENOUS | Status: AC
  Administered 2023-04-18: 510 mg via INTRAVENOUS
  Filled 2023-04-18: qty 17

## 2023-04-18 NOTE — Patient Instructions (Signed)

## 2023-04-25 ENCOUNTER — Other Ambulatory Visit: Payer: Self-pay

## 2023-04-25 ENCOUNTER — Inpatient Hospital Stay: Payer: Medicare Other

## 2023-04-25 VITALS — BP 110/71 | HR 84 | Temp 98.1°F | Resp 16

## 2023-04-25 DIAGNOSIS — D509 Iron deficiency anemia, unspecified: Secondary | ICD-10-CM

## 2023-04-25 DIAGNOSIS — C184 Malignant neoplasm of transverse colon: Secondary | ICD-10-CM | POA: Diagnosis not present

## 2023-04-25 MED ORDER — SODIUM CHLORIDE 0.9 % IV SOLN: Freq: Once | INTRAVENOUS | Status: AC

## 2023-04-25 MED ORDER — LORATADINE 10 MG PO TABS
10.0000 mg | ORAL_TABLET | Freq: Once | ORAL | Status: AC
  Administered 2023-04-25: 10 mg via ORAL
  Filled 2023-04-25: qty 1

## 2023-04-25 MED ORDER — SODIUM CHLORIDE 0.9 % IV SOLN
510.0000 mg | Freq: Once | INTRAVENOUS | Status: AC
  Administered 2023-04-25: 510 mg via INTRAVENOUS
  Filled 2023-04-25: qty 510

## 2023-04-25 NOTE — Progress Notes (Signed)
Patient remained for 30 min post observation.  Tolerated treatment well.  VSS at discharge.  Ambulated to lobby.

## 2023-04-27 ENCOUNTER — Other Ambulatory Visit: Payer: Self-pay | Admitting: Student

## 2023-04-27 ENCOUNTER — Telehealth: Payer: Self-pay | Admitting: *Deleted

## 2023-04-27 DIAGNOSIS — E1121 Type 2 diabetes mellitus with diabetic nephropathy: Secondary | ICD-10-CM

## 2023-04-27 NOTE — Telephone Encounter (Signed)
Patient contacted with  her mammogram appointment and to let her know that is she do not show for this appointment she will be charged a $75.00 no show fee, if she do not call to cancel or reschedule.

## 2023-05-03 ENCOUNTER — Ambulatory Visit
Admission: RE | Admit: 2023-05-03 | Discharge: 2023-05-03 | Disposition: A | Discharge status: Home / Self Care | Payer: Medicare Other | Source: Ambulatory Visit | Attending: Internal Medicine | Admitting: Internal Medicine

## 2023-05-03 DIAGNOSIS — Z1231 Encounter for screening mammogram for malignant neoplasm of breast: Secondary | ICD-10-CM

## 2023-05-16 ENCOUNTER — Other Ambulatory Visit (HOSPITAL_COMMUNITY): Payer: Self-pay

## 2023-05-23 ENCOUNTER — Inpatient Hospital Stay: Payer: Medicare Other | Attending: Hematology

## 2023-05-23 DIAGNOSIS — D500 Iron deficiency anemia secondary to blood loss (chronic): Secondary | ICD-10-CM

## 2023-05-23 DIAGNOSIS — C184 Malignant neoplasm of transverse colon: Secondary | ICD-10-CM | POA: Diagnosis present

## 2023-05-23 LAB — CBC WITH DIFFERENTIAL (CANCER CENTER ONLY)
Abs Immature Granulocytes: 0.02 10*3/uL (ref 0.00–0.07)
Basophils Absolute: 0 10*3/uL (ref 0.0–0.1)
Basophils Relative: 0 %
Eosinophils Absolute: 0.1 10*3/uL (ref 0.0–0.5)
Eosinophils Relative: 1 %
HCT: 38.7 % (ref 36.0–46.0)
Hemoglobin: 12.3 g/dL (ref 12.0–15.0)
Immature Granulocytes: 0 %
Lymphocytes Relative: 25 %
Lymphs Abs: 1.4 10*3/uL (ref 0.7–4.0)
MCH: 25.5 pg — ABNORMAL LOW (ref 26.0–34.0)
MCHC: 31.8 g/dL (ref 30.0–36.0)
MCV: 80.3 fL (ref 80.0–100.0)
Monocytes Absolute: 0.4 10*3/uL (ref 0.1–1.0)
Monocytes Relative: 7 %
Neutro Abs: 3.8 10*3/uL (ref 1.7–7.7)
Neutrophils Relative %: 67 %
Platelet Count: 215 10*3/uL (ref 150–400)
RBC: 4.82 MIL/uL (ref 3.87–5.11)
RDW: 24 % — ABNORMAL HIGH (ref 11.5–15.5)
WBC Count: 5.7 10*3/uL (ref 4.0–10.5)
nRBC: 0 % (ref 0.0–0.2)

## 2023-05-23 LAB — CMP (CANCER CENTER ONLY)
ALT: 12 U/L (ref 0–44)
AST: 9 U/L — ABNORMAL LOW (ref 15–41)
Albumin: 4 g/dL (ref 3.5–5.0)
Alkaline Phosphatase: 76 U/L (ref 38–126)
Anion gap: 7 (ref 5–15)
BUN: 5 mg/dL — ABNORMAL LOW (ref 6–20)
CO2: 33 mmol/L — ABNORMAL HIGH (ref 22–32)
Calcium: 9.6 mg/dL (ref 8.9–10.3)
Chloride: 103 mmol/L (ref 98–111)
Creatinine: 0.68 mg/dL (ref 0.44–1.00)
GFR, Estimated: >60 mL/min (ref >60)
Glucose, Bld: 114 mg/dL — ABNORMAL HIGH (ref 70–99)
Potassium: 3.2 mmol/L — ABNORMAL LOW (ref 3.5–5.1)
Sodium: 143 mmol/L (ref 135–145)
Total Bilirubin: 0.2 mg/dL — ABNORMAL LOW (ref 0.3–1.2)
Total Protein: 7.2 g/dL (ref 6.5–8.1)

## 2023-05-23 LAB — IRON AND IRON BINDING CAPACITY (CC-WL,HP ONLY)
Iron: 104 ug/dL (ref 28–170)
Saturation Ratios: 39 % — ABNORMAL HIGH (ref 10.4–31.8)
TIBC: 267 ug/dL (ref 250–450)
UIBC: 163 ug/dL (ref 148–442)

## 2023-05-23 LAB — FERRITIN: Ferritin: 128 ng/mL (ref 11–307)

## 2023-05-30 ENCOUNTER — Other Ambulatory Visit: Payer: Self-pay | Admitting: Student

## 2023-05-30 DIAGNOSIS — I10 Essential (primary) hypertension: Secondary | ICD-10-CM

## 2023-05-30 DIAGNOSIS — E119 Type 2 diabetes mellitus without complications: Secondary | ICD-10-CM

## 2023-05-31 ENCOUNTER — Telehealth: Payer: Self-pay

## 2023-05-31 NOTE — Telephone Encounter (Signed)
Spoke with pt via telephone to inform pt that Dr. Mosetta Putt has review her recent labs.  Informed pt that her anemia has resolved and Dr. Mosetta Putt would like to for the pt to stop the oral iron Nam.  Pt verbalized understanding.  Informed pt that her K+ was low and Dr. Mosetta Putt wants to the pt to start eating foods high in K+.  Went over a list a foods high in K+.  Pt verbalized understanding and had no further questions or concerns at this time.

## 2023-06-01 NOTE — Telephone Encounter (Signed)
Pt states all of her medications were called in except for the following:   VICTOZA 18 MG/3ML SOPN  [id number] - [place], [place] - [address]

## 2023-06-06 NOTE — Telephone Encounter (Addendum)
Patient called she stated the pharmacy has a shortage for victoza at the pharmacy and is requesting the generic instead (Liraglutide)

## 2023-06-08 NOTE — Telephone Encounter (Signed)
Please advise patient stated she is out of insulin.

## 2023-06-09 ENCOUNTER — Other Ambulatory Visit: Payer: Self-pay | Admitting: Student

## 2023-06-09 ENCOUNTER — Telehealth: Payer: Self-pay

## 2023-06-09 DIAGNOSIS — E119 Type 2 diabetes mellitus without complications: Secondary | ICD-10-CM

## 2023-06-09 MED ORDER — TRULICITY 1.5 MG/0.5ML ~~LOC~~ SOAJ: 1.5000 mg | SUBCUTANEOUS | 3 refills | Status: DC

## 2023-06-09 MED ORDER — LIRAGLUTIDE 18 MG/3ML ~~LOC~~ SOPN
1.8000 mg | PEN_INJECTOR | Freq: Every day | SUBCUTANEOUS | 3 refills | Status: DC
Start: 2023-06-09 — End: 2023-06-09

## 2023-06-09 NOTE — Telephone Encounter (Signed)
Decision:Denied We received a request from your doctor for a prior  authorization on LIRAGLUTIDE 2-PAK 18 MG/3 ML.  Based on the information received, we are unable to  approve the request to have this drug covered under  your Part D benefit. The drug you asked for is not listed in your preferred  drug list (formulary). The preferred drug(s), you may  not have tried, are:  Bydureon BCise subcutaneous auto-injector  Mounjaro subcutaneous pen injector  Ozempic subcutaneous pen injector  Rybelsus tablet  Trulicity subcutaneous pen injector  Victoza 2-Pak subcutaneous pen injector  Victoza 3-Pak subcutaneous pen injector

## 2023-06-09 NOTE — Telephone Encounter (Signed)
Tried calling patient, no answer. Sent liraglutide to primary pharmacy.

## 2023-06-09 NOTE — Telephone Encounter (Signed)
Prior Authorization for patient (Liraglutide) came through on cover my meds was submitted with last office notes and labs awaiting approval or denial.

## 2023-06-30 ENCOUNTER — Inpatient Hospital Stay: Payer: Medicare Other | Admitting: Hematology

## 2023-06-30 ENCOUNTER — Inpatient Hospital Stay: Payer: Medicare Other

## 2023-06-30 NOTE — Assessment & Plan Note (Deleted)
pT3N0M0, stage II, MSS --Patient presents with iron deficient anemia since 06/2022, and received IV iron and EPO in hospital, she was referred to GI at that time. -She finally had colonoscopy and the EGD on December 14, 2022, which showed a large mass in proximal transverse colon, and a biopsy confirmed adenocarcinoma.  I discussed the findings with her in detail. -Her staging CT scan will showed a large ovarian cyst, and bilateral adrenal gland nodules, no other definitive evidence of node or distant metastasis.  CT scan of the adrenal cortical showed adrenal adenoma. -She underwent right hemicolectomy and lymph node biopsy in early May 2024, I reviewed her surgical pathology findings with patient.  She had a T3 lesion, 428 lymph nodes were negative, surgical margins were negative, no high risk features.  -she is on cancer surveillance

## 2023-07-03 NOTE — Assessment & Plan Note (Signed)
pT3N0M0, stage II, MSS --Patient presents with iron deficient anemia since 06/2022, and received IV iron and EPO in hospital, she was referred to GI at that time. -She finally had colonoscopy and the EGD on December 14, 2022, which showed a large mass in proximal transverse colon, and a biopsy confirmed adenocarcinoma.  I discussed the findings with her in detail. -Her staging CT scan will showed a large ovarian cyst, and bilateral adrenal gland nodules, no other definitive evidence of node or distant metastasis.  CT scan of the adrenal cortical showed adrenal adenoma. -She underwent right hemicolectomy and lymph node biopsy in early May 2024, I reviewed her surgical pathology findings with patient.  She had a T3 lesion, 428 lymph nodes were negative, surgical margins were negative, no high risk features.  -she is clinically doing well, lab reviewed, exam was unremarkable, there is no clinical concern for recurrence -Continue cancer surveillance, plan to repeat surveillance CT scan in February 2025.

## 2023-07-04 ENCOUNTER — Inpatient Hospital Stay (HOSPITAL_BASED_OUTPATIENT_CLINIC_OR_DEPARTMENT_OTHER): Payer: Medicare Other | Admitting: Hematology

## 2023-07-04 ENCOUNTER — Inpatient Hospital Stay: Payer: Medicare Other | Attending: Hematology

## 2023-07-04 ENCOUNTER — Encounter: Payer: Self-pay | Admitting: Hematology

## 2023-07-04 VITALS — BP 120/84 | HR 79 | Temp 98.5°F | Resp 17 | Wt 201.8 lb

## 2023-07-04 DIAGNOSIS — Z08 Encounter for follow-up examination after completed treatment for malignant neoplasm: Secondary | ICD-10-CM | POA: Insufficient documentation

## 2023-07-04 DIAGNOSIS — D509 Iron deficiency anemia, unspecified: Secondary | ICD-10-CM | POA: Insufficient documentation

## 2023-07-04 DIAGNOSIS — C184 Malignant neoplasm of transverse colon: Secondary | ICD-10-CM

## 2023-07-04 DIAGNOSIS — C183 Malignant neoplasm of hepatic flexure: Secondary | ICD-10-CM | POA: Diagnosis not present

## 2023-07-04 DIAGNOSIS — D500 Iron deficiency anemia secondary to blood loss (chronic): Secondary | ICD-10-CM

## 2023-07-04 DIAGNOSIS — Z85038 Personal history of other malignant neoplasm of large intestine: Secondary | ICD-10-CM | POA: Diagnosis present

## 2023-07-04 LAB — CBC WITH DIFFERENTIAL (CANCER CENTER ONLY)
Abs Immature Granulocytes: 0.02 10*3/uL (ref 0.00–0.07)
Basophils Absolute: 0 10*3/uL (ref 0.0–0.1)
Basophils Relative: 0 %
Eosinophils Absolute: 0 10*3/uL (ref 0.0–0.5)
Eosinophils Relative: 1 %
HCT: 39.4 % (ref 36.0–46.0)
Hemoglobin: 12.9 g/dL (ref 12.0–15.0)
Immature Granulocytes: 0 %
Lymphocytes Relative: 34 %
Lymphs Abs: 1.7 10*3/uL (ref 0.7–4.0)
MCH: 26.6 pg (ref 26.0–34.0)
MCHC: 32.7 g/dL (ref 30.0–36.0)
MCV: 81.2 fL (ref 80.0–100.0)
Monocytes Absolute: 0.4 10*3/uL (ref 0.1–1.0)
Monocytes Relative: 8 %
Neutro Abs: 2.8 10*3/uL (ref 1.7–7.7)
Neutrophils Relative %: 57 %
Platelet Count: 261 10*3/uL (ref 150–400)
RBC: 4.85 MIL/uL (ref 3.87–5.11)
RDW: 22.3 % — ABNORMAL HIGH (ref 11.5–15.5)
WBC Count: 4.9 10*3/uL (ref 4.0–10.5)
nRBC: 0 % (ref 0.0–0.2)

## 2023-07-04 LAB — IRON AND IRON BINDING CAPACITY (CC-WL,HP ONLY)
Iron: 92 ug/dL (ref 28–170)
Saturation Ratios: 33 % — ABNORMAL HIGH (ref 10.4–31.8)
TIBC: 283 ug/dL (ref 250–450)
UIBC: 191 ug/dL (ref 148–442)

## 2023-07-04 LAB — CMP (CANCER CENTER ONLY)
ALT: 11 U/L (ref 0–44)
AST: 9 U/L — ABNORMAL LOW (ref 15–41)
Albumin: 4.3 g/dL (ref 3.5–5.0)
Alkaline Phosphatase: 74 U/L (ref 38–126)
Anion gap: 5 (ref 5–15)
BUN: 7 mg/dL (ref 6–20)
CO2: 33 mmol/L — ABNORMAL HIGH (ref 22–32)
Calcium: 9.8 mg/dL (ref 8.9–10.3)
Chloride: 105 mmol/L (ref 98–111)
Creatinine: 0.58 mg/dL (ref 0.44–1.00)
GFR, Estimated: >60 mL/min (ref >60)
Glucose, Bld: 107 mg/dL — ABNORMAL HIGH (ref 70–99)
Potassium: 3.7 mmol/L (ref 3.5–5.1)
Sodium: 143 mmol/L (ref 135–145)
Total Bilirubin: 0.3 mg/dL (ref 0.3–1.2)
Total Protein: 7.4 g/dL (ref 6.5–8.1)

## 2023-07-04 LAB — CEA (ACCESS): CEA (CHCC): 1.26 ng/mL (ref 0.00–5.00)

## 2023-07-04 LAB — FERRITIN: Ferritin: 69 ng/mL (ref 11–307)

## 2023-07-04 NOTE — Progress Notes (Signed)
[facility]   [phone] [phone]   Clinic Follow up Note   Patient Care Team: Marrianne Mood, MD as PCP - General Hanley Seamen, Dustin Folks, MD as Referring Physician (Optometry) Malachy Mood, MD as Consulting Physician (Oncology) Karie Soda, MD as Consulting Physician (General Surgery) Armbruster, Willaim Rayas, MD as Consulting Physician (Gastroenterology) Carver Fila, MD as Consulting Physician (Gynecologic Oncology)  Date of Service:  07/04/2023  CHIEF COMPLAINT: f/u of Colon Cancer  CURRENT THERAPY:  Feraheme q7d  ASSESSMENT:  [name] is a 52 y.o. female with   Primary cancer of hepatic flexure of colon (HCC) pT3N0M0, stage II, MSS --Patient presents with iron deficient anemia since 06/2022, and received IV iron and EPO in hospital, she was referred to GI at that time. -She finally had colonoscopy and the EGD on December 14, 2022, which showed a large mass in proximal transverse colon, and a biopsy confirmed adenocarcinoma.  I discussed the findings with her in detail. -Her staging CT scan will showed a large ovarian cyst, and bilateral adrenal gland nodules, no other definitive evidence of node or distant metastasis.  CT scan of the adrenal cortical showed adrenal adenoma. -She underwent right hemicolectomy and lymph node biopsy in early May 2024, I reviewed her surgical pathology findings with patient.  She had a T3 lesion, 428 lymph nodes were negative, surgical margins were negative, no high risk features.  -she is clinically doing well, lab reviewed, exam was unremarkable, there is no clinical concern for recurrence -Continue cancer surveillance, plan to repeat surveillance CT scan in February 2025.    PLAN: -repeat CT scan in 6 months -lab reviewed -lab and f/u in 3 months   SUMMARY OF ONCOLOGIC HISTORY: Oncology History  Primary cancer of hepatic flexure of colon (HCC)  12/21/2022 Imaging    IMPRESSION: 1. Colon is  minimally distended and no discrete colonic lesion is identified to correspond with patient's given history of colonic neoplasm. 2. Bilateral adrenal nodules measuring 12 mm in the right adrenal gland and 1 cm in the left adrenal gland are new from CT August 20 2015. These likely reflect benign adrenal adenomas however they are technically indeterminate and small metastatic lesions are not excluded, consider more definitive characterization with pre and postcontrast adrenal protocol CT or MRI versus attention on follow-up oncologic imaging. 3. Tiny bilateral pulmonary nodules along the major fissures measuring up to 2 mm are nonspecific but favored to reflect intrapulmonary lymph nodes. However, given lack of prior imaging for comparison these warrant attention on short-term interval follow-up dedicated chest CT. 4. 7.7 cm left ovarian cyst, recommend further evaluation by dedicated pelvic ultrasound. 5. Nonobstructive bilateral renal stones measure up to 4 mm in the right kidney.   12/26/2022 Initial Diagnosis   Cancer of transverse colon (HCC)   03/24/2023 Cancer Staging   Staging form: Colon and Rectum, AJCC 8th Edition - Pathologic stage from 03/24/2023: Stage IIA (pT3, pN0, cM0) - Signed by Malachy Mood, MD on 04/05/2023 Total positive nodes: 0 Histologic grading system: 4 grade system Histologic grade (G): G2 Residual tumor (R): R0 - None      INTERVAL HISTORY:  [name] is here for a follow up of  Colon Cancer. She was last seen by me on 04/06/2023. She presents to the clinic alone.Pt state that she feels good and she denies having any abdominal issues. Her appetite is good.   All other systems were reviewed with the patient and are negative.  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 US Neck 11/06/14: Bilateral nodules. Dominant right lower pole nodule measures 2.3 cm. Findings meet consensus criteria for biopsy. Ultrasound-guided fine needle aspiration should be considered Aspiration pathology: benign follicular nodule   Transfusion of blood product refused for religious reason 12/28/2022    SURGICAL HISTORY: Past Surgical History:  Procedure Laterality Date   CESAREAN SECTION     EXPLORATORY LAPAROTOMY WITH ABDOMINAL MASS EXCISION     MYOMECTOMY N/A 08/05/2015   Procedure: Abdominal Hysterectomy, Bilateral Salpingectomy;  Surgeon: Tereso Newcomer, MD;  Location: [facility];  Service: Gynecology;  Laterality: N/A;   ROBOTIC ASSISTED SALPINGO OOPHERECTOMY Bilateral 03/24/2023   Procedure: XI ROBOTIC ASSISTED  BILATERAL  OOPHORECTOMY, LYSIS OF ADHESIONS;  Surgeon: Carver Fila, MD;  Location: [facility];  Service: Gynecology;  Laterality: Bilateral;    I have reviewed the social history and family history with the patient and they are unchanged from previous note.  ALLERGIES:  is allergic to bactrim [sulfamethoxazole-trimethoprim], lisinopril, losartan, bee pollen, ivp dye [iodinated contrast media], metrizamide, pollen extract, sulfa antibiotics, sulfasalazine, other, and peanut-containing drug products.  MEDICATIONS:  Current Outpatient Medications  Medication Sig Dispense Refill   acetaminophen (TYLENOL) 500 MG tablet Take 1,000 mg by mouth every 6 (six) hours as needed for moderate pain.     amLODipine (NORVASC) 10 MG tablet Take 1 tablet (10 mg total) by mouth daily.  30 tablet 11   atorvastatin (LIPITOR) 40 MG tablet TAKE 1 TABLET BY MOUTH DAILY 90 tablet 3   benzonatate (TESSALON PERLES) 100 MG capsule Take 1 capsule (100 mg total) by mouth 3 (three) times daily as needed for cough. (Patient taking differently: Take 100 mg by mouth daily as needed for cough.) 20 capsule 0   carvedilol (COREG) 6.25 MG tablet TAKE 1 TABLET BY MOUTH TWICE A DAY WITH MEALS 60 tablet 3   diphenhydrAMINE (BENADRYL) 50 MG tablet Take 50 mg of Benadryl 1 hour prior to your CT scan 1 tablet 0   Dulaglutide (TRULICITY) 1.5 MG/0.5ML SOPN Inject 1.5 mg into the skin once a week. 2 mL 3   DULoxetine (CYMBALTA) 30 MG capsule Take 1 capsule (30 mg total) by mouth daily. 30 capsule 3   ferrous sulfate 325 (65 FE) MG EC tablet Take 1 tablet (325 mg total) by mouth every other day. 15 tablet 2   fluticasone (FLONASE) 50 MCG/ACT nasal spray Place 2 sprays into both nostrils daily as needed for allergies or rhinitis.     guaiFENesin (MUCINEX) 600 MG 12 hr tablet Take 1 tablet (600 mg total) by mouth 2 (two) times daily. 14 tablet 1   Insulin Pen Needle (BD PEN NEEDLE NANO U/F) 32G X 4 MM MISC USE TO INJECT  VICTOZA ONCE DAILY 100 each 1   metFORMIN (GLUCOPHAGE) 1000 MG tablet Take 1 tablet (1,000 mg total) by mouth 2 (two) times daily. 180 tablet 3   miconazole (MICATIN) 2 % cream Apply 1 Application topically 2 (two) times daily. 28.35 g 0   Multiple Vitamins-Minerals (HAIR SKIN & NAILS) TABS Take 1 tablet by mouth daily.     omeprazole (PRILOSEC) 40 MG capsule Take 1 capsule (40 mg total) by mouth daily. 30 capsule 1   Paliperidone ER (INVEGA SUSTENNA) injection Inject 117 mg into the muscle every 30 (thirty) days.     traMADol (ULTRAM) 50 MG tablet Take 1-2 tablets (50-100 mg total) by mouth every 6 (six) hours as needed for moderate pain. 30 tablet 0   No current facility-administered medications for this visit.    PHYSICAL EXAMINATION: ECOG PERFORMANCE STATUS: 0 - Asymptomatic  Vitals:    07/04/23 1025  BP: 120/84  Pulse: 79  Resp: 17  Temp: 98.5 F (36.9 C)  SpO2: 97%   Wt Readings from Last 3 Encounters:  07/04/23 201 lb 12.8 oz (91.5 kg)  04/18/23 207 lb 4 oz (94 kg)  04/15/23 206 lb 6.4 oz (93.6 kg)     GENERAL:alert, no distress and comfortable SKIN: skin color normal, no rashes or significant lesions EYES: normal, Conjunctiva are pink and non-injected, sclera clear  NEURO: alert & oriented x 3 with fluent speech LUNGS: (-)clear to auscultation and percussion with normal breathing effort HEART: (-)regular rate & rhythm and no murmurs and no lower extremity edema ABDOMEN(-):abdomen soft, (-) non-tender and normal bowel sounds   LABORATORY DATA:  I have reviewed the data as listed    Latest Ref Rng & Units 07/04/2023    9:55 AM 05/23/2023   12:55 PM 04/06/2023    2:15 PM  CBC  WBC 4.0 - 10.5 K/uL 4.9  5.7  6.3   Hemoglobin 12.0 - 15.0 g/dL 27.2  53.6  7.0   Hematocrit 36.0 - 46.0 % 39.4  38.7  22.6   Platelets 150 - 400 K/uL 261  215  475         Latest Ref Rng & Units 07/04/2023    9:55 AM 05/23/2023   12:55 PM 04/06/2023    2:10 PM  CMP  Glucose 70 - 99 mg/dL 644  034  742   BUN 6 - 20 mg/dL 7  5  6    Creatinine 0.44 - 1.00 mg/dL 5.95  6.38  7.56   Sodium 135 - 145 mmol/L 143  143  141   Potassium 3.5 - 5.1 mmol/L 3.7  3.2  3.5   Chloride 98 - 111 mmol/L 105  103  104   CO2 22 - 32 mmol/L 33  33  29   Calcium 8.9 - 10.3 mg/dL 9.8  9.6  9.0   Total Protein 6.5 - 8.1 g/dL 7.4  7.2  6.5   Total Bilirubin 0.3 - 1.2 mg/dL 0.3  0.2  0.4   Alkaline Phos 38 - 126 U/L 74  76  53   AST 15 - 41 U/L 9  9  9    ALT 0 - 44 U/L 11  12  9        RADIOGRAPHIC STUDIES: I have personally reviewed the radiological images as listed and agreed with the findings in the report. No results found.    No orders of the defined types were placed in this encounter.  All questions were answered. The patient knows to  call the clinic with any problems, questions or  concerns. No barriers to learning was detected. The total time spent in the appointment was 20 minutes.     Malachy Mood, MD 07/04/2023   Carolin Coy, CMA, am acting as scribe for Malachy Mood, MD.   I have reviewed the above documentation for accuracy and completeness, and I agree with the above.

## 2023-07-15 ENCOUNTER — Ambulatory Visit: Payer: Medicare Other

## 2023-07-15 ENCOUNTER — Ambulatory Visit: Payer: Medicare Other | Admitting: Student

## 2023-07-15 ENCOUNTER — Encounter: Payer: Self-pay | Admitting: Student

## 2023-07-15 VITALS — BP 127/91 | HR 76 | Temp 97.6°F | Ht 64.7 in

## 2023-07-15 VITALS — BP 127/91 | HR 76 | Temp 97.6°F | Ht 64.7 in | Wt 202.0 lb

## 2023-07-15 DIAGNOSIS — Z0000 Encounter for general adult medical examination without abnormal findings: Secondary | ICD-10-CM

## 2023-07-15 DIAGNOSIS — E785 Hyperlipidemia, unspecified: Secondary | ICD-10-CM | POA: Diagnosis not present

## 2023-07-15 DIAGNOSIS — Z7984 Long term (current) use of oral hypoglycemic drugs: Secondary | ICD-10-CM

## 2023-07-15 DIAGNOSIS — Z794 Long term (current) use of insulin: Secondary | ICD-10-CM

## 2023-07-15 DIAGNOSIS — E119 Type 2 diabetes mellitus without complications: Secondary | ICD-10-CM

## 2023-07-15 LAB — POCT GLYCOSYLATED HEMOGLOBIN (HGB A1C): Hemoglobin A1C: 6.9 % — AB (ref 4.0–5.6)

## 2023-07-15 LAB — GLUCOSE, CAPILLARY: Glucose-Capillary: 89 mg/dL (ref 70–99)

## 2023-07-15 MED ORDER — TRULICITY 1.5 MG/0.5ML ~~LOC~~ SOAJ
3.0000 mg | SUBCUTANEOUS | 3 refills | Status: DC
Start: 2023-07-15 — End: 2023-09-21

## 2023-07-15 NOTE — Assessment & Plan Note (Signed)
A1c elevated to 6.9% from 6.0. Her current regimen is metformin 1000mg  BID, and Trulicity 1.5mg  weekly. She is compliant with her regimen. Will increase trulicity to 3mg  weekly. She denies symptoms of polyuria, polydipsia.  Plan:  - Increase trulicity to 3.0mg  weekly  - Continue metofrmin 1000mg  BID - Urine albumin creatinine done today

## 2023-07-15 NOTE — Assessment & Plan Note (Signed)
LDL goal at 30, will recheck today. Pt already on atorvastatin 40mg , will continue.

## 2023-07-15 NOTE — Patient Instructions (Addendum)
Thank you so much for coming to the clinic today!   We are checking your cholesterol and your urine today, I will give you a call whenever the results come back, or send you a message! We will also increase your trulicity to 3mg  a week, from 1.5.   If you have any questions please feel free to the call the clinic at anytime at [phone]. It was a pleasure seeing you!  Best, Dr. Thomasene Ripple

## 2023-07-15 NOTE — Assessment & Plan Note (Signed)
Paperwork completed for transportation.

## 2023-07-15 NOTE — Progress Notes (Signed)
CC: A1c checkup  HPI:  [name] is a 52 y.o. female living with a history stated below and presents today for checkup. Please see problem based assessment and plan for additional details.  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 US Neck 11/06/14: Bilateral nodules. Dominant right lower pole nodule measures 2.3 cm. Findings meet consensus criteria for biopsy. Ultrasound-guided fine needle aspiration should be considered Aspiration pathology: benign follicular nodule   Transfusion of blood product refused for religious reason 12/28/2022    Current Outpatient Medications on File Prior to Visit  Medication Sig Dispense Refill   acetaminophen (TYLENOL) 500 MG tablet Take 1,000 mg by mouth every 6 (six) hours as needed for moderate pain.     amLODipine (NORVASC) 10 MG tablet Take 1 tablet (10 mg total) by mouth daily. 30 tablet 11   atorvastatin (LIPITOR) 40 MG tablet TAKE 1 TABLET BY MOUTH DAILY 90 tablet 3   benzonatate (TESSALON PERLES) 100 MG capsule Take 1 capsule (100 mg total) by mouth 3 (three) times daily as needed for cough. (Patient taking differently: Take 100 mg by mouth daily as needed for cough.) 20 capsule 0   carvedilol (COREG) 6.25 MG tablet TAKE 1 TABLET BY MOUTH TWICE A DAY WITH MEALS 60 tablet 3   diphenhydrAMINE (BENADRYL) 50 MG tablet Take 50 mg of Benadryl 1 hour prior to your CT scan 1  tablet 0   DULoxetine (CYMBALTA) 30 MG capsule Take 1 capsule (30 mg total) by mouth daily. 30 capsule 3   ferrous sulfate 325 (65 FE) MG EC tablet Take 1 tablet (325 mg total) by mouth every other day. 15 tablet 2   fluticasone (FLONASE) 50 MCG/ACT nasal spray Place 2 sprays into both nostrils daily as needed for allergies or rhinitis.     guaiFENesin (MUCINEX) 600 MG 12 hr tablet Take 1 tablet (600 mg total) by mouth 2 (two) times daily. 14 tablet 1   Insulin Pen Needle (BD PEN NEEDLE NANO U/F) 32G X 4 MM MISC USE TO INJECT VICTOZA ONCE DAILY 100 each 1   metFORMIN (GLUCOPHAGE) 1000 MG tablet Take 1 tablet (1,000 mg total) by mouth 2 (two) times daily. 180 tablet 3   miconazole (MICATIN) 2 % cream Apply 1 Application topically 2 (two) times daily. 28.35 g 0   Multiple Vitamins-Minerals (HAIR SKIN & NAILS) TABS Take 1 tablet by mouth daily.     omeprazole (PRILOSEC) 40 MG capsule Take 1 capsule (40 mg total) by mouth daily. 30 capsule 1   Paliperidone ER (INVEGA SUSTENNA) injection Inject 117 mg into the muscle every 30 (thirty) days.     No current facility-administered medications on file prior to visit.    Family History  Problem Relation Age of  Onset   Diabetes Mother    Breast cancer Mother    Cancer Mother 2       breast cancer   Heart failure Mother    Hypertension Mother    Diabetes Mellitus II Mother    Cancer Father 50       colon cancer   Diabetes Mellitus II Father    Hypertension Father    Cancer Maternal Aunt 85       breast cancer   Cancer Maternal Grandmother        lung cancer   Colon cancer Neg Hx    Esophageal cancer Neg Hx    Pancreatic cancer Neg Hx    Ulcerative colitis Neg Hx     Social History   Socioeconomic History   Marital status: Married    Spouse name: Not on file   Number of children: 1   Years of education: Not on file   Highest education level: Not on file  Occupational History   Not on file  Tobacco Use   Smoking status: Never    Smokeless tobacco: Never  Vaping Use   Vaping status: Never Used  Substance and Sexual Activity   Alcohol use: No   Drug use: No   Sexual activity: Not Currently    Birth control/protection: Surgical  Other Topics Concern   Not on file  Social History Narrative   Not on file   Social Determinants of Health   Financial Resource Strain: Medium Risk (07/15/2023)   Overall Financial Resource Strain (CARDIA)    Difficulty of Paying Living Expenses: Somewhat hard  Food Insecurity: Food Insecurity Present (07/15/2023)   Hunger Vital Sign    Worried About Running Out of Food in the Last Year: Sometimes true    Ran Out of Food in the Last Year: Sometimes true  Transportation Needs: No Transportation Needs (07/15/2023)   PRAPARE - Administrator, Civil Service (Medical): No    Lack of Transportation (Non-Medical): No  Physical Activity: Inactive (07/15/2023)   Exercise Vital Sign    Days of Exercise per Week: 0 days    Minutes of Exercise per Session: 0 min  Stress: No Stress Concern Present (07/15/2023)   [employer] of Occupational Health - Occupational Stress Questionnaire    Feeling of Stress : Only a little  Social Connections: Moderately Isolated (07/15/2023)   Social Connection and Isolation Panel [NHANES]    Frequency of Communication with Friends and Family: Twice a week    Frequency of Social Gatherings with Friends and Family: Once a week    Attends Religious Services: More than 4 times per year    Active Member of [employer] or Organizations: No    Attends Banker Meetings: Never    Marital Status: Separated  Intimate Partner Violence: Not At Risk (07/15/2023)   Humiliation, Afraid, Rape, and Kick questionnaire    Fear of Current or Ex-Partner: No    Emotionally Abused: No    Physically Abused: No    Sexually Abused: No    Review of Systems: ROS negative except for what is noted on the assessment and plan.  Vitals:   07/15/23 0920  BP: (!)  127/91  Pulse: 76  Temp: 97.6 F (36.4 C)  TempSrc: Oral  SpO2: 100%  Height: 5' 4.7" (1.643 m)    Physical Exam: Constitutional: well-appearing female  in no acute distress, hard of hearing Cardiovascular: regular rate and rhythm, no m/r/g Pulmonary/Chest: normal work of breathing  on room air, lungs clear to auscultation bilaterally Abdominal: soft, non-tender, non-distended MSK: normal bulk and tone Neurological: alert & oriented x 3, 5/5 strength in bilateral upper and lower extremities, normal gait   Assessment & Plan:   Healthcare maintenance Paperwork completed for transportation.   Diabetes mellitus type 2, noninsulin dependent (HCC) A1c elevated to 6.9% from 6.0. Her current regimen is metformin 1000mg  BID, and Trulicity 1.5mg  weekly. She is compliant with her regimen. Will increase trulicity to 3mg  weekly. She denies symptoms of polyuria, polydipsia.  Plan:  - Increase trulicity to 3.0mg  weekly  - Continue metofrmin 1000mg  BID - Urine albumin creatinine done today    Hyperlipidemia LDL goal <70 LDL goal at 30, will recheck today. Pt already on atorvastatin 40mg , will continue.   Patient discussed with Dr. Kae Heller Damante Spragg, M.D. [facility], PGY-2 Pager: [phone] Date 07/15/2023 Time 3:14 PM

## 2023-07-15 NOTE — Progress Notes (Addendum)
Subjective:   [name] is a 52 y.o. female who presents for Medicare Annual (Subsequent) preventive examination.  Visit Complete: In person  Patient Medicare AWV questionnaire was completed by the patient on 07/15/2023; I have confirmed that all information answered by patient is correct and no changes since this date.  Review of Systems    Defer to PCP       Objective:    Today's Vitals   07/15/23 0945  BP: (!) 127/91  Pulse: 76  Temp: 97.6 F (36.4 C)  TempSrc: Oral  SpO2: 100%  Weight: 202 lb (91.6 kg)  Height: 5' 4.7" (1.643 m)   Body mass index is 33.93 kg/m.     07/15/2023    9:46 AM 07/15/2023    9:19 AM 04/18/2023    3:24 PM 04/12/2023    9:46 AM 03/24/2023    5:52 AM 03/15/2023   11:35 AM 02/04/2023   11:36 AM  Advanced Directives  Does Patient Have a Medical Advance Directive? No No Yes Yes Yes Yes Yes  Type of Pension scheme manager Power of [employer] Power of Attorney   Does patient want to make changes to medical advance directive?    No - Patient declined No - Patient declined    Copy of Healthcare Power of Attorney in Chart?   Yes - validated most recent copy scanned in chart (See row information) Yes - validated most recent copy scanned in chart (See row information) Yes - validated most recent copy scanned in chart (See row information) Yes - validated most recent copy scanned in chart (See row information)   Would patient like information on creating a medical advance directive? No - Patient declined No - Patient declined         Current Medications (verified) Outpatient Encounter Medications as of 07/15/2023  Medication Sig   acetaminophen (TYLENOL) 500 MG tablet Take 1,000 mg by mouth every 6 (six) hours as needed for moderate pain.   amLODipine (NORVASC) 10 MG tablet Take 1 tablet (10 mg total) by mouth daily.   atorvastatin (LIPITOR) 40 MG tablet TAKE 1 TABLET BY MOUTH  DAILY   benzonatate (TESSALON PERLES) 100 MG capsule Take 1 capsule (100 mg total) by mouth 3 (three) times daily as needed for cough. (Patient taking differently: Take 100 mg by mouth daily as needed for cough.)   carvedilol (COREG) 6.25 MG tablet TAKE 1 TABLET BY MOUTH TWICE A DAY WITH MEALS   diphenhydrAMINE (BENADRYL) 50 MG tablet Take 50 mg of Benadryl 1 hour prior to your CT scan   Dulaglutide (TRULICITY) 1.5 MG/0.5ML SOPN Inject 1.5 mg into the skin once a week.   DULoxetine (CYMBALTA) 30 MG capsule Take 1 capsule (30 mg total) by mouth daily.   ferrous sulfate 325 (65 FE) MG EC tablet Take 1 tablet (325 mg total) by mouth every other day.   fluticasone (FLONASE) 50 MCG/ACT nasal spray Place 2 sprays into both nostrils daily as needed for allergies or rhinitis.   guaiFENesin (MUCINEX) 600 MG 12 hr tablet Take 1 tablet (600 mg total) by mouth 2 (two) times daily.   Insulin Pen Needle (BD PEN NEEDLE NANO U/F) 32G X 4 MM MISC USE TO INJECT VICTOZA ONCE DAILY   metFORMIN (GLUCOPHAGE) 1000 MG tablet Take 1 tablet (1,000 mg total) by mouth 2 (two) times daily.   miconazole (MICATIN) 2 % cream Apply 1 Application topically 2 (two) times  daily.   Multiple Vitamins-Minerals (HAIR SKIN & NAILS) TABS Take 1 tablet by mouth daily.   omeprazole (PRILOSEC) 40 MG capsule Take 1 capsule (40 mg total) by mouth daily.   Paliperidone ER (INVEGA SUSTENNA) injection Inject 117 mg into the muscle every 30 (thirty) days.   traMADol (ULTRAM) 50 MG tablet Take 1-2 tablets (50-100 mg total) by mouth every 6 (six) hours as needed for moderate pain.   No facility-administered encounter medications on file as of 07/15/2023.    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 US Neck 11/06/14: Bilateral nodules. Dominant right lower pole nodule measures 2.3 cm. Findings meet consensus criteria for biopsy. Ultrasound-guided fine needle aspiration should be considered Aspiration pathology: benign follicular nodule   Transfusion of blood product refused for religious reason 12/28/2022   Past Surgical History:  Procedure Laterality Date   CESAREAN SECTION     EXPLORATORY LAPAROTOMY WITH ABDOMINAL MASS EXCISION     MYOMECTOMY N/A 08/05/2015   Procedure: Abdominal Hysterectomy, Bilateral Salpingectomy;  Surgeon: Tereso Newcomer, MD;  Location: [facility];  Service: Gynecology;  Laterality: N/A;   ROBOTIC ASSISTED SALPINGO OOPHERECTOMY Bilateral 03/24/2023   Procedure: XI ROBOTIC ASSISTED  BILATERAL  OOPHORECTOMY, LYSIS OF ADHESIONS;  Surgeon: Carver Fila, MD;  Location: [facility];  Service: Gynecology;  Laterality: Bilateral;   Family History  Problem Relation Age of Onset   Diabetes Mother    Breast cancer Mother    Cancer Mother 57       breast cancer   Heart failure Mother    Hypertension Mother    Diabetes Mellitus II Mother    Cancer Father 53       colon cancer   Diabetes Mellitus II Father    Hypertension Father    Cancer Maternal Aunt 26       breast cancer   Cancer Maternal Grandmother        lung cancer   Colon cancer Neg Hx    Esophageal cancer Neg Hx    Pancreatic cancer Neg Hx     Ulcerative colitis Neg Hx    Social History   Socioeconomic History   Marital status: Married    Spouse name: Not on file   Number of children: 1   Years of education: Not on file   Highest education level: Not on file  Occupational History   Not on file  Tobacco Use   Smoking status: Never   Smokeless tobacco: Never  Vaping Use   Vaping status: Never Used  Substance and Sexual Activity   Alcohol use: No  Drug use: No   Sexual activity: Not Currently    Birth control/protection: Surgical  Other Topics Concern   Not on file  Social History Narrative   Not on file   Social Determinants of Health   Financial Resource Strain: Medium Risk (07/15/2023)   Overall Financial Resource Strain (CARDIA)    Difficulty of Paying Living Expenses: Somewhat hard  Food Insecurity: Food Insecurity Present (07/15/2023)   Hunger Vital Sign    Worried About Running Out of Food in the Last Year: Sometimes true    Ran Out of Food in the Last Year: Sometimes true  Transportation Needs: No Transportation Needs (07/15/2023)   PRAPARE - Administrator, Civil Service (Medical): No    Lack of Transportation (Non-Medical): No  Physical Activity: Inactive (07/15/2023)   Exercise Vital Sign    Days of Exercise per Week: 0 days    Minutes of Exercise per Session: 0 min  Stress: No Stress Concern Present (07/15/2023)   [employer] of Occupational Health - Occupational Stress Questionnaire    Feeling of Stress : Only a little  Social Connections: Moderately Isolated (07/15/2023)   Social Connection and Isolation Panel [NHANES]    Frequency of Communication with Friends and Family: Twice a week    Frequency of Social Gatherings with Friends and Family: Once a week    Attends Religious Services: More than 4 times per year    Active Member of [employer] or Organizations: No    Attends Banker Meetings: Never    Marital Status: Separated    Tobacco Counseling Counseling  given: Not Answered   Clinical Intake:  Pre-visit preparation completed: Yes  Pain : No/denies pain     Nutritional Risks: None Diabetes: Yes CBG done?: Yes CBG resulted in Enter/ Edit results?: Yes Did pt. bring in CBG monitor from home?: No  How often do you need to have someone help you when you read instructions, pamphlets, or other written materials from your doctor or pharmacy?: 1 - Never What is the last grade level you completed in school?: 12th grade  Interpreter Needed?: No  Information entered by :: Kaynan Klonowski,cma   Activities of Daily Living    07/15/2023    9:46 AM 07/15/2023    9:19 AM  In your present state of health, do you have any difficulty performing the following activities:  Hearing? 1 1  Vision? 0 0  Difficulty concentrating or making decisions? 0 0  Walking or climbing stairs? 0 0  Dressing or bathing? 0 0  Doing errands, shopping? 0 0    Patient Care Team: Marrianne Mood, MD as PCP - General Hanley Seamen, Dustin Folks, MD as Referring Physician (Optometry) Malachy Mood, MD as Consulting Physician (Oncology) Karie Soda, MD as Consulting Physician (General Surgery) Armbruster, Willaim Rayas, MD as Consulting Physician (Gastroenterology) Carver Fila, MD as Consulting Physician (Gynecologic Oncology)  Indicate any recent Medical Services you may have received from other than [facility] providers in the past year (date may be approximate).     Assessment:   This is a routine wellness examination for Ayse.  Hearing/Vision screen No results found.  Dietary issues and exercise activities discussed:     Goals Addressed   None   Depression Screen    07/15/2023    9:47 AM 07/15/2023    9:19 AM 04/12/2023    9:45 AM 12/03/2022   10:45 AM 07/12/2022    8:40 AM 04/16/2022    1:24 PM 12/17/2021  9:35 AM  PHQ 2/9 Scores  PHQ - 2 Score 0 0 0 1 2 2  0  PHQ- 9 Score    8 10 7      Fall Risk    07/15/2023    9:46 AM 07/15/2023    9:19 AM  12/03/2022   10:45 AM 11/17/2022    2:49 PM 07/12/2022    8:31 AM  Fall Risk   Falls in the past year? 0 1 0 0 1  Number falls in past yr: 0 0  0 1  Injury with Fall? 0 0  0 1  Risk for fall due to : No Fall Risks No Fall Risks No Fall Risks  History of fall(s);Impaired balance/gait  Risk for fall due to: Comment     Anemia  Follow up Falls evaluation completed;Falls prevention discussed Falls evaluation completed;Falls prevention discussed Falls evaluation completed Falls evaluation completed Falls evaluation completed;Falls prevention discussed    MEDICARE RISK AT HOME: Medicare Risk at Home Any stairs in or around the home?: Yes If so, are there any without handrails?: No Home free of loose throw rugs in walkways, pet beds, electrical cords, etc?: Yes Adequate lighting in your home to reduce risk of falls?: Yes Life alert?: No Use of a cane, walker or w/c?: No Grab bars in the bathroom?: Yes Shower chair or bench in shower?: No Elevated toilet seat or a handicapped toilet?: No  TIMED UP AND GO:  Was the test performed?  No    Cognitive Function:        07/15/2023    9:47 AM 04/16/2022    1:29 PM  6CIT Screen  What Year? 0 points 0 points  What month? 0 points 3 points  What time? 0 points 0 points  Count back from 20 0 points 0 points  Months in reverse 0 points 0 points  Repeat phrase 0 points 0 points  Total Score 0 points 3 points    Immunizations Immunization History  Administered Date(s) Administered   Influenza,inj,Quad PF,6+ Mos 07/30/2014, 08/07/2015, 08/02/2016, 09/26/2018, 11/12/2020, 08/24/2021, 12/03/2022   PFIZER(Purple Top)SARS-COV-2 Vaccination 06/02/2020, 07/26/2020   Pneumococcal Polysaccharide-23 02/19/2021   Tdap 09/10/2014    TDAP status: Up to date  Flu Vaccine status: Due, Education has been provided regarding the importance of this vaccine. Advised may receive this vaccine at local pharmacy or Health Dept. Aware to provide a copy of the  vaccination record if obtained from local pharmacy or Health Dept. Verbalized acceptance and understanding.  Pneumococcal vaccine status: Due, Education has been provided regarding the importance of this vaccine. Advised may receive this vaccine at local pharmacy or Health Dept. Aware to provide a copy of the vaccination record if obtained from local pharmacy or Health Dept. Verbalized acceptance and understanding.  Covid-19 vaccine status: Completed vaccines  Qualifies for Shingles Vaccine? No   Zostavax completed No   Shingrix Completed?: No.    Education has been provided regarding the importance of this vaccine. Patient has been advised to call insurance company to determine out of pocket expense if they have not yet received this vaccine. Advised may also receive vaccine at local pharmacy or Health Dept. Verbalized acceptance and understanding.  Screening Tests Health Maintenance  Topic Date Due   Hepatitis C Screening  Never done   OPHTHALMOLOGY EXAM  12/27/2017   COVID-19 Vaccine (3 - Pfizer risk series) 08/23/2020   HEMOGLOBIN A1C  05/07/2023   Diabetic kidney evaluation - Urine ACR  07/13/2023   LIPID PANEL  07/13/2023   INFLUENZA VACCINE  06/16/2023   FOOT EXAM  10/02/2023   Colonoscopy  12/15/2023   Diabetic kidney evaluation - eGFR measurement  07/03/2024   Medicare Annual Wellness (AWV)  07/14/2024   DTaP/Tdap/Td (2 - Td or Tdap) 09/10/2024   PAP SMEAR-Modifier  11/12/2025   HIV Screening  Completed   HPV VACCINES  Aged Out    Health Maintenance  Health Maintenance Due  Topic Date Due   Hepatitis C Screening  Never done   OPHTHALMOLOGY EXAM  12/27/2017   COVID-19 Vaccine (3 - Pfizer risk series) 08/23/2020   HEMOGLOBIN A1C  05/07/2023   Diabetic kidney evaluation - Urine ACR  07/13/2023   LIPID PANEL  07/13/2023   INFLUENZA VACCINE  06/16/2023    Colorectal cancer screening: Type of screening: Colonoscopy. Completed 12/14/2022. Repeat every 1 years  Mammogram  status: Completed 05/06/2023. Repeat every year   Lung Cancer Screening: (Low Dose CT Chest recommended if Age 68-80 years, 20 pack-year currently smoking OR have quit w/in 15years.) does not qualify.   Lung Cancer Screening Referral: N/A  Additional Screening:  Hepatitis C Screening: does not qualify; Completed N/A  Vision Screening: Recommended annual ophthalmology exams for early detection of glaucoma and other disorders of the eye. Is the patient up to date with their annual eye exam?  Yes  Who is the provider or what is the name of the office in which the patient attends annual eye exams? N/A If pt is not established with a provider, would they like to be referred to a provider to establish care? No .   Dental Screening: Recommended annual dental exams for proper oral hygiene  Diabetic Foot Exam: Diabetic Foot Exam: Completed 10/01/2022  Community Resource Referral / Chronic Care Management: CRR required this visit?  No   CCM required this visit?  No     Plan:     I have personally reviewed and noted the following in the patient's chart:   Medical and social history Use of alcohol, tobacco or illicit drugs  Current medications and supplements including opioid prescriptions. Patient is not currently taking opioid prescriptions. Functional ability and status Nutritional status Physical activity Advanced directives List of other physicians Hospitalizations, surgeries, and ER visits in previous 12 months Vitals Screenings to include cognitive, depression, and falls Referrals and appointments  In addition, I have reviewed and discussed with patient certain preventive protocols, quality metrics, and best practice recommendations. A written personalized care plan for preventive services as well as general preventive health recommendations were provided to patient.     Cala Bradford, CMA   07/15/2023   After Visit Summary: (MyChart) Due to this being a telephonic visit,  the after visit summary with patients personalized plan was offered to patient via MyChart   Nurse Notes: Face-To-Face Visit  Ms. Manson Passey , Thank you for taking time to come for your Medicare Wellness Visit. I appreciate your ongoing commitment to your health goals. Please review the following plan we discussed and let me know if I can assist you in the future.   These are the goals we discussed:  Goals   None     This is a list of the screening recommended for you and due dates:  Health Maintenance  Topic Date Due   Hepatitis C Screening  Never done   Eye exam for diabetics  12/27/2017   COVID-19 Vaccine (3 - Pfizer risk series) 08/23/2020   Hemoglobin A1C  05/07/2023   Yearly kidney health  urinalysis for diabetes  07/13/2023   Lipid (cholesterol) test  07/13/2023   Flu Shot  06/16/2023   Complete foot exam   10/02/2023   Colon Cancer Screening  12/15/2023   Yearly kidney function blood test for diabetes  07/03/2024   Medicare Annual Wellness Visit  07/14/2024   DTaP/Tdap/Td vaccine (2 - Td or Tdap) 09/10/2024   Pap Smear  11/12/2025   HIV Screening  Completed   HPV Vaccine  Aged Out

## 2023-07-16 LAB — LIPID PANEL
Chol/HDL Ratio: 2.5 ratio (ref 0.0–4.4)
Cholesterol, Total: 96 mg/dL — ABNORMAL LOW (ref 100–199)
HDL: 39 mg/dL — ABNORMAL LOW (ref >39)
LDL Chol Calc (NIH): 44 mg/dL (ref 0–99)
Triglycerides: 56 mg/dL (ref 0–149)
VLDL Cholesterol Cal: 13 mg/dL (ref 5–40)

## 2023-07-17 LAB — MICROALBUMIN / CREATININE URINE RATIO
Creatinine, Urine: 145.8 mg/dL
Microalb/Creat Ratio: 11 mg/g{creat} (ref 0–29)
Microalbumin, Urine: 16.6 ug/mL

## 2023-07-19 NOTE — Progress Notes (Signed)
Internal Medicine Clinic Attending  Case discussed with the resident at the time of the visit.  We reviewed the resident's history and exam and pertinent patient test results.  I agree with the assessment, diagnosis, and plan of care documented in the resident's note.  

## 2023-07-19 NOTE — Addendum Note (Signed)
Addended by: Burnell Blanks on: 07/19/2023 03:17 PM   Modules accepted: Level of Service

## 2023-09-20 ENCOUNTER — Other Ambulatory Visit: Payer: Self-pay | Admitting: Student

## 2023-09-20 DIAGNOSIS — E119 Type 2 diabetes mellitus without complications: Secondary | ICD-10-CM

## 2023-10-02 NOTE — Progress Notes (Unsigned)
Patient Care Team: Marrianne Mood, MD as PCP - General Hanley Seamen, Dustin Folks, MD as Referring Physician (Optometry) Malachy Mood, MD as Consulting Physician (Oncology) Karie Soda, MD as Consulting Physician (General Surgery) Armbruster, Willaim Rayas, MD as Consulting Physician (Gastroenterology) Carver Fila, MD as Consulting Physician (Gynecologic Oncology)  Clinic Day:  10/04/2023  Referring physician: Marrianne Mood, MD  ASSESSMENT & PLAN:   Assessment & Plan: Primary cancer of hepatic flexure of colon (HCC) pT3N0M0, stage II, MSS --Patient presented with iron deficient anemia since 06/2022, and received IV iron and EPO in hospital, she was referred to GI at that time. -She had colonoscopy and the EGD on December 14, 2022, which showed a large mass in proximal transverse colon, and a biopsy confirmed adenocarcinoma.   -Her staging CT scan showed a large ovarian cyst, and bilateral adrenal gland nodules, no other definitive evidence of node or distant metastasis.  CT scan of the adrenal cortical showed adrenal adenoma. -She underwent right hemicolectomy and lymph node biopsy in early May 2024, Surgical pathology findings with patient.  She had a T3 lesion, 428 lymph nodes were negative, surgical margins were negative, no high risk features.  -she is clinically doing well, lab reviewed, exam was unremarkable, there is no clinical concern for recurrence -Continue cancer surveillance, plan to repeat surveillance CT scan in February 2025.   Plan: Labs reviewed  -CBC showing WBC 5.4; Hgb 12.2; Hct 36.5; Plt 243; Anc 3.1 -CMP - K 3.5; glucose 200; BUN 7; Creatinine 0.66; eGFR >60; Ca 9.2; LFTs normal.   -Iron 59; TIBC 294; saturation ratio 20 -ferritin - pending -CEA - pending Recommend she discuss elevated sugar with her internal medicine doctor. Where fatigue may be due to iron deficiency, or disease progression, it may also be related to worsening diabetes and blood sugar  control. She is agreeable to do that.  Patient due to have CT CAP without contrast due to allergy to IV contrast dye for surveillance. Ordered today Labs and follow up to review CT in 3 months.   The patient understands the plans discussed today and is in agreement with them.  She knows to contact our office if she develops concerns prior to her next appointment.  I provided 25 minutes of face-to-face time during this encounter and > 50% was spent counseling as documented under my assessment and plan.    Carlean Jews, NP  [facility] - A DEPT OF [facility] [address] [place] [address] Dept: [phone] Dept Fax: [phone]   Orders Placed This Encounter  Procedures   CT CHEST ABDOMEN PELVIS WO CONTRAST    Standing Status:   Future    Standing Expiration Date:   10/03/2024    Order Specific Question:   Is patient pregnant?    Answer:   No    Order Specific Question:   Preferred imaging location?    Answer:   [facility]    Order Specific Question:   If indicated for the ordered procedure, I authorize the administration of oral contrast media per Radiology protocol    Answer:   Yes    Order Specific Question:   Does the patient have a contrast media/X-ray dye allergy?    Answer:   Yes      CHIEF COMPLAINT:  CC: cancer of hepatic flexure of colon  Current Treatment: surveillance. Fereheme every 7 days when needed.   INTERVAL HISTORY:  [name]  is here today for repeat clinical assessment. She was last seen on 07/04/2023 by Dr. Mosetta Putt. She reports a gradual increase in fatigue. Is sleeping excessively. States that some days, she is not getting up until 2 in afternoon. She also had single episode of dizziness on this past Sunday. She has not had one since. Yesterday, she states she vomited without nausea or abdominal pain. Stools have been at baseline for her. She denies fevers or chills. She  denies pain. Her appetite is good. Her weight has increased 5 pounds over last 3 months .  I have reviewed the past medical history, past surgical history, social history and family history with the patient and they are unchanged from previous note.  ALLERGIES:  is allergic to bactrim [sulfamethoxazole-trimethoprim], lisinopril, losartan, bee pollen, ivp dye [iodinated contrast media], metrizamide, pollen extract, sulfa antibiotics, sulfasalazine, other, and peanut-containing drug products.  MEDICATIONS:  Current Outpatient Medications  Medication Sig Dispense Refill   acetaminophen (TYLENOL) 500 MG tablet Take 1,000 mg by mouth every 6 (six) hours as needed for moderate pain.     amLODipine (NORVASC) 10 MG tablet Take 1 tablet (10 mg total) by mouth daily. 30 tablet 11   atorvastatin (LIPITOR) 40 MG tablet TAKE 1 TABLET BY MOUTH DAILY 90 tablet 3   benzonatate (TESSALON PERLES) 100 MG capsule Take 1 capsule (100 mg total) by mouth 3 (three) times daily as needed for cough. (Patient taking differently: Take 100 mg by mouth daily as needed for cough.) 20 capsule 0   benztropine (COGENTIN) 1 MG tablet Take 1 mg by mouth daily.     bisacodyl (DULCOLAX) 5 MG EC tablet Take 5 mg by mouth daily as needed.     carvedilol (COREG) 6.25 MG tablet TAKE 1 TABLET BY MOUTH TWICE A DAY WITH MEALS 60 tablet 3   diphenhydrAMINE (BENADRYL) 50 MG tablet Take 50 mg of Benadryl 1 hour prior to your CT scan 1 tablet 0   DULoxetine (CYMBALTA) 30 MG capsule Take 1 capsule (30 mg total) by mouth daily. 30 capsule 3   ferrous sulfate 325 (65 FE) MG EC tablet Take 1 tablet (325 mg total) by mouth every other day. 15 tablet 2   fluticasone (FLONASE) 50 MCG/ACT nasal spray Place 2 sprays into both nostrils daily as needed for allergies or rhinitis.     guaiFENesin (MUCINEX) 600 MG 12 hr tablet Take 1 tablet (600 mg total) by mouth 2 (two) times daily. 14 tablet 1   Insulin Pen Needle (BD PEN NEEDLE NANO U/F) 32G X 4 MM MISC  USE TO INJECT VICTOZA ONCE DAILY 100 each 1   metFORMIN (GLUCOPHAGE) 1000 MG tablet Take 1 tablet (1,000 mg total) by mouth 2 (two) times daily. 180 tablet 3   miconazole (MICATIN) 2 % cream Apply 1 Application topically 2 (two) times daily. 28.35 g 0   Multiple Vitamins-Minerals (HAIR SKIN & NAILS) TABS Take 1 tablet by mouth daily.     omeprazole (PRILOSEC) 40 MG capsule Take 1 capsule (40 mg total) by mouth daily. 30 capsule 1   Paliperidone ER (INVEGA SUSTENNA) injection Inject 117 mg into the muscle every 30 (thirty) days.     TRULICITY 1.5 MG/0.5ML SOAJ INJECT 1.5ML INTO SKIN ONCE A WEEK 2 mL 3   No current facility-administered medications for this visit.    HISTORY OF PRESENT ILLNESS:   Oncology History  Primary cancer of hepatic flexure of colon (HCC)  12/21/2022 Imaging    IMPRESSION: 1. Colon is  minimally distended and no discrete colonic lesion is identified to correspond with patient's given history of colonic neoplasm. 2. Bilateral adrenal nodules measuring 12 mm in the right adrenal gland and 1 cm in the left adrenal gland are new from CT August 20 2015. These likely reflect benign adrenal adenomas however they are technically indeterminate and small metastatic lesions are not excluded, consider more definitive characterization with pre and postcontrast adrenal protocol CT or MRI versus attention on follow-up oncologic imaging. 3. Tiny bilateral pulmonary nodules along the major fissures measuring up to 2 mm are nonspecific but favored to reflect intrapulmonary lymph nodes. However, given lack of prior imaging for comparison these warrant attention on short-term interval follow-up dedicated chest CT. 4. 7.7 cm left ovarian cyst, recommend further evaluation by dedicated pelvic ultrasound. 5. Nonobstructive bilateral renal stones measure up to 4 mm in the right kidney.   12/26/2022 Initial Diagnosis   Cancer of transverse colon (HCC)   03/24/2023 Cancer Staging    Staging form: Colon and Rectum, AJCC 8th Edition - Pathologic stage from 03/24/2023: Stage IIA (pT3, pN0, cM0) - Signed by Malachy Mood, MD on 04/05/2023 Total positive nodes: 0 Histologic grading system: 4 grade system Histologic grade (G): G2 Residual tumor (R): R0 - None       REVIEW OF SYSTEMS:   Constitutional: Denies fevers, chills or abnormal weight loss. Increased and sleeeping excessively  Eyes: Denies blurriness of vision Ears, nose, mouth, throat, and face: Denies mucositis or sore throat Respiratory: Denies cough, dyspnea or wheezes Cardiovascular: Denies palpitation, chest discomfort or lower extremity swelling Gastrointestinal:  Denies nausea, heartburn or change in bowel habits. Did have episode of vomiting yesterday without nausea or abdominal pain.  Skin: Denies abnormal skin rashes Lymphatics: Denies new lymphadenopathy or easy bruising Neurological:Denies numbness, tingling or new weaknesses. Did feel dizziness for a short period of time on this past Sunday.  Behavioral/Psych: Mood is stable, no new changes  All other systems were reviewed with the patient and are negative.   VITALS:   Today's Vitals   10/04/23 0850 10/04/23 0901  BP:  114/84  Pulse:  85  Resp:  17  Temp:  (!) 97.4 F (36.3 C)  TempSrc:  Temporal  SpO2:  99%  Weight:  207 lb 14.4 oz (94.3 kg)  PainSc: 0-No pain    Body mass index is 34.92 kg/m.   Wt Readings from Last 3 Encounters:  10/04/23 207 lb 14.4 oz (94.3 kg)  07/15/23 202 lb (91.6 kg)  07/04/23 201 lb 12.8 oz (91.5 kg)    Body mass index is 34.92 kg/m.  Performance status (ECOG): 1 - Symptomatic but completely ambulatory  PHYSICAL EXAM:   GENERAL:alert, no distress and comfortable. She is hard of hearing, wearing hearing aid and is able to read lips.  SKIN: skin color, texture, turgor are normal, no rashes or significant lesions EYES: normal, Conjunctiva are pink and non-injected, sclera clear OROPHARYNX:no exudate, no  erythema and lips, buccal mucosa, and tongue normal  NECK: supple, thyroid normal size, non-tender, without nodularity LYMPH:  no palpable lymphadenopathy in the cervical, axillary or inguinal LUNGS: clear to auscultation and percussion with normal breathing effort HEART: regular rate & rhythm and no murmurs and no lower extremity edema ABDOMEN:abdomen soft, non-tender and normal bowel sounds Musculoskeletal:no cyanosis of digits and no clubbing  NEURO: alert & oriented x 3 with fluent speech, no focal motor/sensory deficits  LABORATORY DATA:  I have reviewed the data as listed    Component  Value Date/Time   NA 141 10/04/2023 0833   NA 142 06/15/2022 1631   K 3.5 10/04/2023 0833   CL 104 10/04/2023 0833   CO2 32 10/04/2023 0833   GLUCOSE 200 (H) 10/04/2023 0833   BUN 7 10/04/2023 0833   BUN 3 (L) 06/15/2022 1631   CREATININE 0.66 10/04/2023 0833   CREATININE 0.67 07/30/2014 1133   CALCIUM 9.2 10/04/2023 0833   PROT 6.6 10/04/2023 0833   PROT 6.5 06/15/2022 1631   ALBUMIN 4.0 10/04/2023 0833   ALBUMIN 4.0 06/15/2022 1631   AST 10 (L) 10/04/2023 0833   ALT 11 10/04/2023 0833   ALKPHOS 86 10/04/2023 0833   BILITOT 0.3 10/04/2023 0833   GFRNONAA >60 10/04/2023 0833   GFRNONAA >89 07/30/2014 1133   GFRAA 123 05/01/2020 0943   GFRAA >89 07/30/2014 1133    Lab Results  Component Value Date   WBC 5.4 10/04/2023   NEUTROABS 3.1 10/04/2023   HGB 12.2 10/04/2023   HCT 36.5 10/04/2023   MCV 90.6 10/04/2023   PLT 243 10/04/2023     Lab Results  Component Value Date   IRON 59 10/04/2023   TIBC 294 10/04/2023   FERRITIN 69 07/04/2023

## 2023-10-02 NOTE — Assessment & Plan Note (Signed)
pT3N0M0, stage II, MSS --Patient presented with iron deficient anemia since 06/2022, and received IV iron and EPO in hospital, she was referred to GI at that time. -She had colonoscopy and the EGD on December 14, 2022, which showed a large mass in proximal transverse colon, and a biopsy confirmed adenocarcinoma.   -Her staging CT scan showed a large ovarian cyst, and bilateral adrenal gland nodules, no other definitive evidence of node or distant metastasis.  CT scan of the adrenal cortical showed adrenal adenoma. -She underwent right hemicolectomy and lymph node biopsy in early May 2024, Surgical pathology findings with patient.  She had a T3 lesion, 428 lymph nodes were negative, surgical margins were negative, no high risk features.  -she is clinically doing well, lab reviewed. Will send MyChart message regarding pending lab results.  -CT CAP ordered today -labs and follow up in 3 months

## 2023-10-04 ENCOUNTER — Inpatient Hospital Stay: Payer: Medicare Other | Attending: Hematology

## 2023-10-04 ENCOUNTER — Inpatient Hospital Stay (HOSPITAL_BASED_OUTPATIENT_CLINIC_OR_DEPARTMENT_OTHER): Payer: Medicare Other | Admitting: Nurse Practitioner

## 2023-10-04 ENCOUNTER — Encounter: Payer: Self-pay | Admitting: Nurse Practitioner

## 2023-10-04 VITALS — BP 114/84 | HR 85 | Temp 97.4°F | Resp 17 | Wt 207.9 lb

## 2023-10-04 DIAGNOSIS — D500 Iron deficiency anemia secondary to blood loss (chronic): Secondary | ICD-10-CM

## 2023-10-04 DIAGNOSIS — C183 Malignant neoplasm of hepatic flexure: Secondary | ICD-10-CM | POA: Diagnosis not present

## 2023-10-04 DIAGNOSIS — C184 Malignant neoplasm of transverse colon: Secondary | ICD-10-CM

## 2023-10-04 DIAGNOSIS — Z85038 Personal history of other malignant neoplasm of large intestine: Secondary | ICD-10-CM | POA: Diagnosis present

## 2023-10-04 DIAGNOSIS — Z08 Encounter for follow-up examination after completed treatment for malignant neoplasm: Secondary | ICD-10-CM | POA: Diagnosis present

## 2023-10-04 DIAGNOSIS — D509 Iron deficiency anemia, unspecified: Secondary | ICD-10-CM | POA: Insufficient documentation

## 2023-10-04 LAB — CBC WITH DIFFERENTIAL (CANCER CENTER ONLY)
Abs Immature Granulocytes: 0.01 10*3/uL (ref 0.00–0.07)
Basophils Absolute: 0 10*3/uL (ref 0.0–0.1)
Basophils Relative: 0 %
Eosinophils Absolute: 0.1 10*3/uL (ref 0.0–0.5)
Eosinophils Relative: 2 %
HCT: 36.5 % (ref 36.0–46.0)
Hemoglobin: 12.2 g/dL (ref 12.0–15.0)
Immature Granulocytes: 0 %
Lymphocytes Relative: 34 %
Lymphs Abs: 1.8 10*3/uL (ref 0.7–4.0)
MCH: 30.3 pg (ref 26.0–34.0)
MCHC: 33.4 g/dL (ref 30.0–36.0)
MCV: 90.6 fL (ref 80.0–100.0)
Monocytes Absolute: 0.4 10*3/uL (ref 0.1–1.0)
Monocytes Relative: 8 %
Neutro Abs: 3.1 10*3/uL (ref 1.7–7.7)
Neutrophils Relative %: 56 %
Platelet Count: 243 10*3/uL (ref 150–400)
RBC: 4.03 MIL/uL (ref 3.87–5.11)
RDW: 13.6 % (ref 11.5–15.5)
WBC Count: 5.4 10*3/uL (ref 4.0–10.5)
nRBC: 0 % (ref 0.0–0.2)

## 2023-10-04 LAB — CMP (CANCER CENTER ONLY)
ALT: 11 U/L (ref 0–44)
AST: 10 U/L — ABNORMAL LOW (ref 15–41)
Albumin: 4 g/dL (ref 3.5–5.0)
Alkaline Phosphatase: 86 U/L (ref 38–126)
Anion gap: 5 (ref 5–15)
BUN: 7 mg/dL (ref 6–20)
CO2: 32 mmol/L (ref 22–32)
Calcium: 9.2 mg/dL (ref 8.9–10.3)
Chloride: 104 mmol/L (ref 98–111)
Creatinine: 0.66 mg/dL (ref 0.44–1.00)
GFR, Estimated: >60 mL/min (ref >60)
Glucose, Bld: 200 mg/dL — ABNORMAL HIGH (ref 70–99)
Potassium: 3.5 mmol/L (ref 3.5–5.1)
Sodium: 141 mmol/L (ref 135–145)
Total Bilirubin: 0.3 mg/dL (ref <1.2)
Total Protein: 6.6 g/dL (ref 6.5–8.1)

## 2023-10-04 LAB — FERRITIN: Ferritin: 53 ng/mL (ref 11–307)

## 2023-10-04 LAB — IRON AND IRON BINDING CAPACITY (CC-WL,HP ONLY)
Iron: 59 ug/dL (ref 28–170)
Saturation Ratios: 20 % (ref 10.4–31.8)
TIBC: 294 ug/dL (ref 250–450)
UIBC: 235 ug/dL (ref 148–442)

## 2023-10-04 LAB — CEA (ACCESS): CEA (CHCC): 1.07 ng/mL (ref 0.00–5.00)

## 2023-10-12 ENCOUNTER — Other Ambulatory Visit: Payer: Self-pay | Admitting: Student

## 2023-10-12 DIAGNOSIS — I10 Essential (primary) hypertension: Secondary | ICD-10-CM

## 2023-10-28 ENCOUNTER — Encounter (HOSPITAL_COMMUNITY): Payer: Self-pay

## 2023-10-28 ENCOUNTER — Emergency Department (HOSPITAL_COMMUNITY)
Admission: EM | Admit: 2023-10-28 | Discharge: 2023-10-29 | Disposition: A | Discharge status: Home / Self Care | Payer: Medicare Other | Attending: Emergency Medicine | Admitting: Emergency Medicine

## 2023-10-28 ENCOUNTER — Other Ambulatory Visit: Payer: Self-pay

## 2023-10-28 DIAGNOSIS — E785 Hyperlipidemia, unspecified: Secondary | ICD-10-CM | POA: Insufficient documentation

## 2023-10-28 DIAGNOSIS — R197 Diarrhea, unspecified: Secondary | ICD-10-CM | POA: Diagnosis not present

## 2023-10-28 DIAGNOSIS — R1110 Vomiting, unspecified: Secondary | ICD-10-CM | POA: Insufficient documentation

## 2023-10-28 DIAGNOSIS — Z85038 Personal history of other malignant neoplasm of large intestine: Secondary | ICD-10-CM | POA: Insufficient documentation

## 2023-10-28 DIAGNOSIS — G4733 Obstructive sleep apnea (adult) (pediatric): Secondary | ICD-10-CM | POA: Insufficient documentation

## 2023-10-28 DIAGNOSIS — Z794 Long term (current) use of insulin: Secondary | ICD-10-CM | POA: Diagnosis not present

## 2023-10-28 DIAGNOSIS — I10 Essential (primary) hypertension: Secondary | ICD-10-CM | POA: Diagnosis not present

## 2023-10-28 DIAGNOSIS — Z7984 Long term (current) use of oral hypoglycemic drugs: Secondary | ICD-10-CM | POA: Diagnosis not present

## 2023-10-28 DIAGNOSIS — E119 Type 2 diabetes mellitus without complications: Secondary | ICD-10-CM | POA: Diagnosis not present

## 2023-10-28 DIAGNOSIS — Z1152 Encounter for screening for COVID-19: Secondary | ICD-10-CM | POA: Insufficient documentation

## 2023-10-28 DIAGNOSIS — Z79899 Other long term (current) drug therapy: Secondary | ICD-10-CM | POA: Diagnosis not present

## 2023-10-28 DIAGNOSIS — Z91010 Allergy to peanuts: Secondary | ICD-10-CM | POA: Insufficient documentation

## 2023-10-28 LAB — CBC
HCT: 40.6 % (ref 36.0–46.0)
Hemoglobin: 14 g/dL (ref 12.0–15.0)
MCH: 29.8 pg (ref 26.0–34.0)
MCHC: 34.5 g/dL (ref 30.0–36.0)
MCV: 86.4 fL (ref 80.0–100.0)
Platelets: 253 10*3/uL (ref 150–400)
RBC: 4.7 MIL/uL (ref 3.87–5.11)
RDW: 13.2 % (ref 11.5–15.5)
WBC: 7.3 10*3/uL (ref 4.0–10.5)
nRBC: 0 % (ref 0.0–0.2)

## 2023-10-28 LAB — COMPREHENSIVE METABOLIC PANEL
ALT: 18 U/L (ref 0–44)
AST: 19 U/L (ref 15–41)
Albumin: 4.1 g/dL (ref 3.5–5.0)
Alkaline Phosphatase: 68 U/L (ref 38–126)
Anion gap: 15 (ref 5–15)
BUN: 14 mg/dL (ref 6–20)
CO2: 26 mmol/L (ref 22–32)
Calcium: 9.9 mg/dL (ref 8.9–10.3)
Chloride: 97 mmol/L — ABNORMAL LOW (ref 98–111)
Creatinine, Ser: 1.43 mg/dL — ABNORMAL HIGH (ref 0.44–1.00)
GFR, Estimated: 45 mL/min — ABNORMAL LOW (ref >60)
Glucose, Bld: 126 mg/dL — ABNORMAL HIGH (ref 70–99)
Potassium: 3.7 mmol/L (ref 3.5–5.1)
Sodium: 138 mmol/L (ref 135–145)
Total Bilirubin: 1.1 mg/dL (ref <1.2)
Total Protein: 7.8 g/dL (ref 6.5–8.1)

## 2023-10-28 LAB — URINALYSIS, ROUTINE W REFLEX MICROSCOPIC
Bilirubin Urine: NEGATIVE
Glucose, UA: NEGATIVE mg/dL
Hgb urine dipstick: NEGATIVE
Ketones, ur: 20 mg/dL — AB
Nitrite: NEGATIVE
Protein, ur: NEGATIVE mg/dL
Specific Gravity, Urine: 1.011 (ref 1.005–1.030)
pH: 5 (ref 5.0–8.0)

## 2023-10-28 LAB — NO BLOOD PRODUCTS

## 2023-10-28 LAB — LIPASE, BLOOD: Lipase: 25 U/L (ref 11–51)

## 2023-10-28 MED ORDER — SODIUM CHLORIDE 0.9 % IV BOLUS
500.0000 mL | Freq: Once | INTRAVENOUS | Status: AC
  Administered 2023-10-28: 500 mL via INTRAVENOUS

## 2023-10-28 MED ORDER — SODIUM CHLORIDE 0.9 % IV BOLUS
500.0000 mL | Freq: Once | INTRAVENOUS | Status: AC
  Administered 2023-10-29: 500 mL via INTRAVENOUS

## 2023-10-28 MED ORDER — ONDANSETRON HCL 4 MG/2ML IJ SOLN
4.0000 mg | Freq: Once | INTRAMUSCULAR | Status: AC
  Administered 2023-10-28: 4 mg via INTRAVENOUS
  Filled 2023-10-28: qty 2

## 2023-10-28 NOTE — ED Provider Notes (Signed)
[place] EMERGENCY DEPARTMENT AT [facility] Provider Note   CSN: [id number] Arrival date & time: 10/28/23  1837     History  No chief complaint on file.   [name] is a 52 y.o. female who presents with nausea vomiting diarrhea for the last 3 days, greater than 10 episodes of vomiting, a few episodes of diarrhea, nonbloody nonbilious emesis.  Hypertension diabetes, hyperlipidemia, OSA, difficulty hearing.  HPI     Home Medications Prior to Admission medications   Medication Sig Start Date End Date Taking? Authorizing Provider  ondansetron (ZOFRAN-ODT) 4 MG disintegrating tablet Take 1 tablet (4 mg total) by mouth every 8 (eight) hours as needed for nausea or vomiting. 10/29/23  Yes Talulah Schirmer R, PA-C  acetaminophen (TYLENOL) 500 MG tablet Take 1,000 mg by mouth every 6 (six) hours as needed for moderate pain.    [provider]  amLODipine (NORVASC) 10 MG tablet Take 1 tablet (10 mg total) by mouth daily. 11/16/22 11/16/23  Marrianne Mood, MD  atorvastatin (LIPITOR) 40 MG tablet TAKE 1 TABLET BY MOUTH DAILY 02/07/23   Marrianne Mood, MD  benzonatate (TESSALON PERLES) 100 MG capsule Take 1 capsule (100 mg total) by mouth 3 (three) times daily as needed for cough. Patient taking differently: Take 100 mg by mouth daily as needed for cough. 12/03/22   Mapp, Gaylyn Cheers, MD  benztropine (COGENTIN) 1 MG tablet Take 1 mg by mouth daily. 09/20/23   [provider]  bisacodyl (DULCOLAX) 5 MG EC tablet Take 5 mg by mouth daily as needed. 12/28/22   [provider]  carvedilol (COREG) 6.25 MG tablet TAKE 1 TABLET BY MOUTH TWICE A DAY WITH MEALS 10/12/23   Marrianne Mood, MD  diphenhydrAMINE (BENADRYL) 50 MG tablet Take 50 mg of Benadryl 1 hour prior to your CT scan 01/10/23   Malachy Mood, MD  DULoxetine (CYMBALTA) 30 MG capsule Take 1 capsule (30 mg total) by mouth daily. 11/16/22   Marrianne Mood, MD  ferrous sulfate 325 (65 FE) MG EC tablet  Take 1 tablet (325 mg total) by mouth every other day. 11/16/22   Marrianne Mood, MD  fluticasone (FLONASE) 50 MCG/ACT nasal spray Place 2 sprays into both nostrils daily as needed for allergies or rhinitis.    [provider]  guaiFENesin (MUCINEX) 600 MG 12 hr tablet Take 1 tablet (600 mg total) by mouth 2 (two) times daily. 12/03/22   Mapp, Gaylyn Cheers, MD  Insulin Pen Needle (BD PEN NEEDLE NANO U/F) 32G X 4 MM MISC USE TO INJECT VICTOZA ONCE DAILY 11/16/22   Marrianne Mood, MD  metFORMIN (GLUCOPHAGE) 1000 MG tablet Take 1 tablet (1,000 mg total) by mouth 2 (two) times daily. 11/16/22 11/16/23  Marrianne Mood, MD  miconazole (MICATIN) 2 % cream Apply 1 Application topically 2 (two) times daily. 04/12/23   Marolyn Haller, MD  Multiple Vitamins-Minerals (HAIR SKIN & NAILS) TABS Take 1 tablet by mouth daily.    [provider]  omeprazole (PRILOSEC) 40 MG capsule Take 1 capsule (40 mg total) by mouth daily. 12/07/22   Armbruster, Willaim Rayas, MD  Paliperidone ER (INVEGA SUSTENNA) injection Inject 117 mg into the muscle every 30 (thirty) days.    [provider]  TRULICITY 1.5 MG/0.5ML SOAJ INJECT 1.5ML INTO SKIN ONCE A WEEK 09/21/23   Katheran James, DO      Allergies    Bactrim [sulfamethoxazole-trimethoprim], Lisinopril, Losartan, Bee pollen, Ivp dye [iodinated contrast media], Metrizamide, Pollen extract, Sulfa antibiotics, Sulfasalazine, Other, and  Peanut-containing drug products    Review of Systems   Review of Systems  Constitutional:  Positive for appetite change.  HENT: Negative.    Respiratory: Negative.    Cardiovascular: Negative.   Gastrointestinal:  Positive for diarrhea, nausea and vomiting.  Genitourinary: Negative.     Physical Exam Updated Vital Signs BP (!) 156/95   Pulse 91   Temp 98.1 F (36.7 C) (Oral)   Resp 18   Ht 5\' 4"  (1.626 m)   Wt 91.2 kg   LMP 07/23/2015 (Exact Date)   SpO2 98%   BMI 34.50 kg/m  Physical Exam Vitals and  nursing note reviewed.  Constitutional:      Appearance: She is not ill-appearing or toxic-appearing.  HENT:     Head: Normocephalic and atraumatic.     Mouth/Throat:     Mouth: Mucous membranes are moist.     Pharynx: No oropharyngeal exudate or posterior oropharyngeal erythema.  Eyes:     General:        Right eye: No discharge.        Left eye: No discharge.     Conjunctiva/sclera: Conjunctivae normal.  Cardiovascular:     Rate and Rhythm: Normal rate and regular rhythm.     Pulses: Normal pulses.     Heart sounds: Normal heart sounds. No murmur heard. Pulmonary:     Effort: Pulmonary effort is normal. No respiratory distress.     Breath sounds: Normal breath sounds. No wheezing or rales.  Abdominal:     General: Bowel sounds are normal. There is no distension.     Palpations: Abdomen is soft.     Tenderness: There is no abdominal tenderness. There is no guarding or rebound.  Musculoskeletal:        General: No deformity.     Cervical back: Neck supple.     Right lower leg: No edema.     Left lower leg: No edema.  Skin:    General: Skin is warm and dry.     Capillary Refill: Capillary refill takes less than 2 seconds.  Neurological:     General: No focal deficit present.     Mental Status: She is alert and oriented to person, place, and time. Mental status is at baseline.  Psychiatric:        Mood and Affect: Mood normal.     ED Results / Procedures / Treatments   Labs (all labs ordered are listed, but only abnormal results are displayed) Labs Reviewed  COMPREHENSIVE METABOLIC PANEL - Abnormal; Notable for the following components:      Result Value   Chloride 97 (*)    Glucose, Bld 126 (*)    Creatinine, Ser 1.43 (*)    GFR, Estimated 45 (*)    All other components within normal limits  URINALYSIS, ROUTINE W REFLEX MICROSCOPIC - Abnormal; Notable for the following components:   Ketones, ur 20 (*)    Leukocytes,Ua MODERATE (*)    Bacteria, UA RARE (*)    All  other components within normal limits  RESP PANEL BY RT-PCR (RSV, FLU A&B, COVID)  RVPGX2  CBC  LIPASE, BLOOD  POC OCCULT BLOOD, ED  NO BLOOD PRODUCTS    EKG None  Radiology No results found.  Procedures Procedures    Medications Ordered in ED Medications  sodium chloride 0.9 % bolus 500 mL (0 mLs Intravenous Stopped 10/29/23 0003)  ondansetron (ZOFRAN) injection 4 mg (4 mg Intravenous Given 10/28/23 2243)  sodium chloride 0.9 %  bolus 500 mL (0 mLs Intravenous Stopped 10/29/23 0101)  ondansetron (ZOFRAN) injection 4 mg (4 mg Intravenous Given 10/29/23 0152)    ED Course/ Medical Decision Making/ A&P                                 Medical Decision Making 52 y/o female with N/V/D  HTN on intake cardiouplmonary exam is unremarkable, abodminal exam is benign.   The differential diagnosis of diarrhea includes but is not limited to: Viral: norovirus/rotavirus; Bacterial-Campylobacter,Shigella, Salmonella, Escherichia coli, E. coli 0157:H7, Yersinia enterocolitica, Vibrio cholerae, Clostridium difficile. Parasitic- Giardia lamblia, Cryptosporidium,Entamoeba histolytica,Cyclospora, Microsporidium. Toxin- Staphylococcus aureus, Bacillus cereus.  Noninfectious causes include GI Bleed, Appendicitis, Mesenteric Ischemia, Diverticulitis, Adrenal Crisis, Thyroid Storm, Toxicologic exposures, Antibiotic or drug-associated, inflammatory bowel disease.   Amount and/or Complexity of Data Reviewed Labs: ordered.    Details: CBC unremarkable, CMP with creatinine of 1.4, patient administered IV fluids.  UA with ketonuria, lipase is unremarkable.  Risk Prescription drug management.   Clinical picture consistent with acute viral gastroenteritis.  Patient tolerating p.o. after IV Zofran, received fluid resuscitation for AKI.  Feel she is safe for discharge at this time.  Regarding her patient's reported blood in stool, bright red blood on toilet tissue after several days of diarrhea,  patient declined rectal exam.  Suspect likely irritation versus hemorrhoids.  Clinical concern for emergent etiology of this is exceedingly low.  Additionally as patient history of colon cancer therefore Hemoccult likely unreliable.  Clinical concern for emergent underlying etiology of this patient's symptoms that would warrant further ED workup or inpatient management is exceedingly low.  Sinthia voiced understanding of her medical evaluation and treatment plan. Each of their questions answered to their expressed satisfaction.  Return precautions were given.  Patient is well-appearing, stable, and was discharged in good condition.  This chart was dictated using voice recognition software, Dragon. Despite the best efforts of this provider to proofread and correct errors, errors may still occur which can change documentation meaning.         Final Clinical Impression(s) / ED Diagnoses Final diagnoses:  Vomiting and diarrhea    Rx / DC Orders ED Discharge Orders          Ordered    ondansetron (ZOFRAN-ODT) 4 MG disintegrating tablet  Every 8 hours PRN        10/29/23 0210              Adriyana Greenbaum, Eugene Gavia, PA-C 10/29/23 0716    Melene Plan, DO 10/29/23 2257

## 2023-10-28 NOTE — ED Triage Notes (Signed)
Pt came in via POV d/t beginning to vomit Monday morning, states each time she tried to eat nothing would stay down. She then noted blood in her vomit & stool the same day. A/Ox4, denies current pain but was having abd pain before arrival.

## 2023-10-28 NOTE — ED Notes (Signed)
Blood refusal formed sent to lab.

## 2023-10-29 DIAGNOSIS — R1110 Vomiting, unspecified: Secondary | ICD-10-CM | POA: Diagnosis not present

## 2023-10-29 LAB — RESP PANEL BY RT-PCR (RSV, FLU A&B, COVID)  RVPGX2
Influenza A by PCR: NEGATIVE
Influenza B by PCR: NEGATIVE
Resp Syncytial Virus by PCR: NEGATIVE
SARS Coronavirus 2 by RT PCR: NEGATIVE

## 2023-10-29 MED ORDER — ONDANSETRON 4 MG PO TBDP: 4.0000 mg | ORAL_TABLET | Freq: Three times a day (TID) | ORAL | 0 refills | Status: AC | PRN

## 2023-10-29 MED ORDER — ONDANSETRON HCL 4 MG/2ML IJ SOLN
4.0000 mg | Freq: Once | INTRAMUSCULAR | Status: AC
  Administered 2023-10-29: 4 mg via INTRAVENOUS
  Filled 2023-10-29: qty 2

## 2023-10-29 NOTE — Discharge Instructions (Addendum)
You were seen in the ER today for nausea vomiting and diarrhea.  You likely have a viral illness causing your symptoms.  Your labs were reassuring with the exception of mild dehydration.  You were given IV fluids in the ER and have been prescribed nausea medication which you should take as needed.  You may use over-the-counter Imodium as needed for diarrhea.  Please monitor your hydration, follow-up with your primary care doctor and return to the ER with any new severe symptoms.

## 2023-10-29 NOTE — ED Notes (Signed)
Pt given ginger ale and saltine crackers for PO challenge  

## 2023-10-31 ENCOUNTER — Encounter: Payer: Medicare Other | Admitting: Internal Medicine

## 2023-10-31 NOTE — Progress Notes (Unsigned)
N/V/D Recently evaluated at ED for nausea, vomiting, and diarrhea for 3 days. At that evaluation she explained she had had >10 episodes of nonbloody nonbilious vomiting, a few episodes of diarrhea. She was treated for viral gastroenteritis and was able to tolerate PO intake after receiving IV zofran. She also received IVF for AKI  Today, ***.  Plan:

## 2023-11-04 ENCOUNTER — Encounter: Payer: Self-pay | Admitting: Student

## 2023-11-04 ENCOUNTER — Ambulatory Visit (INDEPENDENT_AMBULATORY_CARE_PROVIDER_SITE_OTHER): Payer: Medicare Other | Admitting: Student

## 2023-11-04 VITALS — BP 112/74 | HR 101 | Temp 98.6°F | Ht 64.0 in | Wt 195.3 lb

## 2023-11-04 DIAGNOSIS — E119 Type 2 diabetes mellitus without complications: Secondary | ICD-10-CM

## 2023-11-04 DIAGNOSIS — E785 Hyperlipidemia, unspecified: Secondary | ICD-10-CM | POA: Diagnosis not present

## 2023-11-04 DIAGNOSIS — Z7984 Long term (current) use of oral hypoglycemic drugs: Secondary | ICD-10-CM

## 2023-11-04 DIAGNOSIS — I10 Essential (primary) hypertension: Secondary | ICD-10-CM | POA: Diagnosis not present

## 2023-11-04 DIAGNOSIS — A084 Viral intestinal infection, unspecified: Secondary | ICD-10-CM | POA: Diagnosis not present

## 2023-11-04 LAB — POCT GLYCOSYLATED HEMOGLOBIN (HGB A1C): Hemoglobin A1C: 6.7 % — AB (ref 4.0–5.6)

## 2023-11-04 LAB — GLUCOSE, CAPILLARY: Glucose-Capillary: 111 mg/dL — ABNORMAL HIGH (ref 70–99)

## 2023-11-04 NOTE — Assessment & Plan Note (Signed)
Lab Results  Component Value Date   LDLCALC 44 07/15/2023   Patient is currently taken atorvastatin 40 mg daily.  Her LDL was 44 checked on 07/15/2023.  No other concerns at this time.  -Atorvastatin 40 mg daily -Will repeat your LDL in about 6 to 8 months.

## 2023-11-04 NOTE — Assessment & Plan Note (Signed)
Patient was recently seen in ED on 12/13 for multiple episodes of nausea, vomiting, few days of diarrhea.  She was treated with IV fluids and given Zofran.  Patient reports that her symptoms started 2 weeks ago, she returned from a gathering on Sunday, her symptoms started right afterwards.  She reports multiple episodes of nausea and vomiting.  She also reported few episodes of diarrhea with couple stools that showed streaks of blood as she wiped.  Patient also noted cough during these episodes but no congestion or sore throat.    Presently, she states that her nausea and vomiting has resolved.  She has not had these episodes for the past couple of days.  She reports that she had a bowel movement yesterday with formed stools yesterday.  She checked her temperature yesterday was 99.1.  She is tolerating diet well, she states that she is eating softer food currently.  She has not had any bright red blood in her bowels this week.  Likely her symptoms were due to a viral that resulted in GI symptoms.  Plan: -Patient is recommended supportive care at home with fluid hydration and softer diet and advance as patient tolerates.

## 2023-11-04 NOTE — Assessment & Plan Note (Signed)
BP Readings from Last 3 Encounters:  11/04/23 112/74  10/29/23 (!) 156/95  10/04/23 114/84   Patient is currently on amlodipine 10 mg and Coreg 6.25 mg twice daily.  Her blood pressure is 112/74 today, no chest pain or shortness of breath.  No dizziness or lightheadedness.  Will continue the current regimen.  -Continue amlodipine 10 mg daily and Coreg 6.25 mg twice daily. -Will repeat BMP at this visit, as in the ED she had an AKI.

## 2023-11-04 NOTE — Progress Notes (Signed)
Established Patient Office Visit  Subjective   Patient ID: [name], female    DOB: [birth year]  Age: 52 y.o. MRN: [id number]  Chief Complaint  Patient presents with   Follow-up    ED follow up "stomach virus" Diarrhea-last episode last night Vomiting-last episode year     HPI This is 52 year old female living with a history stated below and presents today for A1c check. Please see problem based assessment and plan for additional details.    Patient Active Problem List   Diagnosis Date Noted   Viral gastroenteritis 11/04/2023   Tinea cruris 04/12/2023   Cysts of both ovaries 03/24/2023   History of endometriosis 03/24/2023   Bilateral deafness 12/28/2022   History of colonic polyps 12/28/2022   Pelvic mass in female 12/28/2022   Transfusion of blood product refused for religious reason 12/28/2022   Primary cancer of hepatic flexure of colon (HCC) 12/26/2022   Healthcare maintenance 12/03/2022   Nasal congestion 12/03/2022   Chronic diarrhea of unknown origin 10/03/2022   OSA (obstructive sleep apnea) 08/02/2022   Blood transfusion declined because patient is Jehovah's Witness    Asymmetric SNHL (sensorineural hearing loss) 04/02/2022   Need for pneumococcal vaccination 02/19/2021   Hypertension 03/21/2018   Allergic rhinitis 01/25/2018   Schizoaffective disorder, chronic condition (HCC) 06/18/2016   Iron deficiency anemia 03/04/2015   Hyperlipidemia LDL goal <70 09/10/2014   Obesity (BMI 30-39.9) 07/30/2014   Pap smear for cervical cancer screening 07/30/2014   Diabetes mellitus type 2, noninsulin dependent (HCC) 11/07/2012   Past Medical History:  Diagnosis Date   Anemia    Anxiety    Congenital cerebral palsy (HCC)    Depression    Diabetes mellitus due to abnormal insulin (HCC)    Dyspnea    Edema 06/10/2017   Endometriosis    Hearing impairment    Heart murmur    at birth   Hypertension    Neuromuscular disorder (HCC)    Cerebral palsy- very mild  left side of brain   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 US Neck 11/06/14: Bilateral nodules. Dominant right lower pole nodule measures 2.3 cm. Findings meet consensus criteria for biopsy. Ultrasound-guided fine needle aspiration should be considered Aspiration pathology: benign follicular nodule   Transfusion of blood product refused for religious reason 12/28/2022   Past Surgical History:  Procedure Laterality Date   CESAREAN SECTION     EXPLORATORY LAPAROTOMY WITH ABDOMINAL MASS EXCISION     MYOMECTOMY N/A 08/05/2015   Procedure: Abdominal Hysterectomy, Bilateral Salpingectomy;  Surgeon: Tereso Newcomer, MD;  Location: [facility];  Service: Gynecology;  Laterality: N/A;   ROBOTIC ASSISTED SALPINGO OOPHERECTOMY Bilateral 03/24/2023   Procedure: XI ROBOTIC ASSISTED  BILATERAL  OOPHORECTOMY, LYSIS OF ADHESIONS;  Surgeon: Carver Fila, MD;  Location: [facility];  Service: Gynecology;  Laterality: Bilateral;   Social History   Tobacco Use   Smoking status: Never   Smokeless tobacco: Never  Vaping Use   Vaping status: Never Used  Substance Use Topics   Alcohol use: No   Drug use: No   Social History   Socioeconomic History   Marital status: Married    Spouse name: Not on file   Number of children:  1   Years of education: Not on file   Highest education level: Not on file  Occupational History   Not on file  Tobacco Use   Smoking status: Never   Smokeless tobacco: Never  Vaping Use   Vaping status: Never Used  Substance and Sexual Activity   Alcohol use: No   Drug use: No   Sexual activity: Not Currently    Birth control/protection: Surgical  Other Topics Concern   Not on file  Social History Narrative   Not on file   Social Drivers of Health    Financial Resource Strain: Medium Risk (07/15/2023)   Overall Financial Resource Strain (CARDIA)    Difficulty of Paying Living Expenses: Somewhat hard  Food Insecurity: Low Risk  (08/10/2023)   Received from [facility]   Hunger Vital Sign    Worried About Running Out of Food in the Last Year: Never true    Ran Out of Food in the Last Year: Never true  Recent Concern: Food Insecurity - Food Insecurity Present (07/15/2023)   Hunger Vital Sign    Worried About Running Out of Food in the Last Year: Sometimes true    Ran Out of Food in the Last Year: Sometimes true  Transportation Needs: No Transportation Needs (08/10/2023)   Received from [facility]    In the past 12 months, has lack of reliable transportation kept you from medical appointments, meetings, work or from getting things needed for daily living? : No  Physical Activity: Inactive (07/15/2023)   Exercise Vital Sign    Days of Exercise per Week: 0 days    Minutes of Exercise per Session: 0 min  Stress: No Stress Concern Present (07/15/2023)   [employer] of Occupational Health - Occupational Stress Questionnaire    Feeling of Stress : Only a little  Social Connections: Moderately Isolated (07/15/2023)   Social Connection and Isolation Panel [NHANES]    Frequency of Communication with Friends and Family: Twice a week    Frequency of Social Gatherings with Friends and Family: Once a week    Attends Religious Services: More than 4 times per year    Active Member of [employer] or Organizations: No    Attends Banker Meetings: Never    Marital Status: Separated  Intimate Partner Violence: Not At Risk (07/15/2023)   Humiliation, Afraid, Rape, and Kick questionnaire    Fear of Current or Ex-Partner: No    Emotionally Abused: No    Physically Abused: No    Sexually Abused: No   Family Status  Relation Name Status   Mother  Deceased   Father  Alive   Mat Aunt  (Not Specified)   MGM  (Not  Specified)   Neg Hx  (Not Specified)  No partnership data on file   Family History  Problem Relation Age of Onset   Diabetes Mother    Breast cancer Mother    Cancer Mother 21       breast cancer   Heart failure Mother    Hypertension Mother    Diabetes Mellitus II Mother    Cancer Father 20       colon cancer   Diabetes Mellitus II Father    Hypertension Father    Cancer Maternal Aunt 32       breast cancer   Cancer Maternal Grandmother        lung cancer   Colon cancer Neg Hx    Esophageal cancer Neg  Hx    Pancreatic cancer Neg Hx    Ulcerative colitis Neg Hx    Allergies  Allergen Reactions   Bactrim [Sulfamethoxazole-Trimethoprim] Anaphylaxis, Shortness Of Breath and Swelling   Lisinopril Cough   Losartan Cough   Bee Pollen Other (See Comments)    Seasonal allergies   Ivp Dye [Iodinated Contrast Media] Nausea And Vomiting   Metrizamide Nausea And Vomiting   Pollen Extract     Seasonal allergies   Sulfa Antibiotics Swelling    Eyes and lips swelled up   Sulfasalazine Swelling    Eyes and lips swelled up   Other     Blood Product Refusal    Peanut-Containing Drug Products Rash    ROS   ROS negative except for what is noted on the assessment and plan.  Objective:     BP 112/74 (BP Location: Left Arm, Patient Position: Sitting, Cuff Size: Normal)   Pulse (!) 101   Temp 98.6 F (37 C) (Oral)   Ht 5\' 4"  (1.626 m)   Wt 195 lb 4.8 oz (88.6 kg)   LMP 07/23/2015 (Exact Date)   BMI 33.52 kg/m  BP Readings from Last 3 Encounters:  11/04/23 112/74  10/29/23 (!) 156/95  10/04/23 114/84   Wt Readings from Last 3 Encounters:  11/04/23 195 lb 4.8 oz (88.6 kg)  10/28/23 201 lb (91.2 kg)  10/04/23 207 lb 14.4 oz (94.3 kg)   SpO2 Readings from Last 3 Encounters:  10/29/23 98%  10/04/23 99%  07/15/23 100%      Physical Exam  General: Sitting in chair, no acute distress Cardiovascular: Regular rate, no M/R/G.  No lower extremity edema  bilaterally. Pulmonary: Normal work of breathing, lungs are clear to auscultation bilaterally.  No wheezing or crackles. Abdomen: Soft, nondistended, nontender, bowel sounds present MSK: Range of motion intact, 2+ DP pulses intact bilaterally.  Results for orders placed or performed in visit on 11/04/23  Glucose, capillary  Result Value Ref Range   Glucose-Capillary 111 (H) 70 - 99 mg/dL  POC Hbg N0U  Result Value Ref Range   Hemoglobin A1C 6.7 (A) 4.0 - 5.6 %   HbA1c POC (<> result, manual entry)     HbA1c, POC (prediabetic range)     HbA1c, POC (controlled diabetic range)      Last CBC Lab Results  Component Value Date   WBC 7.3 10/28/2023   HGB 14.0 10/28/2023   HCT 40.6 10/28/2023   MCV 86.4 10/28/2023   MCH 29.8 10/28/2023   RDW 13.2 10/28/2023   PLT 253 10/28/2023   Last metabolic panel Lab Results  Component Value Date   GLUCOSE 126 (H) 10/28/2023   NA 138 10/28/2023   K 3.7 10/28/2023   CL 97 (L) 10/28/2023   CO2 26 10/28/2023   BUN 14 10/28/2023   CREATININE 1.43 (H) 10/28/2023   GFRNONAA 45 (L) 10/28/2023   CALCIUM 9.9 10/28/2023   PHOS 4.0 03/06/2015   PROT 7.8 10/28/2023   ALBUMIN 4.1 10/28/2023   LABGLOB 2.5 06/15/2022   AGRATIO 1.6 06/15/2022   BILITOT 1.1 10/28/2023   ALKPHOS 68 10/28/2023   AST 19 10/28/2023   ALT 18 10/28/2023   ANIONGAP 15 10/28/2023   Last lipids Lab Results  Component Value Date   CHOL 96 (L) 07/15/2023   HDL 39 (L) 07/15/2023   LDLCALC 44 07/15/2023   TRIG 56 07/15/2023   CHOLHDL 2.5 07/15/2023   Last hemoglobin A1c Lab Results  Component Value Date  HGBA1C 6.7 (A) 11/04/2023      The ASCVD Risk score (Arnett DK, et al., 2019) failed to calculate for the following reasons:   The valid total cholesterol range is 130 to 320 mg/dL    Assessment & Plan:  Discussed with Dr. Mikey Bussing  Problem List Items Addressed This Visit       Cardiovascular and Mediastinum   Hypertension (Chronic)   BP Readings from  Last 3 Encounters:  11/04/23 112/74  10/29/23 (!) 156/95  10/04/23 114/84   Patient is currently on amlodipine 10 mg and Coreg 6.25 mg twice daily.  Her blood pressure is 112/74 today, no chest pain or shortness of breath.  No dizziness or lightheadedness.  Will continue the current regimen.  -Continue amlodipine 10 mg daily and Coreg 6.25 mg twice daily. -Will repeat BMP at this visit, as in the ED she had an AKI.        Digestive   Viral gastroenteritis   Patient was recently seen in ED on 12/13 for multiple episodes of nausea, vomiting, few days of diarrhea.  She was treated with IV fluids and given Zofran.  Patient reports that her symptoms started 2 weeks ago, she returned from a gathering on Sunday, her symptoms started right afterwards.  She reports multiple episodes of nausea and vomiting.  She also reported few episodes of diarrhea with couple stools that showed streaks of blood as she wiped.  Patient also noted cough during these episodes but no congestion or sore throat.    Presently, she states that her nausea and vomiting has resolved.  She has not had these episodes for the past couple of days.  She reports that she had a bowel movement yesterday with formed stools yesterday.  She checked her temperature yesterday was 99.1.  She is tolerating diet well, she states that she is eating softer food currently.  She has not had any bright red blood in her bowels this week.  Likely her symptoms were due to a viral that resulted in GI symptoms.  Plan: -Patient is recommended supportive care at home with fluid hydration and softer diet and advance as patient tolerates.      Relevant Orders   BMP8+Anion Gap     Endocrine   Diabetes mellitus type 2, noninsulin dependent (HCC)   Lab Results  Component Value Date   HGBA1C 6.7 (A) 11/04/2023    Her last A1c checked on 07/15/2023 showed an A1c of 6.9, today her A1c is at 6.7.  She is on currently metformin 1000 mg twice daily.  She is  also taking Trulicity 3.0 mg weekly.  She is adherence to her current regimen.  Denies any polydipsia.  She is due for a foot exam today and would like an ophthalmology referral.  No other concerns at this time.  Plan: -Continue metformin at 1000 mg twice daily -Continue Trulicity 3.0 mg weekly -Will do a foot exam during this visit -Will send an ophthalmology referral. -Will plan to check your A1c in about 4 to 6 weeks.        Other   Hyperlipidemia LDL goal <70 (Chronic)   Lab Results  Component Value Date   LDLCALC 44 07/15/2023   Patient is currently taken atorvastatin 40 mg daily.  Her LDL was 44 checked on 07/15/2023.  No other concerns at this time.  -Atorvastatin 40 mg daily -Will repeat your LDL in about 6 to 8 months.      Other Visit Diagnoses  Type 2 diabetes mellitus without complication, without long-term current use of insulin (HCC)    -  Primary   Relevant Orders   POC Hbg A1C (Completed)   Ambulatory referral to Ophthalmology       Return in about 6 months (around 05/04/2024) for A1c .    Jeral Pinch, DO

## 2023-11-04 NOTE — Patient Instructions (Addendum)
Thank you, [name] for allowing [place] to provide your care today. Today we discussed :  For our blood pressure - Continue taking Amlodipine 10 mg daily and Coreg 6.25 mg twice daily  For your diabetes - Continue taking metformin 1000 mg two times daily - Continue taking Trulicity 3.0 mg once a week - I sent a referral for your eyes to be checked   For your high Cholesterol  - Continue taking atorvastatin 40 mg daily   I will check your kidney functions today, otherwise continue drinking fluids, advance your diet as you tolerate.    I have ordered the following labs for you:  Lab Orders         BMP8+Anion Gap         Glucose, capillary         POC Hbg A1C      Tests ordered today:  As above   Referrals ordered today:   Referral Orders         Ambulatory referral to Ophthalmology       I have ordered the following medication/changed the following medications:   Stop the following medications: There are no discontinued medications.   Start the following medications: No orders of the defined types were placed in this encounter.    Follow up:  6 months A1c and HTN      Remember:   Should you have any questions or concerns please call the internal medicine clinic at [phone].     Jeral Pinch, DO [facility]

## 2023-11-04 NOTE — Assessment & Plan Note (Addendum)
Lab Results  Component Value Date   HGBA1C 6.7 (A) 11/04/2023    Her last A1c checked on 07/15/2023 showed an A1c of 6.9, today her A1c is at 6.7.  She is on currently metformin 1000 mg twice daily.  She is also taking Trulicity 3.0 mg weekly.  She is adherence to her current regimen.  Denies any polydipsia.  She is due for a foot exam today and would like an ophthalmology referral.  No other concerns at this time.  Plan: -Continue metformin at 1000 mg twice daily -Continue Trulicity 3.0 mg weekly -Will do a foot exam during this visit -Will send an ophthalmology referral. -Will plan to check your A1c in about 4 to 6 weeks.

## 2023-11-06 LAB — BMP8+ANION GAP
Anion Gap: 18 mmol/L (ref 10.0–18.0)
BUN/Creatinine Ratio: 11 (ref 9–23)
BUN: 12 mg/dL (ref 6–24)
CO2: 25 mmol/L (ref 20–29)
Calcium: 9.6 mg/dL (ref 8.7–10.2)
Chloride: 97 mmol/L (ref 96–106)
Creatinine, Ser: 1.11 mg/dL — ABNORMAL HIGH (ref 0.57–1.00)
Glucose: 112 mg/dL — ABNORMAL HIGH (ref 70–99)
Potassium: 3.6 mmol/L (ref 3.5–5.2)
Sodium: 140 mmol/L (ref 134–144)
eGFR: 61 mL/min/{1.73_m2} (ref >59)

## 2023-11-07 NOTE — Progress Notes (Signed)
Internal Medicine Clinic Attending  I was physically present during the key portions of the resident provided service and participated in the medical decision making of patient's management care. I reviewed pertinent patient test results.  The assessment, diagnosis, and plan were formulated together and I agree with the documentation in the resident's note.  Gust Rung, DO

## 2023-11-11 NOTE — Progress Notes (Signed)
Internal Medicine Clinic Attending  Case and documentation reviewed.  I reviewed the AWV findings.  I agree with the assessment, diagnosis, and plan of care documented in the AWV note.     

## 2023-12-07 ENCOUNTER — Other Ambulatory Visit: Payer: Self-pay | Admitting: Student

## 2023-12-07 DIAGNOSIS — E119 Type 2 diabetes mellitus without complications: Secondary | ICD-10-CM

## 2023-12-07 DIAGNOSIS — I10 Essential (primary) hypertension: Secondary | ICD-10-CM

## 2023-12-07 NOTE — Telephone Encounter (Signed)
Medication sent to pharmacy  

## 2023-12-13 ENCOUNTER — Encounter: Payer: Self-pay | Admitting: Hematology

## 2023-12-13 ENCOUNTER — Encounter: Payer: Self-pay | Admitting: Gastroenterology

## 2023-12-14 ENCOUNTER — Encounter: Payer: Self-pay | Admitting: Gastroenterology

## 2023-12-20 ENCOUNTER — Other Ambulatory Visit: Payer: Self-pay | Admitting: Student

## 2023-12-20 DIAGNOSIS — E119 Type 2 diabetes mellitus without complications: Secondary | ICD-10-CM

## 2023-12-26 ENCOUNTER — Encounter: Payer: Self-pay | Admitting: Student

## 2023-12-27 ENCOUNTER — Encounter: Payer: Self-pay | Admitting: Gastroenterology

## 2023-12-28 ENCOUNTER — Encounter: Payer: Self-pay | Admitting: Hematology

## 2024-01-02 ENCOUNTER — Encounter: Payer: Self-pay | Admitting: Gastroenterology

## 2024-01-04 ENCOUNTER — Ambulatory Visit (HOSPITAL_COMMUNITY): Payer: Medicare Other

## 2024-01-12 ENCOUNTER — Encounter: Payer: Self-pay | Admitting: Hematology

## 2024-01-12 ENCOUNTER — Inpatient Hospital Stay: Payer: Medicare Other | Attending: Hematology

## 2024-01-12 ENCOUNTER — Inpatient Hospital Stay: Payer: Medicare Other | Admitting: Hematology

## 2024-01-12 VITALS — BP 122/84 | HR 80 | Temp 97.6°F | Resp 16 | Wt 205.2 lb

## 2024-01-12 DIAGNOSIS — Z08 Encounter for follow-up examination after completed treatment for malignant neoplasm: Secondary | ICD-10-CM | POA: Insufficient documentation

## 2024-01-12 DIAGNOSIS — D649 Anemia, unspecified: Secondary | ICD-10-CM | POA: Insufficient documentation

## 2024-01-12 DIAGNOSIS — C183 Malignant neoplasm of hepatic flexure: Secondary | ICD-10-CM | POA: Diagnosis not present

## 2024-01-12 DIAGNOSIS — D500 Iron deficiency anemia secondary to blood loss (chronic): Secondary | ICD-10-CM

## 2024-01-12 DIAGNOSIS — Z85038 Personal history of other malignant neoplasm of large intestine: Secondary | ICD-10-CM | POA: Diagnosis present

## 2024-01-12 DIAGNOSIS — C184 Malignant neoplasm of transverse colon: Secondary | ICD-10-CM

## 2024-01-12 LAB — CMP (CANCER CENTER ONLY)
ALT: 23 U/L (ref 0–44)
AST: 15 U/L (ref 15–41)
Albumin: 4.2 g/dL (ref 3.5–5.0)
Alkaline Phosphatase: 88 U/L (ref 38–126)
Anion gap: 7 (ref 5–15)
BUN: 8 mg/dL (ref 6–20)
CO2: 33 mmol/L — ABNORMAL HIGH (ref 22–32)
Calcium: 9.5 mg/dL (ref 8.9–10.3)
Chloride: 102 mmol/L (ref 98–111)
Creatinine: 0.71 mg/dL (ref 0.44–1.00)
GFR, Estimated: >60 mL/min (ref >60)
Glucose, Bld: 129 mg/dL — ABNORMAL HIGH (ref 70–99)
Potassium: 3.9 mmol/L (ref 3.5–5.1)
Sodium: 142 mmol/L (ref 135–145)
Total Bilirubin: 0.4 mg/dL (ref 0.0–1.2)
Total Protein: 7.2 g/dL (ref 6.5–8.1)

## 2024-01-12 LAB — CBC WITH DIFFERENTIAL (CANCER CENTER ONLY)
Abs Immature Granulocytes: 0.01 10*3/uL (ref 0.00–0.07)
Basophils Absolute: 0 10*3/uL (ref 0.0–0.1)
Basophils Relative: 0 %
Eosinophils Absolute: 0.1 10*3/uL (ref 0.0–0.5)
Eosinophils Relative: 1 %
HCT: 35.5 % — ABNORMAL LOW (ref 36.0–46.0)
Hemoglobin: 11.8 g/dL — ABNORMAL LOW (ref 12.0–15.0)
Immature Granulocytes: 0 %
Lymphocytes Relative: 33 %
Lymphs Abs: 1.8 10*3/uL (ref 0.7–4.0)
MCH: 29.2 pg (ref 26.0–34.0)
MCHC: 33.2 g/dL (ref 30.0–36.0)
MCV: 87.9 fL (ref 80.0–100.0)
Monocytes Absolute: 0.5 10*3/uL (ref 0.1–1.0)
Monocytes Relative: 9 %
Neutro Abs: 3.1 10*3/uL (ref 1.7–7.7)
Neutrophils Relative %: 57 %
Platelet Count: 232 10*3/uL (ref 150–400)
RBC: 4.04 MIL/uL (ref 3.87–5.11)
RDW: 14.3 % (ref 11.5–15.5)
WBC Count: 5.4 10*3/uL (ref 4.0–10.5)
nRBC: 0 % (ref 0.0–0.2)

## 2024-01-12 LAB — FERRITIN: Ferritin: 80 ng/mL (ref 11–307)

## 2024-01-12 LAB — IRON AND IRON BINDING CAPACITY (CC-WL,HP ONLY)
Iron: 74 ug/dL (ref 28–170)
Saturation Ratios: 25 % (ref 10.4–31.8)
TIBC: 293 ug/dL (ref 250–450)
UIBC: 219 ug/dL (ref 148–442)

## 2024-01-12 LAB — CEA (ACCESS): CEA (CHCC): 1.21 ng/mL (ref 0.00–5.00)

## 2024-01-12 NOTE — Progress Notes (Signed)
 [facility]   Telephone:([phone] Fax:([phone]   Clinic Follow up Note   Patient Care Team: Marrianne Mood, MD as PCP - General Hanley Seamen, Dustin Folks, MD as Referring Physician (Optometry) Malachy Mood, MD as Consulting Physician (Oncology) Karie Soda, MD as Consulting Physician (General Surgery) Armbruster, Willaim Rayas, MD as Consulting Physician (Gastroenterology) Carver Fila, MD as Consulting Physician (Gynecologic Oncology)  Date of Service:  01/12/2024  CHIEF COMPLAINT: f/u of colon cancer  CURRENT THERAPY:  Cancer surveillance  Oncology History   Primary cancer of hepatic flexure of colon (HCC) pT3N0M0, stage II, MSS --Patient presents with iron deficient anemia since 06/2022, and received IV iron and EPO in hospital, she was referred to GI at that time. -She finally had colonoscopy and the EGD on December 14, 2022, which showed a large mass in proximal transverse colon, and a biopsy confirmed adenocarcinoma.  I discussed the findings with her in detail. -Her staging CT scan will showed a large ovarian cyst, and bilateral adrenal gland nodules, no other definitive evidence of node or distant metastasis.  CT scan of the adrenal cortical showed adrenal adenoma. -She underwent right hemicolectomy and lymph node biopsy in early May 2024, I reviewed her surgical pathology findings with patient.  She had a T3 lesion, 428 lymph nodes were negative, surgical margins were negative, no high risk features.  -she is clinically doing well, lab reviewed, exam was unremarkable, there is no clinical concern for recurrence -Continue cancer surveillance, plan to repeat surveillance CT scan in February 2025.   Assessment and Plan    Stage II Colon Cancer Follow-up for stage II colon cancer diagnosed in 2023. No current gastrointestinal symptoms. Appetite is variable but weight is stable. Discussed the importance of the upcoming CT scan and colonoscopy for monitoring  disease progression and potential recurrence. Patient consents to these procedures. - Order CT scan on March 4th at 3 PM - Perform colonoscopy on April 8th - Review CT scan results with patient - Schedule next follow-up visit in six months  Anemia Follow-up for anemia. No specific symptoms or concerns related to anemia were discussed. Patient's weight is stable with variable appetite. -Hemoglobin 11.8 today, slightly lower than before, study still pending.  General Health Maintenance No specific general health maintenance issues were discussed. - Ensure patient follows up with scheduling for appointments   Plan  -Lab reviewed, some are still pending.   -She is scheduled for CT scan next week, we will call her with results  -Lab and follow-up visit in six months.         SUMMARY OF ONCOLOGIC HISTORY: Oncology History  Primary cancer of hepatic flexure of colon (HCC)  12/21/2022 Imaging    IMPRESSION: 1. Colon is minimally distended and no discrete colonic lesion is identified to correspond with patient's given history of colonic neoplasm. 2. Bilateral adrenal nodules measuring 12 mm in the right adrenal gland and 1 cm in the left adrenal gland are new from CT August 20 2015. These likely reflect benign adrenal adenomas however they are technically indeterminate and small metastatic lesions are not excluded, consider more definitive characterization with pre and postcontrast adrenal protocol CT or MRI versus attention on follow-up oncologic imaging. 3. Tiny bilateral pulmonary nodules along the major fissures measuring up to 2 mm are nonspecific but favored to reflect intrapulmonary lymph nodes. However, given lack of prior imaging for comparison these warrant attention on short-term interval follow-up dedicated chest CT. 4. 7.7 cm left ovarian cyst,  recommend further evaluation by dedicated pelvic ultrasound. 5. Nonobstructive bilateral renal stones measure up to 4 mm in  the right kidney.   12/26/2022 Initial Diagnosis   Cancer of transverse colon (HCC)   03/24/2023 Cancer Staging   Staging form: Colon and Rectum, AJCC 8th Edition - Pathologic stage from 03/24/2023: Stage IIA (pT3, pN0, cM0) - Signed by Malachy Mood, MD on 04/05/2023 Total positive nodes: 0 Histologic grading system: 4 grade system Histologic grade (G): G2 Residual tumor (R): R0 - None      Discussed the use of AI scribe software for clinical note transcription with the patient, who gave verbal consent to proceed.  History of Present Illness   The patient, a 53 year old with a history of stage two colon cancer and anemia, reports feeling well overall. She has scheduled a colonoscopy for April, with the prep in March. She also has a CT scan scheduled for the following week. She reports no current stomach issues, such as pain or bloating. Her appetite is variable, with some days of good eating and others where she struggles to eat. Despite this, her weight remains stable. She also reports a mental health condition, for which she is seeing a psychiatrist.         All other systems were reviewed with the patient and are negative.  MEDICAL HISTORY:  Past Medical History:  Diagnosis Date   Anemia    Anxiety    Congenital cerebral palsy (HCC)    Depression    Diabetes mellitus due to abnormal insulin (HCC)    Dyspnea    Edema 06/10/2017   Endometriosis    Hearing impairment    Heart murmur    at birth   History of colonic polyps 12/28/2022   History of endometriosis 03/24/2023   Hypertension    Neuromuscular disorder (HCC)    Cerebral palsy- very mild left side of brain   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 US Neck 11/06/14: Bilateral nodules. Dominant right lower  pole nodule measures 2.3 cm. Findings meet consensus criteria for biopsy. Ultrasound-guided fine needle aspiration should be considered Aspiration pathology: benign follicular nodule   Transfusion of blood product refused for religious reason 12/28/2022    SURGICAL HISTORY: Past Surgical History:  Procedure Laterality Date   CESAREAN SECTION     EXPLORATORY LAPAROTOMY WITH ABDOMINAL MASS EXCISION     MYOMECTOMY N/A 08/05/2015   Procedure: Abdominal Hysterectomy, Bilateral Salpingectomy;  Surgeon: Tereso Newcomer, MD;  Location: [facility];  Service: Gynecology;  Laterality: N/A;   ROBOTIC ASSISTED SALPINGO OOPHERECTOMY Bilateral 03/24/2023   Procedure: XI ROBOTIC ASSISTED  BILATERAL  OOPHORECTOMY, LYSIS OF ADHESIONS;  Surgeon: Carver Fila, MD;  Location: [facility];  Service: Gynecology;  Laterality: Bilateral;    I have reviewed the social history and family history with the patient and they are unchanged from previous note.  ALLERGIES:  is allergic to bactrim [sulfamethoxazole-trimethoprim], lisinopril, losartan, bee pollen, ivp dye [iodinated contrast media], metrizamide, pollen extract, sulfa antibiotics, sulfasalazine, other, and peanut-containing drug products.  MEDICATIONS:  Current Outpatient Medications  Medication Sig Dispense Refill   acetaminophen (TYLENOL) 500 MG tablet Take 1,000 mg by mouth every 6 (six) hours as needed for moderate pain.  amLODipine (NORVASC) 10 MG tablet TAKE 1 TABLET BY MOUTH DAILY 28 tablet 3   atorvastatin (LIPITOR) 40 MG tablet TAKE 1 TABLET BY MOUTH DAILY 90 tablet 3   benzonatate (TESSALON PERLES) 100 MG capsule Take 1 capsule (100 mg total) by mouth 3 (three) times daily as needed for cough. (Patient taking differently: Take 100 mg by mouth daily as needed for cough.) 20 capsule 0   benztropine (COGENTIN) 1 MG tablet Take 1 mg by mouth daily.     bisacodyl (DULCOLAX) 5 MG EC tablet Take 5 mg by mouth daily as needed.     carvedilol (COREG) 6.25 MG  tablet TAKE 1 TABLET BY MOUTH TWICE A DAY WITH MEALS 56 tablet 3   diphenhydrAMINE (BENADRYL) 50 MG tablet Take 50 mg of Benadryl 1 hour prior to your CT scan 1 tablet 0   DULoxetine (CYMBALTA) 30 MG capsule Take 1 capsule (30 mg total) by mouth daily. 30 capsule 3   ferrous sulfate 325 (65 FE) MG EC tablet Take 1 tablet (325 mg total) by mouth every other day. 15 tablet 2   fluticasone (FLONASE) 50 MCG/ACT nasal spray Place 2 sprays into both nostrils daily as needed for allergies or rhinitis.     guaiFENesin (MUCINEX) 600 MG 12 hr tablet Take 1 tablet (600 mg total) by mouth 2 (two) times daily. 14 tablet 1   Insulin Pen Needle (BD PEN NEEDLE NANO U/F) 32G X 4 MM MISC USE TO INJECT VICTOZA ONCE DAILY 100 each 1   metFORMIN (GLUCOPHAGE) 1000 MG tablet TAKE 1 TABLET BY MOUTH TWICE A DAY 56 tablet 3   miconazole (MICATIN) 2 % cream Apply 1 Application topically 2 (two) times daily. 28.35 g 0   Multiple Vitamins-Minerals (HAIR SKIN & NAILS) TABS Take 1 tablet by mouth daily.     omeprazole (PRILOSEC) 40 MG capsule Take 1 capsule (40 mg total) by mouth daily. 30 capsule 1   ondansetron (ZOFRAN-ODT) 4 MG disintegrating tablet Take 1 tablet (4 mg total) by mouth every 8 (eight) hours as needed for nausea or vomiting. 10 tablet 0   Paliperidone ER (INVEGA SUSTENNA) injection Inject 117 mg into the muscle every 30 (thirty) days.     TRULICITY 1.5 MG/0.5ML SOAJ INJECT 0.5ML INTO THE SKIN ONCE A WEEK 2 mL 3   No current facility-administered medications for this visit.    PHYSICAL EXAMINATION: ECOG PERFORMANCE STATUS: 0 - Asymptomatic  Vitals:   01/12/24 0924  BP: 122/84  Pulse: 80  Resp: 16  Temp: 97.6 F (36.4 C)  SpO2: 99%   Wt Readings from Last 3 Encounters:  01/12/24 205 lb 3.2 oz (93.1 kg)  11/04/23 195 lb 4.8 oz (88.6 kg)  10/28/23 201 lb (91.2 kg)     GENERAL:alert, no distress and comfortable SKIN: skin color, texture, turgor are normal, no rashes or significant lesions EYES:  normal, Conjunctiva are pink and non-injected, sclera clear NECK: supple, thyroid normal size, non-tender, without nodularity LYMPH:  no palpable lymphadenopathy in the cervical, axillary  LUNGS: clear to auscultation and percussion with normal breathing effort HEART: regular rate & rhythm and no murmurs and no lower extremity edema ABDOMEN:abdomen soft, non-tender and normal bowel sounds Musculoskeletal:no cyanosis of digits and no clubbing  NEURO: alert & oriented x 3 with fluent speech, no focal motor/sensory deficits    LABORATORY DATA:  I have reviewed the data as listed    Latest Ref Rng & Units 01/12/2024    8:52 AM 10/28/2023  6:59 PM 10/04/2023    8:33 AM  CBC  WBC 4.0 - 10.5 K/uL 5.4  7.3  5.4   Hemoglobin 12.0 - 15.0 g/dL 16.1  09.6  04.5   Hematocrit 36.0 - 46.0 % 35.5  40.6  36.5   Platelets 150 - 400 K/uL 232  253  243         Latest Ref Rng & Units 01/12/2024    8:52 AM 11/04/2023    9:45 AM 10/28/2023    6:59 PM  CMP  Glucose 70 - 99 mg/dL 409  811  914   BUN 6 - 20 mg/dL 8  12  14    Creatinine 0.44 - 1.00 mg/dL 7.82  9.56  2.13   Sodium 135 - 145 mmol/L 142  140  138   Potassium 3.5 - 5.1 mmol/L 3.9  3.6  3.7   Chloride 98 - 111 mmol/L 102  97  97   CO2 22 - 32 mmol/L 33  25  26   Calcium 8.9 - 10.3 mg/dL 9.5  9.6  9.9   Total Protein 6.5 - 8.1 g/dL 7.2   7.8   Total Bilirubin 0.0 - 1.2 mg/dL 0.4   1.1   Alkaline Phos 38 - 126 U/L 88   68   AST 15 - 41 U/L 15   19   ALT 0 - 44 U/L 23   18       RADIOGRAPHIC STUDIES: I have personally reviewed the radiological images as listed and agreed with the findings in the report. No results found.    No orders of the defined types were placed in this encounter.  All questions were answered. The patient knows to call the clinic with any problems, questions or concerns. No barriers to learning was detected. The total time spent in the appointment was 15 minutes.     Malachy Mood, MD 01/12/2024

## 2024-01-12 NOTE — Assessment & Plan Note (Signed)
 pT3N0M0, stage II, MSS --Patient presents with iron deficient anemia since 06/2022, and received IV iron and EPO in hospital, she was referred to GI at that time. -She finally had colonoscopy and the EGD on December 14, 2022, which showed a large mass in proximal transverse colon, and a biopsy confirmed adenocarcinoma.  I discussed the findings with her in detail. -Her staging CT scan will showed a large ovarian cyst, and bilateral adrenal gland nodules, no other definitive evidence of node or distant metastasis.  CT scan of the adrenal cortical showed adrenal adenoma. -She underwent right hemicolectomy and lymph node biopsy in early May 2024, I reviewed her surgical pathology findings with patient.  She had a T3 lesion, 428 lymph nodes were negative, surgical margins were negative, no high risk features.  -she is clinically doing well, lab reviewed, exam was unremarkable, there is no clinical concern for recurrence -Continue cancer surveillance, plan to repeat surveillance CT scan in February 2025.

## 2024-01-17 ENCOUNTER — Telehealth: Payer: Self-pay | Admitting: Gastroenterology

## 2024-01-17 ENCOUNTER — Ambulatory Visit (HOSPITAL_COMMUNITY): Payer: Medicare Other

## 2024-01-17 NOTE — Telephone Encounter (Signed)
 Patient son called and stated that his mother missed her appointment to get a CT contrast done and is needing to get that reschedule. Patient son is requesting a call back. Please advise.

## 2024-01-17 NOTE — Telephone Encounter (Signed)
 Returned call to Levin Bacon provided Ontre with phone number to radiology scheduling [phone]). He will reschedule patient's CT appt to a time that is more convenient for them.

## 2024-01-19 ENCOUNTER — Telehealth: Payer: Self-pay | Admitting: Nurse Practitioner

## 2024-01-19 NOTE — Telephone Encounter (Signed)
 Patient has missed scheduled scan on 01/17/2024, patient has stated that due to transportation they were unable to make it, patient was given the number Radiology/Scans and she has stated that she will reschedule the scan, message has been forwarded over to provider

## 2024-01-25 ENCOUNTER — Telehealth: Payer: Self-pay

## 2024-01-25 NOTE — Telephone Encounter (Signed)
 Received a fax from the pharmacy for Trulicity, the patients insurance will not cover Trulicity the covered alternatives are ozempic or mounjaro.

## 2024-01-27 ENCOUNTER — Ambulatory Visit (HOSPITAL_COMMUNITY)
Admission: RE | Admit: 2024-01-27 | Discharge: 2024-01-27 | Disposition: A | Discharge status: Home / Self Care | Source: Ambulatory Visit | Attending: Nurse Practitioner | Admitting: Nurse Practitioner

## 2024-01-27 DIAGNOSIS — C183 Malignant neoplasm of hepatic flexure: Secondary | ICD-10-CM | POA: Diagnosis present

## 2024-01-27 MED ORDER — SEMAGLUTIDE(0.25 OR 0.5MG/DOS) 2 MG/3ML ~~LOC~~ SOPN: 0.5000 mg | PEN_INJECTOR | SUBCUTANEOUS | 2 refills | Status: AC

## 2024-01-27 NOTE — Telephone Encounter (Signed)
 Please address.

## 2024-01-27 NOTE — Addendum Note (Signed)
 Addended by: Katheran James on: 01/27/2024 11:57 AM   Modules accepted: Orders

## 2024-01-27 NOTE — Telephone Encounter (Signed)
 As insurance is no longer covering trulicity, I will change her medicine to ozempic. I called the patient to discuss. Previous office notes suggest she was to take 3mg  dulaglutide weekly but she confirmed she is only taking 1.5 mg. Accordingly I will change her medicine to semaglutide 0.5mg  weekly. She will start this medicine next week when her current supply runs out. Pharamcy confirmed. I have asked her to make an appointment for 1-2 months from now to check on her. No current appointments on file.

## 2024-01-30 ENCOUNTER — Encounter: Payer: Self-pay | Admitting: Hematology

## 2024-01-30 NOTE — Telephone Encounter (Signed)
 I called the patient to schedule a follow up appointment, unable to reach the patient. Unable to lvm.

## 2024-01-31 ENCOUNTER — Other Ambulatory Visit: Payer: Self-pay | Admitting: Student

## 2024-02-01 NOTE — Telephone Encounter (Signed)
 Medication sent to pharmacy

## 2024-02-07 ENCOUNTER — Ambulatory Visit (AMBULATORY_SURGERY_CENTER): Payer: Medicare Other

## 2024-02-07 VITALS — Ht 64.0 in | Wt 205.0 lb

## 2024-02-07 DIAGNOSIS — Z85038 Personal history of other malignant neoplasm of large intestine: Secondary | ICD-10-CM

## 2024-02-07 MED ORDER — SUFLAVE 178.7 G PO SOLR: 1.0000 | Freq: Once | ORAL | 0 refills | Status: DC

## 2024-02-07 MED ORDER — SUFLAVE 178.7 G PO SOLR: 1.0000 | Freq: Once | ORAL | 0 refills | Status: AC

## 2024-02-07 NOTE — Addendum Note (Signed)
 Addended by: Jaquelyn Bitter on: 02/07/2024 10:41 AM   Modules accepted: Orders

## 2024-02-07 NOTE — Progress Notes (Signed)

## 2024-02-21 ENCOUNTER — Ambulatory Visit (AMBULATORY_SURGERY_CENTER): Payer: Medicare Other | Admitting: Gastroenterology

## 2024-02-21 ENCOUNTER — Encounter: Payer: Self-pay | Admitting: Gastroenterology

## 2024-02-21 VITALS — BP 126/85 | HR 75 | Temp 97.6°F | Resp 16 | Ht 64.0 in | Wt 205.0 lb

## 2024-02-21 DIAGNOSIS — Z85038 Personal history of other malignant neoplasm of large intestine: Secondary | ICD-10-CM | POA: Diagnosis not present

## 2024-02-21 DIAGNOSIS — Z1211 Encounter for screening for malignant neoplasm of colon: Secondary | ICD-10-CM | POA: Diagnosis not present

## 2024-02-21 DIAGNOSIS — K6289 Other specified diseases of anus and rectum: Secondary | ICD-10-CM | POA: Diagnosis not present

## 2024-02-21 DIAGNOSIS — K648 Other hemorrhoids: Secondary | ICD-10-CM | POA: Diagnosis not present

## 2024-02-21 DIAGNOSIS — Z980 Intestinal bypass and anastomosis status: Secondary | ICD-10-CM

## 2024-02-21 DIAGNOSIS — Z860101 Personal history of adenomatous and serrated colon polyps: Secondary | ICD-10-CM

## 2024-02-21 MED ORDER — SODIUM CHLORIDE 0.9 % IV SOLN: 500.0000 mL | Freq: Once | INTRAVENOUS | Status: DC

## 2024-02-21 NOTE — Op Note (Signed)
 [facility] Patient Name: [name] Procedure Date: 02/21/2024 8:38 AM MRN: [id number] Endoscopist: Sandy Spare P. Adela Lank , MD, [id number] Age: 53 Referring MD:  Date of Birth: [birth year] Gender: Female Account #: [id number] Procedure:                Colonoscopy Indications:              High risk colon cancer surveillance: Personal                            history of colon cancer - proximal transverse colon                            cancer dx 11/2022, s/p surgical resection, doing                            well postoperatively Medicines:                Monitored Anesthesia Care Procedure:                Pre-Anesthesia Assessment:                           - Prior to the procedure, a History and Physical                            was performed, and patient medications and                            allergies were reviewed. The patient's tolerance of                            previous anesthesia was also reviewed. The risks                            and benefits of the procedure and the sedation                            options and risks were discussed with the patient.                            All questions were answered, and informed consent                            was obtained. Prior Anticoagulants: The patient has                            taken no anticoagulant or antiplatelet agents. ASA                            Grade Assessment: III - A patient with severe                            systemic disease. After reviewing the risks and  benefits, the patient was deemed in satisfactory                            condition to undergo the procedure.                           After obtaining informed consent, the colonoscope                            was passed under direct vision. Throughout the                            procedure, the patient's blood pressure, pulse, and                            oxygen saturations were monitored  continuously. The                            Olympus CF-HQ190L ([id number]) Colonoscope was                            introduced through the anus and advanced to the the                            ileocolonic anastomosis. The colonoscopy was                            performed without difficulty. The patient tolerated                            the procedure well. The quality of the bowel                            preparation was good. The surgical anastmosis and                            the rectum were photographed. Scope In: 8:58:19 AM Scope Out: 9:09:04 AM Scope Withdrawal Time: 0 hours 8 minutes 42 seconds  Total Procedure Duration: 0 hours 10 minutes 45 seconds  Findings:                 The perianal and digital rectal examinations were                            normal.                           There was evidence of a prior end-to-end                            ileo-colonic anastomosis at the proximal transverse                            colon / hepatic flexure. This was patent and was  characterized by healthy appearing mucosa.                           The terminal ileum appeared normal.                           Internal hemorrhoids were found during retroflexion.                           Anal papilla(e) were hypertrophied.                           The exam was otherwise without abnormality. Complications:            No immediate complications. Estimated blood loss:                            None. Estimated Blood Loss:     Estimated blood loss: none. Impression:               - Patent end-to-end ileo-colonic anastomosis,                            characterized by healthy appearing mucosa.                           - The examined portion of the ileum was normal.                           - Internal hemorrhoids.                           - Anal papilla(e) were hypertrophied.                           - The examination was otherwise normal.                            - No polyps Recommendation:           - Patient has a contact number available for                            emergencies. The signs and symptoms of potential                            delayed complications were discussed with the                            patient. Return to normal activities tomorrow.                            Written discharge instructions were provided to the                            patient.                           -  Resume previous diet.                           - Continue present medications.                           - Repeat colonoscopy in 3 years for surveillance. Sandy Spare P. Merryn Thaker, MD 02/21/2024 9:14:16 AM This report has been signed electronically.

## 2024-02-21 NOTE — Progress Notes (Signed)
 [facility] History and Physical   Primary Care Physician:  Marrianne Mood, MD   Reason for Procedure:   History of colon cancer  Plan:    colonoscopy     HPI: [name] is a 53 y.o. 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   Patient denies any bowel symptoms at this time. Otherwise feels well without any cardiopulmonary symptoms.   I have discussed risks / benefits of anesthesia and endoscopic procedure with Rometta Emery and they wish to proceed with the exams as outlined today.    Past Medical History:  Diagnosis Date   Allergy    Anemia    Anxiety    Congenital cerebral palsy (HCC)    Depression    Diabetes mellitus due to abnormal insulin (HCC)    Dyspnea    Edema 06/10/2017   Endometriosis    Hearing impairment    Heart murmur    at birth   History of colonic polyps 12/28/2022   History of endometriosis 03/24/2023   Hyperlipidemia    Hypertension    Neuromuscular disorder (HCC)    Cerebral palsy- very mild left side of brain   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 US Neck 11/06/14: Bilateral nodules. Dominant right lower pole nodule measures 2.3 cm. Findings meet consensus criteria for biopsy. Ultrasound-guided fine needle aspiration should be considered Aspiration pathology: benign follicular nodule   Transfusion of blood product refused for religious reason 12/28/2022    Past Surgical History:  Procedure Laterality Date   CESAREAN SECTION     EXPLORATORY LAPAROTOMY WITH ABDOMINAL MASS EXCISION     MYOMECTOMY N/A 08/05/2015   Procedure: Abdominal Hysterectomy, Bilateral Salpingectomy;  Surgeon: Tereso Newcomer, MD;  Location: [facility];  Service: Gynecology;  Laterality: N/A;   ROBOTIC ASSISTED SALPINGO OOPHERECTOMY Bilateral 03/24/2023   Procedure: XI ROBOTIC ASSISTED  BILATERAL  OOPHORECTOMY, LYSIS OF ADHESIONS;  Surgeon: Carver Fila, MD;  Location: [facility];  Service: Gynecology;  Laterality: Bilateral;    Prior to Admission medications   Medication Sig Start Date End Date Taking? Authorizing Provider  acetaminophen (TYLENOL) 500 MG tablet Take 1,000 mg by mouth every 6 (six) hours as needed for moderate pain.   Yes [provider]  amLODipine (NORVASC) 10 MG tablet TAKE 1 TABLET BY MOUTH DAILY 12/07/23  Yes Marrianne Mood, MD  atorvastatin (LIPITOR) 40 MG tablet TAKE 1 TABLET BY MOUTH DAILY 02/01/24  Yes Marrianne Mood, MD  benztropine (COGENTIN) 1 MG tablet Take 1 mg by mouth daily. 09/20/23  Yes [provider]  bisacodyl (DULCOLAX) 5 MG EC tablet Take 5 mg by mouth daily as needed. 12/28/22  Yes [provider]  carvedilol (COREG) 6.25 MG tablet TAKE 1 TABLET BY MOUTH TWICE A DAY WITH MEALS 12/07/23  Yes Marrianne Mood, MD  diphenhydrAMINE (BENADRYL) 50 MG tablet Take 50 mg of Benadryl 1 hour prior to your CT scan 01/10/23  Yes Malachy Mood, MD  DULoxetine (CYMBALTA) 30 MG capsule Take 1 capsule (30 mg total) by mouth daily. 11/16/22  Yes Marrianne Mood, MD  fluticasone (FLONASE) 50 MCG/ACT nasal spray Place 2 sprays  into both nostrils daily as needed for allergies or rhinitis.   Yes [provider]  Insulin Pen Needle (BD PEN NEEDLE NANO U/F) 32G X 4 MM MISC USE TO INJECT VICTOZA ONCE DAILY 11/16/22  Yes Marrianne Mood, MD  metFORMIN (GLUCOPHAGE) 1000 MG tablet TAKE 1 TABLET BY MOUTH TWICE A DAY 12/07/23  Yes Marrianne Mood, MD  Multiple Vitamins-Minerals (HAIR SKIN & NAILS) TABS Take 1 tablet by mouth daily.   Yes [provider]  omeprazole (PRILOSEC) 40 MG capsule Take 1 capsule (40 mg total) by mouth daily. 12/07/22  Yes Sharod Petsch,  Willaim Rayas, MD  ondansetron (ZOFRAN-ODT) 4 MG disintegrating tablet Take 1 tablet (4 mg total) by mouth every 8 (eight) hours as needed for nausea or vomiting. 10/29/23  Yes Sponseller, Rebekah R, PA-C  ferrous sulfate 325 (65 FE) MG EC tablet Take 1 tablet (325 mg total) by mouth every other day. 11/16/22   Marrianne Mood, MD  miconazole (MICATIN) 2 % cream Apply 1 Application topically 2 (two) times daily. 04/12/23   Marolyn Haller, MD  Paliperidone ER [facility] SUSTENNA) injection Inject 117 mg into the muscle every 30 (thirty) days. Patient not taking: Reported on 02/07/2024    [provider]    Current Outpatient Medications  Medication Sig Dispense Refill   acetaminophen (TYLENOL) 500 MG tablet Take 1,000 mg by mouth every 6 (six) hours as needed for moderate pain.     amLODipine (NORVASC) 10 MG tablet TAKE 1 TABLET BY MOUTH DAILY 28 tablet 3   atorvastatin (LIPITOR) 40 MG tablet TAKE 1 TABLET BY MOUTH DAILY 28 tablet 3   benztropine (COGENTIN) 1 MG tablet Take 1 mg by mouth daily.     bisacodyl (DULCOLAX) 5 MG EC tablet Take 5 mg by mouth daily as needed.     carvedilol (COREG) 6.25 MG tablet TAKE 1 TABLET BY MOUTH TWICE A DAY WITH MEALS 56 tablet 3   diphenhydrAMINE (BENADRYL) 50 MG tablet Take 50 mg of Benadryl 1 hour prior to your CT scan 1 tablet 0   DULoxetine (CYMBALTA) 30 MG capsule Take 1 capsule (30 mg total) by mouth daily. 30 capsule 3   fluticasone (FLONASE) 50 MCG/ACT nasal spray Place 2 sprays into both nostrils daily as needed for allergies or rhinitis.     Insulin Pen Needle (BD PEN NEEDLE NANO U/F) 32G X 4 MM MISC USE TO INJECT VICTOZA ONCE DAILY 100 each 1   metFORMIN (GLUCOPHAGE) 1000 MG tablet TAKE 1 TABLET BY MOUTH TWICE A DAY 56 tablet 3   Multiple Vitamins-Minerals (HAIR SKIN & NAILS) TABS Take 1 tablet by mouth daily.     omeprazole (PRILOSEC) 40 MG capsule Take 1 capsule (40 mg total) by mouth daily. 30 capsule 1   ondansetron (ZOFRAN-ODT) 4 MG  disintegrating tablet Take 1 tablet (4 mg total) by mouth every 8 (eight) hours as needed for nausea or vomiting. 10 tablet 0   ferrous sulfate 325 (65 FE) MG EC tablet Take 1 tablet (325 mg total) by mouth every other day. 15 tablet 2   miconazole (MICATIN) 2 % cream Apply 1 Application topically 2 (two) times daily. 28.35 g 0   Paliperidone ER (INVEGA SUSTENNA) injection Inject 117 mg into the muscle every 30 (thirty) days. (Patient not taking: Reported on 02/07/2024)     Current Facility-Administered Medications  Medication Dose Route Frequency Provider Last Rate Last Admin   0.9 %  sodium chloride infusion  500 mL Intravenous Once Panayiotis [place], Willaim Rayas,  MD        Allergies as of 02/21/2024 - Review Complete 02/21/2024  Allergen Reaction Noted   Bactrim [sulfamethoxazole-trimethoprim] Anaphylaxis, Shortness Of Breath, and Swelling 08/20/2015   Sulfa antibiotics Swelling 08/20/2015   Sulfasalazine Swelling 08/20/2015   Ivp dye [iodinated contrast media] Nausea And Vomiting 11/07/2012   Lisinopril Cough 05/30/2020   Losartan Cough 05/30/2020   Metrizamide Nausea And Vomiting 11/07/2012   Bee pollen Other (See Comments) 03/04/2015   Other  07/22/2016   Peanut-containing drug products Rash 07/22/2016   Pollen extract Other (See Comments) 03/04/2015    Family History  Problem Relation Age of Onset   Diabetes Mother    Breast cancer Mother 30   Heart failure Mother    Hypertension Mother    Diabetes Mellitus II Mother    Colon cancer Father 28   Diabetes Mellitus II Father    Hypertension Father    Breast cancer Maternal Aunt 25   Lung cancer Maternal Grandmother    Esophageal cancer Neg Hx    Pancreatic cancer Neg Hx    Ulcerative colitis Neg Hx    Colon polyps Neg Hx    Rectal cancer Neg Hx    Stomach cancer Neg Hx     Social History   Socioeconomic History   Marital status: Married    Spouse name: Not on file   Number of children: 1   Years of education: Not on file    Highest education level: Not on file  Occupational History   Not on file  Tobacco Use   Smoking status: Never   Smokeless tobacco: Never  Vaping Use   Vaping status: Never Used  Substance and Sexual Activity   Alcohol use: No   Drug use: No   Sexual activity: Not Currently    Birth control/protection: Surgical  Other Topics Concern   Not on file  Social History Narrative   Not on file   Social Drivers of Health   Financial Resource Strain: Medium Risk (07/15/2023)   Overall Financial Resource Strain (CARDIA)    Difficulty of Paying Living Expenses: Somewhat hard  Food Insecurity: Low Risk  (08/10/2023)   Received from [facility]   Hunger Vital Sign    Worried About Running Out of Food in the Last Year: Never true    Ran Out of Food in the Last Year: Never true  Recent Concern: Food Insecurity - Food Insecurity Present (07/15/2023)   Hunger Vital Sign    Worried About Programme researcher, broadcasting/film/video in the Last Year: Sometimes true    Ran Out of Food in the Last Year: Sometimes true  Transportation Needs: No Transportation Needs (08/10/2023)   Received from [facility]    In the past 12 months, has lack of reliable transportation kept you from medical appointments, meetings, work or from getting things needed for daily living? : No  Physical Activity: Inactive (07/15/2023)   Exercise Vital Sign    Days of Exercise per Week: 0 days    Minutes of Exercise per Session: 0 min  Stress: No Stress Concern Present (07/15/2023)   [employer] of Occupational Health - Occupational Stress Questionnaire    Feeling of Stress : Only a little  Social Connections: Moderately Isolated (07/15/2023)   Social Connection and Isolation Panel [NHANES]    Frequency of Communication with Friends and Family: Twice a week    Frequency of Social Gatherings with Friends and Family: Once a week  Attends Religious Services: More than 4 times per year    Active Member of Clubs or  Organizations: No    Attends Banker Meetings: Never    Marital Status: Separated  Intimate Partner Violence: Not At Risk (07/15/2023)   Humiliation, Afraid, Rape, and Kick questionnaire    Fear of Current or Ex-Partner: No    Emotionally Abused: No    Physically Abused: No    Sexually Abused: No    Review of Systems: All other review of systems negative except as mentioned in the HPI.  Physical Exam: Vital signs BP 118/74   Pulse 92   Temp 97.6 F (36.4 C) (Temporal)   Ht 5\' 4"  (1.626 m)   Wt 205 lb (93 kg)   LMP 07/23/2015 (Exact Date)   SpO2 100%   BMI 35.19 kg/m   General:   Alert,  Well-developed, pleasant and cooperative in NAD Lungs:  Clear throughout to auscultation.   Heart:  Regular rate and rhythm Abdomen:  Soft, nontender and nondistended.   Neuro/Psych:  Alert and cooperative. Normal mood and affect. A and O x 3  Harlin Rain, MD [facility]

## 2024-02-21 NOTE — Progress Notes (Signed)
 Pt's states no medical or surgical changes since previsit or office visit.

## 2024-02-21 NOTE — Progress Notes (Signed)
 A/o x 3, VSS, gd SR's, pleased with anesthesia, report to RN

## 2024-02-21 NOTE — Patient Instructions (Signed)
 Please read handouts provided. Continue present medications. Resume previous diet. Repeat colonoscopy in 3 years for screening.   YOU HAD AN ENDOSCOPIC PROCEDURE TODAY AT THE [facility]:   Refer to the procedure report that was given to you for any specific questions about what was found during the examination.  If the procedure report does not answer your questions, please call your gastroenterologist to clarify.  If you requested that your care partner not be given the details of your procedure findings, then the procedure report has been included in a sealed envelope for you to review at your convenience later.  YOU SHOULD EXPECT: Some feelings of bloating in the abdomen. Passage of more gas than usual.  Walking can help get rid of the air that was put into your GI tract during the procedure and reduce the bloating. If you had a lower endoscopy (such as a colonoscopy or flexible sigmoidoscopy) you may notice spotting of blood in your stool or on the toilet paper. If you underwent a bowel prep for your procedure, you may not have a normal bowel movement for a few days.  Please Note:  You might notice some irritation and congestion in your nose or some drainage.  This is from the oxygen used during your procedure.  There is no need for concern and it should clear up in a day or so.  SYMPTOMS TO REPORT IMMEDIATELY:  Following lower endoscopy (colonoscopy or flexible sigmoidoscopy):  Excessive amounts of blood in the stool  Significant tenderness or worsening of abdominal pains  Swelling of the abdomen that is new, acute  Fever of 100F or higher.  For urgent or emergent issues, a gastroenterologist can be reached at any hour by calling [phone]. Do not use MyChart messaging for urgent concerns.    DIET:  We do recommend a small meal at first, but then you may proceed to your regular diet.  Drink plenty of fluids but you should avoid alcoholic beverages for 24  hours.  ACTIVITY:  You should plan to take it easy for the rest of today and you should NOT DRIVE or use heavy machinery until tomorrow (because of the sedation medicines used during the test).    FOLLOW UP: Our staff will call the number listed on your records the next business day following your procedure.  We will call around 7:15- 8:00 am to check on you and address any questions or concerns that you may have regarding the information given to you following your procedure. If we do not reach you, we will leave a message.     If any biopsies were taken you will be contacted by phone or by letter within the next 1-3 weeks.  Please call us at [phone] if you have not heard about the biopsies in 3 weeks.    SIGNATURES/CONFIDENTIALITY: You and/or your care partner have signed paperwork which will be entered into your electronic medical record.  These signatures attest to the fact that that the information above on your After Visit Summary has been reviewed and is understood.  Full responsibility of the confidentiality of this discharge information lies with you and/or your care-partner.

## 2024-02-22 ENCOUNTER — Telehealth: Payer: Self-pay

## 2024-02-22 NOTE — Telephone Encounter (Signed)
  Follow up Call-     02/21/2024    8:00 AM 12/14/2022   10:27 AM  Call back number  Post procedure Call Back phone  # [phone]  Permission to leave phone message Yes Yes     Patient questions:  Do you have a fever, pain , or abdominal swelling? No. Pain Score  0 *  Have you tolerated food without any problems? Yes.    Have you been able to return to your normal activities? Yes.    Do you have any questions about your discharge instructions: Diet   No. Medications  No. Follow up visit  No.  Do you have questions or concerns about your Care? No.  Actions: * If pain score is 4 or above: No action needed, pain <4.

## 2024-04-26 ENCOUNTER — Other Ambulatory Visit: Payer: Self-pay | Admitting: Student

## 2024-04-26 DIAGNOSIS — I10 Essential (primary) hypertension: Secondary | ICD-10-CM

## 2024-04-26 DIAGNOSIS — E119 Type 2 diabetes mellitus without complications: Secondary | ICD-10-CM

## 2024-04-26 NOTE — Telephone Encounter (Signed)
 Medication sent to pharmacy

## 2024-05-01 LAB — HM DIABETES EYE EXAM

## 2024-05-08 ENCOUNTER — Other Ambulatory Visit: Payer: Self-pay | Admitting: Student

## 2024-05-08 ENCOUNTER — Telehealth: Payer: Self-pay | Admitting: *Deleted

## 2024-05-08 DIAGNOSIS — E119 Type 2 diabetes mellitus without complications: Secondary | ICD-10-CM

## 2024-05-08 MED ORDER — TRULICITY 3 MG/0.5ML ~~LOC~~ SOAJ
3.0000 mg | SUBCUTANEOUS | 3 refills | Status: DC
Start: 2024-05-08 — End: 2024-05-08

## 2024-05-08 MED ORDER — TRULICITY 3 MG/0.5ML ~~LOC~~ SOAJ
3.0000 mg | SUBCUTANEOUS | 3 refills | Status: DC
Start: 2024-05-08 — End: 2024-05-09

## 2024-05-08 NOTE — Telephone Encounter (Signed)
 Called and clarified. She was on Trulicity --I think she mistook this for insulin . Refilled her Trulicity  at 3 mg weekly.  Ozell Kung MD 05/08/2024, 11:56 AM

## 2024-05-08 NOTE — Telephone Encounter (Signed)
 Source  [name] (Patient)   Subject  [name] (Patient)   Topic  Clinical - Prescription Issue    Communication  Reason for CRM: [facility] would like a call back at  [phone] to get medication clarification on the following medication: Dulaglutide  (TRULICITY ) 3 MG/0.5ML SOAJ

## 2024-05-08 NOTE — Telephone Encounter (Signed)
 Copied from CRM [id number]. Topic: Clinical - Prescription Issue >> May 08, 2024  9:47 AM Marda MATSU wrote: Patient [place] calling for a refill on her medication (insulin ) . She did not know the name of the medication and stated she did not have anything around her with the name of it.  Please advise

## 2024-05-08 NOTE — Addendum Note (Signed)
 Addended by: NORRINE SHARPER on: 05/08/2024 03:57 PM   Modules accepted: Orders

## 2024-05-08 NOTE — Telephone Encounter (Signed)
 I called [facility] - the pharmacist stated only clarification he sees is the [facility]. He stated they dispense per pen; 4 pens in a box. They need to know month supply or 3 months?

## 2024-06-08 ENCOUNTER — Ambulatory Visit: Payer: Self-pay | Admitting: Student

## 2024-06-08 ENCOUNTER — Ambulatory Visit (HOSPITAL_COMMUNITY)
Admission: RE | Admit: 2024-06-08 | Discharge: 2024-06-08 | Disposition: A | Discharge status: Home / Self Care | Source: Ambulatory Visit | Attending: Family Medicine | Admitting: Family Medicine

## 2024-06-08 ENCOUNTER — Other Ambulatory Visit: Payer: Self-pay

## 2024-06-08 VITALS — BP 100/62 | HR 83 | Temp 98.2°F | Ht 64.0 in | Wt 205.2 lb

## 2024-06-08 DIAGNOSIS — R058 Other specified cough: Secondary | ICD-10-CM | POA: Insufficient documentation

## 2024-06-08 DIAGNOSIS — E119 Type 2 diabetes mellitus without complications: Secondary | ICD-10-CM

## 2024-06-08 LAB — POCT GLYCOSYLATED HEMOGLOBIN (HGB A1C): Hemoglobin A1C: 8.1 % — AB (ref 4.0–5.6)

## 2024-06-08 LAB — GLUCOSE, CAPILLARY: Glucose-Capillary: 169 mg/dL — ABNORMAL HIGH (ref 70–99)

## 2024-06-08 MED ORDER — AMOXICILLIN-POT CLAVULANATE 875-125 MG PO TABS: 1.0000 | ORAL_TABLET | Freq: Two times a day (BID) | ORAL | 0 refills | Status: DC

## 2024-06-08 MED ORDER — AZITHROMYCIN 500 MG PO TABS: 500.0000 mg | ORAL_TABLET | Freq: Every day | ORAL | 0 refills | Status: AC

## 2024-06-08 MED ORDER — AZITHROMYCIN 500 MG PO TABS: 500.0000 mg | ORAL_TABLET | Freq: Every day | ORAL | 0 refills | Status: DC

## 2024-06-08 MED ORDER — AMOXICILLIN 500 MG PO CAPS: 1000.0000 mg | ORAL_CAPSULE | Freq: Three times a day (TID) | ORAL | 0 refills | Status: DC

## 2024-06-08 NOTE — Assessment & Plan Note (Addendum)
 Pt presents for an acute visit regarding a productive cough. She states that she went to bed on Sunday feeling fine, then woke up with a cough and headaches. Her cough has worsened in frequency and intensity unfortunately throughout the week, and she describes a thick mucus production. She does endorse subjective fevers and chills throughout the week as well since symptom onset. Furthermore, she is stating that she is having shortness of breath at rest as well as with ambulation. When she does cough she has some centralized chest pain, that is reproducible. Her son was recently sick last week with similar symptoms.   On my exam, she is moving air well but may have some decreased breath sounds in her right middle lobe.   Her symptoms seem consistent with a LRTI, potentially viral in nature. Will get chest x-ray, and CBC to evaluate for leukocytosis to determine antibiotic initiation. In the meanwhile discussed with patient to use things like tylenol , robitussin, and hydration to help improve symptoms.   Chest X-ray does show an infiltrate, so will treat as CAP with Augmentin BID for 5 days, and Azithromycin 500mg  for three days.   Plan:  - Augmentin 875-125mg  BID for 5 days  - Azithromycin 500mg  for three days

## 2024-06-08 NOTE — Assessment & Plan Note (Signed)
 Unfortunately unable to discuss her diabetes with her given time constraints and hearing constraints as she did not have her hearing aids. Have scheduled her to follow up in one week to discuss this.

## 2024-06-08 NOTE — Patient Instructions (Addendum)
 Thank you so much for coming to the clinic today!   Here's what were going to do:   I'm going to put in for a chest x-ray which you can get done today across the street at the hospital, through the radiology department  If your chest x-ray does show something, then I will send in antibiotics  We're also going to check labs today, and if there's something that shows up abnormal on there I will send in antibiotics IN the meanwhile, for your symptoms, if your chest x-ray looks good and blood work looks ok, it may be a viral infection For viral infections, the best thing to do is waiting it out while taking things like Tylenol  or robitussin to help with your symptoms.  And I'd like to see you back in one week to check in on your symptoms as well as talk about your diabetes  If you have any questions please feel free to the call the clinic at anytime at [phone]. It was a pleasure seeing you!  Best, Dr. Mavery Milling

## 2024-06-08 NOTE — Progress Notes (Signed)
 CC: Productive cough   HPI:  [name] is a 53 y.o. female living with a history stated below and presents today for productive cough. Please see problem based assessment and plan for additional details.  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 US  Neck 11/06/14: Bilateral nodules. Dominant right lower pole nodule measures 2.3 cm. Findings meet consensus criteria for biopsy. Ultrasound-guided fine needle aspiration should be considered Aspiration pathology: benign follicular nodule   Transfusion of blood product refused for religious reason 12/28/2022    Current Outpatient Medications on File Prior to Visit  Medication Sig Dispense Refill   acetaminophen  (TYLENOL ) 500 MG tablet Take 1,000 mg by mouth every 6 (six) hours as needed for moderate pain.     amLODipine  (NORVASC ) 10 MG tablet TAKE 1 TABLET BY MOUTH DAILY 28 tablet 3   atorvastatin  (LIPITOR) 40 MG tablet TAKE 1 TABLET BY MOUTH DAILY 28 tablet 3   benztropine  (COGENTIN ) 1 MG tablet Take 1 mg by mouth daily.     bisacodyl  (DULCOLAX) 5 MG EC tablet Take 5 mg by mouth daily as needed.     carvedilol  (COREG ) 6.25 MG tablet TAKE 1 TABLET BY MOUTH TWICE A DAY WITH MEALS 56 tablet 3   diphenhydrAMINE  (BENADRYL ) 50 MG tablet Take 50 mg of Benadryl  1  hour prior to your CT scan 1 tablet 0   DULoxetine  (CYMBALTA ) 30 MG capsule Take 1 capsule (30 mg total) by mouth daily. 30 capsule 3   ferrous sulfate  325 (65 FE) MG EC tablet Take 1 tablet (325 mg total) by mouth every other day. 15 tablet 2   fluticasone  (FLONASE ) 50 MCG/ACT nasal spray Place 2 sprays into both nostrils daily as needed for allergies or rhinitis.     Insulin  Pen Needle (BD PEN NEEDLE NANO U/F) 32G X 4 MM MISC USE TO INJECT VICTOZA  ONCE DAILY 100 each 1   metFORMIN  (GLUCOPHAGE ) 1000 MG tablet TAKE 1 TABLET BY MOUTH TWICE A DAY 56 tablet 3   miconazole  (MICATIN) 2 % cream Apply 1 Application topically 2 (two) times daily. 28.35 g 0   Multiple Vitamins-Minerals (HAIR SKIN & NAILS) TABS Take 1 tablet by mouth daily.     omeprazole  (PRILOSEC) 40 MG capsule Take 1 capsule (40 mg total) by mouth daily. 30 capsule 1   ondansetron  (ZOFRAN -ODT) 4 MG disintegrating tablet Take 1 tablet (4 mg total) by mouth every 8 (eight) hours as needed for nausea or vomiting. 10 tablet 0   Paliperidone ER (INVEGA SUSTENNA) injection Inject 117 mg into the muscle every 30 (thirty) days. (Patient not taking: Reported on 02/07/2024)  TRULICITY  3 MG/0.5ML SOAJ INJECT 3 MG AS DIRECTED ONCE A WEEK 4 mL 3   No current facility-administered medications on file prior to visit.    Family History  Problem Relation Age of Onset   Diabetes Mother    Breast cancer Mother 54   Heart failure Mother    Hypertension Mother    Diabetes Mellitus II Mother    Colon cancer Father 12   Diabetes Mellitus II Father    Hypertension Father    Breast cancer Maternal Aunt 10   Lung cancer Maternal Grandmother    Esophageal cancer Neg Hx    Pancreatic cancer Neg Hx    Ulcerative colitis Neg Hx    Colon polyps Neg Hx    Rectal cancer Neg Hx    Stomach cancer Neg Hx     Social History   Socioeconomic History   Marital status: Married    Spouse name: Not on file   Number of children: 1   Years of education: Not  on file   Highest education level: Not on file  Occupational History   Not on file  Tobacco Use   Smoking status: Never   Smokeless tobacco: Never  Vaping Use   Vaping status: Never Used  Substance and Sexual Activity   Alcohol use: No   Drug use: No   Sexual activity: Not Currently    Birth control/protection: Surgical  Other Topics Concern   Not on file  Social History Narrative   Not on file   Social Drivers of Health   Financial Resource Strain: Medium Risk (07/15/2023)   Overall Financial Resource Strain (CARDIA)    Difficulty of Paying Living Expenses: Somewhat hard  Food Insecurity: Low Risk  (08/10/2023)   Received from [facility]   Hunger Vital Sign    Within the past 12 months, you worried that your food would run out before you got money to buy more: Never true    Within the past 12 months, the food you bought just didn't last and you didn't have money to get more. : Never true  Recent Concern: Food Insecurity - Food Insecurity Present (07/15/2023)   Hunger Vital Sign    Worried About Running Out of Food in the Last Year: Sometimes true    Ran Out of Food in the Last Year: Sometimes true  Transportation Needs: No Transportation Needs (08/10/2023)   Received from [facility]    In the past 12 months, has lack of reliable transportation kept you from medical appointments, meetings, work or from getting things needed for daily living? : No  Physical Activity: Inactive (07/15/2023)   Exercise Vital Sign    Days of Exercise per Week: 0 days    Minutes of Exercise per Session: 0 min  Stress: No Stress Concern Present (07/15/2023)   [employer] of Occupational Health - Occupational Stress Questionnaire    Feeling of Stress : Only a little  Social Connections: Moderately Isolated (07/15/2023)   Social Connection and Isolation Panel    Frequency of Communication with Friends and Family: Twice a week    Frequency of Social Gatherings with Friends  and Family: Once a week    Attends Religious Services: More than 4 times per year    Active Member of [employer] or Organizations: No    Attends Banker Meetings: Never    Marital Status: Separated  Intimate Partner Violence: Not At Risk (07/15/2023)   Humiliation, Afraid, Rape, and Kick  questionnaire    Fear of Current or Ex-Partner: No    Emotionally Abused: No    Physically Abused: No    Sexually Abused: No    Review of Systems: ROS negative except for what is noted on the assessment and plan.  Vitals:   06/08/24 0905  BP: 100/62  Pulse: 83  Temp: 98.2 F (36.8 C)  TempSrc: Oral  SpO2: 99%  Weight: 205 lb 3.2 oz (93.1 kg)  Height: 5' 4 (1.626 m)    Physical Exam: Constitutional: well-appearing female  in no acute distress HENT: normocephalic atraumatic, mucous membranes moist Ears: Does not have her hearing aids in, unable to hear Eyes: conjunctiva non-erythematous Neck: supple Cardiovascular: regular rate and rhythm, no m/r/g Pulmonary/Chest: normal work of breathing on room air, lungs clear to auscultation bilaterally, some decreased breath sounds in left middle lobe Abdominal: soft, non-tender, non-distended MSK: normal bulk and tone Assessment & Plan:   Diabetes mellitus type 2, noninsulin dependent (HCC) Unfortunately unable to discuss her diabetes with her given time constraints and hearing constraints as she did not have her hearing aids. Have scheduled her to follow up in one week to discuss this.   Productive cough Pt presents for an acute visit regarding a productive cough. She states that she went to bed on Sunday feeling fine, then woke up with a cough and headaches. Her cough has worsened in frequency and intensity unfortunately throughout the week, and she describes a thick mucus production. She does endorse subjective fevers and chills throughout the week as well since symptom onset. Furthermore, she is stating that she is having shortness of  breath at rest as well as with ambulation. When she does cough she has some centralized chest pain, that is reproducible. Her son was recently sick last week with similar symptoms.   On my exam, she is moving air well but may have some decreased breath sounds in her right middle lobe.   Her symptoms seem consistent with a LRTI, potentially viral in nature. Will get chest x-ray, and CBC to evaluate for leukocytosis to determine antibiotic initiation. In the meanwhile discussed with patient to use things like tylenol , robitussin, and hydration to help improve symptoms.   Chest X-ray does show an infiltrate, so will treat as CAP with Augmentin BID for 5 days, and Azithromycin 500mg  for three days.   Plan:  - Augmentin 875-125mg  BID for 5 days  - Azithromycin 500mg  for three days   Patient discussed with Dr. Karna Dirks Genevia Bouldin, M.D. [facility] Internal Medicine, PGY-3 Pager: [phone] Date 06/08/2024 Time 3:43 PM

## 2024-06-09 LAB — CBC WITH DIFFERENTIAL/PLATELET
Basophils Absolute: 0 x10E3/uL (ref 0.0–0.2)
Basos: 0 %
EOS (ABSOLUTE): 0.1 x10E3/uL (ref 0.0–0.4)
Eos: 2 %
Hematocrit: 37 % (ref 34.0–46.6)
Hemoglobin: 12 g/dL (ref 11.1–15.9)
Immature Grans (Abs): 0 x10E3/uL (ref 0.0–0.1)
Immature Granulocytes: 0 %
Lymphocytes Absolute: 1.3 x10E3/uL (ref 0.7–3.1)
Lymphs: 35 %
MCH: 28.8 pg (ref 26.6–33.0)
MCHC: 32.4 g/dL (ref 31.5–35.7)
MCV: 89 fL (ref 79–97)
Monocytes Absolute: 0.6 x10E3/uL (ref 0.1–0.9)
Monocytes: 17 %
Neutrophils Absolute: 1.6 x10E3/uL (ref 1.4–7.0)
Neutrophils: 46 %
Platelets: 209 x10E3/uL (ref 150–450)
RBC: 4.16 x10E6/uL (ref 3.77–5.28)
RDW: 13.3 % (ref 11.7–15.4)
WBC: 3.6 x10E3/uL (ref 3.4–10.8)

## 2024-06-09 LAB — MICROALBUMIN / CREATININE URINE RATIO
Creatinine, Urine: 208.8 mg/dL
Microalb/Creat Ratio: 191 mg/g{creat} — ABNORMAL HIGH (ref 0–29)
Microalbumin, Urine: 399.6 ug/mL

## 2024-06-11 NOTE — Progress Notes (Signed)
 Internal Medicine Clinic Attending  Case discussed with the resident at the time of the visit.  We reviewed the resident's history and exam and pertinent patient test results.  I agree with the assessment, diagnosis, and plan of care documented in the resident's note.

## 2024-06-15 ENCOUNTER — Ambulatory Visit

## 2024-06-15 VITALS — BP 115/74 | HR 66 | Temp 98.3°F | Ht 64.0 in | Wt 202.2 lb

## 2024-06-15 DIAGNOSIS — Z7985 Long-term (current) use of injectable non-insulin antidiabetic drugs: Secondary | ICD-10-CM | POA: Diagnosis not present

## 2024-06-15 DIAGNOSIS — E119 Type 2 diabetes mellitus without complications: Secondary | ICD-10-CM

## 2024-06-15 DIAGNOSIS — J189 Pneumonia, unspecified organism: Secondary | ICD-10-CM

## 2024-06-15 MED ORDER — OZEMPIC (0.25 OR 0.5 MG/DOSE) 2 MG/1.5ML ~~LOC~~ SOPN
PEN_INJECTOR | SUBCUTANEOUS | 0 refills | Status: DC
Start: 2024-06-15 — End: 2024-07-20

## 2024-06-15 NOTE — Progress Notes (Signed)
 CC: Follow-up  HPI:  [name] is a 53 y.o. female living with a history stated below and presents today for follow-up visit regarding DM.  Her last visit was on 06/08/24 due to a complaint of productive cough.  She was diagnosed with community-acquired pneumonia and was put on antibiotic course including Augmentin  and azithromycin .  As of today patient doesn't have any fevers, chills, shortness of breath.  Her cough has gone down but she does have some mucus production.  She mentions her congestion has gone down a lot .  She does have some discomfort in chest--but feels much better. Her diarrhea is ongoing but that has also significantly improved. She feels much better after taking her abx course of Augmentin  875-125mg  BID for 5 days and azithromycin  500mg  for three days.  Patient did mention that she has not been taking her Trulicity  and has only been taking her metformin .  She does not monitor her blood sugar levels.  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 US  Neck 11/06/14: Bilateral nodules. Dominant right lower pole nodule measures 2.3 cm. Findings meet consensus criteria for biopsy. Ultrasound-guided fine needle aspiration  should be considered Aspiration pathology: benign follicular nodule   Transfusion of blood product refused for religious reason 12/28/2022    Current Outpatient Medications on File Prior to Visit  Medication Sig Dispense Refill   acetaminophen  (TYLENOL ) 500 MG tablet Take 1,000 mg by mouth every 6 (six) hours as needed for moderate pain.     amLODipine  (NORVASC ) 10 MG tablet TAKE 1 TABLET BY MOUTH DAILY 28 tablet 3   amoxicillin -clavulanate (AUGMENTIN ) 875-125 MG tablet Take 1 tablet by mouth 2 (two) times daily. 10 tablet 0   atorvastatin  (LIPITOR) 40 MG tablet TAKE 1 TABLET BY MOUTH DAILY 28 tablet 3   benztropine  (COGENTIN ) 1 MG tablet Take 1 mg by mouth daily.     bisacodyl  (DULCOLAX) 5 MG EC tablet Take 5 mg by mouth daily as needed.     carvedilol  (COREG ) 6.25 MG tablet TAKE 1 TABLET BY MOUTH TWICE A DAY WITH MEALS 56 tablet 3   diphenhydrAMINE  (BENADRYL ) 50 MG tablet Take 50 mg of Benadryl  1 hour prior to your CT scan 1 tablet 0   DULoxetine  (CYMBALTA ) 30 MG capsule Take 1 capsule (30 mg total) by mouth daily. 30 capsule 3   ferrous sulfate  325 (65 FE) MG EC tablet Take 1 tablet (325 mg total) by mouth every other day. 15 tablet 2   fluticasone  (FLONASE ) 50 MCG/ACT nasal spray Place 2  sprays into both nostrils daily as needed for allergies or rhinitis.     Insulin  Pen Needle (BD PEN NEEDLE NANO U/F) 32G X 4 MM MISC USE TO INJECT VICTOZA  ONCE DAILY 100 each 1   metFORMIN  (GLUCOPHAGE ) 1000 MG tablet TAKE 1 TABLET BY MOUTH TWICE A DAY 56 tablet 3   miconazole  (MICATIN) 2 % cream Apply 1 Application topically 2 (two) times daily. 28.35 g 0   Multiple Vitamins-Minerals (HAIR SKIN & NAILS) TABS Take 1 tablet by mouth daily.     omeprazole  (PRILOSEC) 40 MG capsule Take 1 capsule (40 mg total) by mouth daily. 30 capsule 1   ondansetron  (ZOFRAN -ODT) 4 MG disintegrating tablet Take 1 tablet (4 mg total) by mouth every 8 (eight) hours as needed for nausea or vomiting. 10 tablet 0   Paliperidone ER  (INVEGA SUSTENNA) injection Inject 117 mg into the muscle every 30 (thirty) days. (Patient not taking: Reported on 02/07/2024)     TRULICITY  3 MG/0.5ML SOAJ INJECT 3 MG AS DIRECTED ONCE A WEEK 4 mL 3   No current facility-administered medications on file prior to visit.    Family History  Problem Relation Age of Onset   Diabetes Mother    Breast cancer Mother 37   Heart failure Mother    Hypertension Mother    Diabetes Mellitus II Mother    Colon cancer Father 18   Diabetes Mellitus II Father    Hypertension Father    Breast cancer Maternal Aunt 27   Lung cancer Maternal Grandmother    Esophageal cancer Neg Hx    Pancreatic cancer Neg Hx    Ulcerative colitis Neg Hx    Colon polyps Neg Hx    Rectal cancer Neg Hx    Stomach cancer Neg Hx     Social History   Socioeconomic History   Marital status: Married    Spouse name: Not on file   Number of children: 1   Years of education: Not on file   Highest education level: Not on file  Occupational History   Not on file  Tobacco Use   Smoking status: Never   Smokeless tobacco: Never  Vaping Use   Vaping status: Never Used  Substance and Sexual Activity   Alcohol use: No   Drug use: No   Sexual activity: Not Currently    Birth control/protection: Surgical  Other Topics Concern   Not on file  Social History Narrative   Not on file   Social Drivers of Health   Financial Resource Strain: Medium Risk (07/15/2023)   Overall Financial Resource Strain (CARDIA)    Difficulty of Paying Living Expenses: Somewhat hard  Food Insecurity: Low Risk  (08/10/2023)   Received from [facility]   Hunger Vital Sign    Within the past 12 months, you worried that your food would run out before you got money to buy more: Never true    Within the past 12 months, the food you bought just didn't last and you didn't have money to get more. : Never true  Recent Concern: Food Insecurity - Food Insecurity Present (07/15/2023)   Hunger Vital Sign     Worried About Running Out of Food in the Last Year: Sometimes true    Ran Out of Food in the Last Year: Sometimes true  Transportation Needs: No Transportation Needs (08/10/2023)   Received from [facility]    In the past 12 months, has lack of reliable transportation kept you  from medical appointments, meetings, work or from getting things needed for daily living? : No  Physical Activity: Inactive (07/15/2023)   Exercise Vital Sign    Days of Exercise per Week: 0 days    Minutes of Exercise per Session: 0 min  Stress: No Stress Concern Present (07/15/2023)   [employer] of Occupational Health - Occupational Stress Questionnaire    Feeling of Stress : Only a little  Social Connections: Moderately Isolated (07/15/2023)   Social Connection and Isolation Panel    Frequency of Communication with Friends and Family: Twice a week    Frequency of Social Gatherings with Friends and Family: Once a week    Attends Religious Services: More than 4 times per year    Active Member of [employer] or Organizations: No    Attends Banker Meetings: Never    Marital Status: Separated  Intimate Partner Violence: Not At Risk (07/15/2023)   Humiliation, Afraid, Rape, and Kick questionnaire    Fear of Current or Ex-Partner: No    Emotionally Abused: No    Physically Abused: No    Sexually Abused: No    Review of Systems: ROS  All pertinent ROS in HPI  There were no vitals filed for this visit.  Physical Exam: Physical Exam HENT:     Head: Normocephalic.  Cardiovascular:     Rate and Rhythm: Normal rate and regular rhythm.     Pulses:          Radial pulses are 2+ on the right side and 2+ on the left side.     Heart sounds: Normal heart sounds.  Pulmonary:     Effort: Pulmonary effort is normal.     Breath sounds: Normal breath sounds.  Neurological:     Mental Status: She is alert.      Assessment & Plan:     Patient seen with Dr. CHARLENA Eastern  Assessment & Plan Diabetes mellitus type 2, noninsulin dependent [facility]) Patient had increased A1c levels of 8.1 from 6.7(12/24).  Likely due to patient not on her Trulicity .  We asked patient to start back on her GLP-1 agonist.  As she is a Medicare patient Trulicity  was not covered by her insurance.  So we started her on the lowest dose of Ozempic  and will increase the dose every 4 weeks if she tolerates it well.  Patient's microalbumin to creatinine ratio was elevated at 191.  She would likely benefit from the addition of SGLT2 inhibitor.  Patient would benefit from our nutrition and diabetes services in clinic as well as seeing our pharmacist for discussion of potential addition of SGLT2 inhibitor in the future. --Start Ozempic  0.25 mg weekly -- Referral to diabetis and nutrition with Ms. Arland Plyler placed --Referral to pharmacist Ms. Fox placed to discuss current diabetes medications and for addition of SGLT2 inhibitor. -- Follow-up in 4 weeks Community acquired pneumonia of left lung, unspecified part of lung Completed antibiotic course of Augmentin  875-125mg  BID for 5 days and azithromycin  500mg  for three days.  She said she feels better overall. --Follow-up chest x-ray in 3 to 4 weeks from 06/08/2024 to monitor for resolution.   No orders of the defined types were placed in this encounter.    Rebecka Pion, D.O. [facility] Internal Medicine, PGY-1 Date 06/15/2024 Time 8:18 AM

## 2024-06-15 NOTE — Patient Instructions (Addendum)
 You are seen by us  today for diabetes follow-up.  Please follow the instructions as discussed in today's plan: --Start taking semaglutide  weekly --You will receive a call from [facility] that will help you in teaching how to inject your semaglutide  weekly --You will be receiving a call from Ms. Fox who will talk to you about how to manage the dose of semaglutide  and for potential addition of another medication for diabetes --Please come back in 4 weeks

## 2024-06-18 NOTE — Assessment & Plan Note (Signed)
 Patient had increased A1c levels of 8.1 from 6.7(12/24).  Likely due to patient not on her Trulicity .  We asked patient to start back on her GLP-1 agonist.  As she is a Medicare patient Trulicity  was not covered by her insurance.  So we started her on the lowest dose of Ozempic  and will increase the dose every 4 weeks if she tolerates it well.  Patient's microalbumin to creatinine ratio was elevated at 191.  She would likely benefit from the addition of SGLT2 inhibitor.  Patient would benefit from our nutrition and diabetes services in clinic as well as seeing our pharmacist for discussion of potential addition of SGLT2 inhibitor in the future. --Start Ozempic  0.25 mg weekly -- Referral to diabetis and nutrition with Ms. Arland Plyler placed --Referral to pharmacist Ms. Fox placed to discuss current diabetes medications and for addition of SGLT2 inhibitor. -- Follow-up in 4 weeks

## 2024-06-20 ENCOUNTER — Other Ambulatory Visit: Payer: Self-pay | Admitting: Student

## 2024-06-20 NOTE — Telephone Encounter (Signed)
 Medication sent to pharmacy

## 2024-06-28 ENCOUNTER — Telehealth: Payer: Self-pay | Admitting: *Deleted

## 2024-06-28 NOTE — Telephone Encounter (Signed)
 Copied from CRM #[id number]. Topic: General - Other >> Jun 28, 2024  2:18 PM Miquel SAILOR wrote: Reason for CRM: Patient returning. No notes. Called office tried to transfer but disconnected. Will send CRM for call back [phone]

## 2024-06-28 NOTE — Telephone Encounter (Signed)
 Did someone called this pt ?

## 2024-07-02 ENCOUNTER — Other Ambulatory Visit: Payer: Self-pay

## 2024-07-02 DIAGNOSIS — C183 Malignant neoplasm of hepatic flexure: Secondary | ICD-10-CM

## 2024-07-03 ENCOUNTER — Inpatient Hospital Stay (HOSPITAL_BASED_OUTPATIENT_CLINIC_OR_DEPARTMENT_OTHER): Payer: Medicare Other | Admitting: Hematology

## 2024-07-03 ENCOUNTER — Other Ambulatory Visit: Payer: Self-pay

## 2024-07-03 ENCOUNTER — Inpatient Hospital Stay: Payer: Medicare Other | Attending: Hematology

## 2024-07-03 ENCOUNTER — Encounter: Payer: Self-pay | Admitting: Hematology

## 2024-07-03 VITALS — BP 140/87 | HR 93 | Temp 97.6°F | Resp 18 | Ht 64.0 in | Wt 208.2 lb

## 2024-07-03 DIAGNOSIS — L720 Epidermal cyst: Secondary | ICD-10-CM | POA: Insufficient documentation

## 2024-07-03 DIAGNOSIS — Z08 Encounter for follow-up examination after completed treatment for malignant neoplasm: Secondary | ICD-10-CM | POA: Diagnosis present

## 2024-07-03 DIAGNOSIS — D509 Iron deficiency anemia, unspecified: Secondary | ICD-10-CM | POA: Insufficient documentation

## 2024-07-03 DIAGNOSIS — Z91041 Radiographic dye allergy status: Secondary | ICD-10-CM

## 2024-07-03 DIAGNOSIS — K5900 Constipation, unspecified: Secondary | ICD-10-CM | POA: Insufficient documentation

## 2024-07-03 DIAGNOSIS — C183 Malignant neoplasm of hepatic flexure: Secondary | ICD-10-CM

## 2024-07-03 DIAGNOSIS — K649 Unspecified hemorrhoids: Secondary | ICD-10-CM | POA: Insufficient documentation

## 2024-07-03 DIAGNOSIS — Z85038 Personal history of other malignant neoplasm of large intestine: Secondary | ICD-10-CM | POA: Insufficient documentation

## 2024-07-03 LAB — CBC WITH DIFFERENTIAL (CANCER CENTER ONLY)
Abs Immature Granulocytes: 0.02 K/uL (ref 0.00–0.07)
Basophils Absolute: 0 K/uL (ref 0.0–0.1)
Basophils Relative: 0 %
Eosinophils Absolute: 0.1 K/uL (ref 0.0–0.5)
Eosinophils Relative: 2 %
HCT: 35.6 % — ABNORMAL LOW (ref 36.0–46.0)
Hemoglobin: 11.8 g/dL — ABNORMAL LOW (ref 12.0–15.0)
Immature Granulocytes: 0 %
Lymphocytes Relative: 33 %
Lymphs Abs: 1.6 K/uL (ref 0.7–4.0)
MCH: 28.4 pg (ref 26.0–34.0)
MCHC: 33.1 g/dL (ref 30.0–36.0)
MCV: 85.6 fL (ref 80.0–100.0)
Monocytes Absolute: 0.6 K/uL (ref 0.1–1.0)
Monocytes Relative: 12 %
Neutro Abs: 2.6 K/uL (ref 1.7–7.7)
Neutrophils Relative %: 53 %
Platelet Count: 207 K/uL (ref 150–400)
RBC: 4.16 MIL/uL (ref 3.87–5.11)
RDW: 14 % (ref 11.5–15.5)
WBC Count: 5 K/uL (ref 4.0–10.5)
nRBC: 0 % (ref 0.0–0.2)

## 2024-07-03 LAB — IRON AND IRON BINDING CAPACITY (CC-WL,HP ONLY)
Iron: 66 ug/dL (ref 28–170)
Saturation Ratios: 25 % (ref 10.4–31.8)
TIBC: 269 ug/dL (ref 250–450)
UIBC: 203 ug/dL (ref 148–442)

## 2024-07-03 LAB — CMP (CANCER CENTER ONLY)
ALT: 19 U/L (ref 0–44)
AST: 16 U/L (ref 15–41)
Albumin: 4.1 g/dL (ref 3.5–5.0)
Alkaline Phosphatase: 84 U/L (ref 38–126)
Anion gap: 4 — ABNORMAL LOW (ref 5–15)
BUN: 7 mg/dL (ref 6–20)
CO2: 31 mmol/L (ref 22–32)
Calcium: 9.4 mg/dL (ref 8.9–10.3)
Chloride: 107 mmol/L (ref 98–111)
Creatinine: 0.65 mg/dL (ref 0.44–1.00)
GFR, Estimated: >60 mL/min (ref >60)
Glucose, Bld: 127 mg/dL — ABNORMAL HIGH (ref 70–99)
Potassium: 4.1 mmol/L (ref 3.5–5.1)
Sodium: 142 mmol/L (ref 135–145)
Total Bilirubin: 0.2 mg/dL (ref 0.0–1.2)
Total Protein: 6.7 g/dL (ref 6.5–8.1)

## 2024-07-03 LAB — FERRITIN: Ferritin: 102 ng/mL (ref 11–307)

## 2024-07-03 MED ORDER — DIPHENHYDRAMINE HCL 50 MG PO TABS: ORAL_TABLET | ORAL | 0 refills | Status: AC

## 2024-07-03 MED ORDER — PREDNISONE 50 MG PO TABS: ORAL_TABLET | ORAL | 1 refills | Status: DC

## 2024-07-03 NOTE — Progress Notes (Signed)
 Verbal order w/readback from Dr Lanny to prescribe Prednisone  50mg  x3 pills and Diphenydramine 50mg  x1pill for pt to use prior to receiving IV contrast for CT Scan.  Prescription sent and pt is aware.

## 2024-07-03 NOTE — Progress Notes (Signed)
 [name]   [phone] [phone]   Clinic Follow up Note   Patient Care Team: Sandy Sharper, MD as PCP - General Sandy, [place] ORN, MD as Referring Physician (Optometry) Sandy Callander, MD as Consulting Physician (Oncology) Sandy Elspeth, MD as Consulting Physician (General Surgery) Sandy, [place] SQUIBB, MD as Consulting Physician (Gastroenterology) Sandy Comer SAUNDERS, MD as Consulting Physician (Gynecologic Oncology)  Date of Service:  07/03/2024  CHIEF COMPLAINT: f/u of stage II colon cancer  CURRENT THERAPY:  Cancer surveillance  Oncology History   Primary cancer of hepatic flexure of colon (HCC) pT3N0M0, stage II, MSS --Patient presents with iron  deficient anemia since 06/2022, and received IV iron  and EPO in [place], she was referred to GI at that time. -She finally had colonoscopy and the EGD on December 14, 2022, which showed a large mass in proximal transverse colon, and a biopsy confirmed adenocarcinoma.  I discussed the findings with her in detail. -Her staging CT scan will showed a large ovarian cyst, and bilateral adrenal gland nodules, no other definitive evidence of node or distant metastasis.  CT scan of the adrenal cortical showed adrenal adenoma. -She underwent right hemicolectomy and lymph node biopsy in early May 2024, I reviewed her surgical pathology findings with patient.  She had a T3 lesion, 428 lymph nodes were negative, surgical margins were negative, no high risk features.  -Continue cancer surveillance.  Surveillance CT scan in March 2025 was negative.  Assessment & Plan Stage II colon cancer, status post resection, under surveillance Stage II colon cancer, status post resection, currently under surveillance. Last colonoscopy in April 2025 was normal. Blood counts are well-managed. - Schedule CT scan in March 2027 - Continue routine surveillance with colonoscopy in 2028  Constipation Intermittent constipation  with associated abdominal pain. Symptoms improve with dietary changes and stool softeners. - Advise increased intake of vegetables, fiber, and fruits - Recommend use of stool softeners such as Colace - Encourage increased water  intake  Hemorrhoids Mild hemorrhoids causing occasional bleeding. Symptoms exacerbated by constipation. No need for surgical intervention at this time. - Manage constipation to reduce hemorrhoid symptoms  Left arm epidermal cyst Small cyst in the left arm, present for one month. Non-cancerous and not causing significant symptoms.  Plan - She is clinically doing well, exam was unremarkable, lab reviewed.  No clinical suspicion for recurrence - Follow-up in 6 months with lab and CT scan 1 week before.  Due to her CT contrast allergy, will give her premeds.     SUMMARY OF ONCOLOGIC HISTORY: Oncology History  Primary cancer of hepatic flexure of colon (HCC)  12/21/2022 Imaging    IMPRESSION: 1. Colon is minimally distended and no discrete colonic lesion is identified to correspond with patient's given history of colonic neoplasm. 2. Bilateral adrenal nodules measuring 12 mm in the right adrenal gland and 1 cm in the left adrenal gland are new from CT August 20 2015. These likely reflect benign adrenal adenomas however they are technically indeterminate and small metastatic lesions are not excluded, consider more definitive characterization with pre and postcontrast adrenal protocol CT or MRI versus attention on follow-up oncologic imaging. 3. Tiny bilateral pulmonary nodules along the major fissures measuring up to 2 mm are nonspecific but favored to reflect intrapulmonary lymph nodes. However, given lack of prior imaging for comparison these warrant attention on short-term interval follow-up dedicated chest CT. 4. 7.7 cm left ovarian cyst, recommend further evaluation by dedicated pelvic ultrasound. 5. Nonobstructive bilateral renal stones measure  up to 4  mm in the right kidney.   12/26/2022 Initial Diagnosis   Cancer of transverse colon (HCC)   03/24/2023 Cancer Staging   Staging form: Colon and Rectum, AJCC 8th Edition - Pathologic stage from 03/24/2023: Stage IIA (pT3, pN0, cM0) - Signed by Sandy Callander, MD on 04/05/2023 Total positive nodes: 0 Histologic grading system: 4 grade system Histologic grade (G): G2 Residual tumor (R): R0 - None      Discussed the use of AI scribe software for clinical note transcription with the patient, who gave verbal consent to proceed.  History of Present Illness [name] is a 53 year old female with stage two colon cancer who presents for follow-up.  She experiences occasional mid-abdominal pain, especially after prolonged sitting, persisting for about four weeks. Bowel movements vary between loose, watery, and normal, with occasional blood in the stool, possibly due to constipation. She also reports occasional constipation and straining during bowel movements.  A painful lump in her left arm has been present for about a month, causing discomfort. She is uncertain about its nature.  She lives alone and is able to drive, although she did not drive to this appointment. No new symptoms have emerged in the past six months, aside from the abdominal pain and changes in bowel habits.     All other systems were reviewed with the patient and are negative.  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 US  Neck 11/06/14: Bilateral nodules. Dominant right lower pole nodule measures 2.3 cm. Findings meet consensus criteria for biopsy. Ultrasound-guided fine needle aspiration should be considered Aspiration pathology: benign follicular nodule   Transfusion of blood product refused for religious reason 12/28/2022    SURGICAL HISTORY: Past Surgical History:  Procedure Laterality Date   CESAREAN SECTION     EXPLORATORY LAPAROTOMY WITH ABDOMINAL MASS EXCISION     MYOMECTOMY N/A 08/05/2015   Procedure: Abdominal Hysterectomy, Bilateral Salpingectomy;  Surgeon: Gloris DELENA Hugger, MD;  Location: [facility];  Service: Gynecology;  Laterality: N/A;   ROBOTIC ASSISTED SALPINGO OOPHERECTOMY Bilateral 03/24/2023   Procedure: XI ROBOTIC ASSISTED  BILATERAL  OOPHORECTOMY, LYSIS OF ADHESIONS;  Surgeon: Sandy Comer SAUNDERS, MD;  Location: [facility];  Service: Gynecology;  Laterality: Bilateral;    I have reviewed the social history and family history with the patient and they are unchanged from previous note.  ALLERGIES:  is allergic to bactrim  [sulfamethoxazole -trimethoprim ], sulfa  antibiotics, sulfasalazine, ivp dye [iodinated contrast media], lisinopril , losartan , metrizamide, bee pollen, other, peanut-containing drug products, and pollen extract.  MEDICATIONS:  Current Outpatient Medications  Medication Sig Dispense Refill   acetaminophen  (TYLENOL )  500 MG tablet Take 1,000 mg by mouth every 6 (six) hours as needed for moderate pain.     amLODipine  (NORVASC ) 10 MG tablet TAKE 1 TABLET BY MOUTH DAILY 28 tablet 3   amoxicillin -clavulanate (AUGMENTIN ) 875-125 MG tablet Take 1 tablet by mouth 2 (two) times daily. 10 tablet 0   atorvastatin  (LIPITOR) 40 MG tablet TAKE 1 TABLET BY MOUTH DAILY 28 tablet 3   benztropine   (COGENTIN ) 1 MG tablet Take 1 mg by mouth daily.     bisacodyl  (DULCOLAX) 5 MG EC tablet Take 5 mg by mouth daily as needed.     carvedilol  (COREG ) 6.25 MG tablet TAKE 1 TABLET BY MOUTH TWICE A DAY WITH MEALS 56 tablet 3   diphenhydrAMINE  (BENADRYL ) 50 MG tablet Take 50 mg of Benadryl  1 hour prior to your CT scan 1 tablet 0   DULoxetine  (CYMBALTA ) 30 MG capsule Take 1 capsule (30 mg total) by mouth daily. 30 capsule 3   ferrous sulfate  325 (65 FE) MG EC tablet Take 1 tablet (325 mg total) by mouth every other day. 15 tablet 2   fluticasone  (FLONASE ) 50 MCG/ACT nasal spray Place 2 sprays into both nostrils daily as needed for allergies or rhinitis.     Insulin  Pen Needle (BD PEN NEEDLE NANO U/F) 32G X 4 MM MISC USE TO INJECT VICTOZA  ONCE DAILY 100 each 1   metFORMIN  (GLUCOPHAGE ) 1000 MG tablet TAKE 1 TABLET BY MOUTH TWICE A DAY 56 tablet 3   miconazole  (MICATIN) 2 % cream Apply 1 Application topically 2 (two) times daily. 28.35 g 0   Multiple Vitamins-Minerals (HAIR SKIN & NAILS) TABS Take 1 tablet by mouth daily.     omeprazole  (PRILOSEC) 40 MG capsule Take 1 capsule (40 mg total) by mouth daily. 30 capsule 1   ondansetron  (ZOFRAN -ODT) 4 MG disintegrating tablet Take 1 tablet (4 mg total) by mouth every 8 (eight) hours as needed for nausea or vomiting. 10 tablet 0   Paliperidone ER (INVEGA SUSTENNA) injection Inject 117 mg into the muscle every 30 (thirty) days. (Patient not taking: Reported on 02/07/2024)     predniSONE  (DELTASONE ) 50 MG tablet Take 1 tab (50mg ) by mouth 13hrs prior to CT Scan. Then take 1 tab (50mg ) by mouth 7hrs prior to CT Scan. Then take 1 tab (50mg ) by mouth 1hr prior to CT Scan. 3 tablet 1   Semaglutide ,0.25 or 0.5MG /DOS, (OZEMPIC , 0.25 OR 0.5 MG/DOSE,) 2 MG/1.5ML SOPN Inject 0.25 mg into the skin once a week for 28 days, THEN 0.5 mg once a week for 28 days. 3 mL 0   No current facility-administered medications for this visit.    PHYSICAL EXAMINATION: ECOG PERFORMANCE  STATUS: 0 - Asymptomatic  Vitals:   07/03/24 0938  BP: (!) 140/87  Pulse: 93  Resp: 18  Temp: 97.6 F (36.4 C)  SpO2: 100%   Wt Readings from Last 3 Encounters:  07/03/24 208 lb 4 oz (94.5 kg)  06/15/24 202 lb 3.2 oz (91.7 kg)  06/08/24 205 lb 3.2 oz (93.1 kg)     GENERAL:alert, no distress and comfortable SKIN: skin color, texture, turgor are normal, no rashes or significant lesions EYES: normal, Conjunctiva are pink and non-injected, sclera clear NECK: supple, thyroid  normal size, non-tender, without nodularity LYMPH:  no palpable lymphadenopathy in the cervical, axillary  LUNGS: clear to auscultation and percussion with normal breathing effort HEART: regular rate & rhythm and no murmurs and no lower extremity edema ABDOMEN:abdomen soft, non-tender and normal bowel sounds  Musculoskeletal:no cyanosis of digits and no clubbing  NEURO: alert & oriented x 3 with fluent speech, no focal motor/sensory deficits  Physical Exam ABDOMEN: Liver normal EXTREMITIES: Small cyst in left arm  LABORATORY DATA:  I have reviewed the data as listed    Latest Ref Rng & Units 07/03/2024    9:27 AM 06/08/2024   10:14 AM 01/12/2024    8:52 AM  CBC  WBC 4.0 - 10.5 K/uL 5.0  3.6  5.4   Hemoglobin 12.0 - 15.0 g/dL 88.1  87.9  88.1   Hematocrit 36.0 - 46.0 % 35.6  37.0  35.5   Platelets 150 - 400 K/uL 207  209  232         Latest Ref Rng & Units 07/03/2024    9:27 AM 01/12/2024    8:52 AM 11/04/2023    9:45 AM  CMP  Glucose 70 - 99 mg/dL 872  870  887   BUN 6 - 20 mg/dL 7  8  12    Creatinine 0.44 - 1.00 mg/dL 9.34  9.28  8.88   Sodium 135 - 145 mmol/L 142  142  140   Potassium 3.5 - 5.1 mmol/L 4.1  3.9  3.6   Chloride 98 - 111 mmol/L 107  102  97   CO2 22 - 32 mmol/L 31  33  25   Calcium  8.9 - 10.3 mg/dL 9.4  9.5  9.6   Total Protein 6.5 - 8.1 g/dL 6.7  7.2    Total Bilirubin 0.0 - 1.2 mg/dL 0.2  0.4    Alkaline Phos 38 - 126 U/L 84  88    AST 15 - 41 U/L 16  15    ALT 0 - 44 U/L 19   23        RADIOGRAPHIC STUDIES: I have personally reviewed the radiological images as listed and agreed with the findings in the report. No results found.    Orders Placed This Encounter  Procedures   CT CHEST ABDOMEN PELVIS W CONTRAST    Standing Status:   Future    Expected Date:   12/26/2024    Expiration Date:   07/03/2025    If indicated for the ordered procedure, I authorize the administration of contrast media per Radiology protocol:   Yes    Does the patient have a contrast media/X-ray dye allergy?:   Yes    Preferred imaging location?:   [facility]    If indicated for the ordered procedure, I authorize the administration of oral contrast media per Radiology protocol:   Yes   All questions were answered. The patient knows to call the clinic with any problems, questions or concerns. No barriers to learning was detected. The total time spent in the appointment was 25 minutes, including review of chart and various tests results, discussions about plan of care and coordination of care plan     Onita Mattock, MD 07/03/2024

## 2024-07-03 NOTE — Assessment & Plan Note (Signed)
 pT3N0M0, stage II, MSS --Patient presents with iron  deficient anemia since 06/2022, and received IV iron  and EPO in hospital, she was referred to GI at that time. -She finally had colonoscopy and the EGD on December 14, 2022, which showed a large mass in proximal transverse colon, and a biopsy confirmed adenocarcinoma.  I discussed the findings with her in detail. -Her staging CT scan will showed a large ovarian cyst, and bilateral adrenal gland nodules, no other definitive evidence of node or distant metastasis.  CT scan of the adrenal cortical showed adrenal adenoma. -She underwent right hemicolectomy and lymph node biopsy in early May 2024, I reviewed her surgical pathology findings with patient.  She had a T3 lesion, 428 lymph nodes were negative, surgical margins were negative, no high risk features.  -Continue cancer surveillance.  Surveillance CT scan in March 2025 was negative.

## 2024-07-04 ENCOUNTER — Telehealth: Payer: Self-pay | Admitting: *Deleted

## 2024-07-04 NOTE — Progress Notes (Unsigned)
 Care Guide Pharmacy Note  07/04/2024 Name: [name] MRN: [id number] DOB: 1970/11/25  Referred By: Norrine Sharper, MD Reason for referral: Complex Care Management (Initial outreach to schedule referral with PharmD [facility]  )   [name] is a 53 y.o. year old female who is a primary care patient of Norrine Sharper, MD.  [name] was referred to the pharmacist for assistance related to: DMII  An unsuccessful telephone outreach was attempted today to contact the patient who was referred to the pharmacy team for assistance with medication management. Additional attempts will be made to contact the patient.  [name]  [facility]  [facility], [facility] Guide  Direct Dial: [phone]  Fax [phone]

## 2024-07-05 NOTE — Progress Notes (Unsigned)
 Care Guide Pharmacy Note  07/05/2024 Name: [name] MRN: [id number] DOB: August 22, 1971  Referred By: Norrine Sharper, MD Reason for referral: Complex Care Management (Initial outreach to schedule referral with PharmD [facility]  )   [name] is a 53 y.o. year old female who is a primary care patient of Norrine Sharper, MD.  [name] was referred to the pharmacist for assistance related to: DMII  A second unsuccessful telephone outreach was attempted today to contact the patient who was referred to the pharmacy team for assistance with medication management. Additional attempts will be made to contact the patient.  [name]  [facility]  [facility], [facility] Guide  Direct Dial: [phone]  Fax [phone]

## 2024-07-05 NOTE — Progress Notes (Signed)
 Internal Medicine Clinic Attending  I was physically present during the key portions of the resident provided service and participated in the medical decision making of patient's management care. I reviewed pertinent patient test results.  The assessment, diagnosis, and plan were formulated together and I agree with the documentation in the resident's note.  Rosan Dayton BROCKS, DO

## 2024-07-06 NOTE — Progress Notes (Signed)
 Care Guide Pharmacy Note  07/06/2024 Name: [name] MRN: [id number] DOB: [birth year]  Referred By: Norrine Sharper, MD Reason for referral: Complex Care Management (Initial outreach to schedule referral with PharmD [facility]  )   [name] is a 53 y.o. year old female who is a primary care patient of Norrine Sharper, MD.  [name] was referred to the pharmacist for assistance related to: DMII  Successful contact was made with the patient to discuss pharmacy services including being ready for the pharmacist to call at least 5 minutes before the scheduled appointment time and to have medication bottles and any blood pressure readings ready for review. The patient agreed to meet with the pharmacist via in office face to face  on (date/time).07/23/24 at 10:30AM   [name]  [facility], [facility] Guide  Direct Dial: [phone]  Fax [phone]

## 2024-07-09 ENCOUNTER — Ambulatory Visit (INDEPENDENT_AMBULATORY_CARE_PROVIDER_SITE_OTHER): Admitting: Dietician

## 2024-07-09 VITALS — BP 112/74 | HR 100 | Wt 203.6 lb

## 2024-07-09 DIAGNOSIS — E119 Type 2 diabetes mellitus without complications: Secondary | ICD-10-CM | POA: Diagnosis not present

## 2024-07-09 NOTE — Progress Notes (Signed)
 Medical Nutrition Therapy:  Appt start time: 1115 end time:  1200. Total time: 45 minutes Visit # 1  Assessment:  Primary concerns today: weight and glycemic control. Familiar with patient and last visit was July 2022 with me Patient orders food from [employer] app with assistance of her son. She does her own meal preparation. She was having trouble finding foods on app sometimes due to not being abel to spell some foods. She mostly selected foods that were suggested by the app such as sweets or extra sweet tea. She states she cannot stop eating some foods until they are gone such as chips, ice cream and cake. Low threshold for eating disorder.  Preferred Learning Style: No preference indicated  Learning Readiness:Contemplating  ANTHROPOMETRICS:Estimated body mass index is 34.95 kg/m as calculated from the following:   Height as of 07/03/24: 5' 4 (1.626 m).   Weight as of this encounter: 203 lb 9.6 oz (92.4 kg).  WEIGHT HISTORY: Wt Readings from Last 10 Encounters:  07/09/24 203 lb 9.6 oz (92.4 kg)  07/03/24 208 lb 4 oz (94.5 kg)  06/15/24 202 lb 3.2 oz (91.7 kg)  06/08/24 205 lb 3.2 oz (93.1 kg)  02/21/24 205 lb (93 kg)  02/07/24 205 lb (93 kg)  01/12/24 205 lb 3.2 oz (93.1 kg)  11/04/23 195 lb 4.8 oz (88.6 kg)  10/28/23 201 lb (91.2 kg)  10/04/23 207 lb 14.4 oz (94.3 kg)    SLEEP: states she has trouble at times- at others sleeps too much  MEDICATIONS:  states she is taking 0.25 mg Ozempic  weekly and tolerating with desirable side effects of decreased hunger and more control of food intake. demonstrated how to use Ozempic  pen. Note she recently had high dose pre-CT scan steroid dosing. She did not report any symptoms of high glucose today., she also did not mention taking metformin  for her diabetes  BLOOD SUGAR:not checking at home Lab Results  Component Value Date   HGBA1C 8.1 (A) 06/08/2024   HGBA1C 6.7 (A) 11/04/2023   HGBA1C 6.9 (A) 07/15/2023   HGBA1C 6.0 (H) 02/04/2023    HGBA1C 6.9 (A) 10/01/2022     DIETARY INTAKE: Usual eating pattern includes 2 meals and 2 snacks per day. Everyday foods include see 24 hour recall- raisin bran dry 1-2 cups daily, potato bread, weight watchers meals, patient stated she eats bananas when asked about vegetable intake. , Diarrhea: 4 semi-formed stools per day, Constipation: no,any hair loss- no Dining Out (times/week): need to assess at future visit  24-hr recall:  B ( 11 AM): coffee with sugar and sweetened powdered creamer, egg sandwich with mayonnaise  Snk ( AM): banana, carrot cake or whole container of Pringles or whole box of ice cream treats D ( PM): weight watcher macaroni and steak dinner or asian bowl Beverages: sweet tea, sweet coffee, some water   Usual physical activity: states she started walking 30 minutes 5 days a week in her apartment complex   Progress Towards Goal(s):  In progress.   Nutritional Diagnosis:  NI-5.8.2 Excessive carbohydrate intake As related to high sugar intake.  As evidenced by her high sugar food choices and large portions of high sugar foods. .    Intervention:  Nutrition education about lower sugar food choices, plate method of diabetes meal planning. Action Goal:decrease sugar intake, A1c and weight  Outcome goal: :decrease sugar intake, A1c and weight Coordination of care: agree with Ozempic , suggest titration as needed  Teaching Method Utilized: Visual, Auditory,Hands on Handouts given during  visit include:nutrition handout using plate method of meal planning and  After visit summary  Barriers to learning/adherence to lifestyle change: low health literacy, mental illness Demonstrated degree of understanding via:  Teach Back  a;though recall was difficult  Monitoring/Evaluation:  Dietary intake, exercise, meter, and body weight in 4 week(s) Arland Hole, RD 07/09/2024 4:04 PM. .

## 2024-07-09 NOTE — Patient Instructions (Signed)
 Thank you for coming today!   Keep- walking every day for at least 30 minutes- good job! Keep taking your OZEMPIC  every week like you are- good job!!!  Changes you said you wanted to make to your food are-  1- drinking unsweet tea 2- eat more vegetables- aim for half your plate 3- try eating fruit for sweets instead of carrot cake 4- use whole wheat bread and limit to 2 slices a day  [name] [phone] (new number)

## 2024-07-11 ENCOUNTER — Ambulatory Visit: Payer: Medicare Other | Admitting: Hematology

## 2024-07-11 ENCOUNTER — Other Ambulatory Visit: Payer: Medicare Other

## 2024-07-20 ENCOUNTER — Ambulatory Visit

## 2024-07-20 ENCOUNTER — Other Ambulatory Visit: Payer: Self-pay

## 2024-07-20 VITALS — BP 110/76 | HR 83 | Temp 98.9°F | Ht 64.0 in | Wt 201.0 lb

## 2024-07-20 DIAGNOSIS — Z7985 Long-term (current) use of injectable non-insulin antidiabetic drugs: Secondary | ICD-10-CM

## 2024-07-20 DIAGNOSIS — Z23 Encounter for immunization: Secondary | ICD-10-CM | POA: Diagnosis present

## 2024-07-20 DIAGNOSIS — E119 Type 2 diabetes mellitus without complications: Secondary | ICD-10-CM | POA: Diagnosis not present

## 2024-07-20 DIAGNOSIS — J189 Pneumonia, unspecified organism: Secondary | ICD-10-CM

## 2024-07-20 MED ORDER — OZEMPIC (0.25 OR 0.5 MG/DOSE) 2 MG/1.5ML ~~LOC~~ SOPN
0.5000 mg | PEN_INJECTOR | SUBCUTANEOUS | 0 refills | Status: AC
Start: 2024-07-22 — End: 2024-08-13

## 2024-07-20 NOTE — Progress Notes (Signed)
 Established Patient Office Visit  Subjective   Patient ID: [name], female    DOB: 1970/11/22  Age: 53 y.o. MRN: [id number]  Patient here for semaglutide  follow up and titrate. She is feeling good and recently celebrated a birthday. She brought her packaged medications from the pharmacy. She is deaf on the left ear and has difficulty hearing on the right ear. She is hopeful to get hearing aids soon.         Objective:     BP 110/76 (BP Location: Left Arm, Patient Position: Sitting, Cuff Size: Normal)   Pulse 83   Temp 98.9 F (37.2 C) (Oral)   Ht 5' 4 (1.626 m)   Wt 201 lb (91.2 kg)   LMP 07/23/2015 (Exact Date)   SpO2 97%   BMI 34.50 kg/m  BP Readings from Last 3 Encounters:  07/20/24 110/76  07/03/24 (!) 140/87  06/15/24 115/74   Wt Readings from Last 3 Encounters:  07/20/24 201 lb (91.2 kg)  07/09/24 203 lb 9.6 oz (92.4 kg)  07/03/24 208 lb 4 oz (94.5 kg)      Physical Exam Vitals reviewed.  Constitutional:      Appearance: Normal appearance.  HENT:     Head: Normocephalic and atraumatic.     Nose: Nose normal.     Mouth/Throat:     Mouth: Mucous membranes are moist.     Pharynx: Oropharynx is clear.  Eyes:     Conjunctiva/sclera: Conjunctivae normal.  Cardiovascular:     Rate and Rhythm: Normal rate and regular rhythm.     Heart sounds: Normal heart sounds.  Pulmonary:     Effort: Pulmonary effort is normal.     Breath sounds: Normal breath sounds.     Comments: CTAB, clear over left lung fields  Skin:    General: Skin is warm.  Neurological:     General: No focal deficit present.     Mental Status: She is alert and oriented to person, place, and time.     Comments: Deaf in left ear, hard of hearing in right ear  Psychiatric:        Mood and Affect: Mood normal.        Behavior: Behavior normal.      No results found for any visits on 07/20/24.  Last CBC Lab Results  Component Value Date   WBC 5.0 07/03/2024   HGB 11.8 (L)  07/03/2024   HCT 35.6 (L) 07/03/2024   MCV 85.6 07/03/2024   MCH 28.4 07/03/2024   RDW 14.0 07/03/2024   PLT 207 07/03/2024   Last metabolic panel Lab Results  Component Value Date   GLUCOSE 127 (H) 07/03/2024   NA 142 07/03/2024   K 4.1 07/03/2024   CL 107 07/03/2024   CO2 31 07/03/2024   BUN 7 07/03/2024   CREATININE 0.65 07/03/2024   GFRNONAA >60 07/03/2024   CALCIUM  9.4 07/03/2024   PHOS 4.0 03/06/2015   PROT 6.7 07/03/2024   ALBUMIN  4.1 07/03/2024   LABGLOB 2.5 06/15/2022   AGRATIO 1.6 06/15/2022   BILITOT 0.2 07/03/2024   ALKPHOS 84 07/03/2024   AST 16 07/03/2024   ALT 19 07/03/2024   ANIONGAP 4 (L) 07/03/2024   Last hemoglobin A1c Lab Results  Component Value Date   HGBA1C 8.1 (A) 06/08/2024      The ASCVD Risk score (Arnett DK, et al., 2019) failed to calculate for the following reasons:   The valid total cholesterol range is 130 to 320  mg/dL    Assessment & Plan:   Assessment & Plan Encounter for immunization Given today. Patient eligible for Shingles vaccine series. Recommended her start this as soon as convenient for her.  Orders:   Flu vaccine trivalent PF, 6mos and older(Flulaval,Afluria,Fluarix,Fluzone)  Diabetes mellitus type 2, noninsulin dependent (HCC) Patient has completed four weeks of 0.25 mg semaglutide . Tolerating it well, denies stomach pain. She has been eating weight watcher pre made meals at home, she bakes her meat/chicken, has been trying to drink less sugary coffee, and is drinking smoothies with fresh fruit and greek yogurt. A1c on 7/25 was 8.1, she started ozempic  8/1. Before this she was unable to get her Trulicity  due to insurance barriers. A1c in 06/2023 was 6.9. MCR on 7/25 elevated at 191. CMP on 8/19 with 127 glucose, otherwise wnl. Last lipid panel in 06/2023 and was wnl. Plan to increase semaglutide  to 0.5 mg weekly for the next 4 weeks. Patient injects on Sundays and her first 0.5 mg injection will be this Sunday the 7th. Will  see patient in four weeks to assess tolerance of higher dose and titrate up if able. Plan to repeat A1c at the end of October, at this time recommend repeat lipid panel. She has also never had Hep C screen, consider doing at this time.  Orders:   Semaglutide ,0.25 or 0.5MG /DOS, (OZEMPIC , 0.25 OR 0.5 MG/DOSE,) 2 MG/1.5ML SOPN; Inject 0.5 mg into the skin once a week for 4 doses.  Community acquired pneumonia of left lung, unspecified part of lung On 7/25 patient had symptoms of LRI with infiltrate seen on CXR. Antibiotics were prescribed. Radiologist recommended repeat CXR in 3-4 weeks. Today, patient feels much better and denies stuffy nose or SOB or mucus producing cough. She has a lingering dry cough but overall patient feels well. I did not hear abnormal respirations on lung exam. Plan to order repeat CXR to assess for resolution of infiltrate.  Orders:   DG Chest 2 View; Future   Return in about 4 weeks (around 08/17/2024) for follow up ozempic  tolerance and titrate if possible .    Viktoria King, DO

## 2024-07-20 NOTE — Patient Instructions (Addendum)
 It was wonderful seeing you today!   Please remember to...  1) Start injecting 0.5 mg of Ozempic  once a week for the next 4 weeks. This medicine is for your diabetes  2) Continue eating a healthy diet, I'm so proud of you!  3) Someone will call you to schedule your chest x ray. This is to make sure your lung infection has resolved.   4) You're eligible for the Shingles vaccine series. You can get this at any pharmacy, whenever is convenient for you  We look forward to seeing you in 4 weeks and thank you for getting your flu shot!  If you have any questions please feel free to the call the clinic at anytime at [phone].  Have a blessed day,  Dr. Charmayne

## 2024-07-20 NOTE — Assessment & Plan Note (Addendum)
 Patient has completed four weeks of 0.25 mg semaglutide . Tolerating it well, denies stomach pain. She has been eating weight watcher pre made meals at home, she bakes her meat/chicken, has been trying to drink less sugary coffee, and is drinking smoothies with fresh fruit and greek yogurt. A1c on 7/25 was 8.1, she started ozempic  8/1. Before this she was unable to get her Trulicity  due to insurance barriers. A1c in 06/2023 was 6.9. MCR on 7/25 elevated at 191. CMP on 8/19 with 127 glucose, otherwise wnl. Last lipid panel in 06/2023 and was wnl. Plan to increase semaglutide  to 0.5 mg weekly for the next 4 weeks. Patient injects on Sundays and her first 0.5 mg injection will be this Sunday the 7th. Will see patient in four weeks to assess tolerance of higher dose and titrate up if able. Plan to repeat A1c at the end of October, at this time recommend repeat lipid panel. She has also never had Hep C screen, consider doing at this time.  Orders:   Semaglutide ,0.25 or 0.5MG /DOS, (OZEMPIC , 0.25 OR 0.5 MG/DOSE,) 2 MG/1.5ML SOPN; Inject 0.5 mg into the skin once a week for 4 doses.

## 2024-07-23 ENCOUNTER — Other Ambulatory Visit (HOSPITAL_COMMUNITY): Payer: Self-pay

## 2024-07-23 ENCOUNTER — Ambulatory Visit

## 2024-07-23 DIAGNOSIS — E1165 Type 2 diabetes mellitus with hyperglycemia: Secondary | ICD-10-CM

## 2024-07-23 DIAGNOSIS — E119 Type 2 diabetes mellitus without complications: Secondary | ICD-10-CM

## 2024-07-23 DIAGNOSIS — I10 Essential (primary) hypertension: Secondary | ICD-10-CM

## 2024-07-23 MED ORDER — METFORMIN HCL ER 500 MG PO TB24: 1000.0000 mg | ORAL_TABLET | Freq: Two times a day (BID) | ORAL | 3 refills | Status: DC

## 2024-07-23 MED ORDER — ACCU-CHEK GUIDE W/DEVICE KIT: PACK | 0 refills | Status: DC

## 2024-07-23 MED ORDER — BLOOD PRESSURE CUFF MISC: 0 refills | Status: DC

## 2024-07-23 MED ORDER — DAPAGLIFLOZIN PROPANEDIOL 10 MG PO TABS: 10.0000 mg | ORAL_TABLET | Freq: Every day | ORAL | 3 refills | Status: DC

## 2024-07-23 MED ORDER — ACCU-CHEK SOFTCLIX LANCETS MISC: 11 refills | Status: DC

## 2024-07-23 MED ORDER — ACCU-CHEK GUIDE TEST VI STRP: ORAL_STRIP | 11 refills | Status: DC

## 2024-07-23 NOTE — Progress Notes (Addendum)
 07/23/2024 Name: [name] MRN: [id number] DOB: 1971-09-20  Chief Complaint  Patient presents with   Diabetes    [name] is a 53 y.o. year old female who was referred for medication management by their primary care provider, Norrine Sharper, MD. They presented for a face to face visit today.   They were referred to the pharmacist by their PCP for assistance in managing diabetes . PMH includes HTN, OSA, T2DM, hearing loss (no hearing in L ear, difficulty hearing in R ear), BMI > 30, HLD.    Subjective: Patient was last referred by PCP, Rebecka Pion, DO, on 06/15/24. At this visit, patient reported that she had not been taking Trulicity , only metformin . She had not been monitoring her BG at home. A1C increased from 6.7% to 8.1%.  She was instructed to restart GLP-1RA and was initiated on Ozempic  0.25 mg weekly. She was referred to [facility] for education and pharmacy for medication management, and addition of SGLT2i in the future given elevated UACR. She was seen for follow up by Dr. Charmayne on 07/20/24. She reported that she had completed 4 weeks of Ozempic  0.25 mg and was tolerating well. She reported making healthier food choices. She was instructed to increase Ozempic  to 0.5 mg weekly.  Today, patient presents in  good spirits and presents without  any assistance. She presents to the visit alone. She reports that she needs to pick up Ozempic  from the pharmacy. Her last dose was due yesterday, but she plans to pick up today and start Ozempic  0.5 mg weekly as instructed. She reports that her oral medications come in pill packs from [place] pharmacy. She does not currently have a BP or BG cuff.   Care Team: Primary Care Provider: Remonia Romano, DO; Next Scheduled Visit: 08/21/24  Medication Access/Adherence  Current Pharmacy:  [facility] [address]), [place] - [address] [place] ([place]) [place] [address] Phone: [phone] Fax: [phone]  [facility] - [place], [place] - [address] [place] [address] Phone: [phone] Fax: [phone]  [facility] [place], [place] - [place] [address] [place] [address] Phone: [phone] Fax: [phone]  [facility] - [place], [place] - [address] [place] [address] Phone: [phone] Fax: [phone]   Patient reports affordability concerns with their medications: No  Patient reports access/transportation concerns to their pharmacy: Yes  - sometimes her brother or sister will pick up her medications from [place] pharmacy for her Patient reports adherence concerns with their medications:  No     Diabetes:  Current medications: metformin  IR 1000 mg BID, Ozempic  0.5 mg weekly Medications tried in the past: Canagliflozin  d/c in 2021 due to recurrent yeast infection - however A1C ranged from 9.3-12.1% at that time  Patient denies nausea/vomiting and abdominal pain since starting Ozempic . She reports occasional diarrhea.  Current glucose readings: not checking at home, does not have supplies  Patient denies hypoglycemic s/sx including dizziness, shakiness, sweating. Patient denies hyperglycemic symptoms including polyuria, polydipsia, polyphagia, nocturia, neuropathy, blurred vision.  Current meal patterns: Eating 2 meals/day - weight watchers meals. Reports she is eating in the morning and late afternoon.  - Breakfast: yogurt in the morning with fruit - Lunch: corn beef hash, sometimes grits - Drinks: prune leaf tea, occasional regular soda (not having diet or zero sugar right now)  Current physical  activity: Walking 45 minutes/day M-F  Current medication access support: [employer] Gold Plus  Macrovascular and Microvascular Risk Reduction:  Statin? yes (atorvastatin  40 mg daily); ACEi/ARB? Cough with lisinopril  and losartan  Last  urinary albumin /creatinine ratio:  Lab Results  Component Value Date   MICRALBCREAT 191 (H) 06/08/2024   MICRALBCREAT 11 07/15/2023   MICRALBCREAT 7 07/12/2022   MICRALBCREAT 16 06/11/2021   MICRALBCREAT 20 05/30/2020   MICRALBCREAT 14.2 03/21/2018   MICRALBCREAT 3.7 12/15/2016   MICRALBCREAT 21.3 09/16/2015   MICRALBCREAT 5.9 07/30/2014   Last eye exam:  Lab Results  Component Value Date   HMDIABEYEEXA No Retinopathy 05/01/2024   Last foot exam: 06/08/2024 Tobacco Use:  Tobacco Use: Low Risk  (07/03/2024)   Patient History    Smoking Tobacco Use: Never    Smokeless Tobacco Use: Never    Passive Exposure: Not on file    Hypertension:  Current medications: amlodipine  10 mg daily, carvedilol  6.25 mg BID with meals Medications previously tried: lisinopril  and losartan  (cough)  Patient does not have a validated, automated, upper arm home BP cuff  Patient reports hypotensive s/sx including dizziness, lightheadedness that is happening a couple times per week - primarily when she goes from laying down to standing Patient denies hypertensive symptoms including headache, chest pain, shortness of breath   Objective:  BP Readings from Last 3 Encounters:  07/20/24 110/76  07/03/24 (!) 140/87  06/15/24 115/74    Lab Results  Component Value Date   HGBA1C 8.1 (A) 06/08/2024   HGBA1C 6.7 (A) 11/04/2023   HGBA1C 6.9 (A) 07/15/2023       Latest Ref Rng & Units 07/03/2024    9:27 AM 01/12/2024    8:52 AM 11/04/2023    9:45 AM  BMP  Glucose 70 - 99 mg/dL 872  870  887   BUN 6 - 20 mg/dL 7  8  12    Creatinine 0.44 - 1.00 mg/dL 9.34  9.28  8.88   BUN/Creat Ratio 9 - 23   11   Sodium 135 - 145 mmol/L 142  142  140   Potassium 3.5 - 5.1 mmol/L 4.1  3.9  3.6   Chloride 98 - 111 mmol/L 107  102  97   CO2 22 - 32 mmol/L 31  33  25   Calcium  8.9 - 10.3 mg/dL 9.4  9.5  9.6     Lab Results  Component Value Date   CHOL 96 (L) 07/15/2023   HDL 39 (L) 07/15/2023   LDLCALC 44  07/15/2023   TRIG 56 07/15/2023   CHOLHDL 2.5 07/15/2023    Medications Reviewed Today     Reviewed by Brinda Lorain SQUIBB, RPH (Pharmacist) on 07/23/24 at 1307  Med List Status: <None>   Medication Order Taking? Sig Documenting Provider Last Dose Status Informant  acetaminophen  (TYLENOL ) 500 MG tablet [id number]  Take 1,000 mg by mouth every 6 (six) hours as needed for moderate pain. [provider]  Active Self  amLODipine  (NORVASC ) 10 MG tablet [id number] Yes TAKE 1 TABLET BY MOUTH DAILY McLendon, Michael, MD  Active     Discontinued 07/23/24 1306 (Completed Course)   atorvastatin  (LIPITOR) 40 MG tablet [id number] Yes TAKE 1 TABLET BY MOUTH DAILY McLendon, Michael, MD  Active   benztropine  (COGENTIN ) 1 MG tablet [id number] Yes Take 1 mg by mouth daily. [provider]  Active   bisacodyl  (DULCOLAX) 5 MG EC tablet [id number]  Take 5 mg by mouth daily as needed. [provider]  Active   carvedilol  (COREG ) 6.25 MG tablet [id number] Yes TAKE 1 TABLET BY MOUTH TWICE A DAY WITH MEALS Norrine Sharper, MD  Active   diphenhydrAMINE  (BENADRYL ) 50 MG tablet [id number]  Take 50 mg of Benadryl  1 hour prior to your CT scan Lanny Callander, MD  Active   DULoxetine  (CYMBALTA ) 30 MG capsule [id number] Yes Take 1 capsule (30 mg total) by mouth daily. Norrine Sharper, MD  Active Self  ferrous sulfate  325 (65 FE) MG EC tablet [id number]  Take 1 tablet (325 mg total) by mouth every other day. Norrine Sharper, MD  Active Self           Med Note VIRGIA, RACHEL E   Tue Feb 21, 2024  8:22 AM) Per patient, last took in 2024  fluticasone  (FLONASE ) 50 MCG/ACT nasal spray [id number]  Place 2 sprays into both nostrils daily as needed for allergies or rhinitis. [provider]  Active Self    Discontinued 07/23/24 1307 (Completed Course) metFORMIN  (GLUCOPHAGE ) 1000 MG tablet [id number] Yes TAKE 1 TABLET BY MOUTH TWICE A DAY McLendon, Michael, MD  Active     Discontinued 07/23/24 1306 (Completed  Course)   Multiple Vitamins-Minerals (HAIR SKIN & NAILS) TABS [id number]  Take 1 tablet by mouth daily. [provider]  Active Self  omeprazole  (PRILOSEC) 40 MG capsule [id number]  Take 1 capsule (40 mg total) by mouth daily. Leigh Elspeth SQUIBB, MD  Active Self  ondansetron  (ZOFRAN -ODT) 4 MG disintegrating tablet [id number]  Take 1 tablet (4 mg total) by mouth every 8 (eight) hours as needed for nausea or vomiting. Sponseller, Rebekah R, PA-C  Active   Paliperidone ER (INVEGA SUSTENNA) injection [id number] Yes Inject 117 mg into the muscle every 30 (thirty) days. [provider]  Active Self  predniSONE  (DELTASONE ) 50 MG tablet [id number]  Take 1 tab (50mg ) by mouth 13hrs prior to CT Scan. Then take 1 tab (50mg ) by mouth 7hrs prior to CT Scan. Then take 1 tab (50mg ) by mouth 1hr prior to CT Scan. Lanny Callander, MD  Active   Semaglutide ,0.25 or 0.5MG /DOS, (OZEMPIC , 0.25 OR 0.5 MG/DOSE,) 2 MG/1.5ML SOPN [id number] Yes Inject 0.5 mg into the skin once a week for 4 doses. Charmayne Holmes, DO  Active   Med List Note Giacomo Camelia BIRCH, CPhT 06/17/16 2234): Darin Bidwell(son) [phone]              Assessment/Plan:   Diabetes: - Currently uncontrolled with most recent A1C of 8.1% above goal <7% and worsened from 6.7% in the setting of being out of Trulicity . Patient has since transitioned to Ozempic , so anticipate that glycemic control will improve. Patient was referred to pharmacy for medication management and consideration of an SGLT2i given microalbuminuria. She was previously prescribed canagliflozin , but it was discontinued in 2021 due to recurrent yeast infections - however, upon chart review, appears that patient's A1C ranged from 9.3-12.7% during that time, which likely increased her risk of GU infections. Given that she has not been able to tolerate an ACEi or ARB, would like patient to retrial SGLT2i for kidney protection in the setting of T2DM. Will monitor closely for GU AE.  Patient is agreeable to addition of Farxiga . Will transition to XR metformin  given patient reported GI AE of diarrhea, which could limit titration of Ozempic  if not addressed. - Last UACR July 2025 - 191 mg/dL.  - Reviewed long term cardiovascular and renal outcomes of uncontrolled blood sugar - Reviewed goal A1c, goal fasting, and goal 2 hour post  prandial glucose - Reviewed dietary modifications including  utilizing the healthy plate method, limiting portion size of carbohydrate foods, increasing intake of protein and non-starchy vegetables. Counseled patient to stay hydrated with water  throughout the day. - Reviewed lifestyle modifications including: aiming for 150 minutes of moderate intensity exercise every week. Congratulated patient on regular walking schedule! - Recommend to increase Ozempic  to 0.5 mg weekly as previously instructed - Recommend to transition metformin  IR to metformin  XR 1000 mg BID - Recommend to START Farxiga  (dapagliflozin )10 mg daily. Patient counseled on administration and potential AE associated with SGLT2i including GU infections and dehydration. Instructed to stay hydrated with water  throughout the day. - Recommend to check glucose once daily fasting a couple times per week. Will send Rx for new glucometer. Counseled patient to bring glucometer or BG log to every appointment. - Next A1C due 09/08/24     Hypertension: - Currently controlled with clinic BP consistently below goal less than 130/80. Patient is having s/sx of hypotension, including dizziness/lightheadedness, however she also has hearing loss which could contribute to these symptoms. Patient is agreeable to start monitoring at home. If she has evidence of low blood pressures associated with dizzy spells, could consider dose reduction of carvedilol , as this is more likely to cause orthostatic hypotension compared to amlodipine . Orthostatics were negative for orthostatic hypotension when evaluated in clinic in  2023.  - Reviewed long term cardiovascular and renal outcomes of uncontrolled blood pressure - Reviewed appropriate blood pressure monitoring technique and reviewed goal blood pressure. Recommended to check home blood pressure and heart rate once daily and keep a log to bring to upcoming appointments. Will attempt to order BP cuff for home delivery from [facility] - though unsure if they will be able to process order since patient has both Medicare and Medicaid (rather than Medicaid alone) - Recommend to continue amlodipine  10 mg daily and carvedilol  6.25 mg BID    Written patient instructions provided. Patient verbalized understanding of treatment plan.   Follow Up Plan:  Pharmacist 09/17/24 (A1C) PCP clinic visit 08/21/24   Lorain Baseman, PharmD [facility] [phone]

## 2024-07-23 NOTE — Patient Instructions (Addendum)
 It was nice to see you today!  Your goal blood sugar is 80-130 before eating and less than 180 after eating. Your blood pressure goal is less than 130/80 mmHg.  I will send a prescription for a blood sugar meter to [facility].   We will get a blood pressure cuff mailed out to you.   Medication Changes: START Farxiga  10 mg daily - I will call [place] pharmacy to add this to your upcoming pill packs.  This medicine will protect your kidneys.  Monitor for any pain, burning, or discomfort with urination.  Do your best to stay hydrated with water  throughout the day Do not take this medication if you have a stomach illness with vomiting or diarrhea to avoid becoming dehydrated  Increase Ozempic  to 0.5 mg weekly when you are due for your next dose. You can pick this medication up from [facility].  Continue all other medication the same.   Monitor blood sugars at home and keep a log (glucometer or piece of paper) to bring with you to your next visit.  Keep up the good work with diet and exercise. Aim for a diet full of vegetables, fruit and lean meats (chicken, [place], fish). Try to limit salt intake by eating fresh or frozen vegetables (instead of canned), rinse canned vegetables prior to cooking and do not add any additional salt to meals.

## 2024-07-24 MED ORDER — DAPAGLIFLOZIN PROPANEDIOL 10 MG PO TABS: 10.0000 mg | ORAL_TABLET | Freq: Every day | ORAL | 3 refills | Status: AC

## 2024-07-24 MED ORDER — METFORMIN HCL ER 500 MG PO TB24: 1000.0000 mg | ORAL_TABLET | Freq: Two times a day (BID) | ORAL | 3 refills | Status: AC

## 2024-07-24 NOTE — Addendum Note (Signed)
 Addended by: BRINDA LORAIN SQUIBB on: 07/24/2024 09:17 AM   Modules accepted: Orders

## 2024-07-24 NOTE — Progress Notes (Signed)
 [facility] [place] pharmacy who confirmed that Farxiga  and metformin  XR could be added to her upcoming pill pack.   Lorain Baseman, PharmD [facility] [phone]

## 2024-07-30 NOTE — Progress Notes (Signed)
 Internal Medicine Clinic Attending  Case discussed with the resident at the time of the visit.  We reviewed the resident's history and exam and pertinent patient test results.  I agree with the assessment, diagnosis, and plan of care documented in the resident's note.

## 2024-08-01 ENCOUNTER — Telehealth: Payer: Self-pay | Admitting: Student

## 2024-08-01 DIAGNOSIS — Z1231 Encounter for screening mammogram for malignant neoplasm of breast: Secondary | ICD-10-CM

## 2024-08-01 NOTE — Telephone Encounter (Signed)
 Pt is now Overdue for her Mammogram per below.  Please place a new order and the patient will be sch.  MM 3D SCREENING MAMMOGRAM BILATERAL BREAST (Accession [id number]) (Order [id number]) Imaging Date: 05/03/2023 Department: [facility] Released By: Roque Manes Authorizing: Narendra, Nischal, MD

## 2024-08-17 ENCOUNTER — Encounter

## 2024-08-21 ENCOUNTER — Ambulatory Visit (INDEPENDENT_AMBULATORY_CARE_PROVIDER_SITE_OTHER)

## 2024-08-21 ENCOUNTER — Ambulatory Visit (INDEPENDENT_AMBULATORY_CARE_PROVIDER_SITE_OTHER): Payer: Self-pay | Admitting: Dietician

## 2024-08-21 VITALS — BP 131/79 | HR 73 | Temp 98.1°F | Ht 64.0 in | Wt 201.2 lb

## 2024-08-21 VITALS — Wt 201.2 lb

## 2024-08-21 DIAGNOSIS — K529 Noninfective gastroenteritis and colitis, unspecified: Secondary | ICD-10-CM

## 2024-08-21 DIAGNOSIS — Z0000 Encounter for general adult medical examination without abnormal findings: Secondary | ICD-10-CM

## 2024-08-21 DIAGNOSIS — E785 Hyperlipidemia, unspecified: Secondary | ICD-10-CM

## 2024-08-21 DIAGNOSIS — E119 Type 2 diabetes mellitus without complications: Secondary | ICD-10-CM

## 2024-08-21 NOTE — Assessment & Plan Note (Signed)
 Patient is taking atorvastatin  40 mg Plan: Will check lipid panel and make adjustments as needed

## 2024-08-21 NOTE — Progress Notes (Signed)
  Medical Nutrition Therapy:  Appt start time: 115 end time:  1300. Total time: 45 minutes Visit # 2  Assessment:  Primary concerns today: weight and glycemic control. This is a 1 moth follow up on goals set at her first visit.  Patient states she has reduced her snacking and sweets and increased her vegetable intake. She did not get her blood glucose meter but would like to have it to be able to check her blood sugars at home. She states she can tell when her blood sugar is high because her vision gets blurry. This happens after drinking regular sodas. She continues to walk 5 days a week for 30-45 minutes each day.   Her weight is decreased by 2 pounds ( 1/2 pound per week) .  ANTHROPOMETRICS:Estimated body mass index is 34.54 kg/m as calculated from the following:   Height as of 07/20/24: 5' 4 (1.626 m).   Weight as of this encounter: 201 lb 3.2 oz (91.3 kg).  WEIGHT HISTORY: Wt Readings from Last 10 Encounters:  08/21/24 201 lb 3.2 oz (91.3 kg)  08/21/24 201 lb 3.2 oz (91.3 kg)  07/20/24 201 lb (91.2 kg)  07/09/24 203 lb 9.6 oz (92.4 kg)  07/03/24 208 lb 4 oz (94.5 kg)  06/15/24 202 lb 3.2 oz (91.7 kg)  06/08/24 205 lb 3.2 oz (93.1 kg)  02/21/24 205 lb (93 kg)  02/07/24 205 lb (93 kg)  01/12/24 205 lb 3.2 oz (93.1 kg)    MEDICATIONS:  states she is taking 0.5 mg Ozempic  weekly and tolerating with desirable side effects of decreased hunger and more control of food intake., she did report taking metformin  for her diabetes  BLOOD SUGAR:not checking at home Lab Results  Component Value Date   HGBA1C 8.1 (A) 06/08/2024   HGBA1C 6.7 (A) 11/04/2023   HGBA1C 6.9 (A) 07/15/2023   HGBA1C 6.0 (H) 02/04/2023   HGBA1C 6.9 (A) 10/01/2022     DIETARY INTAKE: Usual eating pattern includes 2 meals and 2 snacks per day. Everyday foods include see 24 hour recall- raisin bran dry 1-2 cups daily , Diarrhea: 4 semi-formed stools per day, states she is allergic to peanut butter Dining Out  (times/week): need to assess at future visit  24-hr recall:  B ( 11 AM): coffee with sugar and sweetened powdered creamer, egg x1, sausage x2  Snk ( AM): fruit smoothie 8 oz, 1/2 [place] sandwich on whole wheat bread D ( PM): weight watcher dinner, collards greens and mixed vegetables Beverages: sweet tea, sweet coffee, some water    Progress Towards Goal(s):  Some progress.   Nutritional Diagnosis:  NI-5.8.2 Excessive carbohydrate intake As related to high sugar intake.  As evidenced by her high sugar food choices and large portions of high sugar foods. Is improving .    Intervention:  Nutrition education about lower sugar food choices, plate method of diabetes meal planning. Action Goal:decrease sugar intake, A1c and weight  Outcome goal: :decrease sugar intake, A1c and weight Coordination of care: agree with Ozempic , suggest titration as needed  Teaching Method Utilized: Visual, Auditory,Hands on Handouts given during visit include:nutrition handout using plate method of meal planning and  After visit summary  Barriers to learning/adherence to lifestyle change: low health literacy, mental illness Demonstrated degree of understanding via:  Teach Back  athough recall was difficult  Monitoring/Evaluation:  Dietary intake, exercise, meter, and body weight in 4 week(s) Arland Hole, RD 08/21/2024 4:20 PM. .

## 2024-08-21 NOTE — Patient Instructions (Signed)
 Today we discussed the following medical conditions and plan:   Please message us  to let us  know what dose of ozempic  that you are on. In the meantime you can also start using miralax  to help your bowel movements becoming less loose.   I will get some labs to screen for hepatitis, a virus that affects your liver and check your cholesterol today   Please bring in ALL your medications at the next appointment   We look forward to seeing you next time. Please call our clinic at [phone] if you have any questions or concerns. The best time to call is Monday-Friday from 9am-4pm, but there is someone available 24/7. If you need medication refills, please notify your pharmacy one week in advance and they will send us  a request.   Thank you for trusting me with your care. Wishing you the best!  Let us  see you back in two weeks  Remonia Romano, DO  [facility]

## 2024-08-21 NOTE — Assessment & Plan Note (Signed)
 Unsure of what dose of medication patient is on.  Patient is not sure too.  Per fill history seems that she picked up Ozempic  2 mg.  Told patient to check Ozempic  dose and let us  know what dose she is on due to the fact that patient has been having some GI side effects and may need to lower dose of medication.  Plan: May need to lower dose of Ozempic  to 1 mg.  Patient states she will check and message her dose Will check A1c at next visit. Also informed patient that she should bring in all of her medications at the next visit so that we can go over them with her

## 2024-08-21 NOTE — Assessment & Plan Note (Signed)
 Patient does have a history of chronic diarrhea, but states that she has noticed some worsening since being on Ozempic  along with some bloating and early satiety.    Plan: MiraLAX  daily to bulk up stools  Might also need to adjust dose of Ozempic  to a lower dose to help with other symptoms

## 2024-08-21 NOTE — Progress Notes (Signed)
   Established Patient Office Visit  Subjective   Patient ID: [name], female    DOB: 08/28/1971  Age: 53 y.o. MRN: [id number]  Chief Complaint  Patient presents with   Diabetes   Follow-up    HPI  [name] is a 53 year old female with PMH type 2 DM, hypertension, hyperlipidemia, primary cancer of hepatic flexure, congenital hearing loss of both ears that presents today for medication follow up.    ROS Negative except as noted in problem based assessment    Objective:     BP 131/79 (BP Location: Right Arm, Patient Position: Sitting, Cuff Size: Small)   Pulse 73   Temp 98.1 F (36.7 C) (Oral)   Ht 5' 4 (1.626 m)   Wt 201 lb 3.2 oz (91.3 kg)   LMP 07/23/2015 (Exact Date)   SpO2 99%   BMI 34.54 kg/m  BP Readings from Last 3 Encounters:  08/21/24 131/79  07/20/24 110/76  07/03/24 (!) 140/87   Wt Readings from Last 3 Encounters:  08/21/24 201 lb 3.2 oz (91.3 kg)  08/21/24 201 lb 3.2 oz (91.3 kg)  07/20/24 201 lb (91.2 kg)      Physical Exam Constitution: alert, no acute distress  Heart: regular rate and rhythm, no murmurs  Lungs: no respiratory distress, clear to auscultation  Abdomen: Bowel sounds present, no tenderness to palpation in all 4 quadrants No results found for any visits on 08/21/24.  Last hemoglobin A1c Lab Results  Component Value Date   HGBA1C 8.1 (A) 06/08/2024      The ASCVD Risk score (Arnett DK, et al., 2019) failed to calculate for the following reasons:   The valid total cholesterol range is 130 to 320 mg/dL    Assessment & Plan:   Problem List Items Addressed This Visit     Diabetes mellitus type 2, noninsulin dependent (HCC) (Chronic)   Unsure of what dose of medication patient is on.  Patient is not sure too.  Per fill history seems that she picked up Ozempic  2 mg.  Told patient to check Ozempic  dose and let us  know what dose she is on due to the fact that patient has been having some GI side effects and may need to  lower dose of medication.  Plan: May need to lower dose of Ozempic  to 1 mg.  Patient states she will check and message her dose Will check A1c at next visit. Also informed patient that she should bring in all of her medications at the next visit so that we can go over them with her      Hyperlipidemia LDL goal <70 - Primary (Chronic)   Patient is taking atorvastatin  40 mg Plan: Will check lipid panel and make adjustments as needed      Relevant Orders   Lipid Profile   Chronic diarrhea of unknown origin   Patient does have a history of chronic diarrhea, but states that she has noticed some worsening since being on Ozempic  along with some bloating and early satiety.    Plan: MiraLAX  daily to bulk up stools  Might also need to adjust dose of Ozempic  to a lower dose to help with other symptoms       Healthcare maintenance   Screening for hepatitis C today      Relevant Orders   Hepatitis C Antibody     Return in about 2 weeks (around 09/04/2024).    Nan Maya D'Mello, DO Patient seen with Dr. Lovie

## 2024-08-21 NOTE — Assessment & Plan Note (Signed)
Screening for hepatitis C today

## 2024-08-21 NOTE — Patient Instructions (Addendum)
 Breakfast ideas- 1 Beyond sausage, 1 egg, 1 slice whole wheat toast or fruit  Fruit Smoothie- only have a half a cup of smoothie and freeze the rest for tomorrow  Use 93% fat free ground beef   Use Hunt's no salt added pasta sauce   Use diet Dr. Nunzio instead of regular  Good job  Limiting cake and ice cream- if you have made smoothie have 1/2 cup of that instead, but can have 1/2 banana, or a small apple or orange or 1 cup blueberries  Instead of chips- can use rice cakes or lightly salted great value corn tortillas   I'll look into your meter for you.  Follow up in 1 month.  Arland [phone] (new number)

## 2024-08-22 ENCOUNTER — Ambulatory Visit: Payer: Self-pay

## 2024-08-22 ENCOUNTER — Other Ambulatory Visit: Payer: Self-pay | Admitting: Dietician

## 2024-08-22 DIAGNOSIS — E119 Type 2 diabetes mellitus without complications: Secondary | ICD-10-CM

## 2024-08-22 LAB — LIPID PANEL
Chol/HDL Ratio: 2.6 ratio (ref 0.0–4.4)
Cholesterol, Total: 92 mg/dL — ABNORMAL LOW (ref 100–199)
HDL: 36 mg/dL — ABNORMAL LOW (ref >39)
LDL Chol Calc (NIH): 39 mg/dL (ref 0–99)
Triglycerides: 83 mg/dL (ref 0–149)
VLDL Cholesterol Cal: 17 mg/dL (ref 5–40)

## 2024-08-22 LAB — HEPATITIS C ANTIBODY: Hep C Virus Ab: NONREACTIVE

## 2024-08-22 MED ORDER — ACCU-CHEK GUIDE TEST VI STRP: ORAL_STRIP | 11 refills | Status: DC

## 2024-08-22 MED ORDER — ACCU-CHEK SOFTCLIX LANCETS MISC: 11 refills | Status: DC

## 2024-08-22 MED ORDER — ACCU-CHEK GUIDE W/DEVICE KIT: PACK | 0 refills | Status: DC

## 2024-08-22 NOTE — Progress Notes (Signed)
 Internal Medicine Clinic Attending  I was physically present during the key portions of the resident provided service and participated in the medical decision making of patient's management care. I reviewed pertinent patient test results.  The assessment, diagnosis, and plan were formulated together and I agree with the documentation in the resident's note.  Sandy Clarity, MD   This visit for T2DM follow up was challenging because unfortunately [name] did not bring her medicines and isn't 100% sure what she's taking.   She's planning to MyChart message Dr Kem to let her know what dose of Ozempic  she's taking.  We'll arrange close follow up in our clinic and have asked her to please bring all medicines to that appointment so we can adjust them as needed

## 2024-08-22 NOTE — Telephone Encounter (Signed)
 Call per patient request to [facility] pharmacy about her glucometer. Ren does not Chief Technology Officer B. Request testing supplies be sent to [facility]. Patient agreed when notified.

## 2024-08-22 NOTE — Addendum Note (Signed)
 Addended by: Christiane Sistare L on: 08/22/2024 10:47 AM   Modules accepted: Level of Service

## 2024-08-28 ENCOUNTER — Ambulatory Visit
Admission: RE | Admit: 2024-08-28 | Discharge: 2024-08-28 | Disposition: A | Discharge status: Home / Self Care | Source: Ambulatory Visit | Attending: Internal Medicine | Admitting: Internal Medicine

## 2024-08-28 DIAGNOSIS — Z1231 Encounter for screening mammogram for malignant neoplasm of breast: Secondary | ICD-10-CM

## 2024-09-04 ENCOUNTER — Ambulatory Visit: Admitting: Student

## 2024-09-04 VITALS — BP 120/82 | HR 84

## 2024-09-04 DIAGNOSIS — Z7984 Long term (current) use of oral hypoglycemic drugs: Secondary | ICD-10-CM

## 2024-09-04 DIAGNOSIS — Z833 Family history of diabetes mellitus: Secondary | ICD-10-CM

## 2024-09-04 DIAGNOSIS — E119 Type 2 diabetes mellitus without complications: Secondary | ICD-10-CM

## 2024-09-04 DIAGNOSIS — Z79899 Other long term (current) drug therapy: Secondary | ICD-10-CM | POA: Diagnosis not present

## 2024-09-04 DIAGNOSIS — Z8249 Family history of ischemic heart disease and other diseases of the circulatory system: Secondary | ICD-10-CM

## 2024-09-04 DIAGNOSIS — I10 Essential (primary) hypertension: Secondary | ICD-10-CM

## 2024-09-04 DIAGNOSIS — Z7985 Long-term (current) use of injectable non-insulin antidiabetic drugs: Secondary | ICD-10-CM | POA: Diagnosis not present

## 2024-09-04 DIAGNOSIS — Z23 Encounter for immunization: Secondary | ICD-10-CM

## 2024-09-04 LAB — POCT GLYCOSYLATED HEMOGLOBIN (HGB A1C): HbA1c, POC (controlled diabetic range): 6.8 % (ref 0.0–7.0)

## 2024-09-04 LAB — GLUCOSE, CAPILLARY: Glucose-Capillary: 161 mg/dL — ABNORMAL HIGH (ref 70–99)

## 2024-09-04 MED ORDER — SEMAGLUTIDE (1 MG/DOSE) 4 MG/3ML ~~LOC~~ SOPN: 1.0000 mg | PEN_INJECTOR | SUBCUTANEOUS | 3 refills | Status: DC

## 2024-09-04 NOTE — Patient Instructions (Signed)
 Thank you so much for coming to the clinic today!   I'm so glad you're doing well! Your sugar is coming down nicely, and your blood pressure I great! On November 4th you can start taking 1mg  of Ozempic  instead of the .5mg . I have sent in a new prescription.  Please Bring your medications with you at your next visit  If you have any questions please feel free to the call the clinic at anytime at [phone]. It was a pleasure seeing you!  Best, Dr. Messiyah Waterson

## 2024-09-04 NOTE — Progress Notes (Unsigned)
 CC: Chronic condition management  HPI:  [name] is a 53 y.o. female living with a history stated below and presents today for chronic condition management. Please see problem based assessment and plan for additional details.  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 US  Neck 11/06/14: Bilateral nodules. Dominant right lower pole nodule measures 2.3 cm. Findings meet consensus criteria for biopsy. Ultrasound-guided fine needle aspiration should be considered Aspiration pathology: benign follicular nodule   Transfusion of blood product refused for religious reason 12/28/2022    Current Outpatient Medications on File Prior to Visit  Medication Sig Dispense Refill   Accu-Chek Softclix Lancets lancets Use as instructed to monitor blood glucose once daily fasting. 100 each 11   acetaminophen  (TYLENOL ) 500 MG tablet Take 1,000 mg by mouth every 6 (six) hours as needed for moderate pain.     amLODipine  (NORVASC ) 10 MG tablet TAKE 1 TABLET BY MOUTH DAILY 28 tablet 3   atorvastatin  (LIPITOR) 40 MG tablet TAKE 1 TABLET BY MOUTH DAILY 28 tablet 3   benztropine  (COGENTIN ) 1 MG tablet Take 1 mg by mouth daily.     bisacodyl  (DULCOLAX) 5 MG EC tablet Take 5 mg by mouth daily as needed.     Blood Glucose Monitoring  Suppl (ACCU-CHEK GUIDE) w/Device KIT Use as directed to monitor blood sugar once daily fasting. 1 kit 0   Blood Pressure Monitoring (BLOOD PRESSURE CUFF) MISC Use as directed to monitor blood pressure at home 1 each 0   carvedilol  (COREG ) 6.25 MG tablet TAKE 1 TABLET BY MOUTH TWICE A DAY WITH MEALS 56 tablet 3   dapagliflozin  propanediol (FARXIGA ) 10 MG TABS tablet Take 1 tablet (10 mg total) by mouth daily. 90 tablet 3   diphenhydrAMINE  (BENADRYL ) 50 MG tablet Take 50 mg of Benadryl  1 hour prior to your CT scan 1 tablet 0   DULoxetine  (CYMBALTA ) 30 MG capsule Take 1 capsule (30 mg total) by mouth daily. 30 capsule 3   ferrous sulfate  325 (65 FE) MG EC tablet Take 1 tablet (325 mg total) by mouth every other day. 15 tablet 2   fluticasone  (FLONASE ) 50 MCG/ACT nasal spray Place 2 sprays into both nostrils daily as needed for allergies or rhinitis.     glucose blood (ACCU-CHEK GUIDE TEST) test strip Use as instructed to monitor blood sugar once daily 50 each 11   metFORMIN  (GLUCOPHAGE -XR) 500 MG 24 hr tablet Take 2 tablets (1,000 mg total) by mouth 2 (two) times daily with a meal. 360 tablet 3   Multiple Vitamins-Minerals (HAIR SKIN & NAILS) TABS Take 1 tablet by mouth daily.  omeprazole  (PRILOSEC) 40 MG capsule Take 1 capsule (40 mg total) by mouth daily. 30 capsule 1   ondansetron  (ZOFRAN -ODT) 4 MG disintegrating tablet Take 1 tablet (4 mg total) by mouth every 8 (eight) hours as needed for nausea or vomiting. 10 tablet 0   Paliperidone ER (INVEGA SUSTENNA) injection Inject 117 mg into the muscle every 30 (thirty) days.     predniSONE  (DELTASONE ) 50 MG tablet Take 1 tab (50mg ) by mouth 13hrs prior to CT Scan. Then take 1 tab (50mg ) by mouth 7hrs prior to CT Scan. Then take 1 tab (50mg ) by mouth 1hr prior to CT Scan. 3 tablet 1   No current facility-administered medications on file prior to visit.    Family History  Problem Relation Age of Onset   Diabetes Mother    Breast cancer Mother 17    Heart failure Mother    Hypertension Mother    Diabetes Mellitus II Mother    Colon cancer Father 60   Diabetes Mellitus II Father    Hypertension Father    Breast cancer Maternal Aunt 37   Lung cancer Maternal Grandmother    Esophageal cancer Neg Hx    Pancreatic cancer Neg Hx    Ulcerative colitis Neg Hx    Colon polyps Neg Hx    Rectal cancer Neg Hx    Stomach cancer Neg Hx     Social History   Socioeconomic History   Marital status: Married    Spouse name: Not on file   Number of children: 1   Years of education: Not on file   Highest education level: Not on file  Occupational History   Not on file  Tobacco Use   Smoking status: Never   Smokeless tobacco: Never  Vaping Use   Vaping status: Never Used  Substance and Sexual Activity   Alcohol use: No   Drug use: No   Sexual activity: Not Currently    Birth control/protection: Surgical  Other Topics Concern   Not on file  Social History Narrative   Not on file   Social Drivers of Health   Financial Resource Strain: Medium Risk (07/20/2024)   Overall Financial Resource Strain (CARDIA)    Difficulty of Paying Living Expenses: Somewhat hard  Food Insecurity: No Food Insecurity (07/20/2024)   Hunger Vital Sign    Worried About Running Out of Food in the Last Year: Never true    Ran Out of Food in the Last Year: Never true  Transportation Needs: No Transportation Needs (07/20/2024)   PRAPARE - Administrator, Civil Service (Medical): No    Lack of Transportation (Non-Medical): No  Physical Activity: Sufficiently Active (07/20/2024)   Exercise Vital Sign    Days of Exercise per Week: 5 days    Minutes of Exercise per Session: 50 min  Stress: No Stress Concern Present (07/20/2024)   [employer] of Occupational Health - Occupational Stress Questionnaire    Feeling of Stress: Only a little  Social Connections: Moderately Isolated (07/15/2023)   Social Connection and Isolation Panel    Frequency of  Communication with Friends and Family: Twice a week    Frequency of Social Gatherings with Friends and Family: Once a week    Attends Religious Services: More than 4 times per year    Active Member of [employer] or Organizations: No    Attends Banker Meetings: Never    Marital Status: Separated  Intimate Partner Violence: Not At Risk (07/20/2024)  Humiliation, Afraid, Rape, and Kick questionnaire    Fear of Current or Ex-Partner: No    Emotionally Abused: No    Physically Abused: No    Sexually Abused: No    Review of Systems: ROS negative except for what is noted on the assessment and plan.  Vitals:   09/04/24 0952  BP: 120/82  Pulse: 84    Physical Exam: Constitutional: well-appearing female in no acute distress Cardiovascular: regular rate and rhythm, no m/r/g Pulmonary/Chest: normal work of breathing on room air, lungs clear to auscultation bilaterally Abdominal: soft, non-tender, non-distended  Assessment & Plan:   Diabetes mellitus type 2, noninsulin dependent (HCC) A1c much improved from 8.1-6.8 today.  She is currently taking 0.5 mg of Ozempic  weekly, Farxiga  10 mg, and metformin  at 1000 mg twice a day.  She states that she has been eating better, and less.  Will increase her Ozempic  to 1 mg.  Also informed patient to bring all of her medications to her visit next time.  Hypertension Well-controlled today, on amlodipine  10 mg and Coreg  6.25 mg twice a day will continue.  Pneumococcal vaccine administered Pneumococcal vaccine given today.   Patient discussed with Dr. Machen  Eddrick Dilone, M.D. [facility], PGY-3 Pager: [phone] Date 09/05/2024 Time 3:32 PM

## 2024-09-05 DIAGNOSIS — Z23 Encounter for immunization: Secondary | ICD-10-CM | POA: Insufficient documentation

## 2024-09-05 NOTE — Assessment & Plan Note (Signed)
 Well-controlled today, on amlodipine  10 mg and Coreg  6.25 mg twice a day will continue.

## 2024-09-05 NOTE — Assessment & Plan Note (Signed)
 A1c much improved from 8.1-6.8 today.  She is currently taking 0.5 mg of Ozempic  weekly, Farxiga  10 mg, and metformin  at 1000 mg twice a day.  She states that she has been eating better, and less.  Will increase her Ozempic  to 1 mg.  Also informed patient to bring all of her medications to her visit next time.

## 2024-09-05 NOTE — Assessment & Plan Note (Signed)
Pneumococcal vaccine given today 

## 2024-09-10 NOTE — Progress Notes (Signed)
 Internal Medicine Clinic Attending  Case discussed with the resident at the time of the visit.  We reviewed the resident's history and exam and pertinent patient test results.  I agree with the assessment, diagnosis, and plan of care documented in the resident's note.

## 2024-09-12 ENCOUNTER — Other Ambulatory Visit: Payer: Self-pay | Admitting: Student

## 2024-09-12 DIAGNOSIS — I10 Essential (primary) hypertension: Secondary | ICD-10-CM

## 2024-09-13 NOTE — Telephone Encounter (Signed)
 Medication sent to pharmacy

## 2024-09-17 ENCOUNTER — Other Ambulatory Visit (HOSPITAL_COMMUNITY): Payer: Self-pay

## 2024-09-17 ENCOUNTER — Other Ambulatory Visit: Payer: Self-pay

## 2024-09-17 ENCOUNTER — Ambulatory Visit (INDEPENDENT_AMBULATORY_CARE_PROVIDER_SITE_OTHER)

## 2024-09-17 DIAGNOSIS — I10 Essential (primary) hypertension: Secondary | ICD-10-CM | POA: Diagnosis not present

## 2024-09-17 DIAGNOSIS — Z7984 Long term (current) use of oral hypoglycemic drugs: Secondary | ICD-10-CM | POA: Diagnosis not present

## 2024-09-17 DIAGNOSIS — E119 Type 2 diabetes mellitus without complications: Secondary | ICD-10-CM | POA: Diagnosis present

## 2024-09-17 DIAGNOSIS — Z7985 Long-term (current) use of injectable non-insulin antidiabetic drugs: Secondary | ICD-10-CM | POA: Diagnosis not present

## 2024-09-17 MED ORDER — ACCU-CHEK GUIDE TEST VI STRP
ORAL_STRIP | 11 refills | Status: DC
Start: 2024-09-17 — End: 2024-09-17
  Filled 2024-09-17: qty 50, 50d supply, fill #0

## 2024-09-17 MED ORDER — ACCU-CHEK GUIDE TEST VI STRP: ORAL_STRIP | 11 refills | Status: AC

## 2024-09-17 MED ORDER — ACCU-CHEK SOFTCLIX LANCETS MISC
11 refills | Status: DC
Start: 2024-09-17 — End: 2024-09-17
  Filled 2024-09-17: qty 100, fill #0

## 2024-09-17 MED ORDER — ACCU-CHEK SOFTCLIX LANCETS MISC: 11 refills | Status: AC

## 2024-09-17 MED ORDER — ACCU-CHEK GUIDE W/DEVICE KIT: PACK | 0 refills | Status: AC

## 2024-09-17 MED ORDER — ACCU-CHEK GUIDE W/DEVICE KIT
PACK | 0 refills | Status: DC
Start: 2024-09-17 — End: 2024-09-17
  Filled 2024-09-17: qty 1, fill #0

## 2024-09-17 NOTE — Patient Instructions (Addendum)
 It was nice to see you today!  Your goal blood sugar is 80-130 mg/dL before eating and less than 180 mg/dL after eating. Monitor blood sugars at home and keep a log (glucometer or piece of paper) to bring with you to your next visit.  Medication Changes: Continue Ozempic  1 mg weekly, Farxiga  10 mg daily, Metformin  XR 1000 mg twice daily as included in your pill packs  We will look into getting your meter filled so you can check your blood sugars  Let us  know if you have issues tolerating Ozempic   Continue all other medication the same.   If you experience symptoms of a low blood sugar (dizzy, shaky, sweaty) check your blood sugar. If it is less than 70 mg/dL, you should eat or drink 15 grams of fast-acting carbohydrates like 4 glucose tablets, 1/2 cup of regular soda, or 1/2 cup of juice. Recheck your blood sugar after 15 minutes. If the level is still below 70 mg/dL repeat the process. If this occurs frequently, you should notify your healthcare provider.   Lifestyle Recommendations:  Aim for 150 minutes of moderate intensity exercise every week. This is any activity that elevates your heart rate and breathing rate, but still allows you to carry on a conversation. Exercising after meals can help prevent spikes in your blood sugar.  Diet Recommendations:   Try to eat 3 real meals using the healthy plate method and 1-2 snacks per day. Never go more than 4-5 hours while awake without eating. Eat breakfast within the first hour of getting up.     Carbohydrates include starch, sugar, and fiber. Sugar and starch raise blood glucose.  Starchy foods include bread, rice, pasta, potatoes, corn, cereal, grits, crackers, bagels, muffins, all baked foods Some fruits are higher in sugar, like grapes, watermelon, oranges, and most tropical fruits. Limit your serving size to 1/2 cup at a time.  Protein foods include meat, fish, poultry, eggs, dairy, and beans (although beans also provide  carbohydrates).  Non-starchy vegetables do not impact your blood sugar very much. These include greens, broccoli, asparagus, carrots, cauliflower, cucumber, mushrooms, peppers, yellow squash, zucchini squash, tomato, to name a few!  Avoid all sugary beverages. Stay hydrated with water  throughout the day. Sparkling/flavored water , unsweet tea, black coffee, and zero-sugar or diet drinks will not impact your blood sugar, but they should not be used as your only source of hydration!  You can find more information at https://diabetes.org/food-nutrition/eating-healthy   [name], PharmD [facility] [phone]

## 2024-09-17 NOTE — Progress Notes (Signed)
 09/17/2024 Name: [name] MRN: [id number] DOB: [birth year]  Chief Complaint  Patient presents with   Diabetes    [name] is a 53 y.o. year old female who was referred for medication management by their primary care provider, Sandy Sharper, MD. They presented for a face to face visit today.   They were referred to the pharmacist by their PCP for assistance in managing diabetes . PMH includes HTN, OSA, T2DM, hearing loss (no hearing in L ear, difficulty hearing in R ear), BMI > 30, HLD.    Subjective: Patient was referred by PCP, Sandy Pion, DO, on 06/15/24. At this visit, patient reported that she had not been taking Trulicity , only metformin . She had not been monitoring her BG at home. A1C increased from 6.7% to 8.1%.  She was instructed to restart GLP-1RA and was initiated on Ozempic  0.25 mg weekly. She was referred to [facility] for education and pharmacy for medication management, and addition of SGLT2i in the future given elevated UACR. She was seen for follow up by Dr. Charmayne on 07/20/24. She reported that she had completed 4 weeks of Ozempic  0.25 mg and was tolerating well. She reported making healthier food choices. She was instructed to increase Ozempic  to 0.5 mg weekly. At pharmacy appt on 07/23/24, patient was encouraged to increase Ozempic  to 0.5 mg weekly as previously instructed. She was transitioned from metformin  IR to XR since she was reporting diarrhea, and also was instructed to start Farxiga  10 mg daily for microalbuminuria. At PCP visit on 09/04/24. BP was controlled. A1C improved to 6.8%. She was wanting to increase Ozempic  to 1 mg weekly.  Today, patient reports doing well. She says she has started Ozempic  1 mg and taken once dose (though note says she would start on 11/4). She denies worsened GI AE, feels she is tolerating it well. Her only concern is that her L pinky toenail keeps falling off. Reports some burning and pain before this occurs, but says there is no  bleeding, no open wound. Said it does not appear swollen or inflamed. She would like to return for a doctor's visit in a couple weeks to have it checked out.  Care Team: Primary Care Provider: schedule ~Jan-Feb 2026  Medication Access/Adherence  Current Pharmacy:  [facility] [address] ([address]), [place] - [address] [place] ([place]) [place] [address] Phone: [phone] Fax: [phone]  [id number] - [place], [place] - [address] [place] [address] Phone: [phone] Fax: [phone]  [facility] [place], [place] [address] [address] [place] [address] Phone: [phone] Fax: [phone]  [facility] - [place], [place] - [address] [place] [address] Phone: [phone] Fax: [phone]  [id number] [place], [place] - [address] AT [address] [place] [address] Phone: [phone] Fax: [phone]   Patient reports affordability concerns with their medications: No  - but has had difficulty picking up blood sugar testing supplies from [facility] (appears it has to be processed through part B) Patient reports access/transportation concerns to their pharmacy: Yes  - sometimes her brother or sister will pick up her medications from [facility] for her, gets Ozempic  from [facility]. Patient reports adherence concerns with their medications:  No     Diabetes:  Current medications: metformin  XR 1000 mg BID, Ozempic  1 mg weekly (reports  she has taken once dose), Farxiga  10 mg daily (denies GU AE) Medications tried in the past: Canagliflozin  d/c in 2021 due to recurrent yeast infection - however A1C ranged from 9.3-12.1% at that time  Patient denies nausea/vomiting and abdominal pain since starting Ozempic   Current glucose readings: not checking at home, does not  have supplies - but would like to restart checking.  Patient denies hypoglycemic s/sx including dizziness, shakiness, sweating. Patient denies hyperglycemic symptoms including polyuria, polydipsia, polyphagia, nocturia, neuropathy, blurred vision.  Current meal patterns: Eating 2 meals/day - weight watchers meals. Reports she is eating in the morning and late afternoon.  - Breakfast: yogurt in the morning with fruit - Lunch: corn beef hash, sometimes grits - Snacks: carrot cake from [employer] - Drinks: prune leaf tea, coffee with sugar and cream, occasional regular soda (not having diet or zero sugar right now), having ~4 bottles of water  per day  Current physical activity: Walking 45 minutes/day M-F  Current medication access support: [employer] Gold Plus  Macrovascular and Microvascular Risk Reduction:  Statin? yes (atorvastatin  40 mg daily); ACEi/ARB? Cough with lisinopril  and losartan  Last urinary albumin /creatinine ratio:  Lab Results  Component Value Date   MICRALBCREAT 191 (H) 06/08/2024   MICRALBCREAT 11 07/15/2023   MICRALBCREAT 7 07/12/2022   MICRALBCREAT 16 06/11/2021   MICRALBCREAT 20 05/30/2020   MICRALBCREAT 14.2 03/21/2018   MICRALBCREAT 3.7 12/15/2016   MICRALBCREAT 21.3 09/16/2015   MICRALBCREAT 5.9 07/30/2014   Last eye exam:  Lab Results  Component Value Date   HMDIABEYEEXA No Retinopathy 05/01/2024   Last foot exam: 06/08/2024 Tobacco Use:  Tobacco Use: Low Risk  (07/03/2024)   Patient History    Smoking Tobacco Use: Never    Smokeless Tobacco Use: Never    Passive Exposure: Not on file    Hypertension:  Current medications: amlodipine  10 mg daily, carvedilol  6.25 mg BID with meals Medications previously tried: lisinopril  and losartan  (cough)  Patient does not have a validated, automated, upper arm home BP cuff  Patient reports hypotensive s/sx including dizziness, lightheadedness that is happening a couple times per week - primarily when she goes from  laying down to standing Patient denies hypertensive symptoms including headache, chest pain, shortness of breath   Objective:  BP Readings from Last 3 Encounters:  09/17/24 116/85  09/04/24 120/82  08/21/24 131/79    Lab Results  Component Value Date   HGBA1C 6.8 09/04/2024   HGBA1C 8.1 (A) 06/08/2024   HGBA1C 6.7 (A) 11/04/2023       Latest Ref Rng & Units 07/03/2024    9:27 AM 01/12/2024    8:52 AM 11/04/2023    9:45 AM  BMP  Glucose 70 - 99 mg/dL 872  870  887   BUN 6 - 20 mg/dL 7  8  12    Creatinine 0.44 - 1.00 mg/dL 9.34  9.28  8.88   BUN/Creat Ratio 9 - 23   11   Sodium 135 - 145 mmol/L 142  142  140   Potassium 3.5 - 5.1 mmol/L 4.1  3.9  3.6   Chloride 98 - 111 mmol/L 107  102  97   CO2 22 - 32 mmol/L 31  33  25   Calcium  8.9 - 10.3 mg/dL 9.4  9.5  9.6     Lab Results  Component Value Date   CHOL 92 (L) 08/21/2024   HDL 36 (L) 08/21/2024   LDLCALC 39 08/21/2024   TRIG 83 08/21/2024   CHOLHDL 2.6  08/21/2024    Medications Reviewed Today     Reviewed by Brinda Lorain SQUIBB, RPH (Pharmacist) on 09/17/24 at 1628  Med List Status: <None>   Medication Order Taking? Sig Documenting Provider Last Dose Status Informant  Accu-Chek Softclix Lancets lancets [id number]  Use as instructed to monitor blood glucose once daily fasting. Nooruddin, Saad, MD  Active   acetaminophen  (TYLENOL ) 500 MG tablet [id number]  Take 1,000 mg by mouth every 6 (six) hours as needed for moderate pain. [provider]  Active Self  amLODipine  (NORVASC ) 10 MG tablet [id number] Yes TAKE 1 TABLET BY MOUTH DAILY McLendon, Michael, MD  Active   atorvastatin  (LIPITOR) 40 MG tablet [id number] Yes TAKE 1 TABLET BY MOUTH DAILY McLendon, Michael, MD  Active   benztropine  (COGENTIN ) 1 MG tablet [id number] Yes Take 1 mg by mouth daily. [provider]  Active   bisacodyl  (DULCOLAX) 5 MG EC tablet [id number]  Take 5 mg by mouth daily as needed. [provider]  Active   Blood Glucose  Monitoring Suppl (ACCU-CHEK GUIDE) w/Device KIT [id number]  Use as directed to monitor blood sugar once daily fasting.  Patient not taking: Reported on 09/17/2024   Nooruddin, Saad, MD  Active     Discontinued 09/17/24 1143 (Not covered by the pt's insurance)   carvedilol  (COREG ) 6.25 MG tablet [id number] Yes TAKE 1 TABLET BY MOUTH TWICE A DAY WITH MEALS Sandy Sharper, MD  Active   dapagliflozin  propanediol (FARXIGA ) 10 MG TABS tablet [id number] Yes Take 1 tablet (10 mg total) by mouth daily. [name] Holmes, DO  Active   diphenhydrAMINE  (BENADRYL ) 50 MG tablet [id number]  Take 50 mg of Benadryl  1 hour prior to your CT scan Lanny Callander, MD  Active   DULoxetine  (CYMBALTA ) 30 MG capsule [id number] Yes Take 1 capsule (30 mg total) by mouth daily. Sandy Sharper, MD  Active Self  ferrous sulfate  325 (65 FE) MG EC tablet [id number]  Take 1 tablet (325 mg total) by mouth every other day. Sandy Sharper, MD  Active Self           Med Note VIRGIA, RACHEL E   Tue Feb 21, 2024  8:22 AM) Per patient, last took in 2024  fluticasone  (FLONASE ) 50 MCG/ACT nasal spray [id number]  Place 2 sprays into both nostrils daily as needed for allergies or rhinitis. [provider]  Active Self  glucose blood (ACCU-CHEK GUIDE TEST) test strip [id number]  Use as instructed to monitor blood sugar once daily Nooruddin, Saad, MD  Active   metFORMIN  (GLUCOPHAGE -XR) 500 MG 24 hr tablet [id number] Yes Take 2 tablets (1,000 mg total) by mouth 2 (two) times daily with a meal. [name] Holmes, DO  Active   Multiple Vitamins-Minerals (HAIR SKIN & NAILS) TABS [id number]  Take 1 tablet by mouth daily. [provider]  Active Self  omeprazole  (PRILOSEC) 40 MG capsule [id number]  Take 1 capsule (40 mg total) by mouth daily. Leigh Elspeth SQUIBB, MD  Active Self  ondansetron  (ZOFRAN -ODT) 4 MG disintegrating tablet [id number]  Take 1 tablet (4 mg total) by mouth every 8 (eight) hours as needed for nausea or vomiting. Sponseller,  Rebekah R, PA-C  Active   Paliperidone ER (INVEGA SUSTENNA) injection [id number] Yes Inject 117 mg into the muscle every 30 (thirty) days. [provider]  Active Self  predniSONE  (DELTASONE ) 50 MG tablet [id number]  Take 1 tab (50mg ) by mouth 13hrs prior to CT Scan. Then take 1 tab (50mg ) by mouth 7hrs prior to CT Scan. Then  take 1 tab (50mg ) by mouth 1hr prior to CT Scan. Lanny Callander, MD  Active   Semaglutide , 1 MG/DOSE, 4 MG/3ML SOPN [id number] Yes Inject 1 mg into the skin once a week. Nooruddin, Saad, MD  Active   Med List Note Delsa Lorain SQUIBB, [facility] 09/17/24 1144): Darin Bosques(son) [phone], patient uses [facility] for pill packs (send oral medications to this pharmacy)              Assessment/Plan:   Diabetes: - Currently controlled with most recent A1C of 6.8% above goal <7% and improved from 8.1% since starting Ozempic . Agree with decision to continue titrating up to 1 mg weekly, as patient appears to be tolerating well. She is also tolerating SGLT2i, which is indicated for microalbuminuria, especially since she has been unable to tolerate ACEi or ARB.  - Last UACR July 2025 - 191 mg/dL.  - Reviewed long term cardiovascular and renal outcomes of uncontrolled blood sugar - Reviewed goal A1c, goal fasting, and goal 2 hour post prandial glucose - Reviewed dietary modifications including  utilizing the healthy plate method, limiting portion size of carbohydrate foods, increasing intake of protein and non-starchy vegetables. Counseled patient to stay hydrated with water  throughout the day. Discussed avoiding sodas and limiting sweets - Reviewed lifestyle modifications including: aiming for 150 minutes of moderate intensity exercise every week.  - Recommend to continue Ozempic  1 mg weekly  - Recommend to continue metformin  XR 1000 mg BID - Recommend to continue Farxiga  (dapagliflozin )10 mg daily. Patient counseled on administration and potential AE associated with SGLT2i including  GU infections and dehydration. Instructed to stay hydrated with water  throughout the day. - Recommend to check glucose once daily fasting a couple times per week. Called [facility] who was able to process Accu Chek glucometer and supplies for $10 through Medicare Part B ([facility], [facility], and [facility] were unable to do this). Notified patient that she can pick up supplies this week. - Next A1C due 12/06/23    Hypertension: - Currently uncontrolled with clinic BP slightly above goal less than 130/80, however, patient has also recently had readings below goal and is not able to monitor at home. She denies s/sx of hypotension. If BP continues to be uncontrolled at upcoming appts, could consider addition of low dose hydrochlorothiazide . She does not appear to have an alternative indication for a beta-blocker, other than hypertension. - Reviewed long term cardiovascular and renal outcomes of uncontrolled blood pressure - Reviewed appropriate blood pressure monitoring technique and reviewed goal blood pressure. She has not been able to get a blood pressure cuff through insurance. - Recommend to continue amlodipine  10 mg daily and carvedilol  6.25 mg BID    Written patient instructions provided. Patient verbalized understanding of treatment plan.    Follow Up Plan:  Pharmacist 11/19/24 - encouraged patient to reach out earlier if needed  PCP clinic visit 10/15/24 (f/u of L 5th toe)   Lorain Baseman, PharmD [facility] [phone]

## 2024-10-15 ENCOUNTER — Ambulatory Visit

## 2024-11-12 ENCOUNTER — Other Ambulatory Visit: Payer: Self-pay | Admitting: Student

## 2024-11-12 NOTE — Telephone Encounter (Signed)
 Medication sent to pharmacy

## 2024-11-19 ENCOUNTER — Ambulatory Visit (INDEPENDENT_AMBULATORY_CARE_PROVIDER_SITE_OTHER)

## 2024-11-19 DIAGNOSIS — I10 Essential (primary) hypertension: Secondary | ICD-10-CM

## 2024-11-19 DIAGNOSIS — E119 Type 2 diabetes mellitus without complications: Secondary | ICD-10-CM

## 2024-11-19 DIAGNOSIS — Z8249 Family history of ischemic heart disease and other diseases of the circulatory system: Secondary | ICD-10-CM | POA: Diagnosis not present

## 2024-11-19 DIAGNOSIS — Z7985 Long-term (current) use of injectable non-insulin antidiabetic drugs: Secondary | ICD-10-CM | POA: Diagnosis not present

## 2024-11-19 DIAGNOSIS — Z79899 Other long term (current) drug therapy: Secondary | ICD-10-CM

## 2024-11-19 DIAGNOSIS — Z833 Family history of diabetes mellitus: Secondary | ICD-10-CM | POA: Diagnosis not present

## 2024-11-19 DIAGNOSIS — Z7984 Long term (current) use of oral hypoglycemic drugs: Secondary | ICD-10-CM | POA: Diagnosis not present

## 2024-11-19 MED ORDER — AMLODIPINE BESYLATE 10 MG PO TABS: 10.0000 mg | ORAL_TABLET | Freq: Every day | ORAL | 5 refills | Status: AC

## 2024-11-19 MED ORDER — CARVEDILOL 6.25 MG PO TABS: 6.2500 mg | ORAL_TABLET | Freq: Two times a day (BID) | ORAL | 5 refills | Status: AC

## 2024-11-19 NOTE — Progress Notes (Signed)
 "  11/19/2024 Name: [name] MRN: [id number] DOB: 10/29/1971  Chief Complaint  Patient presents with   Diabetes   Hypertension    [name] is a 54 y.o. year old female who was referred for medication management by their primary care provider, Sandy Sharper, MD. They presented for a face to face visit today.   They were referred to the pharmacist by their PCP for assistance in managing diabetes . PMH includes HTN, OSA, T2DM, hearing loss (no hearing in L ear, difficulty hearing in R ear), BMI > 30, HLD.    Subjective: Patient was referred by PCP, Sandy Pion, DO, on 06/15/24. At this visit, patient reported that she had not been taking Trulicity , only metformin . She had not been monitoring her BG at home. A1C increased from 6.7% to 8.1%.  She was instructed to restart GLP-1RA and was initiated on Ozempic  0.25 mg weekly. She was referred to [facility] for education and pharmacy for medication management, and addition of SGLT2i in the future given elevated UACR. She was seen for follow up by [name] on 07/20/24. She reported that she had completed 4 weeks of Ozempic  0.25 mg and was tolerating well. She reported making healthier food choices. She was instructed to increase Ozempic  to 0.5 mg weekly. At pharmacy appt on 07/23/24, patient was encouraged to increase Ozempic  to 0.5 mg weekly as previously instructed. She was transitioned from metformin  IR to XR since she was reporting diarrhea, and also was instructed to start Farxiga  10 mg daily for microalbuminuria. At PCP visit on 09/04/24. BP was controlled. A1C improved to 6.8%. She was wanting to increase Ozempic  to 1 mg weekly. At pharmacy visit on 09/17/24, she had started Ozempic  1 mg x 1 dose without issue. Assisted in ensuring glucometer supplies were covered through Medicare Part B.  Today, patient reports doing well. She is taking Ozempic  1mg  weekly without issue. No nausea/vomiting. States appetite is lower but she is still eating  regularly throughout the day. Following weight watches diet, making healthy food choices. She did not pick up glucometer supplies after our last visit. She notes that she is moving to [name] soon (unsure when) and needs assistance transferring care. She is having an issue with receiving her pill packs on time from the ACT team. She took her BP meds this morning around 7AM.   Care Team: Primary Care Provider: schedule ~Jan-Feb 2026  Medication Access/Adherence  Current Pharmacy:  [facility] [address] ([address]), [place] - [address] [place] ([place]) [place] [address] Phone: [phone] Fax: [phone]  [facility] - [place], [place] - [address] [place] [address] Phone: [phone] Fax: [phone]  [address] [place], [place] [address] [place] [address] [place] [address] Phone: [phone] Fax: [phone]  [facility] - [place], [place] - [address] [place] [address] Phone: [phone] Fax: [phone]  [id number] [place], [place] - [address] [place] [address] Phone: [phone] Fax: [phone]   Patient reports affordability concerns with their medications: No  - but has had difficulty picking up blood sugar testing supplies from [facility] (appears it has to be processed through part B) Patient reports access/transportation concerns to their pharmacy: Yes  - sometimes her brother or sister will pick up her medications from [place] pharmacy for her, gets Ozempic   from [place]. Patient reports adherence concerns with their medications:  No     Diabetes:  Current medications: metformin  XR 1000 mg BID, Ozempic  1 mg weekly (due for refill), Farxiga  10 mg daily (denies GU AE) Medications tried in the past: Canagliflozin  d/c in 2021 due to recurrent  yeast infection - however A1C ranged from 9.3-12.1% at that time  Patient denies nausea/vomiting and abdominal pain since starting Ozempic   Current glucose readings: not checking at home, does not have supplies although confirmed at last visit [facility] could fill for $10. She may go after appt today since she has transportation.  Patient denies hypoglycemic s/sx including dizziness, shakiness, sweating. Patient denies hyperglycemic symptoms including polyuria, polydipsia, polyphagia, nocturia, neuropathy, blurred vision.  Current meal patterns: Eating 2 meals/day - weight watchers meals. Reports she is eating in the morning and late afternoon.  - Breakfast: yogurt in the morning with fruit - Lunch: corn beef hash, sometimes grits, green salads - Snacks: carrot cake from [facility], juice made from apples, carrots, celery - Drinks: prune leaf tea, coffee with sugar and cream, occasional regular soda (not having diet or zero sugar right now), having ~4 bottles of water  per day  Current physical activity: Walking 45 minutes/day M-F  Current medication access support: [employer] Gold Plus  Macrovascular and Microvascular Risk Reduction:  Statin? yes (atorvastatin  40 mg daily); ACEi/ARB? Cough with lisinopril  and losartan  Last urinary albumin /creatinine ratio:  Lab Results  Component Value Date   MICRALBCREAT 191 (H) 06/08/2024   MICRALBCREAT 11 07/15/2023   MICRALBCREAT 7 07/12/2022   MICRALBCREAT 16 06/11/2021   MICRALBCREAT 20 05/30/2020   MICRALBCREAT 14.2 03/21/2018   MICRALBCREAT 3.7 12/15/2016   MICRALBCREAT 21.3 09/16/2015   MICRALBCREAT 5.9 07/30/2014   Last eye exam:  Lab Results  Component Value Date   HMDIABEYEEXA No Retinopathy 05/01/2024   Last foot exam: 06/08/2024 Tobacco Use:  Tobacco Use: Low Risk (07/03/2024)   Patient History    Smoking Tobacco Use: Never    Smokeless Tobacco Use: Never    Passive Exposure: Not on file    Hypertension:  Current medications:  amlodipine  10 mg daily, carvedilol  6.25 mg BID with meals Medications previously tried: lisinopril  and losartan  (cough)  Patient does not have a validated, automated, upper arm home BP cuff  Patient denies hypotensive s/sx including dizziness, lightheadedness. Has previously reported some dizziness with standing. She does also have hearing loss. Patient denies hypertensive symptoms including headache, chest pain, shortness of breath   Objective:  BP Readings from Last 3 Encounters:  11/19/24 128/84  09/17/24 116/85  09/04/24 120/82    Lab Results  Component Value Date   HGBA1C 6.8 09/04/2024   HGBA1C 8.1 (A) 06/08/2024   HGBA1C 6.7 (A) 11/04/2023       Latest Ref Rng & Units 07/03/2024    9:27 AM 01/12/2024    8:52 AM 11/04/2023    9:45 AM  BMP  Glucose 70 - 99 mg/dL 872  870  887   BUN 6 - 20 mg/dL 7  8  12    Creatinine 0.44 - 1.00 mg/dL 9.34  9.28  8.88   BUN/Creat Ratio 9 - 23   11   Sodium 135 - 145 mmol/L 142  142  140   Potassium 3.5 - 5.1 mmol/L 4.1  3.9  3.6   Chloride 98 - 111 mmol/L 107  102  97   CO2 22 - 32 mmol/L 31  33  25   Calcium  8.9 - 10.3 mg/dL  9.4  9.5  9.6     Lab Results  Component Value Date   CHOL 92 (L) 08/21/2024   HDL 36 (L) 08/21/2024   LDLCALC 39 08/21/2024   TRIG 83 08/21/2024   CHOLHDL 2.6 08/21/2024    Medications Reviewed Today     Reviewed by Brinda Lorain SQUIBB, RPH-CPP (Pharmacist) on 11/19/24 at 1431  Med List Status: <None>   Medication Order Taking? Sig Documenting Provider Last Dose Status Informant  Accu-Chek Softclix Lancets lancets [id number]  Use as instructed to monitor blood glucose once daily fasting. Nooruddin, Saad, MD  Active   acetaminophen  (TYLENOL ) 500 MG tablet [id number]  Take 1,000 mg by mouth every 6 (six) hours as needed for moderate pain. [provider]  Active Self  amLODipine  (NORVASC ) 10 MG tablet [id number] Yes TAKE 1 TABLET BY MOUTH DAILY McLendon, Michael, MD  Active   atorvastatin  (LIPITOR) 40 MG  tablet [id number] Yes TAKE 1 TABLET BY MOUTH DAILY McLendon, Michael, MD  Active   benztropine  (COGENTIN ) 1 MG tablet [id number] Yes Take 1 mg by mouth daily. [provider]  Active   bisacodyl  (DULCOLAX) 5 MG EC tablet [id number]  Take 5 mg by mouth daily as needed. [provider]  Active   Blood Glucose Monitoring Suppl (ACCU-CHEK GUIDE) w/Device KIT [id number]  Use as directed to monitor blood sugar once daily fasting.  Patient not taking: Reported on 09/17/2024   Nooruddin, Saad, MD  Active   carvedilol  (COREG ) 6.25 MG tablet [id number] Yes TAKE 1 TABLET BY MOUTH TWICE A DAY WITH MEALS [place], MD  Active   dapagliflozin  propanediol (FARXIGA ) 10 MG TABS tablet [id number] Yes Take 1 tablet (10 mg total) by mouth daily. [place], DO  Active   diphenhydrAMINE  (BENADRYL ) 50 MG tablet [id number]  Take 50 mg of Benadryl  1 hour prior to your CT scan Lanny Callander, MD  Active   DULoxetine  (CYMBALTA ) 30 MG capsule [id number] Yes Take 1 capsule (30 mg total) by mouth daily. [place]per, MD  Active Self  ferrous sulfate  325 (65 FE) MG EC tablet [id number]  Take 1 tablet (325 mg total) by mouth every other day. [place]per, MD  Active Self           Med Note VIRGIA, RACHEL E   Tue Feb 21, 2024  8:22 AM) Per patient, last took in 2024  fluticasone  (FLONASE ) 50 MCG/ACT nasal spray [id number]  Place 2 sprays into both nostrils daily as needed for allergies or rhinitis. [provider]  Active Self  glucose blood (ACCU-CHEK GUIDE TEST) test strip [id number]  Use as instructed to monitor blood sugar once daily Nooruddin, Saad, MD  Active   metFORMIN  (GLUCOPHAGE -XR) 500 MG 24 hr tablet [id number] Yes Take 2 tablets (1,000 mg total) by mouth 2 (two) times daily with a meal. [place], DO  Active   Multiple Vitamins-Minerals (HAIR SKIN & NAILS) TABS [id number]  Take 1 tablet by mouth daily. [provider]  Active Self    Discontinued 11/19/24 1429 (Expired  Prescription) ondansetron  (ZOFRAN -ODT) 4 MG disintegrating tablet [id number]  Take 1 tablet (4 mg total) by mouth every 8 (eight) hours as needed for nausea or vomiting. Sponseller, Rebekah R, PA-C  Active   Paliperidone ER (INVEGA SUSTENNA) injection [id number] Yes Inject 117 mg into the muscle every 30 (thirty) days. [provider]  Active Self    Discontinued 11/19/24 1429 (Completed Course)   Semaglutide , 1 MG/DOSE, 4 MG/3ML SOPN [id number] Yes Inject 1 mg  into the skin once a week. Nooruddin, Saad, MD  Active   Med List Note Delsa Lorain SQUIBB, RPH-CPP 09/17/24 1144): Darin Jacquot(son) [phone], patient uses [facility] for pill packs (send oral medications to this pharmacy)              Assessment/Plan:   Diabetes: - Currently controlled with most recent A1C of 6.8% above goal <7% and improved from 8.1% since starting Ozempic . Recommend to conitnue 1 mg weekly, as patient appears to be tolerating well. She is also tolerating SGLT2i, which is indicated for microalbuminuria, especially since she has been unable to tolerate ACEi or ARB.  - Last UACR July 2025 - 191 mg/dL.  - Reviewed long term cardiovascular and renal outcomes of uncontrolled blood sugar - Reviewed goal A1c, goal fasting, and goal 2 hour post prandial glucose - Reviewed dietary modifications including  utilizing the healthy plate method, limiting portion size of carbohydrate foods, increasing intake of protein and non-starchy vegetables. Counseled patient to stay hydrated with water  throughout the day. Discussed avoiding sodas and limiting sweets - Reviewed lifestyle modifications including: aiming for 150 minutes of moderate intensity exercise every week.  - Recommend to continue Ozempic  1 mg weekly  - Recommend to continue metformin  XR 1000 mg BID - Recommend to continue Farxiga  (dapagliflozin ) 10 mg daily. Patient counseled on administration and potential AE associated with SGLT2i including GU infections and  dehydration. Instructed to stay hydrated with water  throughout the day. - Recommend to check glucose once daily fasting a couple times per week. Called [facility] who was able to process Accu Chek glucometer and supplies again for $10 through Medicare Part B ([facility], [facility], and [facility] were unable to do this).  - Next A1C due 12/05/24 - PCP appt scheduled today    Hypertension: - Currently uncontrolled with clinic BP slightly above goal less than 130/80, specifically DBP > 80 mmHg. She denies s/sx of hypotension. If BP continues to be uncontrolled at upcoming appts, could consider addition of low dose hydrochlorothiazide . She does not appear to have an alternative indication for a beta-blocker, other than hypertension. - Reviewed long term cardiovascular and renal outcomes of uncontrolled blood pressure - Reviewed appropriate blood pressure monitoring technique and reviewed goal blood pressure. She has not been able to get a blood pressure cuff through insurance. - Recommend to continue amlodipine  10 mg daily and carvedilol  6.25 mg BID   [facility] pharmacy to discuss timing of pill pack delivery. Was told that pill packs are filled on time, but delivery is sometimes late, as this is handled by nurses with the ACT team. Spoke with nurse on the ACT team, [place], who says patient's last pill packs were delivered on 10/24/24 and are due for delivery again on 11/23/24. Discussed that packs are for 28 days and she would be out. Also discussed that delivery occurs after the start date on the pack which is confusing for the patient. Requested earlier delivery and communication with the pharmacy to better time pack delivery with pack start date.   Written patient instructions provided. Patient verbalized understanding of treatment plan.    Follow Up Plan:  Pharmacist 01/07/25 PCP clinic visit 12/05/24   Lorain Baseman, PharmD [facility] [phone]   "

## 2024-11-19 NOTE — Patient Instructions (Signed)
 It was nice to see you today!  Your goal blood sugar is 80-130 mg/dL before eating and less than 180 mg/dL after eating. Monitor blood sugars at home and keep a log (glucometer or piece of paper) to bring with you to your next visit.  Your goal blood pressure is less than 130/80 mmHg.   Medication Changes: Continue Ozempic  1 mg weekly along with your metformin  and Farxiga  for diabetes.  Continue all other medication the same.   Lifestyle Recommendations:  Aim for 150 minutes of moderate intensity exercise every week. This is any activity that elevates your heart rate and breathing rate, but still allows you to carry on a conversation. Exercising after meals can help prevent spikes in your blood sugar.  Diet Recommendations:   Try to eat 3 real meals using the healthy plate method and 1-2 snacks per day. Never go more than 4-5 hours while awake without eating. Eat breakfast within the first hour of getting up.     Carbohydrates include starch, sugar, and fiber. Sugar and starch raise blood glucose.  Starchy foods include bread, rice, pasta, potatoes, corn, cereal, grits, crackers, bagels, muffins, all baked foods Some fruits are higher in sugar, like grapes, watermelon, oranges, and most tropical fruits. Limit your serving size to 1/2 cup at a time.  Protein foods include meat, fish, poultry, eggs, dairy, and beans (although beans also provide carbohydrates).  Non-starchy vegetables do not impact your blood sugar very much. These include greens, broccoli, asparagus, carrots, cauliflower, cucumber, mushrooms, peppers, yellow squash, zucchini squash, tomato, to name a few!  Avoid all sugary beverages. Stay hydrated with water  throughout the day. Sparkling/flavored water , unsweet tea, black coffee, and zero-sugar or diet drinks will not impact your blood sugar, but they should not be used as your only source of hydration!  You can find more information at  https://diabetes.org/food-nutrition/eating-healthy   Lorain Baseman, PharmD [facility] [phone]

## 2024-12-05 ENCOUNTER — Ambulatory Visit: Payer: Self-pay

## 2024-12-05 VITALS — BP 124/84 | HR 70 | Temp 97.8°F | Ht 64.0 in | Wt 205.2 lb

## 2024-12-05 DIAGNOSIS — I10 Essential (primary) hypertension: Secondary | ICD-10-CM | POA: Diagnosis not present

## 2024-12-05 DIAGNOSIS — Z6835 Body mass index (BMI) 35.0-35.9, adult: Secondary | ICD-10-CM

## 2024-12-05 DIAGNOSIS — E119 Type 2 diabetes mellitus without complications: Secondary | ICD-10-CM

## 2024-12-05 DIAGNOSIS — Z7984 Long term (current) use of oral hypoglycemic drugs: Secondary | ICD-10-CM | POA: Diagnosis not present

## 2024-12-05 DIAGNOSIS — E669 Obesity, unspecified: Secondary | ICD-10-CM | POA: Diagnosis not present

## 2024-12-05 DIAGNOSIS — Z7985 Long-term (current) use of injectable non-insulin antidiabetic drugs: Secondary | ICD-10-CM | POA: Diagnosis not present

## 2024-12-05 MED ORDER — OZEMPIC (2 MG/DOSE) 8 MG/3ML ~~LOC~~ SOPN: 2.0000 mg | PEN_INJECTOR | SUBCUTANEOUS | 3 refills | Status: AC

## 2024-12-05 NOTE — Assessment & Plan Note (Signed)
 Patient's weight has been stable. Increased dose of ozempic  to 2 mg to see if there are any more benefits. If this does not help, can consider switching to mounjaro.

## 2024-12-05 NOTE — Progress Notes (Signed)
 "  Established Patient Office Visit  Subjective   Patient ID: [name], female    DOB: 07/26/1971  Age: 54 y.o. MRN: [id number]  Chief Complaint  Patient presents with   Follow-up    HPI [name] is a 54 year old female with PMH type 2 DM, hypertension, hyperlipidemia, primary cancer of hepatic flexure, congenital hearing loss of both ears that presents today for chronic condition follow up. See problem based plan and assessment for more details.    ROS See problem based plan and assessment for more details.    Objective:     BP 124/84 (BP Location: Right Arm, Patient Position: Sitting, Cuff Size: Small)   Pulse 70   Temp 97.8 F (36.6 C) (Oral)   Ht 5' 4 (1.626 m)   Wt 205 lb 3.2 oz (93.1 kg)   LMP 07/23/2015   SpO2 100%   BMI 35.22 kg/m  BP Readings from Last 3 Encounters:  12/05/24 124/84  11/19/24 128/84  09/17/24 116/85   Wt Readings from Last 3 Encounters:  12/05/24 205 lb 3.2 oz (93.1 kg)  11/19/24 205 lb 6.4 oz (93.2 kg)  08/21/24 201 lb 3.2 oz (91.3 kg)      Physical Exam Constitutional:      General: She is not in acute distress.    Appearance: She is obese.  Cardiovascular:     Rate and Rhythm: Normal rate and regular rhythm.     Heart sounds: No murmur heard. Pulmonary:     Effort: Pulmonary effort is normal. No respiratory distress.     Breath sounds: Normal breath sounds.  Neurological:     Mental Status: She is alert and oriented to person, place, and time.  Psychiatric:        Mood and Affect: Mood normal.      No results found for any visits on 12/05/24.    The ASCVD Risk score (Arnett DK, et al., 2019) failed to calculate for the following reasons:   The valid total cholesterol range is 130 to 320 mg/dL    Assessment & Plan:   Problem List Items Addressed This Visit       Cardiovascular and Mediastinum   Hypertension (Chronic)     Endocrine   Diabetes mellitus type 2, noninsulin dependent (HCC) - Primary  (Chronic)   Relevant Medications   Semaglutide , 2 MG/DOSE, (OZEMPIC , 2 MG/DOSE,) 8 MG/3ML SOPN   Other Relevant Orders   Hemoglobin A1c     Other   Obesity (BMI 30-39.9) (Chronic)   Relevant Medications   Semaglutide , 2 MG/DOSE, (OZEMPIC , 2 MG/DOSE,) 8 MG/3ML SOPN   Assessment & Plan Diabetes mellitus type 2, noninsulin dependent (HCC) Last A1c was 6.8 in October 2025. Will recheck today. Patient is on farxiga  10 mg, metformin  1000 mg 2 times daily. She has been on ozempic  1 mg for several months with no GI side effects. Will increase this dose for better weight loss. Patient reports concern that her pill packs are not being delivered on time. Patient follows with Dr. Brinda. Could consider switching to another pill pack pharmacy in the future.   Plan: Continue metformin  1000mg  BID, farxiga  10 mg Increase ozempic  to 2 mg A1c pending  Encouraged balanced diet as well as daily exercise including strength training Primary hypertension Patient is on amlodipine  10 mg and coreg  6.25 mg BID. Well controlled. No acute concerns  Obesity (BMI 30-39.9) Patient's weight has been stable. Increased dose of ozempic  to 2 mg to see if  there are any more benefits. If this does not help, can consider switching to mounjaro.     Return in about 3 months (around 03/05/2025) for diabetes, HTN .    Sandy Papin D'Mello, DO Patient discussed with Dr. Jeanelle "

## 2024-12-05 NOTE — Assessment & Plan Note (Signed)
 Patient is on amlodipine  10 mg and coreg  6.25 mg BID. Well controlled. No acute concerns

## 2024-12-05 NOTE — Patient Instructions (Signed)
 Today we discussed the following medical conditions and plan:   We will check your A1c today which is for your diabetes. If we need to, we will make some further changes.   Keep doing what you are doing as far as your blood pressure. Continue taking your medications. If you continue having problems getting your medications, let us  know.   Try doing some daily exercise as well. If you do then I think this will help all your health conditions   We look forward to seeing you next time. Please call our clinic at [phone] if you have any questions or concerns. The best time to call is Monday-Friday from 9am-4pm, but there is someone available 24/7. If you need medication refills, please notify your pharmacy one week in advance and they will send us  a request.   Thank you for trusting me with your care. Wishing you the best!   Taurus Alamo D'Mello, DO  [facility]

## 2024-12-05 NOTE — Assessment & Plan Note (Signed)
 Last A1c was 6.8 in October 2025. Will recheck today. Patient is on farxiga  10 mg, metformin  1000 mg 2 times daily. She has been on ozempic  1 mg for several months with no GI side effects. Will increase this dose for better weight loss. Patient reports concern that her pill packs are not being delivered on time. Patient follows with Dr. Brinda. Could consider switching to another pill pack pharmacy in the future.   Plan: Continue metformin  1000mg  BID, farxiga  10 mg Increase ozempic  to 2 mg A1c pending  Encouraged balanced diet as well as daily exercise including strength training

## 2024-12-06 ENCOUNTER — Ambulatory Visit: Payer: Self-pay

## 2024-12-06 LAB — HEMOGLOBIN A1C
Est. average glucose Bld gHb Est-mCnc: 143 mg/dL
Hgb A1c MFr Bld: 6.6 % — ABNORMAL HIGH (ref 4.8–5.6)

## 2024-12-06 NOTE — Progress Notes (Signed)
 Internal Medicine Clinic Attending  Case discussed with the resident at the time of the visit.  We reviewed the resident's history and exam and pertinent patient test results.  I agree with the assessment, diagnosis, and plan of care documented in the resident's note.

## 2024-12-26 ENCOUNTER — Inpatient Hospital Stay

## 2024-12-26 ENCOUNTER — Ambulatory Visit (HOSPITAL_COMMUNITY)

## 2025-01-02 ENCOUNTER — Ambulatory Visit: Admitting: Hematology

## 2025-01-07 ENCOUNTER — Ambulatory Visit: Payer: Self-pay

## 2025-03-05 ENCOUNTER — Ambulatory Visit: Payer: Self-pay
